# Patient Record
Sex: Female | Born: 1975
Health system: Southern US, Community
[De-identification: ages and names within clinical notes are randomized; demographics above are authoritative.]

## PROBLEM LIST (undated history)

## (undated) ENCOUNTER — Emergency Department (HOSPITAL_COMMUNITY): Admission: EM | Payer: Self-pay | Source: Home / Self Care

## (undated) DIAGNOSIS — Z8673 Personal history of transient ischemic attack (TIA), and cerebral infarction without residual deficits: Secondary | ICD-10-CM

## (undated) DIAGNOSIS — Z8679 Personal history of other diseases of the circulatory system: Secondary | ICD-10-CM

## (undated) DIAGNOSIS — E669 Obesity, unspecified: Secondary | ICD-10-CM

## (undated) DIAGNOSIS — D573 Sickle-cell trait: Secondary | ICD-10-CM

## (undated) DIAGNOSIS — I10 Essential (primary) hypertension: Secondary | ICD-10-CM

## (undated) DIAGNOSIS — E7800 Pure hypercholesterolemia, unspecified: Secondary | ICD-10-CM

## (undated) DIAGNOSIS — I639 Cerebral infarction, unspecified: Secondary | ICD-10-CM

## (undated) DIAGNOSIS — R32 Unspecified urinary incontinence: Secondary | ICD-10-CM

## (undated) HISTORY — DX: Personal history of transient ischemic attack (TIA), and cerebral infarction without residual deficits: Z86.73

## (undated) HISTORY — PX: TUBAL LIGATION: SHX77

## (undated) HISTORY — DX: Personal history of other diseases of the circulatory system: Z86.79

## (undated) HISTORY — DX: Unspecified urinary incontinence: R32

## (undated) HISTORY — PX: WISDOM TOOTH EXTRACTION: SHX21

## (undated) HISTORY — DX: Cerebral infarction, unspecified: I63.9

---

## 2000-11-08 ENCOUNTER — Emergency Department (HOSPITAL_COMMUNITY): Admission: EM | Admit: 2000-11-08 | Discharge: 2000-11-08 | Payer: Self-pay | Admitting: Emergency Medicine

## 2001-07-17 ENCOUNTER — Other Ambulatory Visit: Admission: RE | Admit: 2001-07-17 | Discharge: 2001-07-17 | Payer: Self-pay | Admitting: Gynecology

## 2005-07-01 ENCOUNTER — Emergency Department (HOSPITAL_COMMUNITY): Admission: EM | Admit: 2005-07-01 | Discharge: 2005-07-02 | Payer: Self-pay | Admitting: Emergency Medicine

## 2005-08-14 ENCOUNTER — Emergency Department (HOSPITAL_COMMUNITY): Admission: EM | Admit: 2005-08-14 | Discharge: 2005-08-14 | Payer: Self-pay | Admitting: Family Medicine

## 2006-02-12 ENCOUNTER — Emergency Department (HOSPITAL_COMMUNITY): Admission: EM | Admit: 2006-02-12 | Discharge: 2006-02-13 | Payer: Self-pay | Admitting: Emergency Medicine

## 2006-04-19 ENCOUNTER — Emergency Department (HOSPITAL_COMMUNITY): Admission: EM | Admit: 2006-04-19 | Discharge: 2006-04-19 | Payer: Self-pay | Admitting: Family Medicine

## 2006-05-12 ENCOUNTER — Emergency Department (HOSPITAL_COMMUNITY): Admission: EM | Admit: 2006-05-12 | Discharge: 2006-05-12 | Payer: Self-pay | Admitting: Emergency Medicine

## 2006-05-13 ENCOUNTER — Emergency Department (HOSPITAL_COMMUNITY): Admission: EM | Admit: 2006-05-13 | Discharge: 2006-05-13 | Payer: Self-pay | Admitting: Emergency Medicine

## 2006-12-01 ENCOUNTER — Other Ambulatory Visit: Admission: RE | Admit: 2006-12-01 | Discharge: 2006-12-01 | Payer: Self-pay | Admitting: Family Medicine

## 2007-07-29 ENCOUNTER — Emergency Department (HOSPITAL_COMMUNITY): Admission: EM | Admit: 2007-07-29 | Discharge: 2007-07-30 | Payer: Self-pay | Admitting: Emergency Medicine

## 2007-12-14 ENCOUNTER — Other Ambulatory Visit: Admission: RE | Admit: 2007-12-14 | Discharge: 2007-12-14 | Payer: Self-pay | Admitting: Family Medicine

## 2008-03-05 ENCOUNTER — Emergency Department (HOSPITAL_COMMUNITY): Admission: EM | Admit: 2008-03-05 | Discharge: 2008-03-05 | Payer: Self-pay | Admitting: Family Medicine

## 2008-10-06 ENCOUNTER — Emergency Department (HOSPITAL_COMMUNITY): Admission: EM | Admit: 2008-10-06 | Discharge: 2008-10-06 | Payer: Self-pay | Admitting: Family Medicine

## 2009-03-17 ENCOUNTER — Emergency Department (HOSPITAL_COMMUNITY): Admission: EM | Admit: 2009-03-17 | Discharge: 2009-03-17 | Payer: Self-pay | Admitting: Emergency Medicine

## 2010-03-19 ENCOUNTER — Emergency Department (HOSPITAL_COMMUNITY)
Admission: EM | Admit: 2010-03-19 | Discharge: 2010-03-19 | Disposition: A | Discharge status: Home / Self Care | Payer: Worker's Compensation | Attending: Emergency Medicine | Admitting: Emergency Medicine

## 2010-03-19 DIAGNOSIS — W261XXA Contact with sword or dagger, initial encounter: Secondary | ICD-10-CM | POA: Insufficient documentation

## 2010-03-19 DIAGNOSIS — S61209A Unspecified open wound of unspecified finger without damage to nail, initial encounter: Secondary | ICD-10-CM | POA: Insufficient documentation

## 2010-03-19 DIAGNOSIS — W260XXA Contact with knife, initial encounter: Secondary | ICD-10-CM | POA: Insufficient documentation

## 2010-03-19 DIAGNOSIS — D571 Sickle-cell disease without crisis: Secondary | ICD-10-CM | POA: Insufficient documentation

## 2010-03-19 DIAGNOSIS — Y9269 Other specified industrial and construction area as the place of occurrence of the external cause: Secondary | ICD-10-CM | POA: Insufficient documentation

## 2010-03-19 DIAGNOSIS — Y990 Civilian activity done for income or pay: Secondary | ICD-10-CM | POA: Insufficient documentation

## 2010-05-03 LAB — POCT I-STAT, CHEM 8
BUN: 7 mg/dL (ref 6–23)
Calcium, Ion: 1.15 mmol/L (ref 1.12–1.32)
Chloride: 106 mEq/L (ref 96–112)
Creatinine, Ser: 1 mg/dL (ref 0.4–1.2)
HCT: 40 % (ref 36.0–46.0)
Hemoglobin: 13.6 g/dL (ref 12.0–15.0)
Potassium: 3.5 mEq/L (ref 3.5–5.1)
Sodium: 139 mEq/L (ref 135–145)
TCO2: 26 mmol/L (ref 0–100)

## 2010-07-21 ENCOUNTER — Inpatient Hospital Stay (INDEPENDENT_AMBULATORY_CARE_PROVIDER_SITE_OTHER)
Admission: RE | Admit: 2010-07-21 | Discharge: 2010-07-21 | Disposition: A | Discharge status: Home / Self Care | Payer: Self-pay | Source: Ambulatory Visit | Attending: Family Medicine | Admitting: Family Medicine

## 2010-07-21 DIAGNOSIS — J069 Acute upper respiratory infection, unspecified: Secondary | ICD-10-CM

## 2010-07-21 DIAGNOSIS — J309 Allergic rhinitis, unspecified: Secondary | ICD-10-CM

## 2010-11-14 ENCOUNTER — Inpatient Hospital Stay (INDEPENDENT_AMBULATORY_CARE_PROVIDER_SITE_OTHER)
Admission: RE | Admit: 2010-11-14 | Discharge: 2010-11-14 | Disposition: A | Discharge status: Home / Self Care | Payer: Worker's Compensation | Source: Ambulatory Visit | Attending: Family Medicine | Admitting: Family Medicine

## 2010-11-14 ENCOUNTER — Ambulatory Visit (INDEPENDENT_AMBULATORY_CARE_PROVIDER_SITE_OTHER): Payer: Worker's Compensation

## 2010-11-14 DIAGNOSIS — M779 Enthesopathy, unspecified: Secondary | ICD-10-CM

## 2010-11-14 DIAGNOSIS — M79609 Pain in unspecified limb: Secondary | ICD-10-CM

## 2011-01-13 ENCOUNTER — Emergency Department (INDEPENDENT_AMBULATORY_CARE_PROVIDER_SITE_OTHER)
Admission: EM | Admit: 2011-01-13 | Discharge: 2011-01-13 | Disposition: A | Discharge status: Home / Self Care | Payer: BC Managed Care – PPO | Source: Home / Self Care | Attending: Family Medicine | Admitting: Family Medicine

## 2011-01-13 DIAGNOSIS — J069 Acute upper respiratory infection, unspecified: Secondary | ICD-10-CM

## 2011-01-13 MED ORDER — GUAIFENESIN-CODEINE 100-10 MG/5ML PO SYRP: 5.0000 mL | ORAL_SOLUTION | Freq: Four times a day (QID) | ORAL | Status: AC | PRN

## 2011-01-13 MED ORDER — AZITHROMYCIN 250 MG PO TABS: 250.0000 mg | ORAL_TABLET | Freq: Every day | ORAL | Status: AC

## 2011-01-13 NOTE — ED Notes (Signed)
C/o productive cough of yellow-green sputum, sore throat and runny nose for 2 weeks.  States she thinks she has had intermittent fever.

## 2011-01-13 NOTE — ED Provider Notes (Signed)
History     CSN: [id number] Arrival date & time: 01/13/2011  8:47 AM   First MD Initiated Contact with Patient 01/13/11 [phone]      Chief Complaint  Patient presents with  . Cough    (Consider location/radiation/quality/duration/timing/severity/associated sxs/prior treatment) HPI Comments: Belinda Ramirez presents for evaluation of persistent nasal congestion, rhinorrhea, and cough. She is unsure if she has had fever but does report feeling hot and then chills. She has tried Sudafed without relief.   Patient is a 35 y.o. female presenting with cough. The history is provided by the patient.  Cough This is a new problem. The current episode started more than 1 week ago. The problem occurs constantly. The problem has not changed since onset.The cough is productive of sputum. The maximum temperature recorded prior to her arrival was 100 to 100.9 F. The fever has been present for 5 days or more. Associated symptoms include chills, rhinorrhea, sore throat and myalgias. Pertinent negatives include no ear congestion, no ear pain and no eye redness. She has tried decongestants for the symptoms. The treatment provided no relief.    History reviewed. No pertinent past medical history.  Past Surgical History  Procedure Date  . Tubal ligation     No family history on file.  History  Substance Use Topics  . Smoking status: Never Smoker   . Smokeless tobacco: Not on file  . Alcohol Use: No    OB History    Grav Para Term Preterm Abortions TAB SAB Ect Mult Living                  Review of Systems  Constitutional: Positive for fever, chills and fatigue.  HENT: Positive for congestion, sore throat and rhinorrhea. Negative for ear pain and trouble swallowing.   Eyes: Negative.  Negative for redness.  Respiratory: Positive for cough.   Cardiovascular: Negative.   Gastrointestinal: Negative.   Genitourinary: Negative.   Musculoskeletal: Positive for myalgias.  Skin: Negative.      Allergies  Review of patient's allergies indicates no known allergies.  Home Medications   Current Outpatient Rx  Name Route Sig Dispense Refill  . TYLENOL PO Oral Take by mouth as needed.      . SUDAFED 12 HOUR PO Oral Take by mouth as needed.        BP 137/99  Pulse 77  Temp(Src) 98.9 F (37.2 C) (Oral)  Resp 18  SpO2 100%  LMP 12/30/2010  Physical Exam  Constitutional: She is oriented to person, place, and time. She appears well-developed and well-nourished.  HENT:  Head: Normocephalic and atraumatic.  Right Ear: Tympanic membrane and external ear normal.  Left Ear: Tympanic membrane and external ear normal.  Nose: Nose normal.  Mouth/Throat: Uvula is midline, oropharynx is clear and moist and mucous membranes are normal.  Eyes: EOM are normal. Pupils are equal, round, and reactive to light.  Neck: Normal range of motion.  Cardiovascular: Normal rate and regular rhythm.   Pulmonary/Chest: Effort normal and breath sounds normal.  Neurological: She is alert and oriented to person, place, and time.  Skin: Skin is warm and dry.    ED Course  Procedures (including critical care time)  Labs Reviewed - No data to display No results found.   No diagnosis found.    MDM          Marcell Anger, MD 01/13/11 [phone]

## 2011-05-23 ENCOUNTER — Emergency Department (INDEPENDENT_AMBULATORY_CARE_PROVIDER_SITE_OTHER)
Admission: EM | Admit: 2011-05-23 | Discharge: 2011-05-23 | Disposition: A | Discharge status: Home / Self Care | Payer: BC Managed Care – PPO | Source: Home / Self Care | Attending: Family Medicine | Admitting: Family Medicine

## 2011-05-23 ENCOUNTER — Encounter (HOSPITAL_COMMUNITY): Payer: Self-pay | Admitting: *Deleted

## 2011-05-23 DIAGNOSIS — R030 Elevated blood-pressure reading, without diagnosis of hypertension: Secondary | ICD-10-CM

## 2011-05-23 DIAGNOSIS — A0811 Acute gastroenteropathy due to Norwalk agent: Secondary | ICD-10-CM

## 2011-05-23 MED ORDER — ONDANSETRON 8 MG PO TBDP: 8.0000 mg | ORAL_TABLET | Freq: Three times a day (TID) | ORAL | Status: AC | PRN

## 2011-05-23 MED ORDER — BISMUTH SUBSALICYLATE 262 MG/15ML PO SUSP: 15.0000 mL | Freq: Four times a day (QID) | ORAL | Status: AC | PRN

## 2011-05-23 MED ORDER — ONDANSETRON 4 MG PO TBDP
ORAL_TABLET | ORAL | Status: AC
  Filled 2011-05-23: qty 2

## 2011-05-23 MED ORDER — RANITIDINE HCL 150 MG PO CAPS: 150.0000 mg | ORAL_CAPSULE | Freq: Every day | ORAL | Status: DC

## 2011-05-23 MED ORDER — DIPHENOXYLATE-ATROPINE 2.5-0.025 MG PO TABS: 1.0000 | ORAL_TABLET | Freq: Four times a day (QID) | ORAL | Status: AC | PRN

## 2011-05-23 MED ORDER — ONDANSETRON 4 MG PO TBDP
8.0000 mg | ORAL_TABLET | Freq: Once | ORAL | Status: AC
  Administered 2011-05-23: 8 mg via ORAL

## 2011-05-23 NOTE — ED Notes (Signed)
CO DIARRHEA, VOMITING LAST THIS AM AT 0800, DENIES FEVER , CO ABD CRAMPS

## 2011-05-23 NOTE — ED Provider Notes (Signed)
History     CSN: [id number]  Arrival date & time 05/23/11  1340   First MD Initiated Contact with Patient 05/23/11 1341      Chief Complaint  Patient presents with  . Diarrhea    (Consider location/radiation/quality/duration/timing/severity/associated sxs/prior treatment) Patient is a 36 y.o. female presenting with diarrhea. The history is provided by the patient.  Diarrhea The primary symptoms include fatigue, abdominal pain, nausea, vomiting, diarrhea, myalgias and arthralgias. Primary symptoms do not include fever, weight loss, melena, hematemesis, jaundice, hematochezia, dysuria or rash. The illness began 2 days ago. The problem has been gradually worsening.  The illness is also significant for chills and bloating. The illness does not include dysphagia, constipation, back pain or itching. Associated medical issues do not include inflammatory bowel disease, GERD, gallstones, liver disease, PUD, gastric bypass, bowel resection, irritable bowel syndrome, hemorrhoids or diverticulitis.    History reviewed. No pertinent past medical history.  Past Surgical History  Procedure Date  . Tubal ligation     History reviewed. No pertinent family history.  History  Substance Use Topics  . Smoking status: Never Smoker   . Smokeless tobacco: Not on file  . Alcohol Use: No    OB History    Grav Para Term Preterm Abortions TAB SAB Ect Mult Living                  Review of Systems  Constitutional: Positive for chills and fatigue. Negative for fever and weight loss.  Gastrointestinal: Positive for nausea, vomiting, abdominal pain, diarrhea and bloating. Negative for dysphagia, constipation, melena, hematochezia, hematemesis and jaundice.  Genitourinary: Negative for dysuria.  Musculoskeletal: Positive for myalgias and arthralgias. Negative for back pain.  Skin: Negative for itching and rash.    Allergies  Review of patient's allergies indicates no known allergies.  Home  Medications   Current Outpatient Rx  Name Route Sig Dispense Refill  . TYLENOL PO Oral Take by mouth as needed.      . SUDAFED 12 HOUR PO Oral Take by mouth as needed.        BP 174/94  Pulse 70  Temp(Src) 98.5 F (36.9 C) (Oral)  Resp 18  SpO2 98%  LMP 05/05/2011  Physical Exam  Constitutional: She is oriented to person, place, and time. She appears well-developed and well-nourished.       Obese BF  HENT:  Head: Normocephalic.  Cardiovascular: Normal rate and regular rhythm.   Pulmonary/Chest: Effort normal and breath sounds normal.  Abdominal: Soft. Bowel sounds are normal. She exhibits no distension and no mass. There is no tenderness. There is no rebound and no guarding.  Neurological: She is alert and oriented to person, place, and time. No cranial nerve deficit.  Skin: Skin is warm.  Psychiatric: She has a normal mood and affect.    ED Course  Procedures (including critical care time)  Gastroenteritis  noro virus zofran , lomotil, peptobismol w/zantac     MDM          Frederich Cha, MD 05/23/11 2122

## 2011-05-23 NOTE — Discharge Instructions (Signed)
Hypertension Information As your heart beats, it forces blood through your arteries. This force is your blood pressure. If the pressure is too high, it is called hypertension (HTN) or high blood pressure. HTN is dangerous because you may have it and not know it. High blood pressure may mean that your heart has to work harder to pump blood. Your arteries may be narrow or stiff. The extra work puts you at risk for heart disease, stroke, and other problems.  Blood pressure consists of two numbers, a higher number over a lower, 110/72, for example. It is stated as "110 over 72." The ideal is below 120 for the top number (systolic) and under 80 for the bottom (diastolic).  You should pay close attention to your blood pressure if you have certain conditions such as:  Heart failure.   Prior heart attack.   Diabetes   Chronic kidney disease.   Prior stroke.   Multiple risk factors for heart disease.  To see if you have HTN, your blood pressure should be measured while you are seated with your arm held at the level of the heart. It should be measured at least twice. A one-time elevated blood pressure reading (especially in the Emergency Department) does not mean that you need treatment. There may be conditions in which the blood pressure is different between your right and left arms. It is important to see your caregiver soon for a recheck. Most people have essential hypertension which means that there is not a specific cause. This type of high blood pressure may be lowered by changing lifestyle factors such as:  Stress.   Smoking.   Lack of exercise.   Excessive weight.   Drug/tobacco/alcohol use.   Eating less salt.  Most people do not have symptoms from high blood pressure until it has caused damage to the body. Effective treatment can often prevent, delay or reduce that damage. TREATMENT  Treatment for high blood pressure, when a cause has been identified, is directed at the cause. There  are a large number of medications to treat HTN. These fall into several categories, and your caregiver will help you select the medicines that are best for you. Medications may have side effects. You should review side effects with your caregiver. If your blood pressure stays high after you have made lifestyle changes or started on medicines,   Your medication(s) may need to be changed.   Other problems may need to be addressed.   Be certain you understand your prescriptions, and know how and when to take your medicine.   Be sure to follow up with your caregiver within the time frame advised (usually within two weeks) to have your blood pressure rechecked and to review your medications.   If you are taking more than one medicine to lower your blood pressure, make sure you know how and at what times they should be taken. Taking two medicines at the same time can result in blood pressure that is too low.  Document Released: 04/06/2005 Document Revised: 10/14/2010 Document Reviewed: 04/13/2007 [facility] Patient Information 2012 [place].Viral Gastroenteritis Viral gastroenteritis is also called stomach flu. This illness is caused by a certain type of germ (virus). It can cause sudden watery poop (diarrhea) and throwing up (vomiting). This can cause you to lose body fluids (dehydration). This illness usually lasts for 3 to 8 days. It usually goes away on its own. HOME CARE   Drink enough fluids to keep your pee (urine) clear or pale yellow.  Drink small amounts of fluids often.   Ask your doctor how to replace body fluid losses (rehydration).   Avoid:   Foods high in sugar.   Alcohol.   Bubbly (carbonated) drinks.   Tobacco.   Juice.   Caffeine drinks.   Very hot or cold fluids.   Fatty, greasy foods.   Eating too much at one time.   Dairy products until 24 to 48 hours after your watery poop stops.   You may eat foods with active cultures (probiotics). They can be found in  some yogurts and supplements.   Wash your hands well to avoid spreading the illness.   Only take medicines as told by your doctor. Do not give aspirin to children. Do not take medicines for watery poop (antidiarrheals).   Ask your doctor if you should keep taking your regular medicines.   Keep all doctor visits as told.  GET HELP RIGHT AWAY IF:   You cannot keep fluids down.   You do not pee at least once every 6 to 8 hours.   You are short of breath.   You see blood in your poop or throw up. This may look like coffee grounds.   You have belly (abdominal) pain that gets worse or is just in one small spot (localized).   You keep throwing up or having watery poop.   You have a fever.   The patient is a child younger than 3 months, and he or she has a fever.   The patient is a child older than 3 months, and he or she has a fever and problems that do not go away.   The patient is a child older than 3 months, and he or she has a fever and problems that suddenly get worse.   The patient is a baby, and he or she has no tears when crying.  MAKE SURE YOU:   Understand these instructions.   Will watch your condition.   Will get help right away if you are not doing well or get worse.  Document Released: 07/21/2007 Document Revised: 01/21/2011 Document Reviewed: 11/18/2010 [facility] Patient Information 2012 [place].Norovirus Infection Norovirus illness is caused by a viral infection. The term norovirus refers to a group of viruses. Any of those viruses can cause norovirus illness. This illness is often referred to by other names such as viral gastroenteritis, stomach flu, and food poisoning. Anyone can get a norovirus infection. People can have the illness multiple times during their lifetime. CAUSES  Norovirus is found in the stool or vomit of infected people. It is easily spread from person to person (contagious). People with norovirus are contagious from the moment they begin  feeling ill. They may remain contagious for as long as 3 days to 2 weeks after recovery. People can become infected with the virus in several ways. This includes:  Eating food or drinking liquids that are contaminated with norovirus.   Touching surfaces or objects contaminated with norovirus, and then placing your hand in your mouth.   Having direct contact with a person who is infected and shows symptoms. This may occur while caring for someone with illness or while sharing foods or eating utensils with someone who is ill.  SYMPTOMS  Symptoms usually begin 1 to 2 days after ingestion of the virus. Symptoms may include:  Nausea.   Vomiting.   Diarrhea.   Stomach cramps.   Low-grade fever.   Chills.   Headache.   Muscle aches.  Tiredness.  Most people with norovirus illness get better within 1 to 2 days. Some people become dehydrated because they cannot drink enough liquids to replace those lost from vomiting and diarrhea. This is especially true for young children, the elderly, and others who are unable to care for themselves. DIAGNOSIS  Diagnosis is based on your symptoms and exam. Currently, only state public health laboratories have the ability to test for norovirus in stool or vomit. TREATMENT  No specific treatment exists for norovirus infections. No vaccine is available to prevent infections. Norovirus illness is usually brief in healthy people. If you are ill with vomiting and diarrhea, you should drink enough water and fluids to keep your urine clear or pale yellow. Dehydration is the most serious health effect that can result from this infection. By drinking oral rehydration solution (ORS), people can reduce their chance of becoming dehydrated. There are many commercially available pre-made and powdered ORS designed to safely rehydrate people. These may be recommended by your caregiver. Replace any new fluid losses from diarrhea or vomiting with ORS as follows:  If your  child weighs 10 kg or less (22 lb or less), give 60 to 120 ml ( to  cup or 2 to 4 oz) of ORS for each diarrheal stool or vomiting episode.   If your child weighs more than 10 kg (more than 22 lb), give 120 to 240 ml ( to 1 cup or 4 to 8 oz) of ORS for each diarrheal stool or vomiting episode.  HOME CARE INSTRUCTIONS   Follow all your caregiver's instructions.   Avoid sugar-free and alcoholic drinks while ill.   Only take over-the-counter or prescription medicines for pain, vomiting, diarrhea, or fever as directed by your caregiver.  You can decrease your chances of coming in contact with norovirus or spreading it by following these steps:  Frequently wash your hands, especially after using the toilet, changing diapers, and before eating or preparing food.   Carefully wash fruits and vegetables. Cook shellfish before eating them.   Do not prepare food for others while you are infected and for at least 3 days after recovering from illness.   Thoroughly clean and disinfect contaminated surfaces immediately after an episode of illness using a bleach-based household cleaner.   Immediately remove and wash clothing or linens that may be contaminated with the virus.   Use the toilet to dispose of any vomit or stool. Make sure the surrounding area is kept clean.   Food that may have been contaminated by an ill person should be discarded.  SEEK IMMEDIATE MEDICAL CARE IF:   You develop symptoms of dehydration that do not improve with fluid replacement. This may include:   Excessive sleepiness.   Lack of tears.   Dry mouth.   Dizziness when standing.   Weak pulse.  Document Released: 04/24/2002 Document Revised: 01/21/2011 Document Reviewed: 05/26/2009 [facility] Patient Information 2012 [place].

## 2011-11-22 ENCOUNTER — Encounter (HOSPITAL_COMMUNITY): Payer: Self-pay | Admitting: Emergency Medicine

## 2011-11-22 ENCOUNTER — Emergency Department (HOSPITAL_COMMUNITY): Payer: BC Managed Care – PPO

## 2011-11-22 ENCOUNTER — Emergency Department (HOSPITAL_COMMUNITY)
Admission: EM | Admit: 2011-11-22 | Discharge: 2011-11-22 | Disposition: A | Discharge status: Home / Self Care | Payer: BC Managed Care – PPO | Attending: Emergency Medicine | Admitting: Emergency Medicine

## 2011-11-22 DIAGNOSIS — I10 Essential (primary) hypertension: Secondary | ICD-10-CM | POA: Insufficient documentation

## 2011-11-22 DIAGNOSIS — R42 Dizziness and giddiness: Secondary | ICD-10-CM | POA: Insufficient documentation

## 2011-11-22 DIAGNOSIS — I517 Cardiomegaly: Secondary | ICD-10-CM | POA: Insufficient documentation

## 2011-11-22 DIAGNOSIS — R0602 Shortness of breath: Secondary | ICD-10-CM | POA: Insufficient documentation

## 2011-11-22 DIAGNOSIS — Z79899 Other long term (current) drug therapy: Secondary | ICD-10-CM | POA: Insufficient documentation

## 2011-11-22 DIAGNOSIS — R079 Chest pain, unspecified: Secondary | ICD-10-CM | POA: Insufficient documentation

## 2011-11-22 HISTORY — DX: Essential (primary) hypertension: I10

## 2011-11-22 HISTORY — DX: Sickle-cell trait: D57.3

## 2011-11-22 HISTORY — DX: Pure hypercholesterolemia, unspecified: E78.00

## 2011-11-22 LAB — BASIC METABOLIC PANEL
CO2: 26 mEq/L (ref 19–32)
Creatinine, Ser: 0.88 mg/dL (ref 0.50–1.10)
Glucose, Bld: 93 mg/dL (ref 70–99)
Potassium: 3.9 mEq/L (ref 3.5–5.1)
Sodium: 138 mEq/L (ref 135–145)

## 2011-11-22 LAB — CBC
Hemoglobin: 12.5 g/dL (ref 12.0–15.0)
MCH: 26.3 pg (ref 26.0–34.0)

## 2011-11-22 LAB — POCT I-STAT TROPONIN I: Troponin i, poc: 0 ng/mL (ref 0.00–0.08)

## 2011-11-22 MED ORDER — ASPIRIN 81 MG PO CHEW
324.0000 mg | CHEWABLE_TABLET | Freq: Once | ORAL | Status: AC
  Administered 2011-11-22: 324 mg via ORAL
  Filled 2011-11-22: qty 4

## 2011-11-22 NOTE — ED Provider Notes (Signed)
History     CSN: [id number]  Arrival date & time 11/22/11  48   First MD Initiated Contact with Patient 11/22/11 1940      Chief Complaint  Patient presents with  . Chest Pain     Patient is a 36 y.o. female presenting with chest pain. The history is provided by the patient.  Chest Pain Episode onset: several weeks ago. Episode Length: around a minute. Chest pain occurs intermittently. Associated with: nothing. The quality of the pain is described as sharp ("like pins"). The pain does not radiate. Chest pain is worsened by certain positions. Primary symptoms include shortness of breath and dizziness. Pertinent negatives for primary symptoms include no fever, no syncope, no cough, no abdominal pain and no vomiting.  Dizziness does not occur with vomiting or diaphoresis.  Pertinent negatives for associated symptoms include no diaphoresis.   pt reports she has been having intermittent episodes of CP for past several weeks She reports they feel "like pins and sharp" They are around a minute in duration They are at times positional Not pleuritic No exertional pain is reported She does report some mild dizziness but no syncope No exertional weakness/SOB is reported  No h/o CAD No h/o DVT/PE No recent travel/surgery  Currently she is pain free    Past Medical History  Diagnosis Date  . Hypertension   . High cholesterol   . Sickle cell trait     Past Surgical History  Procedure Date  . Tubal ligation     Family history - CAD in family members  History  Substance Use Topics  . Smoking status: Never Smoker   . Smokeless tobacco: Not on file  . Alcohol Use: Yes     occaisonal    OB History    Grav Para Term Preterm Abortions TAB SAB Ect Mult Living                  Review of Systems  Constitutional: Negative for fever and diaphoresis.  Respiratory: Positive for shortness of breath. Negative for cough.   Cardiovascular: Positive for chest pain. Negative for  syncope.  Gastrointestinal: Negative for vomiting and abdominal pain.  Neurological: Positive for dizziness.  All other systems reviewed and are negative.    Allergies  Review of patient's allergies indicates no known allergies.  Home Medications   Current Outpatient Rx  Name Route Sig Dispense Refill  . HYDROCHLOROTHIAZIDE 25 MG PO TABS Oral Take 25 mg by mouth daily.      BP 179/92  Pulse 71  Temp 98.9 F (37.2 C) (Oral)  Resp 20  SpO2 100%  LMP 11/08/2011  Physical Exam CONSTITUTIONAL: Well developed/well nourished HEAD AND FACE: Normocephalic/atraumatic EYES: EOMI/PERRL ENMT: Mucous membranes moist NECK: supple no meningeal signs SPINE:entire spine nontender CV: S1/S2 noted, no murmurs/rubs/gallops noted Chest -nontender, no crepitance LUNGS: Lungs are clear to auscultation bilaterally, no apparent distress ABDOMEN: soft, nontender, no rebound or guarding GU:no cva tenderness NEURO: Pt is awake/alert, moves all extremitiesx4 EXTREMITIES: pulses normal, full ROM, no edema and no calf tenderness SKIN: warm, color normal PSYCH: no abnormalities of mood noted  ED Course  Procedures   Labs Reviewed  CBC - Abnormal; Notable for the following:    MCV 76.8 (*)     RDW 16.1 (*)     All other components within normal limits  BASIC METABOLIC PANEL - Abnormal; Notable for the following:    GFR calc non Af Amer 82 (*)  All other components within normal limits  POCT I-STAT TROPONIN I  POCT I-STAT TROPONIN I   Dg Chest 2 View  11/22/2011  *RADIOLOGY REPORT*  Clinical Data: Chest pain  CHEST - 2 VIEW  Comparison: None.  Findings: Moderate cardiomegaly.  Bibasilar subsegmental atelectasis.  Pulmonary vascularity within normal limits.  No pneumothorax or pleural effusion.  No sign of pulmonary edema.  IMPRESSION: Cardiomegaly without pulmonary edema.   Original Report Authenticated By: Jamas Lav, M.D.    Pt well appearing no distress. HEART Score less than 3 and  two negative troponins.  She had atypical CP story.  I ambulated patient and she had no recurrent chest pain/weakness I doubt ACS/PE/Dissection.  Cardiomegaly noted though unlikely acute, doubt pericarditis/myocarditis/effusion.  She is hypertensive and suspect that may be cause of her mild dizziness (but no weakness, no vertigo reported, no syncope) She needs better BP control and referred back to her PCP this week for close followup.   Discussed strict return precautions and she was agreeable.  This includes worsened/severe chest pain with shortness of breath over next 24-48 hours.    MDM  Nursing notes including past medical history and social history reviewed and considered in documentation xrays reviewed and considered Labs/vital reviewed and considered     Date: 11/22/2011 1536  Rate: 84  Rhythm: normal sinus rhythm  QRS Axis: normal  Intervals: normal  ST/T Wave abnormalities: nonspecific ST changes  Conduction Disutrbances:none       Date: 11/22/2011 2045  Rate: 69  Rhythm: normal sinus rhythm  QRS Axis: normal  Intervals: normal  ST/T Wave abnormalities: normal  Conduction Disutrbances:none     Sharyon Cable, MD 11/23/11 0002

## 2011-11-22 NOTE — ED Notes (Signed)
Clean catch urine sample collected.  No orders for urine at this time.

## 2011-11-22 NOTE — ED Notes (Signed)
Pt c/o mid chest pain st"s feels like a pin sticking her.  St's she had same pain 2 weeks ago and it got better but returned 2 days ago.  Pt st's pain increases if she yells.  Pt denies any nausea or vomiting.

## 2012-06-08 ENCOUNTER — Encounter (HOSPITAL_COMMUNITY): Payer: Self-pay | Admitting: *Deleted

## 2012-06-08 ENCOUNTER — Emergency Department (INDEPENDENT_AMBULATORY_CARE_PROVIDER_SITE_OTHER)
Admission: EM | Admit: 2012-06-08 | Discharge: 2012-06-08 | Disposition: A | Discharge status: Home / Self Care | Payer: BC Managed Care – PPO | Source: Home / Self Care | Attending: Emergency Medicine | Admitting: Emergency Medicine

## 2012-06-08 DIAGNOSIS — J019 Acute sinusitis, unspecified: Secondary | ICD-10-CM

## 2012-06-08 DIAGNOSIS — J209 Acute bronchitis, unspecified: Secondary | ICD-10-CM

## 2012-06-08 MED ORDER — AMOXICILLIN 500 MG PO CAPS: 1000.0000 mg | ORAL_CAPSULE | Freq: Three times a day (TID) | ORAL | Status: DC

## 2012-06-08 MED ORDER — ALBUTEROL SULFATE HFA 108 (90 BASE) MCG/ACT IN AERS: 1.0000 | INHALATION_SPRAY | Freq: Four times a day (QID) | RESPIRATORY_TRACT | Status: DC | PRN

## 2012-06-08 MED ORDER — FLUTICASONE PROPIONATE 50 MCG/ACT NA SUSP: 2.0000 | Freq: Every day | NASAL | Status: DC

## 2012-06-08 MED ORDER — BENZONATATE 200 MG PO CAPS: 200.0000 mg | ORAL_CAPSULE | Freq: Three times a day (TID) | ORAL | Status: DC | PRN

## 2012-06-08 NOTE — ED Provider Notes (Signed)
Chief Complaint:   Chief Complaint  Patient presents with  . Cough    History of Present Illness:   Belinda Ramirez is a 37 year old female who has had a two-week history of nasal congestion with yellow drainage, headache, sinus pressure, ringing in her ear is, cough productive yellow sputum, wheezing, chest pain, sweating, and sore throat. She denies fever, chills, or GI symptoms.  Review of Systems:  Other than noted above, the patient denies any of the following symptoms: Systemic:  No fevers, chills, sweats, weight loss or gain, fatigue, or tiredness. Eye:  No redness or discharge. ENT:  No ear pain, drainage, headache, nasal congestion, drainage, sinus pressure, difficulty swallowing, or sore throat. Neck:  No neck pain or swollen glands. Lungs:  No cough, sputum production, hemoptysis, wheezing, chest tightness, shortness of breath or chest pain. GI:  No abdominal pain, nausea, vomiting or diarrhea.  [place]:  Past medical history, family history, social history, meds, and allergies were reviewed. She has high blood pressure and takes medication for this.  Physical Exam:   Vital signs:  BP 180/98  Pulse 72  Temp(Src) 98.6 F (37 C) (Oral)  Resp 20  SpO2 100%  LMP 05/16/2012 General:  Alert and oriented.  In no distress.  Skin warm and dry. Eye:  No conjunctival injection or drainage. Lids were normal. ENT:  TMs and canals were normal, without erythema or inflammation.  Nasal mucosa was clear and uncongested, without drainage.  Mucous membranes were moist.  Pharynx was clear with no exudate or drainage.  There were no oral ulcerations or lesions. Neck:  Supple, no adenopathy, tenderness or mass. Lungs:  No respiratory distress.  Lungs were clear to auscultation, without wheezes, rales or rhonchi.  Breath sounds were clear and equal bilaterally.  Heart:  Regular rhythm, without gallops, murmers or rubs. Skin:  Clear, warm, and dry, without rash or lesions.   Assessment:  The  primary encounter diagnosis was Acute sinusitis. A diagnosis of Acute bronchitis was also pertinent to this visit.  Plan:   1.  The following meds were prescribed:   Discharge Medication List as of 06/08/2012  5:15 PM    START taking these medications   Details  albuterol (PROVENTIL HFA;VENTOLIN HFA) 108 (90 BASE) MCG/ACT inhaler Inhale 1-2 puffs into the lungs every 6 (six) hours as needed for wheezing., Starting 06/08/2012, Until Discontinued, Normal    amoxicillin (AMOXIL) 500 MG capsule Take 2 capsules (1,000 mg total) by mouth 3 (three) times daily., Starting 06/08/2012, Until Discontinued, Normal    benzonatate (TESSALON) 200 MG capsule Take 1 capsule (200 mg total) by mouth 3 (three) times daily as needed for cough., Starting 06/08/2012, Until Discontinued, Normal    fluticasone (FLONASE) 50 MCG/ACT nasal spray Place 2 sprays into the nose daily., Starting 06/08/2012, Until Discontinued, Normal       2.  The patient was instructed in symptomatic care and handouts were given. 3.  The patient was told to return if becoming worse in any way, if no better in 3 or 4 days, and given some red flag symptoms such as worsening pain, difficulty breathing, or high fever that would indicate earlier return.      Harden Mo, MD 06/08/12 2003

## 2012-06-08 NOTE — ED Notes (Signed)
Pt   Has  Symptoms    Of    Sinus  Congestion  With   Sinus  Drainage         Stuffy  Nose   X  2  Weeks              She  Reports  Symptoms  Not  releived  By otc  meds

## 2012-10-23 ENCOUNTER — Encounter (HOSPITAL_COMMUNITY): Payer: Self-pay | Admitting: Emergency Medicine

## 2012-10-23 ENCOUNTER — Emergency Department (HOSPITAL_COMMUNITY): Payer: BC Managed Care – PPO

## 2012-10-23 ENCOUNTER — Emergency Department (HOSPITAL_COMMUNITY)
Admission: EM | Admit: 2012-10-23 | Discharge: 2012-10-24 | Disposition: A | Discharge status: Home / Self Care | Payer: BC Managed Care – PPO | Attending: Emergency Medicine | Admitting: Emergency Medicine

## 2012-10-23 ENCOUNTER — Emergency Department (INDEPENDENT_AMBULATORY_CARE_PROVIDER_SITE_OTHER)
Admission: EM | Admit: 2012-10-23 | Discharge: 2012-10-23 | Disposition: A | Discharge status: Nursing Home (Includes ICF) | Payer: BC Managed Care – PPO | Source: Home / Self Care | Attending: Emergency Medicine | Admitting: Emergency Medicine

## 2012-10-23 ENCOUNTER — Emergency Department (INDEPENDENT_AMBULATORY_CARE_PROVIDER_SITE_OTHER): Payer: BC Managed Care – PPO

## 2012-10-23 ENCOUNTER — Encounter (HOSPITAL_COMMUNITY): Payer: Self-pay | Admitting: Nurse Practitioner

## 2012-10-23 DIAGNOSIS — Z3202 Encounter for pregnancy test, result negative: Secondary | ICD-10-CM | POA: Insufficient documentation

## 2012-10-23 DIAGNOSIS — R0789 Other chest pain: Secondary | ICD-10-CM

## 2012-10-23 DIAGNOSIS — R7989 Other specified abnormal findings of blood chemistry: Secondary | ICD-10-CM

## 2012-10-23 DIAGNOSIS — R799 Abnormal finding of blood chemistry, unspecified: Secondary | ICD-10-CM | POA: Insufficient documentation

## 2012-10-23 DIAGNOSIS — Z8639 Personal history of other endocrine, nutritional and metabolic disease: Secondary | ICD-10-CM | POA: Insufficient documentation

## 2012-10-23 DIAGNOSIS — Z79899 Other long term (current) drug therapy: Secondary | ICD-10-CM | POA: Insufficient documentation

## 2012-10-23 DIAGNOSIS — R079 Chest pain, unspecified: Secondary | ICD-10-CM

## 2012-10-23 DIAGNOSIS — Z862 Personal history of diseases of the blood and blood-forming organs and certain disorders involving the immune mechanism: Secondary | ICD-10-CM | POA: Insufficient documentation

## 2012-10-23 DIAGNOSIS — M7989 Other specified soft tissue disorders: Secondary | ICD-10-CM | POA: Insufficient documentation

## 2012-10-23 DIAGNOSIS — I10 Essential (primary) hypertension: Secondary | ICD-10-CM | POA: Insufficient documentation

## 2012-10-23 LAB — D-DIMER, QUANTITATIVE: D-Dimer, Quant: 0.54 ug/mL-FEU — ABNORMAL HIGH (ref 0.00–0.48)

## 2012-10-23 MED ORDER — IOHEXOL 350 MG/ML SOLN
100.0000 mL | Freq: Once | INTRAVENOUS | Status: AC | PRN
  Administered 2012-10-23: 100 mL via INTRAVENOUS

## 2012-10-23 NOTE — ED Provider Notes (Signed)
Chief Complaint:   Chief Complaint  Patient presents with  . Chest Pain    x2 days.     History of Present Illness:   Belinda Ramirez is a 37 year old female with high blood pressure who has experienced a two-month history of intermittent episodes of chest pain. The pains are substernal, feels like pins sticking in her chest and lasts from 15-20 minutes. They're brought on by heavy lifting and made better by massaging her chest. She denies any associated nausea, diaphoresis, or shortness of breath. She has felt a little bit of dizziness. She denies any syncope. She's had no fever, chills, URI symptoms, coughing, or wheezing. She denies any palpitations or syncope. She's had no abdominal pain. She denies any leg pain, swelling, or history of phlebitis or pulmonary embolism. She has high blood pressure which is poorly controlled on her current meds which just consisted of hydrochlorothiazide. She denies any history of cigarette smoking, hypercholesterolemia, diabetes, or family history of heart disease, blood clots, phlebitis, or pulmonary embolism. She's had no recent prolonged car or train trips or plane flights.  Review of Systems:  Other than noted above, the patient denies any of the following symptoms. Systemic:  No fever, chills, sweats, or fatigue. ENT:  No nasal congestion, rhinorrhea, or sore throat. Pulmonary:  No cough, wheezing, shortness of breath, sputum production, hemoptysis. Cardiac:  No palpitations, rapid heartbeat, dizziness, presyncope or syncope. GI:  No abdominal pain, heartburn, nausea, or vomiting. Ext:  No leg pain or swelling.  [place]:  Past medical history, family history, social history, meds, and allergies were reviewed and updated as needed. She takes hydrochlorothiazide for high blood pressure  Physical Exam:   Vital signs:  BP 170/95  Pulse 78  Temp(Src) 99.9 F (37.7 C) (Oral)  Resp 18  SpO2 99%  LMP 10/22/2012 Gen:  Alert, oriented, in no distress, skin  warm and dry. Eye:  PERRL, lids and conjunctivas normal.  Sclera non-icteric. ENT:  Mucous membranes moist, pharynx clear. Neck:  Supple, no adenopathy or tenderness.  No JVD. Lungs:  Clear to auscultation, no wheezes, rales or rhonchi.  No respiratory distress. Heart:  Regular rhythm.  No gallops, murmers, clicks or rubs. Chest:  There is moderate chest wall tenderness to palpation. Abdomen:  Soft, nontender, no organomegaly or mass.  Bowel sounds normal.  No pulsatile abdominal mass or bruit. Ext:  No edema.  No calf tenderness and Homann's sign negative.  Pulses full and equal. Skin:  Warm and dry.  No rash.  Labs:   Results for orders placed during the hospital encounter of 10/23/12  D-DIMER, QUANTITATIVE      Result Value Range   D-Dimer, Quant 0.54 (*) 0.00 - 0.48 ug/mL-FEU     Radiology:  Dg Chest 2 View  10/23/2012   *RADIOLOGY REPORT*  Clinical Data: Substernal chest pain  CHEST - 2 VIEW  Comparison: 11/22/2011  Findings: Right infrahilar band-like density compatible with atelectasis versus scarring.  Mild cardiomegaly without CHF or pneumonia.  No effusion or pneumothorax.  Trachea midline.  Overall stable exam.  IMPRESSION: Cardiomegaly without CHF or pneumonia.  Right midlung atelectasis versus scarring.   Original Report Authenticated By: Jerilynn Mages. Annamaria Boots, M.D.   I reviewed the images independently and personally and concur with the radiologist's findings.  EKG:   Date: 10/23/2012  Rate: 74  Rhythm: normal sinus rhythm  QRS Axis: normal  Intervals: normal  ST/T Wave abnormalities: normal  Conduction Disutrbances:none  Narrative Interpretation: Normal sinus rhythm, poor R  wave progression across precordial leads, but no QS complexes, ST segment elevation or depression or T-wave inversion suggestive of acute cardiac ischemia.  Old EKG Reviewed: none available  Assessment:  The encounter diagnosis was Chest pain.  Chest pain, atelectasis, and mildly elevated d-dimer are suggestive  of pulmonary embolism. Differential diagnosis includes musculoskeletal chest pain including costochondritis.  Plan:  The patient was transferred to the ED via shuttle in stable condition.  Medical Decision Making:  37 year old female with HT has a 2 month history of recurrent substernal chest pain, not pleuritic.  No SOB or hemoptysis.  EKG was normal except for poor R progression.  CXR shows cardiomegaily and atelectasis on right.  Her D-dimer was pos at 0.54.  She is being transferred because of suspicion of pulmonary embolism.      Harden Mo, MD 10/23/12 [id number]

## 2012-10-23 NOTE — ED Notes (Signed)
2247  Pt ambulatory to the restroom.  Feeling better than she did when she arrived.

## 2012-10-23 NOTE — ED Notes (Signed)
Pt sent from [facility] for r/o PE. She has been having cp x 2 months and her ddimer was elevated today
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
## 2012-10-23 NOTE — ED Notes (Signed)
C/o chest pain x 2 days. Pain more severe/burning sensation with bending over when trying to lift items. "feels like I have to burp but cant" Pt has tried gas meds with no relief in symptoms. Denies sob or any other symptoms.

## 2012-10-24 MED ORDER — HYDROCODONE-ACETAMINOPHEN 5-325 MG PO TABS: 1.0000 | ORAL_TABLET | ORAL | Status: DC | PRN

## 2012-10-24 MED ORDER — OMEPRAZOLE 20 MG PO CPDR: 20.0000 mg | DELAYED_RELEASE_CAPSULE | Freq: Every day | ORAL | Status: DC

## 2012-10-26 NOTE — ED Provider Notes (Signed)
Medical screening examination/treatment/procedure(s) were performed by non-physician practitioner and as supervising physician I was immediately available for consultation/collaboration.   Alfonzo Feller, DO 10/26/12 2245

## 2012-12-13 ENCOUNTER — Emergency Department (HOSPITAL_COMMUNITY)
Admission: EM | Admit: 2012-12-13 | Discharge: 2012-12-13 | Disposition: A | Discharge status: Home / Self Care | Payer: BC Managed Care – PPO | Attending: Emergency Medicine | Admitting: Emergency Medicine

## 2012-12-13 ENCOUNTER — Encounter (HOSPITAL_COMMUNITY): Payer: Self-pay | Admitting: Emergency Medicine

## 2012-12-13 DIAGNOSIS — Z7982 Long term (current) use of aspirin: Secondary | ICD-10-CM | POA: Insufficient documentation

## 2012-12-13 DIAGNOSIS — Z79899 Other long term (current) drug therapy: Secondary | ICD-10-CM | POA: Insufficient documentation

## 2012-12-13 DIAGNOSIS — E669 Obesity, unspecified: Secondary | ICD-10-CM | POA: Insufficient documentation

## 2012-12-13 DIAGNOSIS — I10 Essential (primary) hypertension: Secondary | ICD-10-CM | POA: Insufficient documentation

## 2012-12-13 DIAGNOSIS — Z791 Long term (current) use of non-steroidal anti-inflammatories (NSAID): Secondary | ICD-10-CM | POA: Insufficient documentation

## 2012-12-13 DIAGNOSIS — I8390 Asymptomatic varicose veins of unspecified lower extremity: Secondary | ICD-10-CM

## 2012-12-13 DIAGNOSIS — D573 Sickle-cell trait: Secondary | ICD-10-CM | POA: Insufficient documentation

## 2012-12-13 DIAGNOSIS — E7800 Pure hypercholesterolemia, unspecified: Secondary | ICD-10-CM | POA: Insufficient documentation
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
## 2012-12-13 NOTE — Progress Notes (Signed)
VASCULAR LAB PRELIMINARY  PRELIMINARY  PRELIMINARY  PRELIMINARY  Carotid duplex completed.    Preliminary report:  Left:  No evidence of DVT, superficial thrombosis, or Baker's cyst.  Laurelyn Terrero, RVS 12/13/2012, 12:51 PM

## 2012-12-13 NOTE — ED Provider Notes (Signed)
CSN: [id number]     Arrival date & time 12/13/12  1004 History   First MD Initiated Contact with Patient 12/13/12 1016     Chief Complaint  Patient presents with  . Leg Pain    HPI Pt has had swelling off and on in her left leg for 3 weeks.  She would notice more swelling when standing a lot.  Pt works for [employer] and she also works at night at [employer].  Last night it started hurting more and she noticed some swelling.  She can also see a swollen blood vessel on the surface.  She went to an urgent care who sent her here. She denies any shortness of breath. She is not having any chest pain. She denies any fevers or rashes. Past Medical History  Diagnosis Date  . Hypertension   . High cholesterol   . Sickle cell trait    Past Surgical History  Procedure Laterality Date  . Tubal ligation     History reviewed. No pertinent family history. except history of blood clots in the extended family History  Substance Use Topics  . Smoking status: Never Smoker   . Smokeless tobacco: Not on file  . Alcohol Use: No     Comment: occaisonal   OB History   Grav Para Term Preterm Abortions TAB SAB Ect Mult Living                 Review of Systems  All other systems reviewed and are negative.    Allergies  Review of patient's allergies indicates no known allergies.  Home Medications   Current Outpatient Rx  Name  Route  Sig  Dispense  Refill  . aspirin 325 MG tablet   Oral   Take 325 mg by mouth daily.         Marland Kitchen esomeprazole (NEXIUM) 20 MG capsule   Oral   Take 20 mg by mouth daily before breakfast.         . hydrochlorothiazide (HYDRODIURIL) 25 MG tablet   Oral   Take 25 mg by mouth daily.         . naproxen sodium (ANAPROX) 220 MG tablet   Oral   Take 220 mg by mouth 2 (two) times daily with a meal.          BP 195/96  Pulse 70  Temp(Src) 98.8 F (37.1 C) (Oral)  Resp 18  Ht 5\' 5"  (1.651 m)  Wt 295 lb (133.811 kg)  BMI 49.09 kg/m2  SpO2  100%  LMP 12/11/2012 Physical Exam  Nursing note and vitals reviewed. Constitutional: She appears well-developed and well-nourished. No distress.  Obese  HENT:  Head: Normocephalic and atraumatic.  Right Ear: External ear normal.  Left Ear: External ear normal.  Eyes: Conjunctivae are normal. Right eye exhibits no discharge. Left eye exhibits no discharge. No scleral icterus.  Neck: Neck supple. No tracheal deviation present.  Cardiovascular: Normal rate, regular rhythm and intact distal pulses.   Pulmonary/Chest: Effort normal and breath sounds normal. No stridor. No respiratory distress. She has no wheezes. She has no rales.  Abdominal: Soft. Bowel sounds are normal. She exhibits no distension. There is no tenderness. There is no rebound and no guarding.  Musculoskeletal: She exhibits no edema and no tenderness.  No appreciable edema, soft and palpable varicose veins in the surface of her left lower extremity above and below the knee, no areas of erythema, mild tenderness when palpated that correlates with  the patient's areas of discomfort  Neurological: She is alert. She has normal strength. No sensory deficit. Cranial nerve deficit:  no gross defecits noted. She exhibits normal muscle tone. She displays no seizure activity. Coordination normal.  Skin: Skin is warm and dry. No rash noted.  Psychiatric: She has a normal mood and affect.    ED Course  Procedures (including critical care time) Labs Review Labs Reviewed - No data to display Imaging Review No results found.  EKG Interpretation   None       MDM   1. Varicose veins     Symptoms are most likely related to her varicose veins. While here in the emergency department, I will get a vascular ultrasound to rule out deep venous thrombosis  The Doppler study was negative for DVT. I recommended the patient followup with her primary doctor and consider seeing a vascular surgeon regarding possible surgical treatment for her  symptomatic varicose veins    Kathalene Frames, MD 12/13/12 1323

## 2013-05-01 ENCOUNTER — Emergency Department (HOSPITAL_COMMUNITY)
Admission: EM | Admit: 2013-05-01 | Discharge: 2013-05-01 | Disposition: A | Discharge status: Home / Self Care | Payer: BC Managed Care – PPO | Source: Home / Self Care

## 2013-05-01 ENCOUNTER — Encounter (HOSPITAL_COMMUNITY): Payer: Self-pay | Admitting: Emergency Medicine

## 2013-05-01 DIAGNOSIS — A084 Viral intestinal infection, unspecified: Secondary | ICD-10-CM

## 2013-05-01 DIAGNOSIS — I10 Essential (primary) hypertension: Secondary | ICD-10-CM

## 2013-05-01 DIAGNOSIS — A088 Other specified intestinal infections: Secondary | ICD-10-CM

## 2013-05-01 LAB — POCT URINALYSIS DIP (DEVICE)
Bilirubin Urine: NEGATIVE
GLUCOSE, UA: NEGATIVE mg/dL
Ketones, ur: NEGATIVE mg/dL
NITRITE: NEGATIVE
PROTEIN: 100 mg/dL — AB
Specific Gravity, Urine: 1.015 (ref 1.005–1.030)
UROBILINOGEN UA: 0.2 mg/dL (ref 0.0–1.0)
pH: 7.5 (ref 5.0–8.0)

## 2013-05-01 LAB — POCT PREGNANCY, URINE: PREG TEST UR: NEGATIVE

## 2013-05-01 MED ORDER — ONDANSETRON HCL 4 MG PO TABS: 4.0000 mg | ORAL_TABLET | Freq: Four times a day (QID) | ORAL | Status: DC

## 2013-05-01 NOTE — ED Notes (Signed)
C/o nausea and diarrhea for two days now Denies any vomiting  OTC antidiarrhea and pepto mix Tubes are tied

## 2013-05-01 NOTE — ED Provider Notes (Signed)
Medical screening examination/treatment/procedure(s) were performed by resident physician or non-physician practitioner and as supervising physician I was immediately available for consultation/collaboration.   Pauline Good MD.   Billy Fischer, MD 05/01/13 [phone]

## 2013-05-01 NOTE — ED Provider Notes (Signed)
CSN: [id number]     Arrival date & time 05/01/13  [id number] History   None    Chief Complaint  Patient presents with  . Nausea  . Diarrhea   (Consider location/radiation/quality/duration/timing/severity/associated sxs/prior Treatment) HPI Comments: C/O of nausea x 2 d, no vomiting and several small watery stools/d x 2 d. Minor abdominal discomfort, no pain. No fever, chills, chest pain, headache, fever.    Past Medical History  Diagnosis Date  . Hypertension   . High cholesterol   . Sickle cell trait    Past Surgical History  Procedure Laterality Date  . Tubal ligation     History reviewed. No pertinent family history. History  Substance Use Topics  . Smoking status: Never Smoker   . Smokeless tobacco: Not on file  . Alcohol Use: No     Comment: occaisonal   OB History   Grav Para Term Preterm Abortions TAB SAB Ect Mult Living                 Review of Systems  Constitutional: Positive for activity change and appetite change. Negative for fever.  HENT: Negative.   Respiratory: Negative.   Cardiovascular: Negative.   Gastrointestinal: Positive for nausea and diarrhea. Negative for vomiting, constipation and abdominal distention.  Genitourinary: Negative.   Musculoskeletal: Negative.   Skin: Negative.   Neurological: Negative.     Allergies  Oxycodone  Home Medications   Current Outpatient Rx  Name  Route  Sig  Dispense  Refill  . aspirin 325 MG tablet   Oral   Take 325 mg by mouth daily.         Marland Kitchen esomeprazole (NEXIUM) 20 MG capsule   Oral   Take 20 mg by mouth daily before breakfast.         . hydrochlorothiazide (HYDRODIURIL) 25 MG tablet   Oral   Take 25 mg by mouth daily.         . naproxen sodium (ANAPROX) 220 MG tablet   Oral   Take 220 mg by mouth 2 (two) times daily with a meal.         . ondansetron (ZOFRAN) 4 MG tablet   Oral   Take 1 tablet (4 mg total) by mouth every 6 (six) hours.   12 tablet   0    BP 206/120  Pulse 86   Temp(Src) 98.2 F (36.8 C) (Oral)  Resp 20  SpO2 95%  LMP 03/21/2013 Physical Exam  Nursing note and vitals reviewed. Constitutional: She is oriented to person, place, and time. She appears well-developed and well-nourished. No distress.  HENT:  Head: Normocephalic.  Mouth/Throat: Oropharynx is clear and moist. No oropharyngeal exudate.  Eyes: Conjunctivae and EOM are normal.  Neck: Normal range of motion. Neck supple.  Cardiovascular: Normal rate, normal heart sounds and intact distal pulses.   Pulmonary/Chest: Effort normal. No respiratory distress.  Abdominal: Soft. Bowel sounds are normal. She exhibits no distension and no mass. There is no rebound and no guarding.  Minor tenderness left lateral abdomen.  Musculoskeletal: She exhibits no edema.  Lymphadenopathy:    She has no cervical adenopathy.  Neurological: She is alert and oriented to person, place, and time.  Skin: Skin is warm and dry.  Psychiatric: She has a normal mood and affect.    ED Course  Procedures (including critical care time) Labs Review Labs Reviewed  POCT URINALYSIS DIP (DEVICE) - Abnormal; Notable for the following:    Hgb urine dipstick TRACE (*)  Protein, ur 100 (*)    Leukocytes, UA TRACE (*)    All other components within normal limits  POCT PREGNANCY, URINE   Imaging Review No results found.   MDM   1. Viral gastroenteritis   2. HTN (hypertension)   3. Morbid obesity    Zofran, diet instructions. Pedialyte. See your PCP tomorrow re BP.     Janne Napoleon, NP 05/01/13 [phone]

## 2013-05-01 NOTE — Discharge Instructions (Signed)
Diet for Diarrhea, Adult Frequent, runny stools (diarrhea) may be caused or worsened by food or drink. Diarrhea may be relieved by changing your diet. Since diarrhea can last up to 7 days, it is easy for you to lose too much fluid from the body and become dehydrated. Fluids that are lost need to be replaced. Along with a modified diet, make sure you drink enough fluids to keep your urine clear or pale yellow. DIET INSTRUCTIONS  Ensure adequate fluid intake (hydration): have 1 cup (8 oz) of fluid for each diarrhea episode. Avoid fluids that contain simple sugars or sports drinks, fruit juices, whole milk products, and sodas. Your urine should be clear or pale yellow if you are drinking enough fluids. Hydrate with an oral rehydration solution that you can purchase at pharmacies, retail stores, and online. You can prepare an oral rehydration solution at home by mixing the following ingredients together:    tsp table salt.   tsp baking soda.   tsp salt substitute containing potassium chloride.  1  tablespoons sugar.  1 L (34 oz) of water.  Certain foods and beverages may increase the speed at which food moves through the gastrointestinal (GI) tract. These foods and beverages should be avoided and include:  Caffeinated and alcoholic beverages.  High-fiber foods, such as raw fruits and vegetables, nuts, seeds, and whole grain breads and cereals.  Foods and beverages sweetened with sugar alcohols, such as xylitol, sorbitol, and mannitol.  Some foods may be well tolerated and may help thicken stool including:  Starchy foods, such as rice, toast, pasta, low-sugar cereal, oatmeal, grits, baked potatoes, crackers, and bagels.   Bananas.   Applesauce.  Add probiotic-rich foods to help increase healthy bacteria in the GI tract, such as yogurt and fermented milk products. RECOMMENDED FOODS AND BEVERAGES Starches Choose foods with less than 2 g of fiber per serving.  Recommended:  White,  [place], and pita breads, plain rolls, buns, bagels. Plain muffins, matzo. Soda, saltine, or graham crackers. Pretzels, melba toast, zwieback. Cooked cereals made with water: cornmeal, farina, cream cereals. Dry cereals: refined corn, wheat, rice. Potatoes prepared any way without skins, refined macaroni, spaghetti, noodles, refined rice.  Avoid:  Bread, rolls, or crackers made with whole wheat, multi-grains, rye, bran seeds, nuts, or coconut. Corn tortillas or taco shells. Cereals containing whole grains, multi-grains, bran, coconut, nuts, raisins. Cooked or dry oatmeal. Coarse wheat cereals, granola. Cereals advertised as "high-fiber." Potato skins. Whole grain pasta, wild or brown rice. Popcorn. Sweet potatoes, yams. Sweet rolls, doughnuts, waffles, pancakes, sweet breads. Vegetables  Recommended: Strained tomato and vegetable juices. Most well-cooked and canned vegetables without seeds. Fresh: Tender lettuce, cucumber without the skin, cabbage, spinach, bean sprouts.  Avoid: Fresh, cooked, or canned: Artichokes, baked beans, beet greens, broccoli, Brussels sprouts, corn, kale, legumes, peas, sweet potatoes. Cooked: Green or red cabbage, spinach. Avoid large servings of any vegetables because vegetables shrink when cooked, and they contain more fiber per serving than fresh vegetables. Fruit  Recommended: Cooked or canned: Apricots, applesauce, cantaloupe, cherries, fruit cocktail, grapefruit, grapes, kiwi, mandarin oranges, peaches, pears, plums, watermelon. Fresh: Apples without skin, ripe banana, grapes, cantaloupe, cherries, grapefruit, peaches, oranges, plums. Keep servings limited to  cup or 1 piece.  Avoid: Fresh: Apples with skin, apricots, mangoes, pears, raspberries, strawberries. Prune juice, stewed or dried prunes. Dried fruits, raisins, dates. Large servings of all fresh fruits. Protein  Recommended: Ground or well-cooked tender beef, ham, veal, lamb, pork, or poultry. Eggs. Fish,  oysters, shrimp,  lobster, other seafoods. Liver, organ meats.  Avoid: Tough, fibrous meats with gristle. Peanut butter, smooth or chunky. Cheese, nuts, seeds, legumes, dried peas, beans, lentils. Dairy  Recommended: Yogurt, lactose-free milk, kefir, drinkable yogurt, buttermilk, soy milk, or plain hard cheese.  Avoid: Milk, chocolate milk, beverages made with milk, such as milkshakes. Soups  Recommended: Bouillon, broth, or soups made from allowed foods. Any strained soup.  Avoid: Soups made from vegetables that are not allowed, cream or milk-based soups. Desserts and Sweets  Recommended: Sugar-free gelatin, sugar-free frozen ice pops made without sugar alcohol.  Avoid: Plain cakes and cookies, pie made with fruit, pudding, custard, cream pie. Gelatin, fruit, ice, sherbet, frozen ice pops. Ice cream, ice milk without nuts. Plain hard candy, honey, jelly, molasses, syrup, sugar, chocolate syrup, gumdrops, marshmallows. Fats and Oils  Recommended: Limit fats to less than 8 tsp per day.  Avoid: Seeds, nuts, olives, avocados. Margarine, butter, cream, mayonnaise, salad oils, plain salad dressings. Plain gravy, crisp bacon without rind. Beverages  Recommended: Water, decaffeinated teas, oral rehydration solutions, sugar-free beverages not sweetened with sugar alcohols.  Avoid: Fruit juices, caffeinated beverages (coffee, tea, soda), alcohol, sports drinks, or lemon-lime soda. Condiments  Recommended: Ketchup, mustard, horseradish, vinegar, cocoa powder. Spices in moderation: allspice, basil, bay leaves, celery powder or leaves, cinnamon, cumin powder, curry powder, ginger, mace, marjoram, onion or garlic powder, oregano, paprika, parsley flakes, ground pepper, rosemary, sage, savory, tarragon, thyme, turmeric.  Avoid: Coconut, honey. Document Released: 04/24/2003 Document Revised: 10/27/2011 Document Reviewed: 06/18/2011 [facility] Patient Information 2014 [place].  Viral  Gastroenteritis Viral gastroenteritis is also known as stomach flu. This condition affects the stomach and intestinal tract. It can cause sudden diarrhea and vomiting. The illness typically lasts 3 to 8 days. Most people develop an immune response that eventually gets rid of the virus. While this natural response develops, the virus can make you quite ill. CAUSES  Many different viruses can cause gastroenteritis, such as rotavirus or noroviruses. You can catch one of these viruses by consuming contaminated food or water. You may also catch a virus by sharing utensils or other personal items with an infected person or by touching a contaminated surface. SYMPTOMS  The most common symptoms are diarrhea and vomiting. These problems can cause a severe loss of body fluids (dehydration) and a body salt (electrolyte) imbalance. Other symptoms may include:  Fever.  Headache.  Fatigue.  Abdominal pain. DIAGNOSIS  Your caregiver can usually diagnose viral gastroenteritis based on your symptoms and a physical exam. A stool sample may also be taken to test for the presence of viruses or other infections. TREATMENT  This illness typically goes away on its own. Treatments are aimed at rehydration. The most serious cases of viral gastroenteritis involve vomiting so severely that you are not able to keep fluids down. In these cases, fluids must be given through an intravenous line (IV). HOME CARE INSTRUCTIONS   Drink enough fluids to keep your urine clear or pale yellow. Drink small amounts of fluids frequently and increase the amounts as tolerated.  Ask your caregiver for specific rehydration instructions.  Avoid:  Foods high in sugar.  Alcohol.  Carbonated drinks.  Tobacco.  Juice.  Caffeine drinks.  Extremely hot or cold fluids.  Fatty, greasy foods.  Too much intake of anything at one time.  Dairy products until 24 to 48 hours after diarrhea stops.  You may consume probiotics.  Probiotics are active cultures of beneficial bacteria. They may lessen the amount and number  of diarrheal stools in adults. Probiotics can be found in yogurt with active cultures and in supplements.  Wash your hands well to avoid spreading the virus.  Only take over-the-counter or prescription medicines for pain, discomfort, or fever as directed by your caregiver. Do not give aspirin to children. Antidiarrheal medicines are not recommended.  Ask your caregiver if you should continue to take your regular prescribed and over-the-counter medicines.  Keep all follow-up appointments as directed by your caregiver. SEEK IMMEDIATE MEDICAL CARE IF:   You are unable to keep fluids down.  You do not urinate at least once every 6 to 8 hours.  You develop shortness of breath.  You notice blood in your stool or vomit. This may look like coffee grounds.  You have abdominal pain that increases or is concentrated in one small area (localized).  You have persistent vomiting or diarrhea.  You have a fever.  The patient is a child younger than 3 months, and he or she has a fever.  The patient is a child older than 3 months, and he or she has a fever and persistent symptoms.  The patient is a child older than 3 months, and he or she has a fever and symptoms suddenly get worse.  The patient is a baby, and he or she has no tears when crying. MAKE SURE YOU:   Understand these instructions.  Will watch your condition.  Will get help right away if you are not doing well or get worse. Document Released: 02/01/2005 Document Revised: 04/26/2011 Document Reviewed: 11/18/2010 [facility] Patient Information 2014 [place].  Managing Your High Blood Pressure Blood pressure is a measurement of how forceful your blood is pressing against the walls of the arteries. Arteries are muscular tubes within the circulatory system. Blood pressure does not stay the same. Blood pressure rises when you are active,  excited, or nervous; and it lowers during sleep and relaxation. If the numbers measuring your blood pressure stay above normal most of the time, you are at risk for health problems. High blood pressure (hypertension) is a long-term (chronic) condition in which blood pressure is elevated. A blood pressure reading is recorded as two numbers, such as 120 over 80 (or 120/80). The first, higher number is called the systolic pressure. It is a measure of the pressure in your arteries as the heart beats. The second, lower number is called the diastolic pressure. It is a measure of the pressure in your arteries as the heart relaxes between beats.  Keeping your blood pressure in a normal range is important to your overall health and prevention of health problems, such as heart disease and stroke. When your blood pressure is uncontrolled, your heart has to work harder than normal. High blood pressure is a very common condition in adults because blood pressure tends to rise with age. Men and women are equally likely to have hypertension but at different times in life. Before age 53, men are more likely to have hypertension. After 40 years of age, women are more likely to have it. Hypertension is especially common in African Americans. This condition often has no signs or symptoms. The cause of the condition is usually not known. Your caregiver can help you come up with a plan to keep your blood pressure in a normal, healthy range. BLOOD PRESSURE STAGES Blood pressure is classified into four stages: normal, prehypertension, stage 1, and stage 2. Your blood pressure reading will be used to determine what type of treatment, if  any, is necessary. Appropriate treatment options are tied to these four stages:  Normal  Systolic pressure (mm Hg): below 120.  Diastolic pressure (mm Hg): below 80. Prehypertension  Systolic pressure (mm Hg): 120 to 139.  Diastolic pressure (mm Hg): 80 to 89. Stage1  Systolic pressure (mm  Hg): 140 to 159.  Diastolic pressure (mm Hg): 90 to 99. Stage2  Systolic pressure (mm Hg): 160 or above.  Diastolic pressure (mm Hg): 100 or above. RISKS RELATED TO HIGH BLOOD PRESSURE Managing your blood pressure is an important responsibility. Uncontrolled high blood pressure can lead to:  A heart attack.  A stroke.  A weakened blood vessel (aneurysm).  Heart failure.  Kidney damage.  Eye damage.  Metabolic syndrome.  Memory and concentration problems. HOW TO MANAGE YOUR BLOOD PRESSURE Blood pressure can be managed effectively with lifestyle changes and medicines (if needed). Your caregiver will help you come up with a plan to bring your blood pressure within a normal range. Your plan should include the following: Education  Read all information provided by your caregivers about how to control blood pressure.  Educate yourself on the latest guidelines and treatment recommendations. New research is always being done to further define the risks and treatments for high blood pressure. Lifestylechanges  Control your weight.  Avoid smoking.  Stay physically active.  Reduce the amount of salt in your diet.  Reduce stress.  Control any chronic conditions, such as high cholesterol or diabetes.  Reduce your alcohol intake. Medicines  Several medicines (antihypertensive medicines) are available, if needed, to bring blood pressure within a normal range. Communication  Review all the medicines you take with your caregiver because there may be side effects or interactions.  Talk with your caregiver about your diet, exercise habits, and other lifestyle factors that may be contributing to high blood pressure.  See your caregiver regularly. Your caregiver can help you create and adjust your plan for managing high blood pressure. RECOMMENDATIONS FOR TREATMENT AND FOLLOW-UP  The following recommendations are based on current guidelines for managing high blood pressure in  nonpregnant adults. Use these recommendations to identify the proper follow-up period or treatment option based on your blood pressure reading. You can discuss these options with your caregiver.  Systolic pressure of 123456 to [id number] or diastolic pressure of 80 to 89: Follow up with your caregiver as directed.  Systolic pressure of [id number] to 0000000 or diastolic pressure of 90 to 100: Follow up with your caregiver within 2 months.  Systolic pressure above 0000000 or diastolic pressure above 123XX123: Follow up with your caregiver within 1 month.  Systolic pressure above [id number] or diastolic pressure above A999333: Consider antihypertensive therapy; follow up with your caregiver within 1 week.  Systolic pressure above A999333 or diastolic pressure above 123456: Begin antihypertensive therapy; follow up with your caregiver within 1 week. Document Released: 10/27/2011 Document Reviewed: 10/27/2011 [facility] Patient Information 2014 [place], [place].

## 2013-07-16 ENCOUNTER — Emergency Department (HOSPITAL_COMMUNITY): Payer: BC Managed Care – PPO

## 2013-07-16 ENCOUNTER — Emergency Department (HOSPITAL_COMMUNITY)
Admission: EM | Admit: 2013-07-16 | Discharge: 2013-07-16 | Disposition: A | Discharge status: Home / Self Care | Payer: BC Managed Care – PPO | Attending: Emergency Medicine | Admitting: Emergency Medicine

## 2013-07-16 ENCOUNTER — Encounter (HOSPITAL_COMMUNITY): Payer: Self-pay | Admitting: Emergency Medicine

## 2013-07-16 DIAGNOSIS — D573 Sickle-cell trait: Secondary | ICD-10-CM | POA: Insufficient documentation

## 2013-07-16 DIAGNOSIS — S59901A Unspecified injury of right elbow, initial encounter: Secondary | ICD-10-CM

## 2013-07-16 DIAGNOSIS — S6990XA Unspecified injury of unspecified wrist, hand and finger(s), initial encounter: Principal | ICD-10-CM

## 2013-07-16 DIAGNOSIS — S59919A Unspecified injury of unspecified forearm, initial encounter: Principal | ICD-10-CM

## 2013-07-16 DIAGNOSIS — Y9389 Activity, other specified: Secondary | ICD-10-CM | POA: Insufficient documentation

## 2013-07-16 DIAGNOSIS — IMO0002 Reserved for concepts with insufficient information to code with codable children: Secondary | ICD-10-CM | POA: Insufficient documentation

## 2013-07-16 DIAGNOSIS — Z79899 Other long term (current) drug therapy: Secondary | ICD-10-CM | POA: Insufficient documentation

## 2013-07-16 DIAGNOSIS — Y92009 Unspecified place in unspecified non-institutional (private) residence as the place of occurrence of the external cause: Secondary | ICD-10-CM | POA: Insufficient documentation

## 2013-07-16 DIAGNOSIS — Z791 Long term (current) use of non-steroidal anti-inflammatories (NSAID): Secondary | ICD-10-CM | POA: Insufficient documentation

## 2013-07-16 DIAGNOSIS — I10 Essential (primary) hypertension: Secondary | ICD-10-CM | POA: Insufficient documentation

## 2013-07-16 DIAGNOSIS — S59909A Unspecified injury of unspecified elbow, initial encounter: Secondary | ICD-10-CM | POA: Insufficient documentation

## 2013-07-16 DIAGNOSIS — Z7982 Long term (current) use of aspirin: Secondary | ICD-10-CM | POA: Insufficient documentation

## 2013-07-16 MED ORDER — IBUPROFEN 800 MG PO TABS
800.0000 mg | ORAL_TABLET | Freq: Once | ORAL | Status: AC
  Administered 2013-07-16: 800 mg via ORAL
  Filled 2013-07-16: qty 1

## 2013-07-16 MED ORDER — IBUPROFEN 800 MG PO TABS: 800.0000 mg | ORAL_TABLET | Freq: Three times a day (TID) | ORAL | Status: DC

## 2013-07-16 NOTE — ED Notes (Signed)
Per pt sts she was working in the yard about 3 days ago and hit right arm on a poll. sts swollen and painful.

## 2013-07-16 NOTE — ED Provider Notes (Signed)
CSN: [id number]     Arrival date & time 07/16/13  [id number] History   First MD Initiated Contact with Patient 07/16/13 0740     Chief Complaint  Patient presents with  . Arm Pain     (Consider location/radiation/quality/duration/timing/severity/associated sxs/prior Treatment) HPI  38 year old female with history of sickle cell trait, hypertension presents for evaluations of right arm injury. Patient reports 2 days ago she was working the yard, accidentally swung her arm and hitting her right elbow against a pole. She developed acute onset of pain to elbow in which she described as "pin sensation" which occasionally radiates up to her right shoulder. Pain is intermittent worsened with palpation. She felt that her elbow is swollen and painful. No report of fever or rash. No numbness. Denies any active shoulder or wrist pain. She is right-handed. She takes ibuprofen on occasional for pain which provide some relief. No other treatment tried.  Past Medical History  Diagnosis Date  . Hypertension   . High cholesterol   . Sickle cell trait    Past Surgical History  Procedure Laterality Date  . Tubal ligation     History reviewed. No pertinent family history. History  Substance Use Topics  . Smoking status: Never Smoker   . Smokeless tobacco: Not on file  . Alcohol Use: No     Comment: occaisonal   OB History   Grav Para Term Preterm Abortions TAB SAB Ect Mult Living                 Review of Systems  Constitutional: Negative for fever.  Musculoskeletal: Positive for arthralgias.  Skin: Negative for rash and wound.  Neurological: Negative for numbness.      Allergies  Oxycodone  Home Medications   Prior to Admission medications   Medication Sig Start Date End Date Taking? Authorizing Provider  aspirin 325 MG tablet Take 325 mg by mouth daily.    Historical Provider, MD  esomeprazole (NEXIUM) 20 MG capsule Take 20 mg by mouth daily before breakfast.    Historical Provider, MD   hydrochlorothiazide (HYDRODIURIL) 25 MG tablet Take 25 mg by mouth daily.    Historical Provider, MD  naproxen sodium (ANAPROX) 220 MG tablet Take 220 mg by mouth 2 (two) times daily with a meal.    Historical Provider, MD  ondansetron (ZOFRAN) 4 MG tablet Take 1 tablet (4 mg total) by mouth every 6 (six) hours. 05/01/13   Janne Napoleon, NP   BP 191/108  Pulse 87  Temp(Src) 98.2 F (36.8 C)  Resp 18  SpO2 96%  LMP 07/02/2013 Physical Exam  Nursing note and vitals reviewed. Constitutional: She appears well-developed and well-nourished. No distress.  HENT:  Head: Atraumatic.  Eyes: Conjunctivae are normal.  Neck: Neck supple.  Cardiovascular: Intact distal pulses.   Musculoskeletal: She exhibits tenderness (R elbow: point tenderness to lateral epicondyl to palpation. normal flexion/extension/pronation/supination.  no swelling, redness, bruise. no fb.  R shoulder and R wrist with FROM, nontender.  ). She exhibits no edema.  Neurological: She is alert.  Skin: No rash noted.  Psychiatric: She has a normal mood and affect.    ED Course  Procedures (including critical care time)  7:57 AM Pt with low impact R elbow injury, likely a small contusion.  Has FROM, no deformity.  She is NVI.  Pt however request xray to r/o acute fx, xray order.  Doubt infection, doubt fb.    9:09 AM Xray without acute fx/dislocation. Reassurance given.  RICE  therapy discussed.  Pt stable for discharge with NSAIDs. Pt made aware her BP is high today and will to have it recheck by her PCP.  Labs Review Labs Reviewed - No data to display  Imaging Review Dg Elbow Complete Right  07/16/2013   CLINICAL DATA:  Pain post trauma  EXAM: RIGHT ELBOW - COMPLETE 3+ VIEW  COMPARISON:  May 12, 2006  FINDINGS: Frontal, lateral, and bilateral oblique views were obtained. There is evidence suggesting old trauma in the region of the coronoid process of the proximal ulna. There is no acute fracture or dislocation. No joint  effusion. There is no appreciable joint space narrowing or erosive change.  IMPRESSION: Evidence of old trauma in the region of the coronoid process of the proximal ulna with remodeling. No acute fracture or dislocation. No joint effusion demonstrable.   Electronically Signed   By: Lowella Grip M.D.   On: 07/16/2013 08:20     EKG Interpretation None      MDM   Final diagnoses:  Injury of right elbow    BP 191/108  Pulse 87  Temp(Src) 98.2 F (36.8 C)  Resp 18  SpO2 96%  LMP 07/02/2013  I have reviewed nursing notes and vital signs. I personally reviewed the imaging tests through PACS system  I reviewed available ER/hospitalization records thought the EMR     Domenic Moras, [place] 07/16/13 [id number]

## 2013-07-16 NOTE — Discharge Instructions (Signed)

## 2013-07-16 NOTE — ED Provider Notes (Signed)
Medical screening examination/treatment/procedure(s) were performed by non-physician practitioner and as supervising physician I was immediately available for consultation/collaboration.   EKG Interpretation None        Merryl Hacker, MD 07/16/13 [phone]

## 2013-12-24 ENCOUNTER — Encounter (HOSPITAL_COMMUNITY): Payer: Self-pay

## 2013-12-24 ENCOUNTER — Emergency Department (INDEPENDENT_AMBULATORY_CARE_PROVIDER_SITE_OTHER)
Admission: EM | Admit: 2013-12-24 | Discharge: 2013-12-24 | Disposition: A | Discharge status: Home / Self Care | Payer: Self-pay | Source: Home / Self Care | Attending: Family Medicine | Admitting: Family Medicine

## 2013-12-24 DIAGNOSIS — R05 Cough: Secondary | ICD-10-CM

## 2013-12-24 DIAGNOSIS — R059 Cough, unspecified: Secondary | ICD-10-CM

## 2013-12-24 MED ORDER — PREDNISONE 10 MG PO TABS: ORAL_TABLET | ORAL | Status: DC

## 2013-12-24 MED ORDER — ALBUTEROL SULFATE HFA 108 (90 BASE) MCG/ACT IN AERS: 1.0000 | INHALATION_SPRAY | Freq: Four times a day (QID) | RESPIRATORY_TRACT | Status: DC | PRN

## 2013-12-24 MED ORDER — BENZONATATE 100 MG PO CAPS: 100.0000 mg | ORAL_CAPSULE | Freq: Three times a day (TID) | ORAL | Status: DC | PRN

## 2013-12-24 NOTE — ED Notes (Signed)
C/o 2 week duration of non-productive cough. Minimal relief w mucinex. Works in Ambulance person w [place]

## 2013-12-24 NOTE — Discharge Instructions (Signed)
Cough, Adult  A cough is a reflex that helps clear your throat and airways. It can help heal the body or may be a reaction to an irritated airway. A cough may only last 2 or 3 weeks (acute) or may last more than 8 weeks (chronic).  CAUSES Acute cough:  Viral or bacterial infections. Chronic cough:  Infections.  Allergies.  Asthma.  Post-nasal drip.  Smoking.  Heartburn or acid reflux.  Some medicines.  Chronic lung problems (COPD).  Cancer. SYMPTOMS   Cough.  Fever.  Chest pain.  Increased breathing rate.  High-pitched whistling sound when breathing (wheezing).  Colored mucus that you cough up (sputum). TREATMENT   A bacterial cough may be treated with antibiotic medicine.  A viral cough must run its course and will not respond to antibiotics.  Your caregiver may recommend other treatments if you have a chronic cough. HOME CARE INSTRUCTIONS   Only take over-the-counter or prescription medicines for pain, discomfort, or fever as directed by your caregiver. Use cough suppressants only as directed by your caregiver.  Use a cold steam vaporizer or humidifier in your bedroom or home to help loosen secretions.  Sleep in a semi-upright position if your cough is worse at night.  Rest as needed.  Stop smoking if you smoke. SEEK IMMEDIATE MEDICAL CARE IF:   You have pus in your sputum.  Your cough starts to worsen.  You cannot control your cough with suppressants and are losing sleep.  You begin coughing up blood.  You have difficulty breathing.  You develop pain which is getting worse or is uncontrolled with medicine.  You have a fever. MAKE SURE YOU:   Understand these instructions.  Will watch your condition.  Will get help right away if you are not doing well or get worse. Document Released: 07/31/2010 Document Revised: 04/26/2011 Document Reviewed: 07/31/2010 Exit[employer] Patient Information 2015 [employer]. This information is not intended  to replace advice given to you by your health care provider. Make sure you discuss any questions you have with your health care provider.  

## 2013-12-24 NOTE — ED Provider Notes (Signed)
CSN: [id number]     Arrival date & time 12/24/13  [id number] History   First MD Initiated Contact with Patient 12/24/13 [id number]     Chief Complaint  Patient presents with  . Cough   (Consider location/radiation/quality/duration/timing/severity/associated sxs/prior Treatment) HPI Comments: States symptoms began as common cold and reports that now she just has dry, non-productive, persistent cough No fever, CP, dyspnea, hemoptysis, weight loss or night sweats.  Non-smoker  Patient is a 38 y.o. female presenting with URI. The history is provided by the patient.  URI Presenting symptoms: cough   Presenting symptoms: no congestion, no ear pain, no facial pain, no fatigue, no fever, no rhinorrhea and no sore throat   Severity:  Moderate Onset quality:  Gradual Duration:  2 weeks Chronicity:  New   Past Medical History  Diagnosis Date  . Hypertension   . High cholesterol   . Sickle cell trait    Past Surgical History  Procedure Laterality Date  . Tubal ligation     History reviewed. No pertinent family history. History  Substance Use Topics  . Smoking status: Never Smoker   . Smokeless tobacco: Not on file  . Alcohol Use: No     Comment: occaisonal   OB History    No data available     Review of Systems  Constitutional: Negative for fever and fatigue.  HENT: Negative for congestion, ear pain, rhinorrhea and sore throat.   Respiratory: Positive for cough.   All other systems reviewed and are negative.   Allergies  Latex; Oxycodone; and Percocet  Home Medications   Prior to Admission medications   Medication Sig Start Date End Date Taking? Authorizing Provider  hydrochlorothiazide (HYDRODIURIL) 25 MG tablet Take 25 mg by mouth daily.   Yes Historical Provider, MD  albuterol (PROVENTIL HFA;VENTOLIN HFA) 108 (90 BASE) MCG/ACT inhaler Inhale 1-2 puffs into the lungs every 6 (six) hours as needed for wheezing or shortness of breath (or persistent coughing). 12/24/13   Audelia Hives Darrol Brandenburg, PA  aspirin 325 MG tablet Take 325 mg by mouth daily.    Historical Provider, MD  benzonatate (TESSALON) 100 MG capsule Take 1 capsule (100 mg total) by mouth 3 (three) times daily as needed for cough. 12/24/13   Audelia Hives Basma Buchner, PA  esomeprazole (NEXIUM) 20 MG capsule Take 20 mg by mouth daily before breakfast.    Historical Provider, MD  ibuprofen (ADVIL,MOTRIN) 800 MG tablet Take 1 tablet (800 mg total) by mouth 3 (three) times daily. 07/16/13   Domenic Moras, PA-C  Multiple Vitamin (MULTIVITAMIN WITH MINERALS) TABS tablet Take 1 tablet by mouth daily.    Historical Provider, MD  predniSONE (DELTASONE) 10 MG tablet Take 5 tabs po QD day 1, 4 tabs po QD day 2, 3 tabs po QD day 3, 2 tabs po QD day 4, 1 tab po QD day 5 then stop 12/24/13   Annett Gula H Jameson Morrow, PA   BP 159/91 mmHg  Pulse 91  Temp(Src) 98.8 F (37.1 C) (Oral)  Resp 16  SpO2 97% Physical Exam  Constitutional: She is oriented to person, place, and time. She appears well-developed and well-nourished. No distress.  HENT:  Head: Normocephalic and atraumatic.  Right Ear: Hearing, tympanic membrane, external ear and ear canal normal.  Left Ear: Hearing, tympanic membrane, external ear and ear canal normal.  Nose: Nose normal.  Mouth/Throat: Uvula is midline, oropharynx is clear and moist and mucous membranes are normal.  Eyes: Conjunctivae are normal. No  scleral icterus.  Neck: Normal range of motion. Neck supple.  Cardiovascular: Normal rate, regular rhythm and normal heart sounds.   Pulmonary/Chest: Effort normal and breath sounds normal. No respiratory distress. She has no wheezes.  Musculoskeletal: Normal range of motion.  Lymphadenopathy:    She has no cervical adenopathy.  Neurological: She is alert and oriented to person, place, and time.  Skin: Skin is warm and dry. No rash noted.  Psychiatric: She has a normal mood and affect. Her behavior is normal.  Nursing note and vitals reviewed.   ED Course   Procedures (including critical care time) Labs Review Labs Reviewed - No data to display  Imaging Review No results found.   MDM   1. Cough   Albuterol MDI Tessalon Prednisone taper PCP follow up if no resolution    Lutricia Feil, PA 12/24/13 1117

## 2014-09-11 ENCOUNTER — Emergency Department (INDEPENDENT_AMBULATORY_CARE_PROVIDER_SITE_OTHER): Payer: Self-pay

## 2014-09-11 ENCOUNTER — Encounter (HOSPITAL_COMMUNITY): Payer: Self-pay | Admitting: Emergency Medicine

## 2014-09-11 ENCOUNTER — Emergency Department (INDEPENDENT_AMBULATORY_CARE_PROVIDER_SITE_OTHER)
Admission: EM | Admit: 2014-09-11 | Discharge: 2014-09-11 | Disposition: A | Discharge status: Home / Self Care | Payer: Self-pay | Source: Home / Self Care | Attending: Emergency Medicine | Admitting: Emergency Medicine

## 2014-09-11 DIAGNOSIS — S63602A Unspecified sprain of left thumb, initial encounter: Secondary | ICD-10-CM

## 2014-09-11 MED ORDER — IBUPROFEN 600 MG PO TABS: 600.0000 mg | ORAL_TABLET | Freq: Four times a day (QID) | ORAL | Status: DC | PRN

## 2014-09-11 NOTE — Discharge Instructions (Signed)
You have sprained your thumb. Wear the brace for the next 3 days. Wear it at work for the next week. Remove the brace several times a day to ice the thumb and do gentle range of motion. Take ibuprofen every 6-8 hours for the next 3 days, then as needed. You should see improvement in the next 3 days, but this will take a week or 2 to fully heal. Follow-up as needed.

## 2014-09-11 NOTE — ED Provider Notes (Signed)
CSN: [id number]     Arrival date & time 09/11/14  1654 History   First MD Initiated Contact with Patient 09/11/14 1700     Chief Complaint  Patient presents with  . Finger Injury   (Consider location/radiation/quality/duration/timing/severity/associated sxs/prior Treatment) HPI  She is a 39 year old woman here for evaluation of left thumb injury. She states she was around 6 AM. She was holding a heavy pot as spaghetti sauce when it slipped out of her hand. She states the handle caught her thumb and hyperextended it. She had pain and swelling in the thumb. She put some ice on it and laid back down. When it was still hurting this afternoon, she decided to come in and be seen.  The pain is worse with extension and flexion.  She states she has not taken her blood pressure medicine yet today.  Past Medical History  Diagnosis Date  . Hypertension   . High cholesterol   . Sickle cell trait    Past Surgical History  Procedure Laterality Date  . Tubal ligation     History reviewed. No pertinent family history. History  Substance Use Topics  . Smoking status: Never Smoker   . Smokeless tobacco: Not on file  . Alcohol Use: No     Comment: occaisonal   OB History    No data available     Review of Systems As in history of present illness Allergies  Latex; Oxycodone; and Percocet  Home Medications   Prior to Admission medications   Medication Sig Start Date End Date Taking? Authorizing Provider  albuterol (PROVENTIL HFA;VENTOLIN HFA) 108 (90 BASE) MCG/ACT inhaler Inhale 1-2 puffs into the lungs every 6 (six) hours as needed for wheezing or shortness of breath (or persistent coughing). 12/24/13   Audelia Hives Presson, PA  aspirin 325 MG tablet Take 325 mg by mouth daily.    Historical Provider, MD  benzonatate (TESSALON) 100 MG capsule Take 1 capsule (100 mg total) by mouth 3 (three) times daily as needed for cough. 12/24/13   Audelia Hives Presson, PA  esomeprazole (NEXIUM) 20 MG  capsule Take 20 mg by mouth daily before breakfast.    Historical Provider, MD  hydrochlorothiazide (HYDRODIURIL) 25 MG tablet Take 25 mg by mouth daily.    Historical Provider, MD  ibuprofen (ADVIL,MOTRIN) 600 MG tablet Take 1 tablet (600 mg total) by mouth every 6 (six) hours as needed for moderate pain. 09/11/14   Melony Overly, MD  Multiple Vitamin (MULTIVITAMIN WITH MINERALS) TABS tablet Take 1 tablet by mouth daily.    Historical Provider, MD  predniSONE (DELTASONE) 10 MG tablet Take 5 tabs po QD day 1, 4 tabs po QD day 2, 3 tabs po QD day 3, 2 tabs po QD day 4, 1 tab po QD day 5 then stop 12/24/13   Annett Gula H Presson, PA   BP 190/103 mmHg  Pulse 82  Temp(Src) 98.6 F (37 C) (Oral)  Resp 20  SpO2 97%  LMP 09/11/2014 Physical Exam  Constitutional: She is oriented to person, place, and time. She appears well-developed and well-nourished. No distress.  Cardiovascular: Normal rate.   Pulmonary/Chest: Effort normal.  Musculoskeletal:  Left thumb: No erythema or bruising. She does have some swelling along the volar aspect of the thumb. She is most tender at the thenar eminence and volar MCP area. She has pain with passive extension of the thumb. No pain with abduction or abduction. No tenderness over the collateral ligaments. She can  fully flex and extend both the IP and MCP joints. Brisk cap refill.  Neurological: She is alert and oriented to person, place, and time.    ED Course  Procedures (including critical care time) Labs Review Labs Reviewed - No data to display  Imaging Review Dg Finger Thumb Left  09/11/2014   CLINICAL DATA:  Patient dropped pot causing stress type injury to thumb  EXAM: LEFT THUMB 2+V  COMPARISON:  None.  FINDINGS: Frontal, oblique, lateral views obtained. No fracture or dislocation apparent. Joint spaces appear intact. No erosive change.  IMPRESSION: No abnormality noted radiographically.   Electronically Signed   By: Lowella Grip III M.D.   On:  09/11/2014 17:28     MDM   1. Left thumb sprain, initial encounter    Thumb spica brace given. Recommended bracing, ice, ibuprofen. Okay to return to work without restriction.    Melony Overly, MD 09/11/14 [phone]

## 2014-09-11 NOTE — ED Notes (Signed)
C/o left thumb pain this morning  States she was holding a pot when she lost grip and pot fall States thumb is swollen and painful

## 2015-04-16 DIAGNOSIS — Z8679 Personal history of other diseases of the circulatory system: Secondary | ICD-10-CM | POA: Insufficient documentation

## 2015-04-16 HISTORY — DX: Personal history of other diseases of the circulatory system: Z86.79

## 2015-04-30 ENCOUNTER — Inpatient Hospital Stay (HOSPITAL_COMMUNITY)
Admission: EM | Admit: 2015-04-30 | Discharge: 2015-05-06 | DRG: 064 | Disposition: A | Discharge status: Home / Self Care | Payer: No Typology Code available for payment source | Attending: Neurology | Admitting: Neurology

## 2015-04-30 ENCOUNTER — Emergency Department (HOSPITAL_COMMUNITY): Payer: No Typology Code available for payment source

## 2015-04-30 ENCOUNTER — Encounter (HOSPITAL_COMMUNITY): Payer: Self-pay | Admitting: *Deleted

## 2015-04-30 DIAGNOSIS — E7800 Pure hypercholesterolemia, unspecified: Secondary | ICD-10-CM | POA: Diagnosis present

## 2015-04-30 DIAGNOSIS — E876 Hypokalemia: Secondary | ICD-10-CM | POA: Diagnosis present

## 2015-04-30 DIAGNOSIS — G919 Hydrocephalus, unspecified: Secondary | ICD-10-CM | POA: Diagnosis present

## 2015-04-30 DIAGNOSIS — Z91040 Latex allergy status: Secondary | ICD-10-CM | POA: Diagnosis not present

## 2015-04-30 DIAGNOSIS — I10 Essential (primary) hypertension: Secondary | ICD-10-CM | POA: Diagnosis present

## 2015-04-30 DIAGNOSIS — Z7982 Long term (current) use of aspirin: Secondary | ICD-10-CM | POA: Diagnosis not present

## 2015-04-30 DIAGNOSIS — E785 Hyperlipidemia, unspecified: Secondary | ICD-10-CM | POA: Diagnosis present

## 2015-04-30 DIAGNOSIS — I610 Nontraumatic intracerebral hemorrhage in hemisphere, subcortical: Secondary | ICD-10-CM

## 2015-04-30 DIAGNOSIS — E669 Obesity, unspecified: Secondary | ICD-10-CM

## 2015-04-30 DIAGNOSIS — G936 Cerebral edema: Secondary | ICD-10-CM | POA: Diagnosis present

## 2015-04-30 DIAGNOSIS — Z6841 Body Mass Index (BMI) 40.0 and over, adult: Secondary | ICD-10-CM | POA: Diagnosis not present

## 2015-04-30 DIAGNOSIS — S06360A Traumatic hemorrhage of cerebrum, unspecified, without loss of consciousness, initial encounter: Secondary | ICD-10-CM | POA: Diagnosis present

## 2015-04-30 DIAGNOSIS — I161 Hypertensive emergency: Secondary | ICD-10-CM

## 2015-04-30 DIAGNOSIS — S0633AA Contusion and laceration of cerebrum, unspecified, with loss of consciousness status unknown, initial encounter: Secondary | ICD-10-CM

## 2015-04-30 DIAGNOSIS — D573 Sickle-cell trait: Secondary | ICD-10-CM | POA: Diagnosis present

## 2015-04-30 DIAGNOSIS — I615 Nontraumatic intracerebral hemorrhage, intraventricular: Secondary | ICD-10-CM | POA: Diagnosis present

## 2015-04-30 DIAGNOSIS — D509 Iron deficiency anemia, unspecified: Secondary | ICD-10-CM | POA: Diagnosis present

## 2015-04-30 DIAGNOSIS — I160 Hypertensive urgency: Secondary | ICD-10-CM | POA: Insufficient documentation

## 2015-04-30 DIAGNOSIS — Z885 Allergy status to narcotic agent status: Secondary | ICD-10-CM

## 2015-04-30 DIAGNOSIS — N179 Acute kidney failure, unspecified: Secondary | ICD-10-CM | POA: Diagnosis present

## 2015-04-30 DIAGNOSIS — K219 Gastro-esophageal reflux disease without esophagitis: Secondary | ICD-10-CM | POA: Diagnosis present

## 2015-04-30 DIAGNOSIS — I619 Nontraumatic intracerebral hemorrhage, unspecified: Secondary | ICD-10-CM | POA: Diagnosis present

## 2015-04-30 DIAGNOSIS — Z79899 Other long term (current) drug therapy: Secondary | ICD-10-CM

## 2015-04-30 HISTORY — DX: Obesity, unspecified: E66.9

## 2015-04-30 LAB — CBG MONITORING, ED: GLUCOSE-CAPILLARY: 124 mg/dL — AB (ref 65–99)

## 2015-04-30 LAB — CBC
HCT: 37.1 % (ref 36.0–46.0)
Hemoglobin: 11.8 g/dL — ABNORMAL LOW (ref 12.0–15.0)
MCH: 22.5 pg — ABNORMAL LOW (ref 26.0–34.0)
MCHC: 31.8 g/dL (ref 30.0–36.0)
MCV: 70.7 fL — ABNORMAL LOW (ref 78.0–100.0)
Platelets: 325 10*3/uL (ref 150–400)
RBC: 5.25 MIL/uL — ABNORMAL HIGH (ref 3.87–5.11)
RDW: 18.1 % — AB (ref 11.5–15.5)
WBC: 10.8 10*3/uL — ABNORMAL HIGH (ref 4.0–10.5)

## 2015-04-30 LAB — COMPREHENSIVE METABOLIC PANEL
ALBUMIN: 3.5 g/dL (ref 3.5–5.0)
ALT: 19 U/L (ref 14–54)
AST: 22 U/L (ref 15–41)
Alkaline Phosphatase: 91 U/L (ref 38–126)
Anion gap: 12 (ref 5–15)
BUN: 11 mg/dL (ref 6–20)
CALCIUM: 9.2 mg/dL (ref 8.9–10.3)
CHLORIDE: 104 mmol/L (ref 101–111)
CO2: 27 mmol/L (ref 22–32)
Creatinine, Ser: 1.19 mg/dL — ABNORMAL HIGH (ref 0.44–1.00)
GFR calc Af Amer: >60 mL/min (ref >60)
GFR calc non Af Amer: 56 mL/min — ABNORMAL LOW (ref >60)
GLUCOSE: 159 mg/dL — AB (ref 65–99)
POTASSIUM: 3.2 mmol/L — AB (ref 3.5–5.1)
SODIUM: 143 mmol/L (ref 135–145)
TOTAL PROTEIN: 8.1 g/dL (ref 6.5–8.1)
Total Bilirubin: 0.6 mg/dL (ref 0.3–1.2)

## 2015-04-30 LAB — PROTIME-INR
INR: 1.11 (ref 0.00–1.49)
PROTHROMBIN TIME: 14.5 s (ref 11.6–15.2)

## 2015-04-30 LAB — MRSA PCR SCREENING: MRSA by PCR: NEGATIVE

## 2015-04-30 MED ORDER — NICARDIPINE HCL IN NACL 20-0.86 MG/200ML-% IV SOLN
3.0000 mg/h | INTRAVENOUS | Status: DC
  Administered 2015-04-30: 5 mg/h via INTRAVENOUS
  Administered 2015-04-30 (×2): 15 mg/h via INTRAVENOUS
  Administered 2015-04-30: 12.5 mg/h via INTRAVENOUS
  Filled 2015-04-30 (×2): qty 200

## 2015-04-30 MED ORDER — LABETALOL HCL 5 MG/ML IV SOLN
5.0000 mg | INTRAVENOUS | Status: DC | PRN
  Administered 2015-04-30: 5 mg via INTRAVENOUS
  Filled 2015-04-30: qty 4

## 2015-04-30 MED ORDER — FENTANYL CITRATE (PF) 100 MCG/2ML IJ SOLN
50.0000 ug | Freq: Once | INTRAMUSCULAR | Status: AC
  Administered 2015-04-30: 50 ug via INTRAVENOUS
  Filled 2015-04-30: qty 2

## 2015-04-30 MED ORDER — CLEVIDIPINE BUTYRATE 0.5 MG/ML IV EMUL
0.0000 mg/h | INTRAVENOUS | Status: DC
  Administered 2015-04-30: 2 mg/h via INTRAVENOUS
  Administered 2015-04-30: 18 mg/h via INTRAVENOUS
  Administered 2015-05-01 (×4): 21 mg/h via INTRAVENOUS
  Administered 2015-05-01: 18 mg/h via INTRAVENOUS
  Administered 2015-05-01 (×5): 21 mg/h via INTRAVENOUS
  Administered 2015-05-01: 20 mg/h via INTRAVENOUS
  Administered 2015-05-01: 21 mg/h via INTRAVENOUS
  Administered 2015-05-02: 14 mg/h via INTRAVENOUS
  Filled 2015-04-30 (×17): qty 50

## 2015-04-30 MED ORDER — HYDRALAZINE HCL 20 MG/ML IJ SOLN: 10.0000 mg | INTRAMUSCULAR | Status: DC | PRN

## 2015-04-30 MED ORDER — POTASSIUM CHLORIDE 10 MEQ/100ML IV SOLN
10.0000 meq | INTRAVENOUS | Status: AC
  Administered 2015-04-30 (×2): 10 meq via INTRAVENOUS
  Filled 2015-04-30 (×2): qty 100

## 2015-04-30 MED ORDER — PANTOPRAZOLE SODIUM 40 MG IV SOLR
40.0000 mg | Freq: Every day | INTRAVENOUS | Status: DC
  Administered 2015-04-30 – 2015-05-01 (×2): 40 mg via INTRAVENOUS
  Filled 2015-04-30 (×2): qty 40

## 2015-04-30 MED ORDER — LABETALOL HCL 5 MG/ML IV SOLN
10.0000 mg | INTRAVENOUS | Status: DC | PRN
  Administered 2015-04-30: 10 mg via INTRAVENOUS

## 2015-04-30 MED ORDER — STROKE: EARLY STAGES OF RECOVERY BOOK
Freq: Once | Status: DC
  Filled 2015-04-30 (×2): qty 1

## 2015-04-30 MED ORDER — SENNOSIDES-DOCUSATE SODIUM 8.6-50 MG PO TABS
1.0000 | ORAL_TABLET | Freq: Two times a day (BID) | ORAL | Status: DC
  Administered 2015-05-01 – 2015-05-06 (×10): 1 via ORAL
  Filled 2015-04-30 (×12): qty 1

## 2015-04-30 MED ORDER — LABETALOL HCL 5 MG/ML IV SOLN
20.0000 mg | INTRAVENOUS | Status: DC | PRN
  Administered 2015-04-30 (×4): 20 mg via INTRAVENOUS
  Filled 2015-04-30 (×5): qty 4

## 2015-04-30 MED ORDER — METOCLOPRAMIDE HCL 5 MG/ML IJ SOLN: 10.0000 mg | Freq: Once | INTRAMUSCULAR | Status: DC

## 2015-04-30 MED ORDER — CETYLPYRIDINIUM CHLORIDE 0.05 % MT LIQD
7.0000 mL | Freq: Two times a day (BID) | OROMUCOSAL | Status: DC
  Administered 2015-04-30 – 2015-05-06 (×11): 7 mL via OROMUCOSAL

## 2015-04-30 MED ORDER — DIPHENHYDRAMINE HCL 50 MG/ML IJ SOLN: 12.5000 mg | Freq: Once | INTRAMUSCULAR | Status: DC

## 2015-04-30 NOTE — H&P (Addendum)
Requesting Physician: Dr. Adela Glimpse, ER       Reason for consultation: Intracerebral hemorrhage  HPI:                                                                                                                                         Belinda Ramirez is an 40 y.o. female patient who presented to the emergency room, with severe headache, onset was around noon when she is at work. 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 She apparently lifted some heavy boxes around that time following which she noticed severe onset of headache which gradually worsened. She presented to the ER along with a coworker. She also reported having some left upper and lower extremity mild weakness starting around this time. CT of the head done in the ER showed right basal ganglia intracerebral hemorrhage with intraventricular extension of the hemorrhage into the lateral third and fourth ventricles. Patient was given fentanyl to help with the headache which appears to be easing her headache. She is slightly sedated with fentanyl during my evaluation. She denies any vision or speech problems no symptoms involving the right upper and lower extremity.  She has a known hypertension history and is on hydrochlorothiazide 25 mg daily at home, reported good medication compliance in general except that she forgot to take her medication this morning. She is also on aspirin 325 mg daily and denies any anticoagulant use. While she is in the ER, her blood pressure was severely elevated initially up to systolic 123456. She is maxed out on nicardipine drip at 15 mg and her blood pressure still continue to be in 180s when I initially saw her.  Date last known well:  04/30/2015 Time last known well:  1200 tPA Given: No: ICH  Stroke Risk Factors - hypertension  Past Medical History: Past Medical History  Diagnosis Date  . Hypertension   . High cholesterol   . Sickle cell trait ([place])   . Obesity     Past Surgical History  Procedure  Laterality Date  . Tubal ligation      Family History: History reviewed. No pertinent family history.  Social History:   reports that she has never smoked. She does not have any smokeless tobacco history on file. She reports that she does not drink alcohol or use illicit drugs.  Allergies:  Allergies  Allergen Reactions  . Latex Itching  . Oxycodone Hives  . Percocet [Oxycodone-Acetaminophen] Hives     Medications:  Current facility-administered medications:  .   stroke: mapping our early stages of recovery book, , Does not apply, Once, Emerald Shor Fuller Mandril, MD .  labetalol (NORMODYNE,TRANDATE) injection 10 mg, 10 mg, Intravenous, Q10 min PRN, [name], MD .  nicardipine (CARDENE) 20mg  in 0.86% saline 2105ml IV infusion (0.1 mg/ml), 3-15 mg/hr, Intravenous, Continuous, Belinda Algernon Huxley, MD, Last Rate: 150 mL/hr at 04/30/15 1703, 15 mg/hr at 04/30/15 1703 .  pantoprazole (PROTONIX) injection 40 mg, 40 mg, Intravenous, QHS, Belinda Ramirez Fuller Mandril, MD .  senna-docusate (Senokot-S) tablet 1 tablet, 1 tablet, Oral, BID, Belinda Ramirez Fuller Mandril, MD  Current outpatient prescriptions:  .  albuterol (PROVENTIL HFA;VENTOLIN HFA) 108 (90 BASE) MCG/ACT inhaler, Inhale 1-2 puffs into the lungs every 6 (six) hours as needed for wheezing or shortness of breath (or persistent coughing)., Disp: 1 Inhaler, Rfl: 0 .  aspirin 325 MG tablet, Take 325 mg by mouth daily., Disp: , Rfl:  .  esomeprazole (NEXIUM) 20 MG capsule, Take 20 mg by mouth daily before breakfast., Disp: , Rfl:  .  hydrochlorothiazide (HYDRODIURIL) 25 MG tablet, Take 25 mg by mouth daily., Disp: , Rfl:  .  ibuprofen (ADVIL,MOTRIN) 600 MG tablet, Take 1 tablet (600 mg total) by mouth every 6 (six) hours as needed for moderate pain., Disp: 30 tablet, Rfl: 0 .  Multiple Vitamin  (MULTIVITAMIN WITH MINERALS) TABS tablet, Take 1 tablet by mouth daily., Disp: , Rfl:    ROS:                                                                                                                                       History obtained from the patient  General ROS: negative for - chills, fatigue, fever, night sweats, weight gain or weight loss Psychological ROS: negative for - behavioral disorder, hallucinations, memory difficulties, mood swings or suicidal ideation Ophthalmic ROS: negative for - blurry vision, double vision, eye pain or loss of vision ENT ROS: negative for - epistaxis, nasal discharge, oral lesions, sore throat, tinnitus or vertigo Allergy and Immunology ROS: negative for - hives or itchy/watery eyes Hematological and Lymphatic ROS: negative for - bleeding problems, bruising or swollen lymph nodes Endocrine ROS: negative for - galactorrhea, hair pattern changes, polydipsia/polyuria or temperature intolerance Respiratory ROS: negative for - cough, hemoptysis, shortness of breath or wheezing Cardiovascular ROS: negative for - chest pain, dyspnea on exertion, edema or irregular heartbeat Gastrointestinal ROS: negative for - abdominal pain, diarrhea, hematemesis, nausea/vomiting or stool incontinence Genito-Urinary ROS: negative for - dysuria, hematuria, incontinence or urinary frequency/urgency Musculoskeletal ROS: negative for - joint swelling or muscular weakness Neurological ROS: as noted in HPI Dermatological ROS: negative for rash and skin lesion changes  Neurologic Examination:  Today's Vitals   04/30/15 1655 04/30/15 1700 04/30/15 1723 04/30/15 1737  BP: 195/102 201/84  168/70  Pulse: 91 95    Temp:      TempSrc:      Resp: 31 27    SpO2: 96% 95%    PainSc:   8      Evaluation of higher integrative functions including: Level of alertness: Slightly drowsy  from the fentanyl given a few minutes prior   Oriented to time, place and person Speech: fluent, no evidence of dysarthria or aphasia noted.  Test the following cranial nerves: 2-12 grossly intact Motor examination:  mild 4+/5 left upper and lower extremity weakness, full 5/5 motor strength in  right upper and lower extremities Examination of sensation : Normal and symmetric sensation to pinprick in all 4 extremities and on face Examination of deep tendon reflexes: 2+, normal and symmetric in all extremities, normal plantars bilaterally Test coordination: Normal finger nose testing, with no evidence of limb appendicular ataxia or abnormal involuntary movements or tremors noted.  Gait: Deferred     Lab Results: Basic Metabolic Panel:  Recent Labs Lab 04/30/15 1325  NA 143  K 3.2*  CL 104  CO2 27  GLUCOSE 159*  BUN 11  CREATININE 1.19*  CALCIUM 9.2    Liver Function Tests:  Recent Labs Lab 04/30/15 1325  AST 22  ALT 19  ALKPHOS 91  BILITOT 0.6  PROT 8.1  ALBUMIN 3.5   No results for input(s): LIPASE, AMYLASE in the last 168 hours. No results for input(s): AMMONIA in the last 168 hours.  CBC:  Recent Labs Lab 04/30/15 1325  WBC 10.8*  HGB 11.8*  HCT 37.1  MCV 70.7*  PLT 325    Cardiac Enzymes: No results for input(s): CKTOTAL, CKMB, CKMBINDEX, TROPONINI in the last 168 hours.  Lipid Panel: No results for input(s): CHOL, TRIG, HDL, CHOLHDL, VLDL, LDLCALC in the last 168 hours.  CBG:  Recent Labs Lab 04/30/15 1325  GLUCAP 124*    Microbiology: No results found for this or any previous visit.   Imaging: Ct Head Wo Contrast  04/30/2015  CLINICAL DATA:  40 year old female. Sudden onset of headache. Nausea and vomiting. EXAM: CT HEAD WITHOUT CONTRAST TECHNIQUE: Contiguous axial images were obtained from the base of the skull through the vertex without intravenous contrast. COMPARISON:  07/01/2005 FINDINGS: Unremarkable appearance of the calvarium  without acute fracture or aggressive lesion. Unremarkable appearance of the scalp soft tissues. Unremarkable appearance of the bilateral orbits. Mastoid air cells are clear. No significant paranasal sinus disease Acute intracranial hemorrhage, with focus of hemorrhage centered in the right anterior basal ganglia measuring 14 mm x 22 mm. There is intraventricular extension posterior to the caudate, with casting of the right lateral ventricle. Hemorrhage extends from the third ventricle to the anterior horn of the left lateral ventricle as well as through the aqua duct to involve the fourth ventricle. There is early hydrocephalus with expansion of the left posterior horn, and left anterior horn compared to the prior CT. 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 Evidence of early hydrocephalus. 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 These results were called by telephone at the time of interpretation on 04/30/2015 at 4:22 pm to Dr. Darlyne Russian , who verbally acknowledged these results. Signed, Dulcy Fanny. Earleen Newport, DO [facility] Radiology Electronically Signed   By: Corrie Mckusick D.O.   On: 04/30/2015 16:25      Assessment and plan:   Valicia Prinzo is an 40 y.o. female patient who presented with severe headache and mild left-sided weakness, onset around 12 PM. Initial CT of the head in the ER showed right caudate hemorrhage with intraventricular extension into the right lateral ventricle, third and fourth ventricles as well. Neurosurgery has been consulted by the ER physician, consult is pending at the time of my evaluation.  She'll be admitted to the ICU for close neurological monitoring and blood pressure management. Her blood  pressure continues to be elevated with systolics in [id number] with max dose of nicardipine drip at 15 mg. recommend switching the nicardipine to clevidipine infusion and titrate to a goal blood pressure of systolics between [id number]. May use the PRN labetalol. Couple of doses of labetalol 10 mg were given during my evaluation which improved the blood pressure.  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  Physical therapy, occupational therapy and swallow evaluation. SCDs for DVT prophylaxis. Stop aspirin. Frequent neuro checks.  Critical care team was consulted for close blood pressure monitoring.  Will follow-up neurosurgical consultation. Defer the decision to place EVD to neurosurgery.   Stroke team will continue to follow starting tomorrow morning .   ICH score : 1

## 2015-04-30 NOTE — Consult Note (Signed)
PULMONARY / CRITICAL CARE MEDICINE   Name: Belinda Ramirez MRN: [id number] DOB: December 11, 1975    ADMISSION DATE:  04/30/2015 CONSULTATION DATE:  04/30/15  REFERRING MD:  Silverio Decamp  CHIEF COMPLAINT:  Headache  HISTORY OF PRESENT ILLNESS:  Pt is encephelopathic; therefore, this HPI is obtained from chart review. Belinda Ramirez is a 40 y.o. female with PMH as outlined below including poorly controlled HTN (although is only on HCTZ).  She was at her job on 03/15 when she bent up to pick up a heavy box around 11:30AM and suddenly developed severe onset of frontal headache.  Headache was extremely painful when it first began and was associated with several episodes of vomiting. She had no photophobia, dizziness, numbness, tingling.  She had missed her dose of HCTZ earlier that morning, otherwise had been compliant with it daily.  Denies any similar symptoms in the past.  In ED, she had CT of the head which revealed acute intraparenchymal hemorrhage with intraventricular extension; likely hypertensive etiology.  Initial BP in ED was 211/98.  Neurology and neurosurgery were both consulted and per neuro recs, BP goal was [id number] systolic.  She was started on nicardipine gtt and despite maximal dosing, SBP remained around 150.  She was subsequently switched to clevidipine and PCCM was consulted for assistance in BP control.  PAST MEDICAL HISTORY :  She  has a past medical history of Hypertension; High cholesterol; Sickle cell trait ([place]); and Obesity.  PAST SURGICAL HISTORY: She  has past surgical history that includes Tubal ligation.  Allergies  Allergen Reactions  . Latex Itching  . Oxycodone Hives  . Percocet [Oxycodone-Acetaminophen] Hives    No current facility-administered medications on file prior to encounter.   Current Outpatient Prescriptions on File Prior to Encounter  Medication Sig  . albuterol (PROVENTIL HFA;VENTOLIN HFA) 108 (90 BASE) MCG/ACT inhaler Inhale 1-2 puffs into the  lungs every 6 (six) hours as needed for wheezing or shortness of breath (or persistent coughing).  Marland Kitchen aspirin 325 MG tablet Take 325 mg by mouth daily.  Marland Kitchen esomeprazole (NEXIUM) 20 MG capsule Take 20 mg by mouth daily before breakfast.  . hydrochlorothiazide (HYDRODIURIL) 25 MG tablet Take 25 mg by mouth daily.  Marland Kitchen ibuprofen (ADVIL,MOTRIN) 600 MG tablet Take 1 tablet (600 mg total) by mouth every 6 (six) hours as needed for moderate pain.  . Multiple Vitamin (MULTIVITAMIN WITH MINERALS) TABS tablet Take 1 tablet by mouth daily.    FAMILY HISTORY:  Her has no family status information on file.   SOCIAL HISTORY: She  reports that she has never smoked. She does not have any smokeless tobacco history on file. She reports that she does not drink alcohol or use illicit drugs.  REVIEW OF SYSTEMS:   All negative; except for those that are bolded, which indicate positives.  Constitutional: weight loss, weight gain, night sweats, fevers, chills, fatigue, weakness.  HEENT: headache, sore throat, sneezing, nasal congestion, post nasal drip, difficulty swallowing, tooth/dental problems, visual complaints, visual changes, ear aches. Neuro: difficulty with speech, weakness, numbness, ataxia. CV:  chest pain, orthopnea, PND, swelling in lower extremities, dizziness, palpitations, syncope.  Resp: cough, hemoptysis, dyspnea, wheezing. GI  heartburn, indigestion, abdominal pain, nausea, vomiting, diarrhea, constipation, change in bowel habits, loss of appetite, hematemesis, melena, hematochezia.  GU: dysuria, change in color of urine, urgency or frequency, flank pain, hematuria. MSK: joint pain or swelling, decreased range of motion. Psych: change in mood or affect, depression, anxiety, suicidal ideations, homicidal ideations. Skin: rash,  itching, bruising.   SUBJECTIVE:  Nausea and vomiting resolved.  Headache much improved for now.    VITAL SIGNS: BP 161/78 mmHg  Pulse 84  Temp(Src) 97.8 F (36.6 C)  (Oral)  Resp 22  SpO2 93%  HEMODYNAMICS:    VENTILATOR SETTINGS:    INTAKE / OUTPUT:     PHYSICAL EXAMINATION: General: Young adult AA female, in NAD. Neuro: A&O x 3, non-focal. HEENT: [place]/AT. PERRL, sclerae anicteric. Cardiovascular: RRR, no M/R/G.  Lungs: Respirations even and unlabored.  CTA bilaterally, No W/R/R. Abdomen: Obese, BS x 4, soft, NT/ND.  Musculoskeletal: No gross deformities, no edema.  Skin: Intact, warm, no rashes.  LABS:  BMET  Recent Labs Lab 04/30/15 1325  NA 143  K 3.2*  CL 104  CO2 27  BUN 11  CREATININE 1.19*  GLUCOSE 159*    Electrolytes  Recent Labs Lab 04/30/15 1325  CALCIUM 9.2    CBC  Recent Labs Lab 04/30/15 1325  WBC 10.8*  HGB 11.8*  HCT 37.1  PLT 325    Coag's No results for input(s): APTT, INR in the last 168 hours.  Sepsis Markers No results for input(s): LATICACIDVEN, PROCALCITON, O2SATVEN in the last 168 hours.  ABG No results for input(s): PHART, PCO2ART, PO2ART in the last 168 hours.  Liver Enzymes  Recent Labs Lab 04/30/15 1325  AST 22  ALT 19  ALKPHOS 91  BILITOT 0.6  ALBUMIN 3.5    Cardiac Enzymes No results for input(s): TROPONINI, PROBNP in the last 168 hours.  Glucose  Recent Labs Lab 04/30/15 1325  GLUCAP 124*    Imaging Ct Head Wo Contrast  04/30/2015  CLINICAL DATA:  40 year old female. Sudden onset of headache. Nausea and vomiting. EXAM: CT HEAD WITHOUT CONTRAST TECHNIQUE: Contiguous axial images were obtained from the base of the skull through the vertex without intravenous contrast. COMPARISON:  07/01/2005 FINDINGS: Unremarkable appearance of the calvarium without acute fracture or aggressive lesion. Unremarkable appearance of the scalp soft tissues. Unremarkable appearance of the bilateral orbits. Mastoid air cells are clear. No significant paranasal sinus disease Acute intracranial hemorrhage, with focus of hemorrhage centered in the right anterior basal ganglia measuring  14 mm x 22 mm. There is intraventricular extension posterior to the caudate, with casting of the right lateral ventricle. Hemorrhage extends from the third ventricle to the anterior horn of the left lateral ventricle as well as through the aqua duct to involve the fourth ventricle. There is early hydrocephalus with expansion of the left posterior horn, and left anterior horn compared to the prior CT. 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 Evidence of early hydrocephalus. 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 These results were called by telephone at the time of interpretation on 04/30/2015 at 4:22 pm to Dr. Darlyne Russian , who verbally acknowledged these results. Signed, Dulcy Fanny. Earleen Newport, DO [facility] Radiology Electronically Signed   By: Corrie Mckusick D.O.   On: 04/30/2015 16:25     STUDIES:  CT head 03/15 > acute right basal ganglia IPH with IV extension, involving right lateral ventricle, 3rd ventricle, frontal horne of the left lateral ventricle, and aqua duct of the 4th ventricle.  Evidence of early hydrocephalus.  CULTURES: None.  ANTIBIOTICS: None.  SIGNIFICANT EVENTS: 03/15 > admitted with acute IPH due to uncontrolled hypertension.  LINES/TUBES: None.  DISCUSSION: 40 y.o. F with poorly controlled HTN (on single agent therapy with HCTZ), admitted 3/15 with severe headache due to spontaneous IPH.  PCCM called due to difficulty to control BP despite max dose nicardipine.  ASSESSMENT / PLAN:  NEUROLOGIC A:   Acute right basal ganglia IPH with IV extension, involving right lateral ventricle, 3rd ventricle, frontal horn of the left lateral ventricle, and aqua duct of the 4th ventricle.  Also with evidence  of early hydrocephalus.  This is most likely due to uncontrolled hypertension. P:   Tight BP control per cardiovascular section. Neurology following. Neurosurgery consulted by neuro. Stroke workup / management per neuro.  CARDIOVASCULAR A:  Hypertensive emergency - with subsequent spontaneous intraparenchymal hemorrhage 03/15. Hx poorly controlled HTN, HLD. P:  Continue clevidipine gtt - goal SBP 110 - 140 per neuro recs. Labetalol PRN. Hold outpatient HCTZ, ASA.  PULMONARY A: At risk intubation if intraparenchymal hemorrhage worsens / extends. P:   Monitor closely. Pulmonary hygiene.  RENAL A:   Hypokalemia. AKI. P:   Potassium x 2 runs. BMP in AM.  GASTROINTESTINAL A:   Obesity. GERD. Nutrition. P:   Pantoprazole. NPO.  HEMATOLOGIC A:   Mild microcytic anemia. VTE Prophylaxis. Sickle cell trait. P:  Transfuse for Hgb < 7. SCD's only. CBC in AM.  INFECTIOUS A:   No indication of infection. P:   Monitor clinically.  ENDOCRINE A:   No acute issues.   P:   Monitor glucose on BMP.   Family updated: None.  Interdisciplinary Family Meeting v Palliative Care Meeting:  Due by: 03/21.  CC time: 30 minutes.   Montey Hora, [facility] Pager: [phone]  or [phone] 04/30/2015, 6:50 PM

## 2015-04-30 NOTE — ED Provider Notes (Signed)
CSN: [id number]     Arrival date & time 04/30/15  15 History   First MD Initiated Contact with Patient 04/30/15 1548     Chief Complaint  Patient presents with  . Headache      HPI  40 y.o. female with history of poorly controlled hypertension on hydrochlorothiazide, hyperlipidemia, and obesity, who presents with severe sudden onset frontal headache. Patient reports that she bent over to pick up a heavy box off the ground at 11:30 AM,when she developed severe onset headache. Headache was of maximum intensity when it first began. Denies vision changes or neurologic deficits. Patient reports severe photophobia as well as several EPISODES of nonbloody nonbilious vomiting since. She has not taken her blood pressure medication today but does report compliance with it. Denies fevers, neck pain, chest pain, shortness of breath, cough, abdominal pain, dysuria. No history of migraine headaches.   Past Medical History  Diagnosis Date  . Hypertension   . High cholesterol   . Sickle cell trait ([place])   . Obesity    Past Surgical History  Procedure Laterality Date  . Tubal ligation     History reviewed. No pertinent family history. Social History  Substance Use Topics  . Smoking status: Never Smoker   . Smokeless tobacco: None  . Alcohol Use: No     Comment: occaisonal   OB History    No data available     Review of Systems  Constitutional: Negative for fever, chills, activity change and appetite change.  HENT: Negative for congestion, rhinorrhea and sore throat.   Eyes: Positive for photophobia. Negative for visual disturbance.  Respiratory: Negative for cough, shortness of breath and wheezing.   Cardiovascular: Negative for chest pain and palpitations.  Gastrointestinal: Positive for nausea and vomiting. Negative for abdominal pain, diarrhea, constipation, blood in stool and abdominal distention.  Genitourinary: Negative for dysuria, frequency and flank pain.  Musculoskeletal:  Negative for myalgias, back pain, joint swelling, arthralgias, gait problem, neck pain and neck stiffness.  Skin: Negative for rash.  Neurological: Positive for headaches. Negative for dizziness, tremors, seizures, syncope, facial asymmetry, speech difficulty, weakness, light-headedness and numbness.  Psychiatric/Behavioral: Negative for behavioral problems, confusion and agitation.      Allergies  Latex; Oxycodone; and Percocet  Home Medications   Prior to Admission medications   Medication Sig Start Date End Date Taking? Authorizing Provider  albuterol (PROVENTIL HFA;VENTOLIN HFA) 108 (90 BASE) MCG/ACT inhaler Inhale 1-2 puffs into the lungs every 6 (six) hours as needed for wheezing or shortness of breath (or persistent coughing). 12/24/13  Yes Audelia Hives Presson, PA  aspirin 325 MG tablet Take 325 mg by mouth daily.   Yes Historical Provider, MD  esomeprazole (NEXIUM) 20 MG capsule Take 20 mg by mouth daily before breakfast.   Yes Historical Provider, MD  hydrochlorothiazide (HYDRODIURIL) 25 MG tablet Take 25 mg by mouth daily.   Yes Historical Provider, MD  ibuprofen (ADVIL,MOTRIN) 600 MG tablet Take 1 tablet (600 mg total) by mouth every 6 (six) hours as needed for moderate pain. 09/11/14  Yes Melony Overly, MD  Multiple Vitamin (MULTIVITAMIN WITH MINERALS) TABS tablet Take 1 tablet by mouth daily.   Yes Historical Provider, MD   BP 201/84 mmHg  Pulse 95  Temp(Src) 97.8 F (36.6 C) (Oral)  Resp 27  SpO2 95% Physical Exam  Constitutional: She is oriented to person, place, and time. She appears well-developed and well-nourished. No distress.  HENT:  Head: Normocephalic and atraumatic.  Right  Ear: External ear normal.  Left Ear: External ear normal.  Nose: Nose normal.  Mouth/Throat: Oropharynx is clear and moist. No oropharyngeal exudate.  Eyes: Conjunctivae and EOM are normal. Pupils are equal, round, and reactive to light. Right eye exhibits no discharge. Left eye exhibits  no discharge.  Neck: Normal range of motion. Neck supple.  Cardiovascular: Normal rate, regular rhythm and normal heart sounds.  Exam reveals no gallop and no friction rub.   No murmur heard. Pulmonary/Chest: Breath sounds normal. No respiratory distress. She has no wheezes. She has no rales.  Abdominal: Soft. Bowel sounds are normal. She exhibits no distension and no mass. There is no tenderness. There is no rebound and no guarding.  Musculoskeletal: Normal range of motion. She exhibits no edema or tenderness.  Neurological: She is alert and oriented to person, place, and time. She exhibits normal muscle tone.  Face symmetric, tongue midline. 5/5 strength in the proximal and distal upper and lower extremities bilaterally, with intact sensation to light touch. Normal finger to nose, heel to shin, and rapid alternating movements. Normal speech.    Skin: Skin is warm and dry. No rash noted. She is not diaphoretic.  Psychiatric: She has a normal mood and affect. Her behavior is normal. Judgment and thought content normal.    ED Course  Procedures (including critical care time) Labs Review Labs Reviewed  COMPREHENSIVE METABOLIC PANEL - Abnormal; Notable for the following:    Potassium 3.2 (*)    Glucose, Bld 159 (*)    Creatinine, Ser 1.19 (*)    GFR calc non Af Amer 56 (*)    All other components within normal limits  CBC - Abnormal; Notable for the following:    WBC 10.8 (*)    RBC 5.25 (*)    Hemoglobin 11.8 (*)    MCV 70.7 (*)    MCH 22.5 (*)    RDW 18.1 (*)    All other components within normal limits  CBG MONITORING, ED - Abnormal; Notable for the following:    Glucose-Capillary 124 (*)    All other components within normal limits    Imaging Review Ct Head Wo Contrast  04/30/2015  CLINICAL DATA:  40 year old female. Sudden onset of headache. Nausea and vomiting. EXAM: CT HEAD WITHOUT CONTRAST TECHNIQUE: Contiguous axial images were obtained from the base of the skull through  the vertex without intravenous contrast. COMPARISON:  07/01/2005 FINDINGS: Unremarkable appearance of the calvarium without acute fracture or aggressive lesion. Unremarkable appearance of the scalp soft tissues. Unremarkable appearance of the bilateral orbits. Mastoid air cells are clear. No significant paranasal sinus disease Acute intracranial hemorrhage, with focus of hemorrhage centered in the right anterior basal ganglia measuring 14 mm x 22 mm. There is intraventricular extension posterior to the caudate, with casting of the right lateral ventricle. Hemorrhage extends from the third ventricle to the anterior horn of the left lateral ventricle as well as through the aqua duct to involve the fourth ventricle. There is early hydrocephalus with expansion of the left posterior horn, and left anterior horn compared to the prior CT. 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 Evidence of early hydrocephalus. 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 These results were called by telephone at the time of interpretation on 04/30/2015 at 4:22 pm to Dr. Darlyne Russian , who verbally acknowledged  these results. Signed, Dulcy Fanny. Earleen Newport, DO [facility] Radiology Electronically Signed   By: Corrie Mckusick D.O.   On: 04/30/2015 16:25   I have personally reviewed and evaluated these images and lab results as part of my medical decision-making.   EKG Interpretation None      MDM   Final diagnoses:  Intraparenchymal hematoma of brain, unspecified laterality, without loss of consciousness, initial encounter [facility])    On arrival the patient is hypertensive to 211/98. Reports severe frontal headache. Neurologically intact as above. CT head ordered  immediately upon patient arrival that reveals an acute right basal ganglia intraparenchymal hemorrhage with intraventricular extension as well as evidence of early hydrocephalus. Patient continues to [facility] normally. She started on a Cardene drip with goal SBP less than 140. Neurosurgery consulted and will evaluate the patient in the emergency department. They're recommending admission to the neurology service for management. Patient additionally is noted to have an AKI with creatinine of 1.19, likely in the setting of severe hypertension. She denies chest pain or vision changes. EKG with no ischemic changes. Neurology consulted and evaluated the patient in the emergency department. Recommend tight control of her systolic blood pressure between 110 and 140 and admission to the neuro ICU for management.    Zipporah Plants, MD 04/30/15 [place], MD 05/01/15 1312

## 2015-04-30 NOTE — Procedures (Signed)
Arterial Catheter Insertion Procedure Note Shaunika Richberg [id number] 1975/11/18  Procedure: Insertion of Arterial Catheter  Indications: Blood pressure monitoring  Procedure Details Consent: Unable to obtain consent because of altered level of consciousness. Time Out: Verified patient identification, verified procedure, site/side was marked, verified correct patient position, special equipment/implants available, medications/allergies/relevent history reviewed, required imaging and test results available.  Performed  Maximum sterile technique was used including antiseptics, cap, gloves, gown, hand hygiene, mask and sheet. Skin prep: Chlorhexidine; local anesthetic administered 20 gauge catheter was inserted into right radial artery using the Seldinger technique.  Evaluation Blood flow good; BP tracing good. Complications: No apparent complications.   Marlowe Aschoff 04/30/2015

## 2015-04-30 NOTE — ED Notes (Signed)
Pt reports being at work, bent over to do something and then onset of severe headache and n/v. No neuro deficits noted at triage. Pt appears very lethargic but is able to answer all questions appropriately.

## 2015-04-30 NOTE — ED Notes (Signed)
Checked patient blood sugar it was 124 notified RN Koula of blood sugar

## 2015-04-30 NOTE — Progress Notes (Signed)
[facility] Progress Note Patient Name: Rebeka Levon DOB: [birth year] MRN: [id number]   Date of Service  04/30/2015  HPI/Events of Note  O2 sat = 90% to 92%. RR = 32-39.  eICU Interventions  Will order O2 by [place]. Keep sat > 92%.      Intervention Category Major Interventions: Hypoxemia - evaluation and management  Sommer,Steven Eugene 04/30/2015, 10:22 PM

## 2015-04-30 NOTE — Consult Note (Signed)
I was called by team to please re-call Neurosurgery as patient apparetly has not been seen yet by their team.  I spoke with neurosurgery and they are aware of pateint and ready to place drain if her exam changes.  Please feel free to reach oput to me with any further questions

## 2015-05-01 ENCOUNTER — Ambulatory Visit (HOSPITAL_COMMUNITY): Payer: No Typology Code available for payment source

## 2015-05-01 ENCOUNTER — Inpatient Hospital Stay (HOSPITAL_COMMUNITY): Payer: No Typology Code available for payment source

## 2015-05-01 DIAGNOSIS — I615 Nontraumatic intracerebral hemorrhage, intraventricular: Principal | ICD-10-CM

## 2015-05-01 DIAGNOSIS — I6789 Other cerebrovascular disease: Secondary | ICD-10-CM

## 2015-05-01 DIAGNOSIS — I10 Essential (primary) hypertension: Secondary | ICD-10-CM

## 2015-05-01 LAB — CBC
HCT: 35.1 % — ABNORMAL LOW (ref 36.0–46.0)
Hemoglobin: 11 g/dL — ABNORMAL LOW (ref 12.0–15.0)
MCH: 22 pg — AB (ref 26.0–34.0)
MCHC: 31.3 g/dL (ref 30.0–36.0)
MCV: 70.1 fL — AB (ref 78.0–100.0)
PLATELETS: 346 10*3/uL (ref 150–400)
RBC: 5.01 MIL/uL (ref 3.87–5.11)
RDW: 18.4 % — AB (ref 11.5–15.5)
WBC: 11 10*3/uL — AB (ref 4.0–10.5)

## 2015-05-01 LAB — BASIC METABOLIC PANEL
ANION GAP: 12 (ref 5–15)
Anion gap: 10 (ref 5–15)
BUN: 14 mg/dL (ref 6–20)
BUN: 16 mg/dL (ref 6–20)
CHLORIDE: 105 mmol/L (ref 101–111)
CHLORIDE: 103 mmol/L (ref 101–111)
CO2: 26 mmol/L (ref 22–32)
CO2: 23 mmol/L (ref 22–32)
CREATININE: 1.85 mg/dL — AB (ref 0.44–1.00)
Calcium: 8.6 mg/dL — ABNORMAL LOW (ref 8.9–10.3)
Calcium: 8.6 mg/dL — ABNORMAL LOW (ref 8.9–10.3)
Creatinine, Ser: 1.91 mg/dL — ABNORMAL HIGH (ref 0.44–1.00)
GFR calc Af Amer: 38 mL/min — ABNORMAL LOW (ref >60)
GFR calc Af Amer: 37 mL/min — ABNORMAL LOW (ref >60)
GFR calc non Af Amer: 33 mL/min — ABNORMAL LOW (ref >60)
GFR calc non Af Amer: 32 mL/min — ABNORMAL LOW (ref >60)
GLUCOSE: 121 mg/dL — AB (ref 65–99)
GLUCOSE: 140 mg/dL — AB (ref 65–99)
POTASSIUM: 3.4 mmol/L — AB (ref 3.5–5.1)
Potassium: 3.2 mmol/L — ABNORMAL LOW (ref 3.5–5.1)
SODIUM: 141 mmol/L (ref 135–145)
Sodium: 138 mmol/L (ref 135–145)

## 2015-05-01 LAB — RAPID URINE DRUG SCREEN, HOSP PERFORMED
AMPHETAMINES: NOT DETECTED
BENZODIAZEPINES: NOT DETECTED
Barbiturates: NOT DETECTED
COCAINE: NOT DETECTED
Opiates: NOT DETECTED
Tetrahydrocannabinol: NOT DETECTED

## 2015-05-01 LAB — GLUCOSE, CAPILLARY
Glucose-Capillary: 104 mg/dL — ABNORMAL HIGH (ref 65–99)
Glucose-Capillary: 115 mg/dL — ABNORMAL HIGH (ref 65–99)
Glucose-Capillary: 117 mg/dL — ABNORMAL HIGH (ref 65–99)
Glucose-Capillary: 118 mg/dL — ABNORMAL HIGH (ref 65–99)
Glucose-Capillary: 132 mg/dL — ABNORMAL HIGH (ref 65–99)

## 2015-05-01 LAB — LIPID PANEL
CHOL/HDL RATIO: 3 ratio
Cholesterol: 187 mg/dL (ref 0–200)
HDL: 63 mg/dL (ref >40)
LDL Cholesterol: 108 mg/dL — ABNORMAL HIGH (ref 0–99)
TRIGLYCERIDES: 80 mg/dL (ref <150)
VLDL: 16 mg/dL (ref 0–40)

## 2015-05-01 LAB — ECHOCARDIOGRAM COMPLETE
HEIGHTINCHES: 64 in
Weight: 4261.05 oz

## 2015-05-01 LAB — TSH: TSH: 1.671 u[IU]/mL (ref 0.350–4.500)

## 2015-05-01 LAB — HIV ANTIBODY (ROUTINE TESTING W REFLEX): HIV Screen 4th Generation wRfx: NONREACTIVE

## 2015-05-01 LAB — RPR: RPR Ser Ql: NONREACTIVE

## 2015-05-01 LAB — FOLATE: FOLATE: 19.3 ng/mL (ref >5.9)

## 2015-05-01 LAB — VITAMIN B12: Vitamin B-12: 296 pg/mL (ref 180–914)

## 2015-05-01 MED ORDER — POTASSIUM CHLORIDE 10 MEQ/100ML IV SOLN
10.0000 meq | INTRAVENOUS | Status: AC
  Administered 2015-05-01 (×4): 10 meq via INTRAVENOUS
  Filled 2015-05-01 (×4): qty 100

## 2015-05-01 MED ORDER — LISINOPRIL 20 MG PO TABS
20.0000 mg | ORAL_TABLET | Freq: Two times a day (BID) | ORAL | Status: DC
  Administered 2015-05-01 – 2015-05-06 (×11): 20 mg via ORAL
  Filled 2015-05-01 (×11): qty 1

## 2015-05-01 MED ORDER — ONDANSETRON HCL 4 MG/2ML IJ SOLN
4.0000 mg | Freq: Three times a day (TID) | INTRAMUSCULAR | Status: AC | PRN
  Administered 2015-05-01: 4 mg via INTRAVENOUS

## 2015-05-01 MED ORDER — IOHEXOL 350 MG/ML SOLN
50.0000 mL | Freq: Once | INTRAVENOUS | Status: AC | PRN
  Administered 2015-05-01: 50 mL via INTRAVENOUS

## 2015-05-01 MED ORDER — AMLODIPINE BESYLATE 10 MG PO TABS
10.0000 mg | ORAL_TABLET | Freq: Every day | ORAL | Status: DC
  Administered 2015-05-01 – 2015-05-06 (×6): 10 mg via ORAL
  Filled 2015-05-01 (×6): qty 1

## 2015-05-01 MED ORDER — SODIUM CHLORIDE 0.9 % IV SOLN
INTRAVENOUS | Status: DC
  Administered 2015-05-01: 15:00:00 via INTRAVENOUS
  Administered 2015-05-02: 50 mL/h via INTRAVENOUS

## 2015-05-01 NOTE — Evaluation (Signed)
Clinical/Bedside Swallow Evaluation Patient Details  Name: Cecy Gridley MRN: [id number] Date of Birth: 12-05-1975  Today's Date: 05/01/2015 Time: SLP Start Time (ACUTE ONLY): 1100 SLP Stop Time (ACUTE ONLY): 1114 SLP Time Calculation (min) (ACUTE ONLY): 14 min  Past Medical History:  Past Medical History  Diagnosis Date  . Hypertension   . High cholesterol   . Sickle cell trait ([place])   . Obesity    Past Surgical History:  Past Surgical History  Procedure Laterality Date  . Tubal ligation     HPI:  40 year old female admitted with ICH (CT head 03/15 > acute right basal ganglia IPH with IV extension, involving right lateral ventricle, 3rd ventricle, frontal horne of the left lateral ventricle, and aqua duct of the 4th ventricle) with intraventricular extension and concern for hydrocephalus.    Assessment / Plan / Recommendation Clinical Impression  Swallow evaluation complete. Patient presents with a normal oropharyngeal swallow with good airway protection. May initiate a regular diet, thin liquids with general safe swallowing precautions. No SLP f/u indicated for swallow. Will f/u for cognitive-linguistic evaluation.     Aspiration Risk  No limitations    Diet Recommendation Regular;Thin liquid   Liquid Administration via: Cup;Straw Medication Administration: Whole meds with liquid Supervision: Patient able to self feed Compensations: Small sips/bites Postural Changes: Seated upright at 90 degrees    Other  Recommendations Oral Care Recommendations: Oral care BID   Follow up Recommendations  None               Swallow Study   General HPI: 40 year old female admitted with ICH (CT head 03/15 > acute right basal ganglia IPH with IV extension, involving right lateral ventricle, 3rd ventricle, frontal horne of the left lateral ventricle, and aqua duct of the 4th ventricle) with intraventricular extension and concern for hydrocephalus.  Type of Study: Bedside Swallow  Evaluation Previous Swallow Assessment: none Diet Prior to this Study: NPO Temperature Spikes Noted: No Respiratory Status: Room air History of Recent Intubation: No Behavior/Cognition: Lethargic/Drowsy;Cooperative;Pleasant mood Oral Cavity Assessment: Within Functional Limits Oral Care Completed by SLP: No Oral Cavity - Dentition: Adequate natural dentition Vision: Functional for self-feeding Self-Feeding Abilities: Able to feed self Patient Positioning: Upright in bed Baseline Vocal Quality: Normal Volitional Cough: Strong Volitional Swallow: Able to elicit    Oral/Motor/Sensory Function Overall Oral Motor/Sensory Function: Within functional limits   Ice Chips Ice chips: Not tested   Thin Liquid Thin Liquid: Within functional limits Presentation: Cup;Self Fed;Straw    Nectar Thick Nectar Thick Liquid: Not tested   Honey Thick Honey Thick Liquid: Not tested   Puree Puree: Within functional limits Presentation: Spoon   Solid   GO  Miki Blank MA, CCC-SLP [phone]  Solid: Within functional limits Presentation: Self Fed        Curry Dulski Meryl 05/01/2015,11:18 AM

## 2015-05-01 NOTE — Progress Notes (Signed)
OT Cancellation Note  Patient Details Name: Belinda Ramirez MRN: [id number] DOB: 11-Jul-1975   Cancelled Treatment:    Reason Eval/Treat Not Completed: Patient not medically ready. Will reattempt.   Darlina Rumpf [place], OTR/L [id number]  05/01/2015, 2:40 PM

## 2015-05-01 NOTE — Progress Notes (Addendum)
*  PRELIMINARY RESULTS* Vascular Ultrasound Renal Artery Duplex has been completed.  Preliminary findings: Technically difficult study due to body habitus and poor patient postition. No obvious renal artery stenosis identified bilaterally.   Landry Mellow, RDMS, RVT  05/01/2015, 10:58 AM

## 2015-05-01 NOTE — Care Management Note (Signed)
Case Management Note  Patient Details  Name: Areonna Bran MRN: [id number] Date of Birth: 1975/04/29  Subjective/Objective:     Pt admitted on 04/30/15 with large basal ganglia hemorrhage.  PTA, pt independent, lives alone.                 Action/Plan: Met with pt to discuss dc plans.  Pt states she has no family in the area; mother lives in [place], and can come to [place] to assist her if needed.  PT/OT evals pending.  Will follow progress.    Expected Discharge Date:                  Expected Discharge Plan:   Home with [facility] services  In-House Referral:     Discharge planning Services   CM Referral  Post Acute Care Choice:    Choice offered to:     DME Arranged:    DME Agency:     HH Arranged:    HH Agency:     Status of Service:   In process, will continue to follow  Medicare Important Message Given:    Date Medicare IM Given:    Medicare IM give by:    Date Additional Medicare IM Given:    Additional Medicare Important Message give by:     If discussed at [place] of Stay Meetings, dates discussed:    Additional Comments:  Reinaldo Raddle, RN, BSN  Trauma/Neuro ICU Case Manager [phone]

## 2015-05-01 NOTE — Progress Notes (Signed)
PULMONARY / CRITICAL CARE MEDICINE   Name: Belinda Ramirez MRN: [id number] DOB: March 11, 1975    ADMISSION DATE:  04/30/2015 CONSULTATION DATE:  04/30/15  REFERRING MD:  Silverio Decamp  CHIEF COMPLAINT:  Headache  HISTORY OF PRESENT ILLNESS:  Pt is encephelopathic; therefore, this HPI is obtained from chart review. Belinda Ramirez is a 40 y.o. female with PMH as outlined below including poorly controlled HTN (although is only on HCTZ).  She was at her job on 03/15 when she bent up to pick up a heavy box around 11:30AM and suddenly developed severe onset of frontal headache.  Headache was extremely painful when it first began and was associated with several episodes of vomiting. She had no photophobia, dizziness, numbness, tingling.  She had missed her dose of HCTZ earlier that morning, otherwise had been compliant with it daily.  Denies any similar symptoms in the past.  In ED, she had CT of the head which revealed acute intraparenchymal hemorrhage with intraventricular extension; likely hypertensive etiology.  Initial BP in ED was 211/98.  Neurology and neurosurgery were both consulted and per neuro recs, BP goal was [id number] systolic.  She was started on nicardipine gtt and despite maximal dosing, SBP remained around 150.  She was subsequently switched to clevidipine and PCCM was consulted for assistance in BP control.  SUBJECTIVE:   Bit sleepy but wakes and interacts. She passed her swallowing evaluation per RN  VITAL SIGNS: BP 138/62 mmHg  Pulse 85  Temp(Src) 98.6 F (37 C) (Oral)  Resp 26  Ht 5\' 4"  (1.626 m)  Wt 120.8 kg (266 lb 5.1 oz)  BMI 45.69 kg/m2  SpO2 99%  HEMODYNAMICS:    VENTILATOR SETTINGS:    INTAKE / OUTPUT: I/O last 3 completed shifts: In: 729.5 [I.V.:429.5; IV Piggyback:300] Out: 19 [Urine:1100]   PHYSICAL EXAMINATION: General: Young adult AA female, in NAD. Neuro: sleepy but wakes, O x 3, non-focal. HEENT: [place]/AT. PERRL, sclerae  anicteric. Cardiovascular: RRR, no M/R/G.  Lungs: Respirations even and unlabored.  CTA bilaterally, No W/R/R. Abdomen: Obese, BS x 4, soft, NT/ND.  Musculoskeletal: No gross deformities, no edema.  Skin: Intact, warm, no rashes.  LABS:  BMET  Recent Labs Lab 04/30/15 1325 05/01/15 0500  NA 143 141  K 3.2* 3.2*  CL 104 105  CO2 27 26  BUN 11 14  CREATININE 1.19* 1.85*  GLUCOSE 159* 121*    Electrolytes  Recent Labs Lab 04/30/15 1325 05/01/15 0500  CALCIUM 9.2 8.6*    CBC  Recent Labs Lab 04/30/15 1325 05/01/15 0500  WBC 10.8* 11.0*  HGB 11.8* 11.0*  HCT 37.1 35.1*  PLT 325 346    Coag's  Recent Labs Lab 04/30/15 2040  INR 1.11    Sepsis Markers No results for input(s): LATICACIDVEN, PROCALCITON, O2SATVEN in the last 168 hours.  ABG No results for input(s): PHART, PCO2ART, PO2ART in the last 168 hours.  Liver Enzymes  Recent Labs Lab 04/30/15 1325  AST 22  ALT 19  ALKPHOS 91  BILITOT 0.6  ALBUMIN 3.5    Cardiac Enzymes No results for input(s): TROPONINI, PROBNP in the last 168 hours.  Glucose  Recent Labs Lab 04/30/15 1325 05/01/15 0012 05/01/15 0650  GLUCAP 124* 104* 117*    Imaging Ct Angio Head W/cm &/or Wo Cm  05/01/2015  CLINICAL DATA:  40 year old hypertensive obese female with sickle cell trait presenting with intracranial hemorrhage. Subsequent encounter. EXAM: CT ANGIOGRAPHY HEAD TECHNIQUE: Multidetector CT imaging of the head was performed using  the standard protocol during bolus administration of intravenous contrast. Multiplanar CT image reconstructions and MIPs were obtained to evaluate the vascular anatomy. CONTRAST:  38mL OMNIPAQUE IOHEXOL 350 MG/ML SOLN COMPARISON:  04/30/2015 head CT.  05/27/2011 head CT. FINDINGS: CT HEAD Brain: Minimal change of hematoma centered in the right caudate region with maximal transverse dimension 2.6 versus 2.2 cm versus prior maximal dimension of 2.6 x 2 cm. Mild surrounding vasogenic  edema. Mass effect upon the right lateral ventricle. Breakthrough of hemorrhage into the lateral ventricles with intraventricular hemorrhage greater involving the right lateral ventricle. Increase in amount of dependent hemorrhage within the left lateral ventricle. Hemorrhage within third and fourth ventricle similar to prior exam. Mild midline shift to the left by 6.1 mm versus prior 4 mm. Prominence of the temporal horns similar to yesterday's exam although different than 05/27/2011 consistent with mild hydrocephalus. Slightly low lying cerebellar tonsils. Although cerebral tonsils were slightly low lying 05/27/2011, patient may not be safe for lumbar puncture. Small vessel disease changes suspected without CT evidence of large acute thrombotic infarct. Calvarium and skull base: Mixed density appearance of the clivus stable since 2013. 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 Paranasal sinuses: Clear. Orbits: Exophthalmos. Prominence of the extra-ocular muscles. Question thyroid ophthalmopathy? CTA HEAD Anterior circulation: No medium or large size vessel significant stenosis. No aneurysm or vascular malformation. Posterior circulation: No medium or large size vessel significant stenosis. No aneurysm or vascular malformation. Venous sinuses: Exam not optimized to evaluate venous structures. Anatomic variants: Negative. Delayed phase:As above. IMPRESSION: No medium or large size intracranial arterial vessel significant stenosis or occlusion. No aneurysm or vascular malformation. Exam not optimized to evaluate venous structures. Minimal change in size of hematoma centered in the right caudate with breakthrough of hemorrhage into the lateral ventricles once again noted. When compared to prior examination, slight increase in degree of midline shift to the left and increase amount of dependent hemorrhage within the left lateral ventricle as detailed above. It is possible hemorrhage is related to  hypertensive hemorrhage. Recommend evaluating this region on follow-up as hemorrhage clears to exclude underlying lesion. Degree of hydrocephalus similar to yesterday's exam. Exophthalmos. Prominence of the extra-ocular muscles. Question thyroid ophthalmopathy? 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 Electronically Signed   By: Genia Del M.D.   On: 05/01/2015 07:18   Ct Head Wo Contrast  04/30/2015  CLINICAL DATA:  40 year old female. Sudden onset of headache. Nausea and vomiting. EXAM: CT HEAD WITHOUT CONTRAST TECHNIQUE: Contiguous axial images were obtained from the base of the skull through the vertex without intravenous contrast. COMPARISON:  07/01/2005 FINDINGS: Unremarkable appearance of the calvarium without acute fracture or aggressive lesion. Unremarkable appearance of the scalp soft tissues. Unremarkable appearance of the bilateral orbits. Mastoid air cells are clear. No significant paranasal sinus disease Acute intracranial hemorrhage, with focus of hemorrhage centered in the right anterior basal ganglia measuring 14 mm x 22 mm. There is intraventricular extension posterior to the caudate, with casting of the right lateral ventricle. Hemorrhage extends from the third ventricle to the anterior horn of the left lateral ventricle as well as through the aqua duct to involve the fourth ventricle. There is early hydrocephalus with expansion of the left posterior horn, and left anterior horn compared to the prior CT. IMPRESSION: Acute right basal gangliar intraparenchymal hemorrhage with intraventricular extension, involving right lateral ventricle, third ventricle, frontal horn of the left lateral ventricle,  as well as the aqua duct and fourth ventricle. Evidence of early hydrocephalus. 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 These results were called by telephone at the time of interpretation on  04/30/2015 at 4:22 pm to Dr. Darlyne Russian , who verbally acknowledged these results. Signed, Dulcy Fanny. Earleen Newport, DO [facility] Electronically Signed   By: Corrie Mckusick D.O.   On: 04/30/2015 16:25     STUDIES:  CT head 03/15 > acute right basal ganglia IPH with IV extension, involving right lateral ventricle, 3rd ventricle, frontal horne of the left lateral ventricle, and aqua duct of the 4th ventricle.  Evidence of early hydrocephalus.  CULTURES: None.  ANTIBIOTICS: None.  SIGNIFICANT EVENTS: 03/15 > admitted with acute IPH due to uncontrolled hypertension.  LINES/TUBES: None.  DISCUSSION: 40 y.o. F with poorly controlled HTN (on single agent therapy with HCTZ), admitted 3/15 with severe headache due to spontaneous IPH in setting HTN  ASSESSMENT / PLAN:  NEUROLOGIC A:   Acute right basal ganglia IPH with ventricular extension, involving right lateral ventricle, 3rd ventricle, frontal horn of the left lateral ventricle, and aquaduct of the 4th ventricle.  Also with evidence of early hydrocephalus.  This is most likely due to uncontrolled hypertension. P:   Tight BP control per cardiovascular section. Neurology following. Neurosurgery evaluating, no plans for ventric drain at this time.   CARDIOVASCULAR A:  Hypertensive emergency - with subsequent spontaneous intraparenchymal hemorrhage 03/15. Hx poorly controlled HTN, HLD. P:  Continue clevidipine gtt - goal SBP 110 - 140 per neuro recs. Add amlodipine 3/16 Labetalol PRN. Hold outpatient HCTZ given renal failure Hold ASA in setting bleed  PULMONARY A: At risk intubation if intraparenchymal hemorrhage worsens / extends. P:   Monitor closely. Pulmonary hygiene.  RENAL A:   Hypokalemia. Acute renal failure, likely HTN injury P:   Replace electrolytes as indicated Follow BMP  GASTROINTESTINAL A:   Obesity. GERD. Nutrition. P:   Pantoprazole. Start  diet 3/16  HEMATOLOGIC A:   Mild microcytic anemia. VTE Prophylaxis. Sickle cell trait. P:  Transfuse for Hgb < 7. SCD's only. CBC in AM.  INFECTIOUS A:   No indication of infection. P:   Monitor clinically.  ENDOCRINE A:   No acute issues.   P:   Monitor glucose on BMP.   Family updated: None.  Interdisciplinary Family Meeting v Palliative Care Meeting:  Due by: 03/21.   Independent CC time 33 minutes   Baltazar Apo, MD, PhD 05/01/2015, 12:09 PM [place] [facility] [phone] or if no answer [phone]

## 2015-05-01 NOTE — Progress Notes (Signed)
PT Cancellation Note  Patient Details Name: Belinda Ramirez MRN: [id number] DOB: 1976/01/31   Cancelled Treatment:    Reason Eval/Treat Not Completed: Patient not medically ready.  Pt currently on bedrest and spoke with RN who indicates pt may be more appropriate tomorrow.  Will f/u tomorrow.     [name], Thornton Papas 05/01/2015, 9:44 AM

## 2015-05-01 NOTE — Progress Notes (Signed)
STROKE TEAM PROGRESS NOTE   HISTORY OF PRESENT ILLNESS Belinda Ramirez is an 40 y.o. female patient who presented to the emergency room, with severe headache, onset was around noon when she is at work. 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 She apparently lifted some heavy boxes around that time following which she noticed severe onset of headache which gradually worsened. She presented to the ER along with a coworker. She also reported having some left upper and lower extremity mild weakness starting around this time. CT of the head done in the ER showed right basal ganglia intracerebral hemorrhage with intraventricular extension of the hemorrhage into the lateral third and fourth ventricles. Patient was given fentanyl to help with the headache which appears to be easing her headache. She is slightly sedated with fentanyl during my evaluation. She denies any vision or speech problems no symptoms involving the right upper and lower extremity.  She has a known hypertension history and is on hydrochlorothiazide 25 mg daily at home, reported good medication compliance in general except that she forgot to take her medication this morning. She is also on aspirin 325 mg daily and denies any anticoagulant use. While she is in the ER, her blood pressure was severely elevated initially up to systolic 123456. She is maxed out on nicardipine drip at 15 mg and her blood pressure still continue to be in 180s when I initially saw her.  Date last known well: 04/30/2015 Time last known well: 1200 tPA Given: No: ICH   SUBJECTIVE (INTERVAL HISTORY) Her RN is at the bedside.  Overall she feels her condition is stable. Repeat CT head at 12h showed stable hematoma and IVH. No hydrocephalus. Passed swallow and will put on po BP meds and wean off cleviprex as able. HTN work up pending.   OBJECTIVE Temp:  [98.3 F (36.8 C)-99 F (37.2 C)] 98.6 F (37 C) (03/16 1200) Pulse Rate:  [69-95] 80 (03/16  1400) Cardiac Rhythm:  [-] Normal sinus rhythm (03/16 0800) Resp:  [16-38] 35 (03/16 1400) BP: (107-226)/(49-104) 138/60 mmHg (03/16 1400) SpO2:  [90 %-100 %] 98 % (03/16 1400) Arterial Line BP: (139-190)/(61-73) 163/70 mmHg (03/16 1400) Weight:  [266 lb 5.1 oz (120.8 kg)] 266 lb 5.1 oz (120.8 kg) (03/15 2330)  CBC:   Recent Labs Lab 04/30/15 1325 05/01/15 0500  WBC 10.8* 11.0*  HGB 11.8* 11.0*  HCT 37.1 35.1*  MCV 70.7* 70.1*  PLT 325 123456    Basic Metabolic Panel:   Recent Labs Lab 04/30/15 1325 05/01/15 0500  NA 143 141  K 3.2* 3.2*  CL 104 105  CO2 27 26  GLUCOSE 159* 121*  BUN 11 14  CREATININE 1.19* 1.85*  CALCIUM 9.2 8.6*    Lipid Panel:     Component Value Date/Time   CHOL 187 05/01/2015 1131   TRIG 80 05/01/2015 1131   HDL 63 05/01/2015 1131   CHOLHDL 3.0 05/01/2015 1131   VLDL 16 05/01/2015 1131   LDLCALC 108* 05/01/2015 1131   HgbA1c: No results found for: HGBA1C Urine Drug Screen:     Component Value Date/Time   LABOPIA NONE DETECTED 05/01/2015 0700   COCAINSCRNUR NONE DETECTED 05/01/2015 0700   LABBENZ NONE DETECTED 05/01/2015 0700   AMPHETMU NONE DETECTED 05/01/2015 0700   THCU NONE DETECTED 05/01/2015 0700   LABBARB NONE DETECTED 05/01/2015 0700      IMAGING I have personally reviewed the radiological images below and agree with the radiology interpretations.  Ct Angio Head  W/cm &/or Wo Cm 05/01/2015   No medium or large size intracranial arterial vessel significant stenosis or occlusion. No aneurysm or vascular malformation. Exam not optimized to evaluate venous structures. Minimal change in size of hematoma centered in the right caudate with breakthrough of hemorrhage into the lateral ventricles once again noted. When compared to prior examination, slight increase in degree of midline shift to the left and increase amount of dependent hemorrhage within the left lateral ventricle as detailed above. It is possible hemorrhage is related to  hypertensive hemorrhage. Recommend evaluating this region on follow-up as hemorrhage clears to exclude underlying lesion. Degree of hydrocephalus similar to yesterday's exam. Exophthalmos. Prominence of the extra-ocular muscles. Question thyroid ophthalmopathy? 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   Ct Head Wo Contrast 04/30/2015    Acute right basal gangliar intraparenchymal hemorrhage with intraventricular extension, involving right lateral ventricle, third ventricle, frontal horn of the left lateral ventricle, as well as the aqua duct and fourth ventricle. Evidence of early hydrocephalus. 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   TTE - - LVEF 60-65%, severely increased LV wall thickness (>1.6 cm),  normal wall motion, diastolic dysfunction with indeterminate LV  filling pressure, mildly dilated RA, normal IVC.  Renal artery duplex - Preliminary findings: Technically difficult study due to body habitus and poor patient postition. No obvious renal artery stenosis identified bilaterally.   PHYSICAL EXAM  Temp:  [98.3 F (36.8 C)-99 F (37.2 C)] 98.6 F (37 C) (03/16 1200) Pulse Rate:  [69-95] 80 (03/16 1400) Resp:  [16-38] 35 (03/16 1400) BP: (107-226)/(49-104) 138/60 mmHg (03/16 1400) SpO2:  [90 %-100 %] 98 % (03/16 1400) Arterial Line BP: (139-190)/(61-73) 163/70 mmHg (03/16 1400) Weight:  [266 lb 5.1 oz (120.8 kg)] 266 lb 5.1 oz (120.8 kg) (03/15 2330)  General - Well nourished, well developed, mildly lethargic and sleepy.  Ophthalmologic - Fundi not visualized due to noncooperation.  Cardiovascular - Regular rate and rhythm with no murmur.  Mental Status -  Level of arousal and orientation to year, age place, and person were intact, but not to month Language including expression, naming, repetition, comprehension was assessed and found intact. Fund of Knowledge was  assessed and was intact.  Cranial Nerves II - XII - II - Visual field intact OU. III, IV, VI - Extraocular movements intact. V - Facial sensation intact bilaterally. VII - Facial movement intact bilaterally. VIII - Hearing & vestibular intact bilaterally. X - Palate elevates symmetrically. XI - Chin turning & shoulder shrug intact bilaterally. XII - Tongue protrusion intact.  Motor Strength - The patient's strength was LUE and LLE 5-/5 and RUE and RLE 5/5, pronator drift was absent.  Bulk was normal and fasciculations were absent.   Motor Tone - Muscle tone was assessed at the neck and appendages and was normal.  Reflexes - The patient's reflexes were 1+ in all extremities and she had no pathological reflexes.  Sensory - Light touch, temperature/pinprick were assessed and were symmetrical.    Coordination - The patient had normal movements in the hands and feet with no ataxia or dysmetria, but slow in action.  Tremor was absent.  Gait and Station - not tested due to HTN needing IV drip.   ASSESSMENT/PLAN Ms. Belinda Ramirez is a 40 y.o. female with history of HTN, HLD, sickle cell, obesity presenting with HA and left UE and LE mild weakness. She did not receive IV t-PA  due to [place].   Right BG ICH with IVH - likely hypertensive bleed  Resultant  Lethargic, mild LUE and LLE weakness  CT head stable hematoma and no hydrocephalus  CTA head - no AVM or aneurysm seen  Carotid Doppler  Not ordered  2D Echo  EF 60-65%  LDL 108  HgbA1c pending  SCDs for VTE prophylaxis Diet regular Room service appropriate?: Yes; Fluid consistency:: Thin  aspirin 325 mg daily prior to admission, now on No antithrombotic due to [place]  Ongoing aggressive stroke risk factor management  Therapy recommendations:  pending  Disposition:  Pending  Hypertension  Unstable  On cleviprex drip now - maxed   Put on amlodipine and lisinopril  BP goal < 160  Secondary HTN work up - renal A. Duplex  negative so far, pending catecholamine, aldosterone, renin and metanephrine  Hyperlipidemia  Home meds: no statin at home  LDL 108, goal < 70  Hold off statin due to acute ICH, will initiate on discharge  Continue statin at discharge  ? AKI on CKD  Cre 1.19->1.80  On IVF  Encourage po input  Check BMP this pm  Other Stroke Risk Factors  Obesity, Body mass index is 45.69 kg/(m^2).   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  Hospital day # 1  This patient is critically ill due to [place] and IVH, malignant HTN, obesity and at significant risk of neurological worsening, death form recurrent ICH, obstructive hydrocephalus, seizure, and cerebral edema. This patient's care requires constant monitoring of vital signs, hemodynamics, respiratory and cardiac monitoring, review of multiple databases, neurological assessment, discussion with family, other specialists and medical decision making of high complexity. I spent 40 minutes of neurocritical care time in the care of this patient.  Rosalin Hawking, MD PhD Stroke Neurology 05/01/2015 2:58 PM     To contact Stroke Continuity provider, please refer to [email or web address] After hours, contact General Neurology

## 2015-05-01 NOTE — Progress Notes (Signed)
*  PRELIMINARY RESULTS* Echocardiogram 2D Echocardiogram has been performed.  Leavy Cella 05/01/2015, 10:52 AM

## 2015-05-02 DIAGNOSIS — I10 Essential (primary) hypertension: Secondary | ICD-10-CM | POA: Insufficient documentation

## 2015-05-02 DIAGNOSIS — E785 Hyperlipidemia, unspecified: Secondary | ICD-10-CM | POA: Insufficient documentation

## 2015-05-02 LAB — GLUCOSE, CAPILLARY: Glucose-Capillary: 92 mg/dL (ref 65–99)

## 2015-05-02 LAB — CBC
HEMATOCRIT: 33.3 % — AB (ref 36.0–46.0)
HEMOGLOBIN: 10.6 g/dL — AB (ref 12.0–15.0)
MCH: 22.1 pg — ABNORMAL LOW (ref 26.0–34.0)
MCHC: 31.8 g/dL (ref 30.0–36.0)
MCV: 69.5 fL — ABNORMAL LOW (ref 78.0–100.0)
Platelets: 351 10*3/uL (ref 150–400)
RBC: 4.79 MIL/uL (ref 3.87–5.11)
RDW: 18.5 % — ABNORMAL HIGH (ref 11.5–15.5)
WBC: 9.6 10*3/uL (ref 4.0–10.5)

## 2015-05-02 LAB — BASIC METABOLIC PANEL
ANION GAP: 6 (ref 5–15)
BUN: 14 mg/dL (ref 6–20)
CO2: 27 mmol/L (ref 22–32)
Calcium: 8.5 mg/dL — ABNORMAL LOW (ref 8.9–10.3)
Chloride: 108 mmol/L (ref 101–111)
Creatinine, Ser: 1.75 mg/dL — ABNORMAL HIGH (ref 0.44–1.00)
GFR calc Af Amer: 41 mL/min — ABNORMAL LOW (ref >60)
GFR, EST NON AFRICAN AMERICAN: 35 mL/min — AB (ref >60)
GLUCOSE: 113 mg/dL — AB (ref 65–99)
POTASSIUM: 3.8 mmol/L (ref 3.5–5.1)
Sodium: 141 mmol/L (ref 135–145)

## 2015-05-02 LAB — HEMOGLOBIN A1C
Hgb A1c MFr Bld: 6.2 % — ABNORMAL HIGH (ref 4.8–5.6)
MEAN PLASMA GLUCOSE: 131 mg/dL

## 2015-05-02 MED ORDER — TRAMADOL HCL 50 MG PO TABS
50.0000 mg | ORAL_TABLET | Freq: Four times a day (QID) | ORAL | Status: DC | PRN
  Administered 2015-05-02 – 2015-05-06 (×6): 50 mg via ORAL
  Filled 2015-05-02 (×6): qty 1

## 2015-05-02 MED ORDER — ACETAMINOPHEN 325 MG PO TABS
650.0000 mg | ORAL_TABLET | ORAL | Status: DC | PRN
  Administered 2015-05-04 – 2015-05-06 (×3): 650 mg via ORAL
  Filled 2015-05-02 (×3): qty 2

## 2015-05-02 MED ORDER — HEPARIN SODIUM (PORCINE) 5000 UNIT/ML IJ SOLN
5000.0000 [IU] | Freq: Three times a day (TID) | INTRAMUSCULAR | Status: DC
  Administered 2015-05-02 – 2015-05-06 (×10): 5000 [IU] via SUBCUTANEOUS
  Filled 2015-05-02 (×11): qty 1

## 2015-05-02 MED ORDER — LABETALOL HCL 5 MG/ML IV SOLN
20.0000 mg | INTRAVENOUS | Status: DC | PRN
  Administered 2015-05-04 (×2): 20 mg via INTRAVENOUS
  Filled 2015-05-02 (×2): qty 4

## 2015-05-02 MED ORDER — PANTOPRAZOLE SODIUM 40 MG PO TBEC
40.0000 mg | DELAYED_RELEASE_TABLET | Freq: Every day | ORAL | Status: DC
  Administered 2015-05-02 – 2015-05-06 (×5): 40 mg via ORAL
  Filled 2015-05-02 (×5): qty 1

## 2015-05-02 NOTE — Progress Notes (Signed)
PULMONARY / CRITICAL CARE MEDICINE   Name: Belinda Ramirez MRN: [id number] DOB: August 16, 1975    ADMISSION DATE:  04/30/2015 CONSULTATION DATE:  04/30/15  REFERRING MD:  Silverio Decamp  CHIEF COMPLAINT:  Headache  HISTORY OF PRESENT ILLNESS:  Pt is encephelopathic; therefore, this HPI is obtained from chart review. Belinda Ramirez is a 40 y.o. female with PMH as outlined below including poorly controlled HTN (although is only on HCTZ).  She was at her job on 03/15 when she bent up to pick up a heavy box around 11:30AM and suddenly developed severe onset of frontal headache.  Headache was extremely painful when it first began and was associated with several episodes of vomiting. She had no photophobia, dizziness, numbness, tingling.  She had missed her dose of HCTZ earlier that morning, otherwise had been compliant with it daily.  Denies any similar symptoms in the past.  In ED, she had CT of the head which revealed acute intraparenchymal hemorrhage with intraventricular extension; likely hypertensive etiology.  Initial BP in ED was 211/98.  Neurology and neurosurgery were both consulted and per neuro recs, BP goal was [id number] systolic.  She was started on nicardipine gtt and despite maximal dosing, SBP remained around 150.  She was subsequently switched to clevidipine and PCCM was consulted for assistance in BP control.  SUBJECTIVE:     VITAL SIGNS: BP 148/89 mmHg  Pulse 64  Temp(Src) 98.5 F (36.9 C) (Oral)  Resp 18  Ht 5\' 4"  (1.626 m)  Wt 120.8 kg (266 lb 5.1 oz)  BMI 45.69 kg/m2  SpO2 94%  HEMODYNAMICS:    VENTILATOR SETTINGS:    INTAKE / OUTPUT: I/O last 3 completed shifts: In: 2648.5 [I.V.:2048.5; IV Piggyback:600] Out: 1100 [Urine:1100]   PHYSICAL EXAMINATION: General: Young adult AA female, in NAD. Neuro: sleepy but wakes, O x 3, non-focal. HEENT: [place]/AT. PERRL, sclerae anicteric. Cardiovascular: RRR, no M/R/G.  Lungs: Respirations even and unlabored.  CTA  bilaterally, No W/R/R. Abdomen: Obese, BS x 4, soft, NT/ND.  Musculoskeletal: No gross deformities, no edema.  Skin: Intact, warm, no rashes.  LABS:  BMET  Recent Labs Lab 05/01/15 0500 05/01/15 1827 05/02/15 0550  NA 141 138 141  K 3.2* 3.4* 3.8  CL 105 103 108  CO2 26 23 27   BUN 14 16 14   CREATININE 1.85* 1.91* 1.75*  GLUCOSE 121* 140* 113*    Electrolytes  Recent Labs Lab 05/01/15 0500 05/01/15 1827 05/02/15 0550  CALCIUM 8.6* 8.6* 8.5*    CBC  Recent Labs Lab 04/30/15 1325 05/01/15 0500 05/02/15 0550  WBC 10.8* 11.0* 9.6  HGB 11.8* 11.0* 10.6*  HCT 37.1 35.1* 33.3*  PLT 325 346 351    Coag's  Recent Labs Lab 04/30/15 2040  INR 1.11    Sepsis Markers No results for input(s): LATICACIDVEN, PROCALCITON, O2SATVEN in the last 168 hours.  ABG No results for input(s): PHART, PCO2ART, PO2ART in the last 168 hours.  Liver Enzymes  Recent Labs Lab 04/30/15 1325  AST 22  ALT 19  ALKPHOS 91  BILITOT 0.6  ALBUMIN 3.5    Cardiac Enzymes No results for input(s): TROPONINI, PROBNP in the last 168 hours.  Glucose  Recent Labs Lab 05/01/15 0012 05/01/15 0650 05/01/15 1206 05/01/15 1758 05/01/15 2321 05/02/15 0632  GLUCAP 104* 117* 115* 132* 118* 92    Imaging No results found.   STUDIES:  CT head 03/15 > acute right basal ganglia IPH with IV extension, involving right lateral ventricle, 3rd ventricle, frontal  horne of the left lateral ventricle, and aqua duct of the 4th ventricle.  Evidence of early hydrocephalus.  CULTURES: None.  ANTIBIOTICS: None.  SIGNIFICANT EVENTS: 03/15 > admitted with acute IPH due to uncontrolled hypertension.  LINES/TUBES: None.  DISCUSSION: 40 y.o. F with poorly controlled HTN (on single agent therapy with HCTZ), admitted 3/15 with severe headache due to spontaneous IPH in setting HTN  ASSESSMENT / PLAN:  NEUROLOGIC A:   Acute right basal ganglia IPH with ventricular extension, involving  right lateral ventricle, 3rd ventricle, frontal horn of the left lateral ventricle, and aquaduct of the 4th ventricle.  Also with evidence of early hydrocephalus.  This is most likely due to uncontrolled hypertension. P:   Tight BP control per cardiovascular section. Neurology following. Neurosurgery evaluating, no plans for ventric drain at this time.   CARDIOVASCULAR A:  Hypertensive emergency - with subsequent spontaneous intraparenchymal hemorrhage 03/15. Hx poorly controlled HTN, HLD. P:  clevidipine gtt is off  Added amlodipine 3/16 Labetalol PRN. Hold outpatient HCTZ given renal failure, probably restart 3/18 Hold ASA in setting bleed  PULMONARY A: At some risk intubation if intraparenchymal hemorrhage worsens / extends. P:   Monitor closely. Pulmonary hygiene.  RENAL A:   Hypokalemia. Acute renal failure, likely HTN injury, improving P:   Replace electrolytes as indicated Follow BMP  GASTROINTESTINAL A:   Obesity. GERD. Nutrition. P:   Pantoprazole. Started diet 3/16  HEMATOLOGIC A:   Mild microcytic anemia. VTE Prophylaxis. Sickle cell trait. P:  Transfuse for Hgb < 7. SCD's only. Follow CBC  INFECTIOUS A:   No indication of infection. P:   Monitor clinically.  ENDOCRINE A:   No acute issues.   P:   Monitor glucose on BMP.   Family updated: None.  Interdisciplinary Family Meeting v Palliative Care Meeting:  Due by: 03/21.   PCCM will sign off, please call if we can help   Baltazar Apo, MD, PhD 05/02/2015, 9:01 AM [facility] [phone] or if no answer [phone]

## 2015-05-02 NOTE — Progress Notes (Signed)
Dr. Erlinda Hong at bedside. Orders to  attempt to wean cleviprex gtt and SBP goal now <160

## 2015-05-02 NOTE — Progress Notes (Signed)
[facility] Progress Note Patient Name: Belinda Ramirez DOB: 31-Aug-1975 MRN: [id number]   Date of Service  05/02/2015  HPI/Events of Note  headache  eICU Interventions  Ultram, tylenol prn     Intervention Category Intermediate Interventions: Pain - evaluation and management  Simonne Maffucci 05/02/2015, 6:37 PM

## 2015-05-02 NOTE — Clinical Documentation Improvement (Signed)
Neuro Surgery  Abnormal diagnostic findings (MRI scans, CT scans, tec.) are not coded and reported unless the physician indicates their clinical significance.  Possible Clinical Conditions:  Cerebral Edema, Cytoxic Edema, Vasogenic Edema, including suspected or known cause and associated condition(s).  Herniation, including type - uncal, transtentorial, or other type of herniation, including suspected or known cause and associated condition(s).  Other  Clinically Undetermined  Document any associated diagnoses/conditions.  Supporting Information: 05/01/15 CT imaging of the head : Brain: Minimal change of hematoma centered in the right caudate region with maximal transverse dimension 2.6 versus 2.2 cm versus prior maximal dimension of 2.6 x 2 cm. Mild surrounding vasogenic edema. Mass effect upon the right lateral ventricle. Mild midline shift to the left by 6.1 mm versus prior 4 mm. Prominence of the temporal horns similar to yesterday's exam although different than 05/27/2011 consistent with mild Hydrocephalus.  H&P: severe onset of headache, left upper and lower extremity mild weakness, Nausea and vomiting, elevated BP of 210/98-map of 137 Treatment:clevidipine infusion and titrate,  PRN labetalol, Frequent neuro checks,  Please exercise your independent, professional judgment when responding. A specific answer is not anticipated or expected. Please update your documentation within the medical record to reflect your response to this query. Thank you  Thank You,  Belinda Ramirez [phone]

## 2015-05-02 NOTE — Progress Notes (Signed)
Notified CCM Md about pt's pain score. Pt is resting comfortably and able to fall asleep and stay at sleep easily. No new orders given. Will continue to assess pt for changes in pain and the need for pain meds per CCM Md.

## 2015-05-02 NOTE — Progress Notes (Signed)
Patient arrived to floor from 55M ICU. Patient alert and oriented. Patient given room information and call bell system placed at bedside. Bed alarm on. Will continue to monitor.

## 2015-05-02 NOTE — Progress Notes (Signed)
STROKE TEAM PROGRESS NOTE   SUBJECTIVE (INTERVAL HISTORY) Her RN is at the bedside.  Overall she feels her condition is stable. Off cleviprex now and on po BP meds. Stable, will repeat head CT to rule out hydrocephalus.   OBJECTIVE Temp:  [98.1 F (36.7 C)-98.8 F (37.1 C)] 98.5 F (36.9 C) (03/17 1600) Pulse Rate:  [54-145] 66 (03/17 1900) Cardiac Rhythm:  [-] Normal sinus rhythm (03/17 1200) Resp:  [12-40] 20 (03/17 1900) BP: (102-163)/(48-99) 142/80 mmHg (03/17 1900) SpO2:  [90 %-98 %] 95 % (03/17 1900) Arterial Line BP: (162-177)/(77-90) 177/82 mmHg (03/16 2145)  CBC:   Recent Labs Lab 05/01/15 0500 05/02/15 0550  WBC 11.0* 9.6  HGB 11.0* 10.6*  HCT 35.1* 33.3*  MCV 70.1* 69.5*  PLT 346 [id number]    Basic Metabolic Panel:   Recent Labs Lab 05/01/15 1827 05/02/15 0550  NA 138 141  K 3.4* 3.8  CL 103 108  CO2 23 27  GLUCOSE 140* 113*  BUN 16 14  CREATININE 1.91* 1.75*  CALCIUM 8.6* 8.5*    Lipid Panel:     Component Value Date/Time   CHOL 187 05/01/2015 1131   TRIG 80 05/01/2015 1131   HDL 63 05/01/2015 1131   CHOLHDL 3.0 05/01/2015 1131   VLDL 16 05/01/2015 1131   LDLCALC 108* 05/01/2015 1131   HgbA1c:  Lab Results  Component Value Date   HGBA1C 6.2* 05/01/2015   Urine Drug Screen:     Component Value Date/Time   LABOPIA NONE DETECTED 05/01/2015 0700   COCAINSCRNUR NONE DETECTED 05/01/2015 0700   LABBENZ NONE DETECTED 05/01/2015 0700   AMPHETMU NONE DETECTED 05/01/2015 0700   THCU NONE DETECTED 05/01/2015 0700   LABBARB NONE DETECTED 05/01/2015 0700      IMAGING I have personally reviewed the radiological images below and agree with the radiology interpretations.  Ct Angio Head W/cm &/or Wo Cm 05/01/2015   No medium or large size intracranial arterial vessel significant stenosis or occlusion. No aneurysm or vascular malformation. Exam not optimized to evaluate venous structures. Minimal change in size of hematoma centered in the right caudate  with breakthrough of hemorrhage into the lateral ventricles once again noted. When compared to prior examination, slight increase in degree of midline shift to the left and increase amount of dependent hemorrhage within the left lateral ventricle as detailed above. It is possible hemorrhage is related to hypertensive hemorrhage. Recommend evaluating this region on follow-up as hemorrhage clears to exclude underlying lesion. Degree of hydrocephalus similar to yesterday's exam. Exophthalmos. Prominence of the extra-ocular muscles. Question thyroid ophthalmopathy? 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   Ct Head Wo Contrast 04/30/2015    Acute right basal gangliar intraparenchymal hemorrhage with intraventricular extension, involving right lateral ventricle, third ventricle, frontal horn of the left lateral ventricle, as well as the aqua duct and fourth ventricle. Evidence of early hydrocephalus. 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   TTE - - LVEF 60-65%, severely increased LV wall thickness (>1.6 cm),  normal wall motion, diastolic dysfunction with indeterminate LV  filling pressure, mildly dilated RA, normal IVC.  Renal artery duplex - Preliminary findings: Technically difficult study due to body habitus and poor patient postition. No obvious renal artery stenosis identified bilaterally.  Repeat CT head - pending   PHYSICAL EXAM  Temp:  [98.1 F (36.7 C)-98.8 F (37.1 C)] 98.5 F (36.9 C) (03/17 1600) Pulse Rate:  [54-145]  66 (03/17 1900) Resp:  [12-40] 20 (03/17 1900) BP: (102-163)/(48-99) 142/80 mmHg (03/17 1900) SpO2:  [90 %-98 %] 95 % (03/17 1900) Arterial Line BP: (162-177)/(77-90) 177/82 mmHg (03/16 2145)  General - Well nourished, well developed, not in acute distress.  Ophthalmologic - Fundi not visualized due to noncooperation.  Cardiovascular - Regular rate and rhythm  with no murmur.  Mental Status -  Level of arousal and orientation to time, age, place, and person were intact. Language including expression, naming, repetition, comprehension was assessed and found intact. Fund of Knowledge was assessed and was intact.  Cranial Nerves II - XII - II - Visual field intact OU. III, IV, VI - Extraocular movements intact. V - Facial sensation intact bilaterally. VII - Facial movement intact bilaterally. VIII - Hearing & vestibular intact bilaterally. X - Palate elevates symmetrically. XI - Chin turning & shoulder shrug intact bilaterally. XII - Tongue protrusion intact.  Motor Strength - The patient's strength was LUE and LLE 5-/5 and RUE and RLE 5/5, pronator drift was absent.  Bulk was normal and fasciculations were absent.   Motor Tone - Muscle tone was assessed at the neck and appendages and was normal.  Reflexes - The patient's reflexes were 1+ in all extremities and she had no pathological reflexes.  Sensory - Light touch, temperature/pinprick were assessed and were symmetrical.    Coordination - The patient had normal movements in the hands and feet with no ataxia or dysmetria, but slow in action.  Tremor was absent.  Gait and Station - not tested due to HTN needing IV drip.   ASSESSMENT/PLAN Ms. Belinda Ramirez is a 40 y.o. female with history of HTN, HLD, sickle cell, obesity presenting with HA and left UE and LE mild weakness. She did not receive IV t-PA due to [place].   Right BG ICH with IVH - likely hypertensive bleed  Resultant  mild LUE and LLE weakness, improving  CT head stable hematoma and no hydrocephalus  CTA head - no AVM or aneurysm seen  Repeat CT head in am to evaluate hydrocephalus  Carotid Doppler  Not ordered  2D Echo  EF 60-65%  LDL 108  HgbA1c 6.2  SCDs and subq heparin for VTE prophylaxis Diet regular Room service appropriate?: Yes; Fluid consistency:: Thin  aspirin 325 mg daily prior to admission, now on  No antithrombotic due to [place].  Ongoing aggressive stroke risk factor management  Therapy recommendations:  pending  Disposition:  Pending  Hypertension  Uncontrolled at home  Off cleviprex drip now  On amlodipine and lisinopril  BP goal < 160  Secondary HTN work up - renal A. Duplex negative so far, pending catecholamine, aldosterone, renin and metanephrine  Hyperlipidemia  Home meds: no statin at home  LDL 108, goal < 70  Hold off statin due to acute ICH, will initiate on discharge  Continue statin at discharge  ? AKI on CKD  Cre 1.19->1.80->1.70  Encourage po input  Check BMP this pm  Other Stroke Risk Factors  Obesity, Body mass index is 45.69 kg/(m^2).   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  Hospital day # 2  This patient is critically ill due to [place] and IVH, malignant HTN, obesity and at significant risk of neurological worsening, death form recurrent ICH, obstructive hydrocephalus, seizure, and cerebral edema. This patient's care requires constant monitoring of vital signs, hemodynamics, respiratory and cardiac monitoring, review of multiple databases, neurological assessment, discussion with family, other specialists and medical  decision making of high complexity. I spent 35 minutes of neurocritical care time in the care of this patient.  Rosalin Hawking, MD PhD Stroke Neurology 05/02/2015 7:39 PM     To contact Stroke Continuity provider, please refer to [email or web address] After hours, contact General Neurology

## 2015-05-02 NOTE — Progress Notes (Signed)
PT recommending SNF at dc based on evaluation earlier today.  CSW consulted to facilitate possible SNF when medically stable for dc.  Will follow progress.    Reinaldo Raddle, RN, BSN  Trauma/Neuro ICU Case Manager [phone]

## 2015-05-02 NOTE — Evaluation (Signed)
Physical Therapy Evaluation Patient Details Name: Belinda Ramirez MRN: [id number] DOB: [birth year] Today's Date: 05/02/2015   History of Present Illness  pt presents with HTN, Basal Ganglia ICH with Intraventricular Extension.  pt with hx of HTN and Sickle Cell.  Clinical Impression  Pt with very poor awareness of deficits and safety.  Pt is ambulatory, but demonstrates balance deficits when presented with cognitive challenge.  Attempted pathfinding back to her room and was told her room number x2.  Pt ambulated past her room and then began to read the next 2 consecutive room numbers and was unable to problem solve where room number 1 should be in relation to rooms 2 and 3.  Pt continued to read room numbers out loud and at room 5 stopped and said "wait a minute".  Cues needed to direct pt back to her room.  At this time feel pt is not safe for return to home, unless she can have 24hr care.  Feel pt is most appropriate for SNF level of care for continued therapies for safety and continued nursing A, as pt will not be able to manage her own meds or home management tasks.      Follow Up Recommendations SNF    Equipment Recommendations  None recommended by PT    Recommendations for Other Services       Precautions / Restrictions Precautions Precautions: Fall Restrictions Weight Bearing Restrictions: No      Mobility  Bed Mobility Overal bed mobility: Needs Assistance Bed Mobility: Rolling;Sidelying to Sit Rolling: Min assist Sidelying to sit: Min assist       General bed mobility comments: A with bringing trunk up to sitting.    Transfers Overall transfer level: Needs assistance Equipment used: None Transfers: Sit to/from [facility] Sit to Stand: Min guard Stand pivot transfers: Min guard       General transfer comment: Pt moves very slowly and guarded   Ambulation/Gait Ambulation/Gait assistance: Min guard Ambulation Distance (Feet): 200  Feet Assistive device: None Gait Pattern/deviations: Step-through pattern;Decreased stride length     General Gait Details: pt moves very slowly, and when asked if this was normal for her she states "No".  When asked to show PT her normal gait, pt makes no changes in her ambulation and says she feels "about normal".  pt has difficulty maintaining her attention on task and has to stop when asked to read signage in hallway.  Attempted pathfinding and pt unable to find her room even when given her room number x2.  pt would read room numbers out loud, but was unable to problem solve where room 1 would be in relation to rooms 2 and 3, and walked into the wall on R side when she started to walk again after reading room numbers.    Stairs Stairs: Yes Stairs assistance: Min assist Stair Management: One rail Right;Step to pattern;Forwards Number of Stairs: 3 General stair comments: pt needs consistent MinA and close guarding for safety on stairs.  pt needs frequent cueing for stair technique as she indicates her L LE is weaker and was unable to follow the direction to step down with her L leg first.    Wheelchair Mobility    Modified Rankin (Stroke Patients Only) Modified Rankin (Stroke Patients Only) Pre-Morbid Rankin Score: No symptoms Modified Rankin: Moderately severe disability     Balance Overall balance assessment: Needs assistance Sitting-balance support: Feet supported Sitting balance-Leahy Scale: Good     Standing balance support: During functional activity  Standing balance-Leahy Scale: Fair                               Pertinent Vitals/Pain Pain Assessment: No/denies pain    Home Living Family/patient expects to be discharged to:: Private residence Living Arrangements: Alone Available Help at Discharge:  (None) Type of Home: House Home Access: Stairs to enter Entrance Stairs-Rails: Right Entrance Stairs-Number of Steps: "Several" Home Layout: One  level Home Equipment: None Additional Comments: Pt states that someone can stay with her at discharge, but is unable to state who that would be.  She reports not family in the area.  her brother is here from [place], [place].  He works full time.     Prior Function Level of Independence: Independent         Comments: Pt states she works 3 jobs.  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[facility], but is unable to tell therapist what her other jobs are, nor how many hours/week she works      Journalist, newspaper   Dominant Hand: Right    Extremity/Trunk Assessment   Upper Extremity Assessment: Generalized weakness           Lower Extremity Assessment: Defer to PT evaluation      Cervical / Trunk Assessment: Normal  Communication   Communication: Other (comment) (low volume )  Cognition Arousal/Alertness: Awake/alert Behavior During Therapy: Flat affect Overall Cognitive Status: Impaired/Different from baseline Area of Impairment: Orientation;Following commands;Safety/judgement;Awareness;Problem solving;Attention   Current Attention Level: Sustained (with cues ) Memory: Decreased short-term memory Following Commands: Follows one step commands consistently;Follows one step commands with increased time Safety/Judgement: Decreased awareness of safety;Decreased awareness of deficits Awareness: Intellectual Problem Solving: Slow processing;Decreased initiation;Difficulty sequencing;Requires verbal cues;Requires tactile cues General Comments: Pt is very slow to initiate activity or movement.  She is unable to accurately provide info re: PLOF.  Requires mod verbal cues for problem solving with simple ADLs     General Comments General comments (skin integrity, edema, etc.): Pt's brother present during end of eval.  Instructed he and pt re: need for 24 hour assist at discharge    Exercises        Assessment/Plan    PT Assessment Patient needs continued PT services  PT Diagnosis  Difficulty walking;Generalized weakness   PT Problem List Decreased strength;Decreased activity tolerance;Decreased balance;Decreased mobility;Decreased coordination;Decreased cognition;Decreased knowledge of use of DME;Decreased safety awareness  PT Treatment Interventions DME instruction;Gait training;Stair training;Functional mobility training;Therapeutic activities;Therapeutic exercise;Balance training;Neuromuscular re-education;Cognitive remediation;Patient/family education   PT Goals (Current goals can be found in the Care Plan section) Acute Rehab PT Goals Patient Stated Goal: Pt did not state  PT Goal Formulation: With patient Time For Goal Achievement: 05/16/15 Potential to Achieve Goals: Good    Frequency Min 4X/week   Barriers to discharge Decreased caregiver support      Co-evaluation               End of Session Equipment Utilized During Treatment: Gait belt Activity Tolerance: Patient tolerated treatment well Patient left:  (up with OT) Nurse Communication: Mobility status         Time: [id number] PT Time Calculation (min) (ACUTE ONLY): 24 min   Charges:   PT Evaluation $PT Eval Moderate Complexity: 1 Procedure PT Treatments $Gait Training: 8-22 mins   PT G CodesCatarina Hartshorn, [place] 05/02/2015, 1:55 PM

## 2015-05-02 NOTE — Evaluation (Signed)
Speech Language Pathology Evaluation Patient Details Name: Belinda Ramirez MRN: [id number] DOB: [birth year] Today's Date: 05/02/2015 Time: 1440-1500 SLP Time Calculation (min) (ACUTE ONLY): 20 min  Problem List:  Patient Active Problem List   Diagnosis Date Noted  . Intraparenchymal hematoma of brain ([place]) 04/30/2015  . ICH (intracerebral hemorrhage) ([place]) 04/30/2015  . Hypertensive emergency   . Hyperlipidemia   . Obesity   . AKI (acute kidney injury) ([place])   . Hypokalemia    Past Medical History:  Past Medical History  Diagnosis Date  . Hypertension   . High cholesterol   . Sickle cell trait ([place])   . Obesity    Past Surgical History:  Past Surgical History  Procedure Laterality Date  . Tubal ligation     HPI:  40 year old female admitted with ICH (CT head 03/15 > acute right basal ganglia IPH with IV extension, involving right lateral ventricle, 3rd ventricle, frontal horne of the left lateral ventricle, and aqua duct of the 4th ventricle) with intraventricular extension and concern for hydrocephalus.    Assessment / Plan / Recommendation Clinical Impression  Patient presents with a moderate cognitive-linguistic impairment, however she was very lethargic and difficult to maintain adequate arousal during this evaluation. SLP reviewed PT evaluation which noted that patient had decreased safety awareness and demonstrated impaired functional problem solving and reasoning when ambulating earlier today. Patient stated that she was tired from having had physical therapy session. Patient exhibited contradictory statements, first stating that she had not had any therapy, but later stating that she had been walking with therapy. More comprehensive cognitive-linguistic evaluation during further treatment sessions will help to determine specific deficits, as patient was not able to complete a more in depth assesssment today.    SLP Assessment  Patient needs continued Speech Lanaguage  Pathology Services    Follow Up Recommendations  Other (comment) (pending progress)    Frequency and Duration min 2x/week  2 weeks      SLP Evaluation Prior Functioning  Type of Home: House  Lives With: Other (Comment) (patient stated that she lives alone)   Cognition  Overall Cognitive Status: Impaired/Different from baseline Orientation Level: Oriented to place;Oriented to person;Oriented to time;Disoriented to situation Attention: Sustained Sustained Attention: Impaired Sustained Attention Impairment: Verbal basic Memory: Impaired Memory Impairment: Storage deficit;Decreased recall of new information Awareness: Impaired Awareness Impairment: Intellectual impairment Problem Solving: Impaired Problem Solving Impairment: Verbal basic Executive Function: Initiating Initiating: Impaired Initiating Impairment: Verbal basic Safety/Judgment: Impaired    Comprehension  Auditory Comprehension Overall Auditory Comprehension: Impaired Interfering Components: Attention;Processing speed    Expression Expression Primary Mode of Expression: Verbal Verbal Expression Overall Verbal Expression: Appears within functional limits for tasks assessed   Oral / Motor  Oral Motor/Sensory Function Overall Oral Motor/Sensory Function: Within functional limits   GO                    Dannial Monarch 05/02/2015, 4:53 PM    Sonia Baller, MA, CCC-SLP 05/02/2015 4:53 PM

## 2015-05-03 ENCOUNTER — Inpatient Hospital Stay (HOSPITAL_COMMUNITY): Payer: No Typology Code available for payment source

## 2015-05-03 LAB — BASIC METABOLIC PANEL
ANION GAP: 13 (ref 5–15)
BUN: 10 mg/dL (ref 6–20)
CHLORIDE: 107 mmol/L (ref 101–111)
CO2: 23 mmol/L (ref 22–32)
Calcium: 9.1 mg/dL (ref 8.9–10.3)
Creatinine, Ser: 1.18 mg/dL — ABNORMAL HIGH (ref 0.44–1.00)
GFR calc Af Amer: >60 mL/min (ref >60)
GFR calc non Af Amer: 56 mL/min — ABNORMAL LOW (ref >60)
GLUCOSE: 91 mg/dL (ref 65–99)
POTASSIUM: 3.6 mmol/L (ref 3.5–5.1)
Sodium: 143 mmol/L (ref 135–145)

## 2015-05-03 LAB — CBC
HEMATOCRIT: 36.6 % (ref 36.0–46.0)
HEMOGLOBIN: 11.6 g/dL — AB (ref 12.0–15.0)
MCH: 22.4 pg — ABNORMAL LOW (ref 26.0–34.0)
MCHC: 31.7 g/dL (ref 30.0–36.0)
MCV: 70.7 fL — AB (ref 78.0–100.0)
Platelets: 345 10*3/uL (ref 150–400)
RBC: 5.18 MIL/uL — ABNORMAL HIGH (ref 3.87–5.11)
RDW: 18.5 % — AB (ref 11.5–15.5)
WBC: 8.2 10*3/uL (ref 4.0–10.5)

## 2015-05-03 NOTE — Progress Notes (Signed)
STROKE TEAM PROGRESS NOTE  History At Time of Admission Belinda Ramirez is an 40 y.o. female patient who presented to the emergency room, with severe headache, onset was around noon when she is at work. 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 She apparently lifted some heavy boxes around that time following which she noticed severe onset of headache which gradually worsened. She presented to the ER along with a coworker. She also reported having some left upper and lower extremity mild weakness starting around this time. CT of the head done in the ER showed right basal ganglia intracerebral hemorrhage with intraventricular extension of the hemorrhage into the lateral third and fourth ventricles. Patient was given fentanyl to help with the headache which appears to be easing her headache. She is slightly sedated with fentanyl during my evaluation. She denies any vision or speech problems no symptoms involving the right upper and lower extremity.  She has a known hypertension history and is on hydrochlorothiazide 25 mg daily at home, reported good medication compliance in general except that she forgot to take her medication this morning. She is also on aspirin 325 mg daily and denies any anticoagulant use. While she is in the ER, her blood pressure was severely elevated initially up to systolic [id number]. She is maxed out on nicardipine drip at 15 mg and her blood pressure still continue to be in 180s when I initially saw her.  Date last known well: 04/30/2015 Time last known well: 1200 tPA Given: No: ICH     SUBJECTIVE (INTERVAL HISTORY) Overall she feels her condition is stable. Off cleviprex now and on po BP meds. Stable head CT today. Pending disposition. Friend at bedside.   OBJECTIVE Temp:  [97.5 F (36.4 C)-99.5 F (37.5 C)] 98.6 F (37 C) (03/18 0636) Pulse Rate:  [58-145] 61 (03/18 0636) Cardiac Rhythm:  [-] Normal sinus rhythm (03/17 2042) Resp:  [18-32] 24 (03/18 0636) BP:  (119-195)/(56-85) 195/85 mmHg (03/18 0636) SpO2:  [92 %-98 %] 98 % (03/18 0636) Weight:  [125.147 kg (275 lb 14.4 oz)] 125.147 kg (275 lb 14.4 oz) (03/17 2023)  CBC:   Recent Labs Lab 05/02/15 0550 05/03/15 0614  WBC 9.6 8.2  HGB 10.6* 11.6*  HCT 33.3* 36.6  MCV 69.5* 70.7*  PLT 351 [id number]    Basic Metabolic Panel:   Recent Labs Lab 05/02/15 0550 05/03/15 0614  NA 141 143  K 3.8 3.6  CL 108 107  CO2 27 23  GLUCOSE 113* 91  BUN 14 10  CREATININE 1.75* 1.18*  CALCIUM 8.5* 9.1    Lipid Panel:     Component Value Date/Time   CHOL 187 05/01/2015 1131   TRIG 80 05/01/2015 1131   HDL 63 05/01/2015 1131   CHOLHDL 3.0 05/01/2015 1131   VLDL 16 05/01/2015 1131   LDLCALC 108* 05/01/2015 1131   HgbA1c:  Lab Results  Component Value Date   HGBA1C 6.2* 05/01/2015   Urine Drug Screen:     Component Value Date/Time   LABOPIA NONE DETECTED 05/01/2015 0700   COCAINSCRNUR NONE DETECTED 05/01/2015 0700   LABBENZ NONE DETECTED 05/01/2015 0700   AMPHETMU NONE DETECTED 05/01/2015 0700   THCU NONE DETECTED 05/01/2015 0700   LABBARB NONE DETECTED 05/01/2015 0700      IMAGING I have personally reviewed the radiological images below and agree with the radiology interpretations.  Ct Angio Head W/cm &/or Wo Cm 05/01/2015   No medium or large size intracranial arterial vessel significant stenosis or  occlusion. No aneurysm or vascular malformation. Exam not optimized to evaluate venous structures. Minimal change in size of hematoma centered in the right caudate with breakthrough of hemorrhage into the lateral ventricles once again noted. When compared to prior examination, slight increase in degree of midline shift to the left and increase amount of dependent hemorrhage within the left lateral ventricle as detailed above. It is possible hemorrhage is related to hypertensive hemorrhage. Recommend evaluating this region on follow-up as hemorrhage clears to exclude underlying lesion.  Degree of hydrocephalus similar to yesterday's exam. Exophthalmos. Prominence of the extra-ocular muscles. Question thyroid ophthalmopathy? 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    Ct Head Wo Contrast 04/30/2015    Acute right basal gangliar intraparenchymal hemorrhage with intraventricular extension, involving right lateral ventricle, third ventricle, frontal horn of the left lateral ventricle, as well as the aqua duct and fourth ventricle. Evidence of early hydrocephalus. 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    TTE - - LVEF 60-65%, severely increased LV wall thickness (>1.6 cm),  normal wall motion, diastolic dysfunction with indeterminate LV  filling pressure, mildly dilated RA, normal IVC.   Renal artery duplex - Preliminary findings: Technically difficult study due to body habitus and poor patient postition. No obvious renal artery stenosis identified bilaterally.  Repeat CT head 05/03/2015  Right anterior basal ganglia with hemorrhage is stable. Intraventricular hemorrhage slightly improved.  Mild hydrocephalus and midline shift stable from the prior study. No new hemorrhage since yesterday.     PHYSICAL EXAM  Temp:  [97.5 F (36.4 C)-99.5 F (37.5 C)] 98.6 F (37 C) (03/18 0636) Pulse Rate:  [58-145] 61 (03/18 0636) Resp:  [18-32] 24 (03/18 0636) BP: (119-195)/(56-85) 195/85 mmHg (03/18 0636) SpO2:  [92 %-98 %] 98 % (03/18 0636) Weight:  [125.147 kg (275 lb 14.4 oz)] 125.147 kg (275 lb 14.4 oz) (03/17 2023)  General - Well nourished, well developed, not in acute distress.  Ophthalmologic - Fundi not visualized due to noncooperation.  Cardiovascular - Regular rate and rhythm with no murmur.  Mental Status -  Level of arousal and orientation to time, age, place, and person were intact. Language including expression, naming, repetition, comprehension was  assessed and found intact. Fund of Knowledge was assessed and was intact.  Cranial Nerves II - XII - II - Visual field intact OU. III, IV, VI - Extraocular movements intact. V - Facial sensation intact bilaterally. VII - Facial movement intact bilaterally. VIII - Hearing & vestibular intact bilaterally. X - Palate elevates symmetrically. XI - Chin turning & shoulder shrug intact bilaterally. XII - Tongue protrusion intact.  Motor Strength - The patient's strength was LUE and LLE 5-/5 and RUE and RLE 5/5, pronator drift was absent.  Bulk was normal and fasciculations were absent.   Motor Tone - Muscle tone was assessed at the neck and appendages and was normal.  Reflexes - The patient's reflexes were 1+ in all extremities and she had no pathological reflexes.  Sensory - Light touch, temperature/pinprick were assessed and were symmetrical.    Coordination - The patient had normal movements in the hands and feet with no ataxia or dysmetria, but slow in action.  Tremor was absent.  Gait and Station - not tested due to HTN needing IV drip.   ASSESSMENT/PLAN Belinda Ramirez is a 40 y.o. female with history of HTN, HLD, sickle cell, obesity presenting with HA and left UE and  LE mild weakness. She did not receive IV t-PA due to [place].   Right BG ICH with IVH - likely hypertensive bleed  Resultant  mild LUE and LLE weakness, improving  CT head stable hematoma and no hydrocephalus  CTA head - no AVM or aneurysm seen  Head CT repeat - 05/03/2015 - stable (see above)  Carotid Doppler  Not ordered  2D Echo  EF 60-65%  LDL 108  HgbA1c 6.2  SCDs and subq heparin for VTE prophylaxis Diet regular Room service appropriate?: Yes; Fluid consistency:: Thin  aspirin 325 mg daily prior to admission, now on No antithrombotic due to [place].  Ongoing aggressive stroke risk factor management  Therapy recommendations:  SNF recommended  Disposition:  Pending  Hypertension  Uncontrolled  at home  Off cleviprex drip now  On amlodipine and lisinopril  BP goal < 160  Secondary HTN work up - renal A. Duplex negative so far, pending catecholamine, aldosterone, renin and metanephrine  Hyperlipidemia  Home meds: no statin at home  LDL 108, goal < 70  Hold off statin due to acute ICH, will initiate on discharge  Continue statin at discharge  ? AKI on CKD  Cre 1.91 -> 1.80 -> 1.70  -> 1.75  -> 1.18 improving  Encourage po input  Check BMP this pm  Other Stroke Risk Factors  Obesity, Body mass index is 47.33 kg/(m^2).   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  Headache due to BG bleed with vasogenic edema  Hospital day # 3   Personally examined patient and images, and have participated in and made any corrections needed to history, physical, neuro exam,assessment and plan as stated above.  I have personally obtained the history, evaluated lab date, reviewed imaging studies and agree with radiology interpretations.    Sarina Ill, MD Stroke Neurology [phone] [facility]        To contact Stroke Continuity provider, please refer to [email or web address] After hours, contact General Neurology

## 2015-05-03 NOTE — Progress Notes (Signed)
Occupational Therapy Evaluation Patient Details Name: Belinda Ramirez MRN: [id number] DOB: 10-22-1975 Today's Date: 05/03/2015    History of Present Illness pt presents with HTN, Basal Ganglia ICH with Intraventricular Extension.  pt with hx of HTN and Sickle Cell.   Clinical Impression   PTA, pt was independent with ADLs and mobility. Pt currently presents with generalized weakness, balance deficits, and cognitive impairments including decreased attention, short-term memory, and problem solving skills. Pt required min guard for LB ADLs and mobility. Recommend SNF for post-acute rehab stay to maximize independence and safety due to pt's lack of support and current deficits. Will continue to follow acutely.    Follow Up Recommendations  SNF;Supervision/Assistance - 24 hour    Equipment Recommendations  None recommended by OT    Recommendations for Other Services       Precautions / Restrictions Precautions Precautions: Fall Restrictions Weight Bearing Restrictions: No      Mobility Bed Mobility Overal bed mobility: Modified Independent             General bed mobility comments: HOB elevated, use of bedrails  Transfers Overall transfer level: Needs assistance Equipment used: None Transfers: Sit to/from Stand Sit to Stand: Min guard         General transfer comment: Min guard for safety. No physical assist required. No LOB observed    Balance Overall balance assessment: Needs assistance Sitting-balance support: No upper extremity supported;Feet supported Sitting balance-Leahy Scale: Good     Standing balance support: No upper extremity supported;During functional activity Standing balance-Leahy Scale: Good Standing balance comment: Able to ambulate without UE support                            ADL Overall ADL's : Needs assistance/impaired Eating/Feeding: Modified independent;Sitting       Upper Body Bathing: Set up;Sitting   Lower Body  Bathing: Min guard;Sit to/from stand   Upper Body Dressing : Min guard;Sitting   Lower Body Dressing: Min guard;Sit to/from stand   Toilet Transfer: Min guard;Ambulation;Regular Museum/gallery exhibitions officer and Hygiene: Min guard;Sit to/from stand       Functional mobility during ADLs: Min guard General ADL Comments: Pt moves very, very slowly.  Is very slow to initiate activity.  Difficulty problem soliving through solutions when she makes an error or experiences difficulty      Vision Vision Assessment?: Yes Eye Alignment: Within Functional Limits Ocular Range of Motion: Within Functional Limits Alignment/Gaze Preference: Within Defined Limits Tracking/Visual Pursuits: Able to track stimulus in all quads without difficulty Saccades: Within functional limits Convergence: Within functional limits Visual Fields: No apparent deficits   Agricultural engineer Tested?: Yes Perception Deficits: Inattention/neglect Inattention/Neglect: Appears intact   Praxis Praxis Praxis tested?: Deficits Deficits: Initiation    Pertinent Vitals/Pain Pain Assessment: 0-10 Pain Score: 6  Pain Location: headache Pain Descriptors / Indicators: Headache Pain Intervention(s): Limited activity within patient's tolerance;Monitored during session;Repositioned;Premedicated before session     Hand Dominance Right   Extremity/Trunk Assessment Upper Extremity Assessment Upper Extremity Assessment: Generalized weakness   Lower Extremity Assessment Lower Extremity Assessment: Generalized weakness   Cervical / Trunk Assessment Cervical / Trunk Assessment: Normal   Communication Communication Communication: No difficulties (Limited verbalizations and quiet voice)   Cognition Arousal/Alertness: Awake/alert Behavior During Therapy: Flat affect Overall Cognitive Status: Impaired/Different from baseline Area of Impairment: Orientation;Memory;Following  commands;Safety/judgement;Awareness;Problem solving Orientation Level: Disoriented to;Situation Current Attention Level: Sustained (with cues - easily distracted )  Memory: Decreased short-term memory Following Commands: Follows one step commands consistently;Follows one step commands with increased time Safety/Judgement: Decreased awareness of safety;Decreased awareness of deficits Awareness: Emergent Problem Solving: Slow processing;Decreased initiation;Difficulty sequencing;Requires verbal cues General Comments: Pt continues to be slow to initiate activity and requires mod verbal cues to maintain attention to task at hand.   General Comments       Exercises       Shoulder Instructions      Home Living Family/patient expects to be discharged to:: Private residence Living Arrangements: Alone Available Help at Discharge: Family;Available PRN/intermittently Type of Home: House Home Access: Stairs to enter [employer] of Steps: "Several" Entrance Stairs-Rails: Right Home Layout: One level     Bathroom Shower/Tub: Tub/shower unit;Curtain Shower/tub characteristics: Architectural technologist: Handicapped height Bathroom Accessibility: Yes   Home Equipment: Shower seat;Grab bars - toilet;Grab bars - tub/shower;Hand held shower head   Additional Comments: Moved into a house where an older couple lived - so equipment is from them  Lives With: Alone    Prior Functioning/Environment Level of Independence: Independent        Comments: 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[employer], a Copywriter, advertising    OT Diagnosis: Generalized weakness;Acute pain   OT Problem List: Decreased strength;Decreased activity tolerance;Impaired balance (sitting and/or standing);Decreased range of motion;Decreased coordination;Decreased safety awareness;Decreased knowledge of use of DME or AE;Decreased knowledge of precautions;Pain   OT Treatment/Interventions: Self-care/ADL training;Therapeutic  exercise;Energy conservation;DME and/or AE instruction;Therapeutic activities;Balance training;Patient/family education    OT Goals(Current goals can be found in the care plan section) Acute Rehab OT Goals Patient Stated Goal: Pt did not state  OT Goal Formulation: With patient Time For Goal Achievement: 05/16/15 Potential to Achieve Goals: Good ADL Goals Pt Will Perform Grooming: with modified independence;standing Pt Will Perform Upper Body Dressing: with modified independence;sitting Pt Will Perform Lower Body Dressing: with modified independence;sitting/lateral leans;sit to/from stand Pt Will Transfer to Toilet: with modified independence;ambulating;regular height toilet Pt Will Perform Toileting - Clothing Manipulation and hygiene: with supervision;sitting/lateral leans;sit to/from stand Pt Will Perform Tub/Shower Transfer: Tub transfer;ambulating;shower seat  OT Frequency: Min 2X/week   Barriers to D/C: Decreased caregiver support  lives alone with minimal support locally       Co-evaluation              End of Session Equipment Utilized During Treatment: Gait belt Nurse Communication: Mobility status  Activity Tolerance: Patient tolerated treatment well Patient left: in bed;with call bell/phone within reach   Time: 1659-1718 OT Time Calculation (min): 19 min Charges:  OT General Charges $OT Visit: 1 Procedure OT Evaluation $OT Eval Moderate Complexity: 1 Procedure G-Codes:    Redmond Baseman, OTR/L PagerUD:[phone] 05/03/2015, 5:44 PM

## 2015-05-04 LAB — CBC
HCT: 37.8 % (ref 36.0–46.0)
Hemoglobin: 12.1 g/dL (ref 12.0–15.0)
MCH: 22.3 pg — AB (ref 26.0–34.0)
MCHC: 32 g/dL (ref 30.0–36.0)
MCV: 69.6 fL — ABNORMAL LOW (ref 78.0–100.0)
PLATELETS: 355 10*3/uL (ref 150–400)
RBC: 5.43 MIL/uL — AB (ref 3.87–5.11)
RDW: 18.3 % — ABNORMAL HIGH (ref 11.5–15.5)
WBC: 10.2 10*3/uL (ref 4.0–10.5)

## 2015-05-04 LAB — BASIC METABOLIC PANEL
Anion gap: 9 (ref 5–15)
BUN: 8 mg/dL (ref 6–20)
CALCIUM: 9.3 mg/dL (ref 8.9–10.3)
CO2: 24 mmol/L (ref 22–32)
CREATININE: 1.11 mg/dL — AB (ref 0.44–1.00)
Chloride: 106 mmol/L (ref 101–111)
GFR calc non Af Amer: >60 mL/min (ref >60)
Glucose, Bld: 105 mg/dL — ABNORMAL HIGH (ref 65–99)
Potassium: 3.4 mmol/L — ABNORMAL LOW (ref 3.5–5.1)
SODIUM: 139 mmol/L (ref 135–145)

## 2015-05-04 LAB — METANEPHRINES, PLASMA
Metanephrine, Free: 47 pg/mL (ref 0–62)
Normetanephrine, Free: 116 pg/mL (ref 0–145)

## 2015-05-04 MED ORDER — SODIUM CHLORIDE 0.9 % IV SOLN: INTRAVENOUS | Status: AC

## 2015-05-04 MED ORDER — HYDRALAZINE HCL 10 MG PO TABS
10.0000 mg | ORAL_TABLET | Freq: Four times a day (QID) | ORAL | Status: DC
  Administered 2015-05-04 – 2015-05-06 (×8): 10 mg via ORAL
  Filled 2015-05-04 (×8): qty 1

## 2015-05-04 MED ORDER — ADULT MULTIVITAMIN W/MINERALS CH
1.0000 | ORAL_TABLET | Freq: Every day | ORAL | Status: DC
  Administered 2015-05-04 – 2015-05-06 (×3): 1 via ORAL
  Filled 2015-05-04 (×3): qty 1

## 2015-05-04 MED ORDER — HYDRALAZINE HCL 20 MG/ML IJ SOLN
10.0000 mg | INTRAMUSCULAR | Status: DC | PRN
  Administered 2015-05-04 – 2015-05-06 (×3): 10 mg via INTRAVENOUS
  Filled 2015-05-04 (×3): qty 1

## 2015-05-04 MED ORDER — CLONIDINE HCL 0.1 MG PO TABS
0.2000 mg | ORAL_TABLET | Freq: Three times a day (TID) | ORAL | Status: DC
  Administered 2015-05-04 – 2015-05-06 (×6): 0.2 mg via ORAL
  Filled 2015-05-04 (×6): qty 2

## 2015-05-04 MED ORDER — POTASSIUM CHLORIDE CRYS ER 20 MEQ PO TBCR
20.0000 meq | EXTENDED_RELEASE_TABLET | Freq: Two times a day (BID) | ORAL | Status: AC
  Administered 2015-05-04 – 2015-05-06 (×4): 20 meq via ORAL
  Filled 2015-05-04 (×4): qty 1

## 2015-05-04 MED ORDER — ALBUTEROL SULFATE (2.5 MG/3ML) 0.083% IN NEBU: 3.0000 mL | INHALATION_SOLUTION | Freq: Four times a day (QID) | RESPIRATORY_TRACT | Status: DC | PRN

## 2015-05-04 MED ORDER — CLONIDINE HCL 0.1 MG PO TABS
0.1000 mg | ORAL_TABLET | Freq: Three times a day (TID) | ORAL | Status: DC
  Administered 2015-05-04 (×2): 0.1 mg via ORAL
  Filled 2015-05-04 (×2): qty 1

## 2015-05-04 NOTE — Clinical Social Work Note (Signed)
Clinical Social Work Assessment  Patient Details  Name: Belinda Ramirez MRN: [id number] Date of Birth: 01-03-1976  Date of referral:  05/04/15               Reason for consult:  Facility Placement                Permission sought to share information with:  Facility Art therapist granted to share information::  Yes, Verbal Permission Granted  Name::        Agency::     Relationship::     Contact Information:     Housing/Transportation Living arrangements for the past 2 months:  Single Family Home Source of Information:  Patient Patient Interpreter Needed:  None Criminal Activity/Legal Involvement Pertinent to Current Situation/Hospitalization:  No - Comment as needed Significant Relationships:    Lives with:  Friends Do you feel safe going back to the place where you live?  No Need for family participation in patient care:  No (Coment)  Care giving concerns:  Pt lives alone and and has no one who can provide 24 hour care to her at d/c.  She agrees to SNF placement if that is what is being recommended for her at d/c.  Social Worker assessment / plan: CSW met with pt to discuss role of CSW/discharge planning.  Pt was lethargic and unable to hold her eyes open during interview, but she did provide permission for a bed search.  Pt prefers SNF in [place] but we will need to look elsewhere as pt will need LOG bed.  CSW will begin bed search process and facilitate d/c plan, as appropriate.  Employment status:  Systems developer information:  Self Pay (Medicaid Pending) PT Recommendations:  [place] / Referral to community resources:     Patient/Family's Response to care:  Agreeable to SNF placement at d/c to increase her independence and prepare her for returning home alone. Patient/Family's Understanding of and Emotional Response to Diagnosis, Current Treatment, and Prognosis:  Not assessed at this time d/t pt's lethargy. Emotional  Assessment Appearance:  Appears stated age Attitude/Demeanor/Rapport:  Lethargic Affect (typically observed):  Flat Orientation:  Oriented to Self, Oriented to Place, Oriented to Situation, Oriented to  Time Alcohol / Substance use:  Not Applicable Psych involvement (Current and /or in the community):     Discharge Needs  Concerns to be addressed:  Discharge Planning Concerns Readmission within the last 30 days:  No Current discharge risk:  Lives alone, Physical Impairment, Lack of support system Barriers to Discharge:  Inadequate or no insurance   Caldonia Leap M, LCSW 05/04/2015, 3:10 PM

## 2015-05-04 NOTE — NC FL2 (Signed)
[place] LEVEL OF CARE SCREENING TOOL     IDENTIFICATION  Patient Name: Belinda Ramirez Birthdate: 1975-02-19 Sex: female Admission Date (Current Location): 04/30/2015  [facility] and [place] Number:  Herbalist and Address:  [facility]. [facility], [place] [address] [place], [place] [address]      Provider Number: [id number]  Attending Physician Name and Address:  Rosalin Hawking, MD  Relative Name and Phone Number:       Current Level of Care: Hospital Recommended Level of Care: [place] Prior Approval Number:    Date Approved/Denied:   PASRR Number:    Discharge Plan: SNF    Current Diagnoses: Patient Active Problem List   Diagnosis Date Noted  . HTN (hypertension), malignant   . HLD (hyperlipidemia)   . Morbid obesity due to excess calories ([place])   . Intraparenchymal hematoma of brain ([place]) 04/30/2015  . ICH (intracerebral hemorrhage) ([place]) 04/30/2015  . Hypertensive emergency   . Hyperlipidemia   . Obesity   . AKI (acute kidney injury) ([place])   . Hypokalemia     Orientation RESPIRATION BLADDER Height & Weight     Self, Time, Situation, Place  Normal Continent Weight: 275 lb 14.4 oz (125.147 kg) Height:  5\' 4"  (162.6 cm)  BEHAVIORAL SYMPTOMS/MOOD NEUROLOGICAL BOWEL NUTRITION STATUS      Continent Diet (NPO for now)  AMBULATORY STATUS COMMUNICATION OF NEEDS Skin   Limited Assist Verbally Normal                       Personal Care Assistance Level of Assistance  Bathing, Dressing Bathing Assistance: Limited assistance   Dressing Assistance: Limited assistance     Functional Limitations Info  Sight, Hearing, Speech Sight Info: Adequate Hearing Info: Adequate Speech Info: Adequate    SPECIAL CARE FACTORS FREQUENCY  PT (By licensed PT), OT (By licensed OT)     PT Frequency:  (5x/week) OT Frequency: 5x/week            Contractures Contractures Info: Not present    Additional Factors Info   Allergies   Allergies Info:  (latex percocent oxycodone)           Current Medications (05/04/2015):  This is the current hospital active medication list Current Facility-Administered Medications  Medication Dose Route Frequency Provider Last Rate Last Dose  .  stroke: mapping our early stages of recovery book   Does not apply Once Ram Fuller Mandril, MD      . 0.9 %  sodium chloride infusion   Intravenous STAT Jenifer Algernon Huxley, MD      . acetaminophen (TYLENOL) tablet 650 mg  650 mg Oral Q4H PRN Juanito Doom, MD      . albuterol (PROVENTIL) (2.5 MG/3ML) 0.083% nebulizer solution 3 mL  3 mL Inhalation Q6H PRN Rosalin Hawking, MD      . amLODipine (NORVASC) tablet 10 mg  10 mg Oral Daily Collene Gobble, MD   10 mg at 05/04/15 1041  . antiseptic oral rinse (CPC / CETYLPYRIDINIUM CHLORIDE 0.05%) solution 7 mL  7 mL Mouth Rinse BID Rosalin Hawking, MD   7 mL at 05/04/15 1126  . cloNIDine (CATAPRES) tablet 0.1 mg  0.1 mg Oral TID Shanon Brow L Rinehuls, PA-C   0.1 mg at 05/04/15 1521  . heparin injection 5,000 Units  5,000 Units Subcutaneous 3 times per day Rosalin Hawking, MD   5,000 Units at 05/04/15 1316  .  hydrALAZINE (APRESOLINE) injection 10 mg  10 mg Intravenous Q4H PRN David L Rinehuls, PA-C   10 mg at 05/04/15 1126  . labetalol (NORMODYNE,TRANDATE) injection 20 mg  20 mg Intravenous Q2H PRN Rosalin Hawking, MD   20 mg at 05/04/15 1316  . lisinopril (PRINIVIL,ZESTRIL) tablet 20 mg  20 mg Oral BID Rosalin Hawking, MD   20 mg at 05/04/15 1041  . multivitamin with minerals tablet 1 tablet  1 tablet Oral Daily Rosalin Hawking, MD   1 tablet at 05/04/15 1041  . pantoprazole (PROTONIX) EC tablet 40 mg  40 mg Oral Daily Rosalin Hawking, MD   40 mg at 05/04/15 1041  . senna-docusate (Senokot-S) tablet 1 tablet  1 tablet Oral BID Ram Fuller Mandril, MD   1 tablet at 05/04/15 1041  . traMADol (ULTRAM) tablet 50 mg  50 mg Oral Q6H PRN Juanito Doom, MD   50 mg at 05/04/15 1047     Discharge  Medications: Please see discharge summary for a list of discharge medications.  Relevant Imaging Results:  Relevant Lab Results:   Additional Information    Meagan Ancona M, LCSW

## 2015-05-04 NOTE — Progress Notes (Signed)
MD made aware of BP. See new orders

## 2015-05-04 NOTE — Progress Notes (Signed)
STROKE TEAM PROGRESS NOTE  History At Time of Admission Belinda Ramirez is an 40 y.o. female patient who presented to the emergency room, with severe headache, onset was around noon when she is at work. 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 She apparently lifted some heavy boxes around that time following which she noticed severe onset of headache which gradually worsened. She presented to the ER along with a coworker. She also reported having some left upper and lower extremity mild weakness starting around this time. CT of the head done in the ER showed right basal ganglia intracerebral hemorrhage with intraventricular extension of the hemorrhage into the lateral third and fourth ventricles. Patient was given fentanyl to help with the headache which appears to be easing her headache. She is slightly sedated with fentanyl during my evaluation. She denies any vision or speech problems no symptoms involving the right upper and lower extremity.  She has a known hypertension history and is on hydrochlorothiazide 25 mg daily at home, reported good medication compliance in general except that she forgot to take her medication this morning. She is also on aspirin 325 mg daily and denies any anticoagulant use. While she is in the ER, her blood pressure was severely elevated initially up to systolic [id number]. She is maxed out on nicardipine drip at 15 mg and her blood pressure still continue to be in 180s when I initially saw her.  Date last known well: 04/30/2015 Time last known well: 1200 tPA Given: No: ICH     SUBJECTIVE (INTERVAL HISTORY) Overall she feels her condition is stable. Blood pressure still elevated despite being on amlodipine and lisinopril. Will increase lisinopril and add a b-blocker. Still complaining of headache. PT recommended SNF on 3/17, awaiting repeat PT evaluation today. She is ambulating well. Friend is on the phone, no one at bedside. Per nurse was given labetalol prn for  BP. Will adjust PO meds   OBJECTIVE Temp:  [97.3 F (36.3 C)-99.7 F (37.6 C)] 99.7 F (37.6 C) (03/19 1655) Pulse Rate:  [51-72] 71 (03/19 1655) Cardiac Rhythm:  [-] Normal sinus rhythm;Heart block (03/19 0700) Resp:  [18-20] 18 (03/19 1655) BP: (162-214)/(67-111) 189/94 mmHg (03/19 1655) SpO2:  [94 %-100 %] 94 % (03/19 1655)  CBC:   Recent Labs Lab 05/03/15 0614 05/04/15 0503  WBC 8.2 10.2  HGB 11.6* 12.1  HCT 36.6 37.8  MCV 70.7* 69.6*  PLT 345 [id number]    Basic Metabolic Panel:   Recent Labs Lab 05/03/15 0614 05/04/15 0503  NA 143 139  K 3.6 3.4*  CL 107 106  CO2 23 24  GLUCOSE 91 105*  BUN 10 8  CREATININE 1.18* 1.11*  CALCIUM 9.1 9.3    Lipid Panel:     Component Value Date/Time   CHOL 187 05/01/2015 1131   TRIG 80 05/01/2015 1131   HDL 63 05/01/2015 1131   CHOLHDL 3.0 05/01/2015 1131   VLDL 16 05/01/2015 1131   LDLCALC 108* 05/01/2015 1131   HgbA1c:  Lab Results  Component Value Date   HGBA1C 6.2* 05/01/2015   Urine Drug Screen:     Component Value Date/Time   LABOPIA NONE DETECTED 05/01/2015 0700   COCAINSCRNUR NONE DETECTED 05/01/2015 0700   LABBENZ NONE DETECTED 05/01/2015 0700   AMPHETMU NONE DETECTED 05/01/2015 0700   THCU NONE DETECTED 05/01/2015 0700   LABBARB NONE DETECTED 05/01/2015 0700      IMAGING I have personally reviewed the radiological images below and agree with the  radiology interpretations.  Ct Angio Head W/cm &/or Wo Cm 05/01/2015   No medium or large size intracranial arterial vessel significant stenosis or occlusion. No aneurysm or vascular malformation. Exam not optimized to evaluate venous structures. Minimal change in size of hematoma centered in the right caudate with breakthrough of hemorrhage into the lateral ventricles once again noted. When compared to prior examination, slight increase in degree of midline shift to the left and increase amount of dependent hemorrhage within the left lateral ventricle as  detailed above. It is possible hemorrhage is related to hypertensive hemorrhage. Recommend evaluating this region on follow-up as hemorrhage clears to exclude underlying lesion. Degree of hydrocephalus similar to yesterday's exam. Exophthalmos. Prominence of the extra-ocular muscles. Question thyroid ophthalmopathy? 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    Ct Head Wo Contrast 04/30/2015    Acute right basal gangliar intraparenchymal hemorrhage with intraventricular extension, involving right lateral ventricle, third ventricle, frontal horn of the left lateral ventricle, as well as the aqua duct and fourth ventricle. Evidence of early hydrocephalus. 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    TTE - - LVEF 60-65%, severely increased LV wall thickness (>1.6 cm),  normal wall motion, diastolic dysfunction with indeterminate LV  filling pressure, mildly dilated RA, normal IVC.   Renal artery duplex - Preliminary findings: Technically difficult study due to body habitus and poor patient postition. No obvious renal artery stenosis identified bilaterally.  Repeat CT head 05/03/2015  Right anterior basal ganglia with hemorrhage is stable. Intraventricular hemorrhage slightly improved.  Mild hydrocephalus and midline shift stable from the prior study. No new hemorrhage since yesterday.     PHYSICAL EXAM  Temp:  [97.3 F (36.3 C)-99.7 F (37.6 C)] 99.7 F (37.6 C) (03/19 1655) Pulse Rate:  [51-72] 71 (03/19 1655) Resp:  [18-20] 18 (03/19 1655) BP: (162-214)/(67-111) 189/94 mmHg (03/19 1655) SpO2:  [94 %-100 %] 94 % (03/19 1655)  General - Well nourished, well developed, not in acute distress.  Ophthalmologic - Fundi not visualized due to noncooperation.  Cardiovascular - Regular rate and rhythm with no murmur.  Mental Status -  Level of arousal and orientation to time, age, place,  and person were intact. Language including expression, naming, repetition, comprehension was assessed and found intact. Fund of Knowledge was assessed and was intact.  Cranial Nerves II - XII - II - Visual field intact OU. III, IV, VI - Extraocular movements intact. V - Facial sensation intact bilaterally. VII - Facial movement intact bilaterally. VIII - Hearing & vestibular intact bilaterally. X - Palate elevates symmetrically. XI - Chin turning & shoulder shrug intact bilaterally. XII - Tongue protrusion intact.  Motor Strength - The patient's strength was LUE and LLE 5-/5 and RUE and RLE 5/5, pronator drift was absent.  Bulk was normal and fasciculations were absent.   Motor Tone - Muscle tone was assessed at the neck and appendages and was normal.  Reflexes - The patient's reflexes were 1+ in all extremities and she had no pathological reflexes.  Sensory - Light touch, temperature/pinprick were assessed and were symmetrical.    Coordination - The patient had normal movements in the hands and feet with no ataxia or dysmetria, but slow in action.  Tremor was absent.  Gait and [place] out of bed unassisted, wide based, steady.   ASSESSMENT/PLAN Ms. Belinda Ramirez is a 40 y.o. female with history of HTN, HLD, sickle cell,  obesity presenting with HA and left UE and LE mild weakness. She did not receive IV t-PA due to [place].   Right BG ICH with IVH - likely hypertensive bleed  Resultant  mild LUE and LLE weakness, improving  CT head stable hematoma and no hydrocephalus  CTA head - no AVM or aneurysm seen  Head CT repeat - 05/03/2015 - stable (see above)  Carotid Doppler  Not ordered  2D Echo  EF 60-65%  LDL 108  HgbA1c 6.2  SCDs and subq heparin for VTE prophylaxis Diet NPO time specified  aspirin 325 mg daily prior to admission, now on No antithrombotic due to [place].  Ongoing aggressive stroke risk factor management  Therapy recommendations:  SNF  recommended  Disposition:  Pending  Hypertension  Uncontrolled at home  Off cleviprex drip now  On amlodipine, lisinopril, hydralazine, and Catapres ( PRN labetalol and hydralazine ) (May need to add diuretic)  BP - extremely elevated today - medications adjusted   Secondary HTN work up - renal A. Duplex negative so far, pending catecholamine, aldosterone, and renin.  Metanephrine - within normal range   Hyperlipidemia  Home meds: no statin at home  LDL 108, goal < 70  Hold off statin due to acute ICH, will initiate on discharge  Continue statin at discharge  ? AKI on CKD  Cre 1.91 -> 1.80 -> 1.70  -> 1.75  -> 1.18 improving  Encourage po input  Check BMP this pm  Other Stroke Risk Factors  Obesity, Body mass index is 47.33 kg/(m^2).   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  Headache due to BG bleed with vasogenic edema  Anemia  Mild hypokalemia - supplement  Hospital day # 27  40 year old female with acute right basal gangliar intraparenchymal hemorrhage with intraventricular extension, involving right lateral ventricle, third ventricle, frontal horn of the left lateral ventricle, as well as the aqua duct and fourth ventricle. Hypertensive bleed. Neurologically patient is stable but her blood pressure still continues to be elevated. Titrating BP medications.  Personally examined patient and images, and have participated in and made any corrections needed to history, physical, neuro exam,assessment and plan as stated above.  I have personally obtained the history, evaluated lab date, reviewed imaging studies and agree with radiology interpretations.    Sarina Ill, MD Stroke Neurology [phone] [facility]        To contact Stroke Continuity provider, please refer to [email or web address] After hours, contact General Neurology

## 2015-05-05 DIAGNOSIS — G936 Cerebral edema: Secondary | ICD-10-CM

## 2015-05-05 LAB — CATECHOLAMINES, FRACTIONATED, PLASMA
Dopamine: <30 pg/mL (ref 0–48)
EPINEPHRINE: 169 pg/mL — AB (ref 0–62)
NOREPINEPHRINE: 781 pg/mL (ref 0–874)

## 2015-05-05 NOTE — Progress Notes (Signed)
Physical Therapy Treatment Patient Details Name: Belinda Ramirez MRN: [id number] DOB: 1975-11-09 Today's Date: 05/05/2015    History of Present Illness pt presents with HTN, Basal Ganglia ICH with Intraventricular Extension.  pt with hx of HTN and Sickle Cell.    PT Comments    Pt reports she will have 24 hour supervision at home as mother is coming to stay with her at d/c. If this is accurate, feel pt will be safe enough to return home. If pt will not have 24 hour assist then continue to feel she will require SNF. Pt was able to complete basic mobility at a supervision level during session however required hands-on guarding for higher level balance activity. Will continue to follow and progress as able per POC.   Follow Up Recommendations  Home health PT;Supervision/Assistance - 24 hour     Equipment Recommendations  None recommended by PT    Recommendations for Other Services       Precautions / Restrictions Precautions Precautions: Fall Restrictions Weight Bearing Restrictions: No    Mobility  Bed Mobility Overal bed mobility: Modified Independent Bed Mobility: Rolling;Sidelying to Sit           General bed mobility comments: HOB slightly elevated but no use of rails required.   Transfers Overall transfer level: Needs assistance Equipment used: None Transfers: Sit to/from Stand Sit to Stand: Min guard;Supervision         General transfer comment: Close guard initially due to pt just waking up and being a little unsteady however progressed to a supervision level by end of session.   Ambulation/Gait Ambulation/Gait assistance: Min guard;Min assist Ambulation Distance (Feet): 400 Feet Assistive device: None Gait Pattern/deviations: Step-through pattern;Decreased stride length;Wide base of support Gait velocity: Decreased Gait velocity interpretation: Below normal speed for age/gender General Gait Details: Pt moving very slowly and guarded. Cued pt to improve  gait speed and/or step/stride length however she was unable to make corrective changes. With higher level balance activity required hands on guarding and min assist.    Stairs            Wheelchair Mobility    Modified Rankin (Stroke Patients Only) Modified Rankin (Stroke Patients Only) Pre-Morbid Rankin Score: No symptoms Modified Rankin: Moderately severe disability     Balance Overall balance assessment: Needs assistance Sitting-balance support: Feet supported;No upper extremity supported Sitting balance-Leahy Scale: Good     Standing balance support: No upper extremity supported;During functional activity Standing balance-Leahy Scale: Fair               High level balance activites: Side stepping;Braiding;Backward walking;Turns High Level Balance Comments: Min assist for attempts at braiding. Pt unable to complete full step over (R over L). Increased balance loss with back peddling.     Cognition Arousal/Alertness: Awake/alert Behavior During Therapy: Flat affect Overall Cognitive Status: Impaired/Different from baseline Area of Impairment: Orientation;Memory;Following commands;Safety/judgement;Awareness;Problem solving Orientation Level: Disoriented to;Situation Current Attention Level: Sustained (Easily distracted) Memory: Decreased short-term memory Following Commands: Follows one step commands consistently;Follows one step commands with increased time Safety/Judgement: Decreased awareness of safety;Decreased awareness of deficits Awareness: Emergent Problem Solving: Slow processing;Decreased initiation;Difficulty sequencing;Requires verbal cues General Comments: Pt continues to be slow to initiate activity and requires mod verbal cues to maintain attention to task at hand.    Exercises      General Comments        Pertinent Vitals/Pain Pain Assessment: Faces Faces Pain Scale: Hurts even more Pain Location: Headache Pain Descriptors / Indicators:  Headache;Operative site guarding Pain Intervention(s): Limited activity within patient's tolerance;Monitored during session;Repositioned    Home Living                      Prior Function            PT Goals (current goals can now be found in the care plan section) Acute Rehab PT Goals Patient Stated Goal: Pt did not state  PT Goal Formulation: With patient Time For Goal Achievement: 05/16/15 Potential to Achieve Goals: Good Progress towards PT goals: Progressing toward goals    Frequency  Min 4X/week    PT Plan Current plan remains appropriate    Co-evaluation             End of Session Equipment Utilized During Treatment: Gait belt Activity Tolerance: Patient tolerated treatment well Patient left: in chair;with call bell/phone within reach     Time: 0756-0825 PT Time Calculation (min) (ACUTE ONLY): 29 min  Charges:  $Gait Training: 23-37 mins                    G Codes:      Rolinda Roan 05/31/15, 8:41 AM   Rolinda Roan, PT, DPT Acute Rehabilitation Services Pager: [phone]

## 2015-05-05 NOTE — Progress Notes (Signed)
STROKE TEAM PROGRESS NOTE  History At Time of Admission Belinda Ramirez is an 40 y.o. female patient who presented to the emergency room, with severe headache, onset was around noon when she is at work. 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 She apparently lifted some heavy boxes around that time following which she noticed severe onset of headache which gradually worsened. She presented to the ER along with a coworker. She also reported having some left upper and lower extremity mild weakness starting around this time. CT of the head done in the ER showed right basal ganglia intracerebral hemorrhage with intraventricular extension of the hemorrhage into the lateral third and fourth ventricles. Patient was given fentanyl to help with the headache which appears to be easing her headache. She is slightly sedated with fentanyl during my evaluation. She denies any vision or speech problems no symptoms involving the right upper and lower extremity.  She has a known hypertension history and is on hydrochlorothiazide 25 mg daily at home, reported good medication compliance in general except that she forgot to take her medication this morning. She is also on aspirin 325 mg daily and denies any anticoagulant use. While she is in the ER, her blood pressure was severely elevated initially up to systolic [id number]. She is maxed out on nicardipine drip at 15 mg and her blood pressure still continue to be in 180s when I initially saw her.  Date last known well: 04/30/2015 Time last known well: 1200 tPA Given: No: ICH     SUBJECTIVE (INTERVAL HISTORY) Overall she feels her condition is stable. Blood pressure better controlled on amlodipine and lisinopril.   She is ambulating well. duaghter is on the phone, sister  at bedside.  I had a long discussion with the patient and family about permissive hypertension and explained that there is no need to achieve normotension prior to discharge OBJECTIVE Temp:  [98 F  (36.7 C)-99.8 F (37.7 C)] 98.3 F (36.8 C) (03/20 1852) Pulse Rate:  [60-73] 62 (03/20 1852) Cardiac Rhythm:  [-] Normal sinus rhythm (03/20 1900) Resp:  [18] 18 (03/20 1852) BP: (127-179)/(58-90) 142/67 mmHg (03/20 1852) SpO2:  [95 %-99 %] 97 % (03/20 1852)  CBC:   Recent Labs Lab 05/03/15 0614 05/04/15 0503  WBC 8.2 10.2  HGB 11.6* 12.1  HCT 36.6 37.8  MCV 70.7* 69.6*  PLT 345 [id number]    Basic Metabolic Panel:   Recent Labs Lab 05/03/15 0614 05/04/15 0503  NA 143 139  K 3.6 3.4*  CL 107 106  CO2 23 24  GLUCOSE 91 105*  BUN 10 8  CREATININE 1.18* 1.11*  CALCIUM 9.1 9.3    Lipid Panel:     Component Value Date/Time   CHOL 187 05/01/2015 1131   TRIG 80 05/01/2015 1131   HDL 63 05/01/2015 1131   CHOLHDL 3.0 05/01/2015 1131   VLDL 16 05/01/2015 1131   LDLCALC 108* 05/01/2015 1131   HgbA1c:  Lab Results  Component Value Date   HGBA1C 6.2* 05/01/2015   Urine Drug Screen:     Component Value Date/Time   LABOPIA NONE DETECTED 05/01/2015 0700   COCAINSCRNUR NONE DETECTED 05/01/2015 0700   LABBENZ NONE DETECTED 05/01/2015 0700   AMPHETMU NONE DETECTED 05/01/2015 0700   THCU NONE DETECTED 05/01/2015 0700   LABBARB NONE DETECTED 05/01/2015 0700      IMAGING I have personally reviewed the radiological images below and agree with the radiology interpretations.  Ct Angio Head W/cm &/or Wo  Cm 05/01/2015   No medium or large size intracranial arterial vessel significant stenosis or occlusion. No aneurysm or vascular malformation. Exam not optimized to evaluate venous structures. Minimal change in size of hematoma centered in the right caudate with breakthrough of hemorrhage into the lateral ventricles once again noted. When compared to prior examination, slight increase in degree of midline shift to the left and increase amount of dependent hemorrhage within the left lateral ventricle as detailed above. It is possible hemorrhage is related to hypertensive  hemorrhage. Recommend evaluating this region on follow-up as hemorrhage clears to exclude underlying lesion. Degree of hydrocephalus similar to yesterday's exam. Exophthalmos. Prominence of the extra-ocular muscles. Question thyroid ophthalmopathy? 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    Ct Head Wo Contrast 04/30/2015    Acute right basal gangliar intraparenchymal hemorrhage with intraventricular extension, involving right lateral ventricle, third ventricle, frontal horn of the left lateral ventricle, as well as the aqua duct and fourth ventricle. Evidence of early hydrocephalus. 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    TTE - - LVEF 60-65%, severely increased LV wall thickness (>1.6 cm),  normal wall motion, diastolic dysfunction with indeterminate LV  filling pressure, mildly dilated RA, normal IVC.   Renal artery duplex - Preliminary findings: Technically difficult study due to body habitus and poor patient postition. No obvious renal artery stenosis identified bilaterally.  Repeat CT head 05/03/2015  Right anterior basal ganglia with hemorrhage is stable. Intraventricular hemorrhage slightly improved.  Mild hydrocephalus and midline shift stable from the prior study. No new hemorrhage since yesterday.     PHYSICAL EXAM  Temp:  [98 F (36.7 C)-99.8 F (37.7 C)] 98.3 F (36.8 C) (03/20 1852) Pulse Rate:  [60-73] 62 (03/20 1852) Resp:  [18] 18 (03/20 1852) BP: (127-179)/(58-90) 142/67 mmHg (03/20 1852) SpO2:  [95 %-99 %] 97 % (03/20 1852)  General - Well nourished, well developed, not in acute distress.  Ophthalmologic - Fundi not visualized due to noncooperation.  Cardiovascular - Regular rate and rhythm with no murmur.  Mental Status -  Level of arousal and orientation to time, age, place, and person were intact. Language including expression, naming, repetition,  comprehension was assessed and found intact. Fund of Knowledge was assessed and was intact.  Cranial Nerves II - XII - II - Visual field intact OU. III, IV, VI - Extraocular movements intact. V - Facial sensation intact bilaterally. VII - Facial movement intact bilaterally. VIII - Hearing & vestibular intact bilaterally. X - Palate elevates symmetrically. XI - Chin turning & shoulder shrug intact bilaterally. XII - Tongue protrusion intact.  Motor Strength - The patient's strength was LUE and LLE 5-/5 and RUE and RLE 5/5, pronator drift was absent.  Bulk was normal and fasciculations were absent.   Motor Tone - Muscle tone was assessed at the neck and appendages and was normal.  Reflexes - The patient's reflexes were 1+ in all extremities and she had no pathological reflexes.  Sensory - Light touch, temperature/pinprick were assessed and were symmetrical.    Coordination - The patient had normal movements in the hands and feet with no ataxia or dysmetria, but slow in action.  Tremor was absent.  Gait and [place] out of bed unassisted, wide based, steady.   ASSESSMENT/PLAN Ms. Belinda Ramirez is a 40 y.o. female with history of HTN, HLD, sickle cell, obesity presenting with HA and left UE and LE  mild weakness. She did not receive IV t-PA due to [place].   Right BG ICH with IVH - likely hypertensive bleed  Resultant  mild LUE and LLE weakness, improving  CT head stable hematoma and no hydrocephalus  CTA head - no AVM or aneurysm seen  Head CT repeat - 05/03/2015 - stable (see above)  Carotid Doppler  Not ordered  2D Echo  EF 60-65%  LDL 108  HgbA1c 6.2  SCDs and subq heparin for VTE prophylaxis Diet NPO time specified  aspirin 325 mg daily prior to admission, now on No antithrombotic due to [place].  Ongoing aggressive stroke risk factor management  Therapy recommendations:  SNF recommended  Disposition:  Pending  Hypertension  Uncontrolled at home  Off  cleviprex drip now  On amlodipine, lisinopril, hydralazine, and Catapres ( PRN labetalol and hydralazine ) (May need to add diuretic)  BP - extremely elevated today - medications adjusted   Secondary HTN work up - renal A. Duplex negative so far, pending catecholamine, aldosterone, and renin.  Metanephrine - within normal range   Hyperlipidemia  Home meds: no statin at home  LDL 108, goal < 70  Hold off statin due to acute ICH, will initiate on discharge  Continue statin at discharge  ? AKI on CKD  Cre 1.91 -> 1.80 -> 1.70  -> 1.75  -> 1.18 improving  Encourage po input  Check BMP this pm  Other Stroke Risk Factors  Obesity, Body mass index is 47.33 kg/(m^2).   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  Headache due to BG bleed with vasogenic edema  Anemia  Mild hypokalemia - supplement  Hospital day # 42  40 year old female with acute right basal gangliar intraparenchymal hemorrhage with intraventricular extension, involving right lateral ventricle, third ventricle, frontal horn of the left lateral ventricle, as well as the aquaeduct and fourth ventricle. Hypertensive bleed. Neurologically patient is stable but her blood pressure still continues to be elevated. Titrating BP medications. Possible discharge tomorrow  Personally examined patient and images, and have participated in and made any corrections needed to history, physical, neuro exam,assessment and plan as stated above.  I have personally obtained the history, evaluated lab date, reviewed imaging studies and agree with radiology interpretations.    Antony Contras, MD Stroke Neurology [phone] [facility]        To contact Stroke Continuity provider, please refer to [email or web address] After hours, contact General Neurology

## 2015-05-05 NOTE — Clinical Social Work Note (Signed)
PT recommendation has changed to [facility] due to physical improvements. Patient ambulating 41ft with minimal assistance.  Patient does not require SNF placement.   Clinical Social Worker will sign off for now as social work intervention is no longer needed. Please consult [name] again if new need arises.  Glendon Axe, MSW, LCSWA [phone] 05/05/2015 6:22 PM

## 2015-05-06 DIAGNOSIS — G936 Cerebral edema: Secondary | ICD-10-CM

## 2015-05-06 MED ORDER — CLONIDINE HCL 0.2 MG PO TABS: 0.2000 mg | ORAL_TABLET | Freq: Three times a day (TID) | ORAL | Status: DC

## 2015-05-06 MED ORDER — ATORVASTATIN CALCIUM 40 MG PO TABS
40.0000 mg | ORAL_TABLET | Freq: Every day | ORAL | Status: DC
  Administered 2015-05-06: 40 mg via ORAL
  Filled 2015-05-06: qty 1

## 2015-05-06 MED ORDER — ATORVASTATIN CALCIUM 40 MG PO TABS: 40.0000 mg | ORAL_TABLET | Freq: Every day | ORAL | Status: DC

## 2015-05-06 MED ORDER — HYDRALAZINE HCL 10 MG PO TABS: 10.0000 mg | ORAL_TABLET | Freq: Four times a day (QID) | ORAL | Status: DC

## 2015-05-06 MED ORDER — POTASSIUM CHLORIDE CRYS ER 10 MEQ PO TBCR
30.0000 meq | EXTENDED_RELEASE_TABLET | Freq: Once | ORAL | Status: AC
  Administered 2015-05-06: 30 meq via ORAL
  Filled 2015-05-06: qty 1

## 2015-05-06 MED ORDER — LISINOPRIL 20 MG PO TABS: 20.0000 mg | ORAL_TABLET | Freq: Two times a day (BID) | ORAL | Status: DC

## 2015-05-06 MED ORDER — AMLODIPINE BESYLATE 10 MG PO TABS: 10.0000 mg | ORAL_TABLET | Freq: Every day | ORAL | Status: DC

## 2015-05-06 NOTE — Discharge Instructions (Signed)
1. Take blood pressure medications as instructed.  2. Gradually increase activity as tolerated. 3. Do not return to work until cleared by your physician. 4. Home physical therapy and nurse to check blood pressure. 5. See your primary care physician within one week for blood pressure check and blood work.

## 2015-05-06 NOTE — Progress Notes (Signed)
Physical Therapy Treatment Patient Details Name: Belinda Ramirez MRN: [id number] DOB: 06/18/1975 Today's Date: 05/06/2015    History of Present Illness pt presents with HTN, Basal Ganglia ICH with Intraventricular Extension.  pt with hx of HTN and Sickle Cell.    PT Comments    Patient's balance and ability to follow directions/perform cognitive tasks while walking is improving, however remains impaired. Continued slow processing and difficulty completing 2 step commands (functionally). Patient reports her brother is coming to stay with her (will be here tonight) and her mother will come on Friday.    Follow Up Recommendations  Home health PT;Supervision/Assistance - 24 hour (due to cognition)     Equipment Recommendations  None recommended by PT    Recommendations for Other Services       Precautions / Restrictions Precautions Precautions: Fall Restrictions Weight Bearing Restrictions: No    Mobility  Bed Mobility Overal bed mobility: Needs Assistance Bed Mobility: Rolling;Sidelying to Sit;Sit to Supine Rolling: Supervision Sidelying to sit: Supervision   Sit to supine: Supervision   General bed mobility comments: HoB flat, no rail; incr time and repeated cues  Transfers Overall transfer level: Needs assistance Equipment used: None Transfers: Sit to/from Stand Sit to Stand: Modified independent (Device/Increase time)         General transfer comment: x 4 reps with no unsteadiness noted  Ambulation/Gait Ambulation/Gait assistance: Supervision Ambulation Distance (Feet): 400 Feet Assistive device: None Gait Pattern/deviations: Step-through pattern;Wide base of support Gait velocity: Decreased, but able to vary speed (with incr processing time) Gait velocity interpretation: Below normal speed for age/gender General Gait Details: Slows down when listening and trying to follow instructions; no imbalance noted with head turns, pivot turns, scanning and trying to  find signs   Stairs Stairs: Yes Stairs assistance: Modified independent (Device/Increase time) Stair Management: One rail Left;Step to pattern;Forwards Number of Stairs: 5 General stair comments: reports she does step-to pattern due to previous LLE injury  Wheelchair Mobility    Modified Rankin (Stroke Patients Only) Modified Rankin (Stroke Patients Only) Pre-Morbid Rankin Score: No symptoms Modified Rankin: Moderately severe disability (supervision due to cognition)     Balance     Sitting balance-Leahy Scale: Good     Standing balance support: No upper extremity supported Standing balance-Leahy Scale: Good           Rhomberg - Eyes Opened: 30 (no imbalance) Rhomberg - Eyes Closed: 10 (normal sway and balance reactions)   High Level Balance Comments: alternating step-taps at step; stepup forwards, step down backwards x 5 (alternating legs and pattern);    Cognition Arousal/Alertness: Awake/alert Behavior During Therapy: Flat affect Overall Cognitive Status: Impaired/Different from baseline Area of Impairment: Memory;Following commands;Problem solving   Current Attention Level: Sustained (Difficulty attending related to slow processing) Memory: Decreased short-term memory Following Commands: Follows one step commands consistently;Follows one step commands with increased time     Problem Solving: Slow processing;Decreased initiation;Difficulty sequencing;Requires verbal cues General Comments: Pt continues to be slow to initiate activity and requires mod verbal cues to maintain attention to task at hand. She states she understands the words that are said, however it takes time for her to process and then complete task    Exercises      General Comments        Pertinent Vitals/Pain Pain Assessment: 0-10 Pain Score: 7  Pain Location: headache Pain Descriptors / Indicators: Aching;Pressure Pain Intervention(s): Limited activity within patient's  tolerance;Monitored during session;Patient requesting pain meds-RN notified;Other (comment) (monitored BP)  Home Living                      Prior Function            PT Goals (current goals can now be found in the care plan section) Acute Rehab PT Goals Patient Stated Goal: Pt did not state  Time For Goal Achievement: 05/16/15 Progress towards PT goals: Progressing toward goals    Frequency  Min 4X/week    PT Plan Current plan remains appropriate    Co-evaluation             End of Session Equipment Utilized During Treatment: Gait belt Activity Tolerance: Patient tolerated treatment well Patient left: with call bell/phone within reach;in bed;with nursing/sitter in room;with family/visitor present     Time: [id number] PT Time Calculation (min) (ACUTE ONLY): 48 min  Charges:  $Gait Training: 38-52 mins                    G Codes:      Belinda Ramirez 2015/05/10, 5:15 PM Pager [phone]

## 2015-05-06 NOTE — Progress Notes (Signed)
Patient taken down in a wheelchair. IV removed and D/C paperwork signed. No issues or concerns. Paxson Harrower, Rande Brunt, RN

## 2015-05-06 NOTE — Progress Notes (Signed)
Speech Language Pathology Treatment: Cognitive-Linguistic  Patient Details Name: Belinda Ramirez MRN: [id number] DOB: [birth year] Today's Date: 05/06/2015 Time: 1510-1540 SLP Time Calculation (min) (ACUTE ONLY): 30 min  Assessment / Plan / Recommendation Clinical Impression  The Montreal Cognitive Assessment (MoCA) was administered. Pt scored 19/30, indicating mild cognitive impairment. Pt has a high school education, and reports being employed and fully independent, living alone prior to admit. Difficulty noted on multiple subtests of the MoCA, including executive function, thought organization/verbal fluency, abstract reasoning, delayed recall, visuoperception and selective attention. Plan is for DC soon. Recommend consideration of home health or outpatient ST, given level of independence prior to admit. RN notified.   HPI HPI: 39 year old female admitted with ICH (CT head 03/15 > acute right basal ganglia IPH with IV extension, involving right lateral ventricle, 3rd ventricle, frontal horne of the left lateral ventricle, and aqua duct of the 4th ventricle) with intraventricular extension and concern for hydrocephalus.       SLP Plan  Continue with current plan of care     Recommendations   home health or outpatient ST             Follow up Recommendations: Outpatient SLP;Home health SLP Plan: Continue with current plan of care     Shonna Chock 05/06/2015, 3:46 PM  [name], [place] [phone]

## 2015-05-06 NOTE — Progress Notes (Signed)
OT Cancellation Note  Patient Details Name: Keishawn Gulden MRN: [id number] DOB: May 14, 1975   Cancelled Treatment:    Reason Eval/Treat Not Completed: Fatigue/lethargy limiting ability to participate;Pain limiting ability to participate.  Pt just finished with PT and reports significant headache - PT notifying RN   Darlina Rumpf [place], OTR/L [id number]  05/06/2015, 5:21 PM

## 2015-05-06 NOTE — Care Management Note (Signed)
Case Management Note  Patient Details  Name: Belinda Ramirez MRN: [id number] Date of Birth: 08/30/1975  Subjective/Objective:                    Action/Plan: CM met with the patient and she states she has support from her mom and she would like to go home at d/c. CM continuing to follow for d/c needs.   Expected Discharge Date:                  Expected Discharge Plan:     In-House Referral:     Discharge planning Services     Post Acute Care Choice:    Choice offered to:     DME Arranged:    DME Agency:     HH Arranged:    [place] Agency:     Status of Service:     Medicare Important Message Given:    Date Medicare IM Given:    Medicare IM give by:    Date Additional Medicare IM Given:    Additional Medicare Important Message give by:     If discussed at [place] of Stay Meetings, dates discussed:    Additional Comments:  Pollie Friar, RN 05/06/2015, 11:53 AM

## 2015-05-06 NOTE — Discharge Summary (Signed)
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 Active Problems:   Intraparenchymal hematoma of brain (HCC)   ICH (intracerebral hemorrhage) (HCC)   HTN (hypertension), malignant   HLD (hyperlipidemia)   Morbid obesity due to excess calories (HCC)   Cytotoxic brain edema (HCC)  BMI: Body mass index is 47.33 kg/(m^2).  Past Medical History  Diagnosis Date  . Hypertension   . High cholesterol   . Sickle cell trait ([place])   . Obesity    Past Surgical History  Procedure Laterality Date  . Tubal ligation        Medication List    STOP taking these medications        aspirin 325 MG tablet     hydrochlorothiazide 25 MG tablet  Commonly known as:  HYDRODIURIL     ibuprofen 600 MG tablet  Commonly known as:  ADVIL,MOTRIN      TAKE these medications        albuterol 108 (90 Base) MCG/ACT inhaler  Commonly known as:  PROVENTIL HFA;VENTOLIN HFA  Inhale 1-2 puffs into the lungs every 6 (six) hours as needed for wheezing or shortness of breath (or persistent coughing).     amLODipine 10 MG tablet  Commonly known as:  NORVASC  Take 1 tablet (10 mg total) by mouth daily.     atorvastatin 40 MG tablet  Commonly known as:  LIPITOR  Take 1 tablet (40 mg total) by mouth daily at 6 PM.     cloNIDine 0.2 MG tablet  Commonly known as:  CATAPRES  Take 1 tablet (0.2 mg total) by mouth 3 (three) times daily.     esomeprazole 20 MG capsule  Commonly known as:  NEXIUM  Take 20 mg by mouth daily before breakfast.     hydrALAZINE 10 MG tablet  Commonly known as:  APRESOLINE  Take 1 tablet (10 mg total)  by mouth every 6 (six) hours.     lisinopril 20 MG tablet  Commonly known as:  PRINIVIL,ZESTRIL  Take 1 tablet (20 mg total) by mouth 2 (two) times daily.     multivitamin with minerals Tabs tablet  Take 1 tablet by mouth daily.        LABORATORY STUDIES CBC    Component Value Date/Time   WBC 10.2 05/04/2015 0503   RBC 5.43* 05/04/2015 0503   HGB 12.1 05/04/2015 0503   HCT 37.8 05/04/2015 0503   PLT 355 05/04/2015 0503   MCV 69.6* 05/04/2015 0503   MCH 22.3* 05/04/2015 0503   MCHC 32.0 05/04/2015 0503   RDW 18.3* 05/04/2015 0503   CMP    Component Value Date/Time   NA 139 05/04/2015 0503   K 3.4* 05/04/2015 0503   CL 106 05/04/2015 0503   CO2 24 05/04/2015 0503   GLUCOSE 105* 05/04/2015 0503   BUN 8 05/04/2015 0503   CREATININE 1.11* 05/04/2015 0503   CALCIUM 9.3 05/04/2015 0503   PROT 8.1 04/30/2015 1325   ALBUMIN 3.5 04/30/2015 1325   AST 22 04/30/2015 1325   ALT 19 04/30/2015 1325   ALKPHOS 91 04/30/2015 1325   BILITOT 0.6 04/30/2015 1325  GFRNONAA >60 05/04/2015 0503   GFRAA >60 05/04/2015 0503   COAGS Lab Results  Component Value Date   INR 1.11 04/30/2015   Lipid Panel    Component Value Date/Time   CHOL 187 05/01/2015 1131   TRIG 80 05/01/2015 1131   HDL 63 05/01/2015 1131   CHOLHDL 3.0 05/01/2015 1131   VLDL 16 05/01/2015 1131   LDLCALC 108* 05/01/2015 1131   HgbA1C  Lab Results  Component Value Date   HGBA1C 6.2* 05/01/2015   Cardiac Panel (last 3 results) No results for input(s): CKTOTAL, CKMB, TROPONINI, RELINDX in the last 72 hours. Urinalysis    Component Value Date/Time   LABSPEC 1.015 05/01/2013 0834   PHURINE 7.5 05/01/2013 0834   GLUCOSEU NEGATIVE 05/01/2013 0834   HGBUR TRACE* 05/01/2013 0834   BILIRUBINUR NEGATIVE 05/01/2013 0834   KETONESUR NEGATIVE 05/01/2013 0834   PROTEINUR 100* 05/01/2013 0834   UROBILINOGEN 0.2 05/01/2013 0834   NITRITE NEGATIVE 05/01/2013 0834   LEUKOCYTESUR TRACE* 05/01/2013 0834   Urine Drug  Screen     Component Value Date/Time   LABOPIA NONE DETECTED 05/01/2015 0700   COCAINSCRNUR NONE DETECTED 05/01/2015 0700   LABBENZ NONE DETECTED 05/01/2015 0700   AMPHETMU NONE DETECTED 05/01/2015 0700   THCU NONE DETECTED 05/01/2015 0700   LABBARB NONE DETECTED 05/01/2015 0700    Alcohol Level No results found for: ETH   SIGNIFICANT DIAGNOSTIC STUDIES  Ct Angio Head W/cm &/or Wo Cm 05/01/2015  No medium or large size intracranial arterial vessel significant stenosis or occlusion. No aneurysm or vascular malformation. Exam not optimized to evaluate venous structures. Minimal change in size of hematoma centered in the right caudate with breakthrough of hemorrhage into the lateral ventricles once again noted. When compared to prior examination, slight increase in degree of midline shift to the left and increase amount of dependent hemorrhage within the left lateral ventricle as detailed above. It is possible hemorrhage is related to hypertensive hemorrhage. Recommend evaluating this region on follow-up as hemorrhage clears to exclude underlying lesion. Degree of hydrocephalus similar to yesterday's exam. Exophthalmos. Prominence of the extra-ocular muscles. Question thyroid ophthalmopathy? 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    Ct Head Wo Contrast 04/30/2015  Acute right basal gangliar intraparenchymal hemorrhage with intraventricular extension, involving right lateral ventricle, third ventricle, frontal horn of the left lateral ventricle, as well as the aqua duct and fourth ventricle. Evidence of early hydrocephalus. 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    TTE - - LVEF 60-65%, severely increased LV wall thickness (>1.6 cm),  normal wall motion, diastolic dysfunction with indeterminate LV  filling pressure, mildly dilated RA, normal IVC.   Renal artery duplex - Preliminary  findings: Technically difficult study due to body habitus and poor patient postition. No obvious renal artery stenosis identified bilaterally.  Repeat CT head 05/03/2015  Right anterior basal ganglia with hemorrhage is stable. Intraventricular hemorrhage slightly improved.  Mild hydrocephalus and midline shift stable from the prior study. No new hemorrhage since yesterday.    HISTORY OF PRESENT ILLNESS  Belinda Ramirez is an 40 y.o. female patient who presented to the emergency room, with severe headache, onset was around noon on the day of admission when she was at work. 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 She apparently lifted some heavy boxes around that time following which she noticed severe onset of headache which gradually worsened. She presented to  the ER along with a coworker. She also reported having some left upper and lower extremity mild weakness starting around this time. CT of the head done in the ER showed right basal ganglia intracerebral hemorrhage with intraventricular extension of the hemorrhage into the lateral third and fourth ventricles. Patient was given fentanyl to help with the headache which appeared to ease her headache. She was slightly sedated with fentanyl during her evaluation. She denied any vision or speech problems and no symptoms involving the right upper and lower extremity.  She has a known hypertension history and is on hydrochlorothiazide 25 mg daily at home, reported good medication compliance in general except that she forgot to take her medication that morning. She was also on aspirin 325 mg daily and denied any anticoagulant use. While she was in the ER, her blood pressure was severely elevated initially up to systolic [id number]. She is maxed out on nicardipine drip at 15 mg and her blood pressure still continue to be in the 180s when initially seen by neurology.  Date last known well: 04/30/2015 Time last known well: 1200 tPA Given: No:  [place] [facility] Ms. Belinda Ramirez is a 40 y.o. female with history of HTN, HLD, sickle cell, and obesity presenting with HA and left UE and LE mild weakness. She did not receive IV t-PA due to [place].   Right BG ICH with IVH - likely hypertensive bleed  Resultant mild LUE and LLE weakness, improving  CT head stable hematoma and no hydrocephalus  CTA head - no AVM or aneurysm seen  Head CT repeat - 05/03/2015 - stable (see above)  Carotid Doppler Not ordered  2D Echo EF 60-65%  LDL 108  HgbA1c 6.2  SCDs and subq heparin for VTE prophylaxis  Diet NPO time specified  aspirin 325 mg daily prior to admission, now on No antithrombotic due to [place].  Ongoing aggressive stroke risk factor management  Therapy recommendations: Home health physical therapy. Home nurse to monitor blood pressure.  Disposition: Discharged to home  Hypertension  Uncontrolled at home  Off cleviprex drip now  On amlodipine, lisinopril, hydralazine, and Catapres ( PRN labetalol and hydralazine ) (May need to add diuretic)  BP - extremely elevated today - medications adjusted  Secondary HTN work up - renal A. Duplex negative so far, pending catecholamine, aldosterone, and renin.  Metanephrine - within normal range   Hyperlipidemia  Home meds: no statin at home  LDL 108, goal < 70  Hold off statin due to acute ICH, will initiate on discharge - Lipitor 40 mg daily  Continue statin at discharge  ? AKI on CKD  Cre 1.91 -> 1.80 -> 1.70 -> 1.75 -> 1.18 improving  Encourage po input  Check BMP this pm  Other Stroke Risk Factors  Obesity, Body mass index is 47.33 kg/(m^2).  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  Headache due to BG bleed with vasogenic edema  Anemia  Mild hypokalemia - supplement  DISCHARGE EXAM Blood pressure 159/91, pulse 69, temperature 99.4 F (37.4 C), temperature source Oral, resp. rate 18,  height 5\' 4"  (1.626 m), weight 125.147 kg (275 lb 14.4 oz), SpO2 97 %.  General - Well nourished, well developed, not in acute distress.  Ophthalmologic - Fundi not visualized due to noncooperation.  Cardiovascular - Regular rate and rhythm with no murmur.  Mental Status -  Level of arousal and orientation to time, age, place, and person were intact. Language including  expression, naming, repetition, comprehension was assessed and found intact. Fund of Knowledge was assessed and was intact.  Cranial Nerves II - XII - II - Visual field intact OU. III, IV, VI - Extraocular movements intact. V - Facial sensation intact bilaterally. VII - Facial movement intact bilaterally. VIII - Hearing & vestibular intact bilaterally. X - Palate elevates symmetrically. XI - Chin turning & shoulder shrug intact bilaterally. XII - Tongue protrusion intact.  Motor Strength - The patient's strength was LUE and LLE 5-/5 and RUE and RLE 5/5, pronator drift was absent. Bulk was normal and fasciculations were absent.  Motor Tone - Muscle tone was assessed at the neck and appendages and was normal.  Reflexes - The patient's reflexes were 1+ in all extremities and she had no pathological reflexes.  Sensory - Light touch, temperature/pinprick were assessed and were symmetrical.   Coordination - The patient had normal movements in the hands and feet with no ataxia or dysmetria, but slow in action. Tremor was absent.  Gait and [place] out of bed unassisted, wide based, steady.  Discharge Diet   Diet regular Room service appropriate?: Yes; Fluid consistency:: Thin liquids  DISCHARGE PLAN  Disposition:  Discharged to home  No antithrombotic for secondary stroke prevention secondary to hemorrhage  Follow-up FULBRIGHT, VIRGINIA E, PA-C in 2 weeks.  Follow-up with Dr. [facility] in 2 months.  Discharge instructions to the patient 1. Take blood pressure medications as instructed.   2. Gradually increase activity as tolerated. 3. Do not return to work until cleared by your physician. 4. Home physical therapy and nurse to check blood pressure. 5. See your primary care physician within one week for blood pressure check and blood work (Bmet)   31 minutes were spent preparing discharge.  Belinda Bussing PA-C [facility] Pager [phone] 05/06/2015, 4:08 PM I have personally examined this patient, reviewed notes, independently viewed imaging studies, participated in medical decision making and plan of care. I have made any additions or clarifications directly to the above note. Agree with note above.   Antony Contras, MD Medical Director [facility] Pager: [phone] 05/06/2015 6:51 PM

## 2015-05-07 NOTE — Care Management Note (Signed)
Case Management Note  Patient Details  Name: Ceriyah Nearing MRN: [id number] Date of Birth: 1975/03/21  Subjective/Objective:                    Action/Plan: 05/07/2015 at 50: Patient left late yesterday with order for HHPT/RN. Patient without insurance. CM called [facility] this am and they are going to see if the patient qualifies for charity for [facility] services. CM will follow and inform the patient is she is able to have [facility] services.   Expected Discharge Date:                  Expected Discharge Plan:     In-House Referral:     Discharge planning Services     Post Acute Care Choice:    Choice offered to:     DME Arranged:    DME Agency:     HH Arranged:    [place] Agency:     Status of Service:     Medicare Important Message Given:    Date Medicare IM Given:    Medicare IM give by:    Date Additional Medicare IM Given:    Additional Medicare Important Message give by:     If discussed at [place] of Stay Meetings, dates discussed:    Additional Comments:  Pollie Friar, RN 05/07/2015, 9:15 AM

## 2015-05-08 LAB — ALDOSTERONE + RENIN ACTIVITY W/ RATIO
ALDO / PRA Ratio: 2.1 (ref 0.0–30.0)
ALDOSTERONE: 4.2 ng/dL (ref 0.0–30.0)
PRA LC/MS/MS: 2.011 ng/mL/hr (ref 0.167–5.380)

## 2015-05-08 NOTE — Progress Notes (Signed)
05/08/2015 @ 1425: CM received a call from Butch Penny with [facility] that patient does have insurance and was able to provide them with the name and policy number. CM called and left a message with Quita Skye in financial counseling with the patients insurance carrier [facility]) and her home number to contact the patient.

## 2015-06-19 ENCOUNTER — Encounter: Payer: Self-pay | Admitting: Neurology

## 2015-06-19 ENCOUNTER — Ambulatory Visit (INDEPENDENT_AMBULATORY_CARE_PROVIDER_SITE_OTHER): Payer: PRIVATE HEALTH INSURANCE | Admitting: Neurology

## 2015-06-19 VITALS — BP 170/107 | HR 80 | Ht 64.0 in | Wt 269.2 lb

## 2015-06-19 DIAGNOSIS — I161 Hypertensive emergency: Secondary | ICD-10-CM | POA: Diagnosis not present

## 2015-06-19 DIAGNOSIS — E785 Hyperlipidemia, unspecified: Secondary | ICD-10-CM

## 2015-06-19 DIAGNOSIS — G4733 Obstructive sleep apnea (adult) (pediatric): Secondary | ICD-10-CM

## 2015-06-19 DIAGNOSIS — I610 Nontraumatic intracerebral hemorrhage in hemisphere, subcortical: Secondary | ICD-10-CM | POA: Diagnosis not present

## 2015-06-19 MED ORDER — HYDRALAZINE HCL 50 MG PO TABS: 50.0000 mg | ORAL_TABLET | Freq: Three times a day (TID) | ORAL | Status: DC

## 2015-06-19 NOTE — Patient Instructions (Addendum)
-   increase hydralazine to 50mg  three times a day - continue clonidine 0.2mg  three times a day and lisinopril 20mg  twice a day. - check BP at home and record and bring over to Dr. 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 - continue lipitor for stroke prevention - will do carotid doppler to rule out carotid stenosis. - will do sleep study to evaluate sleep apnea - Follow up with your primary care physician for stroke risk factor modification. Recommend maintain blood pressure goal around 130/80, diabetes with hemoglobin A1c goal below 6.5% and lipids with LDL cholesterol goal below 70 mg/dL.  - healthy diet and regular exercise - follow up in 3 months

## 2015-06-20 ENCOUNTER — Telehealth: Payer: Self-pay | Admitting: Neurology

## 2015-06-20 NOTE — Telephone Encounter (Signed)
Patient is calling. She saw Dr. Erlinda Hong in the office yesterday and forgot to get a note for work. The note needs to say that the patient is able to lift up to 20 lbs only. Please call patient when ready to pick up at [phone] and she  would like to pick it up today.

## 2015-06-20 NOTE — Telephone Encounter (Signed)
Patient came to office to get letter for work. Pt can return to work full time, and lift no more than 20lbs. Pt will be evaluated by Dr. Erlinda Hong in August 2017. Dr. Erlinda Hong sign the letter.

## 2015-06-21 NOTE — Progress Notes (Signed)
STROKE NEUROLOGY FOLLOW UP NOTE  NAME: Belinda Ramirez DOB: 07/01/1975  REASON FOR VISIT: stroke follow up HISTORY FROM: pt and chart  Today we had the pleasure of seeing Belinda Ramirez in follow-up at our Neurology Clinic. Pt was accompanied by no one.   History Summary Belinda Ramirez is a 40 y.o. female with history of HTN, HLD, sickle cell, and obesity admitted on 04/30/15 for HA and left UE and LE mild weakness. CT head showed right BG hemorrhage. CTA head showed no AVM or aneurysm. Repeat head CT stable hematoma. TTE EF 60-65%, LDL 108 and A1C 6.2. Initially BP high, put on cleviprex, later transitioned to po BP meds. Renal artery ultrasound negative, malignant HTN labs also unremarkable. Pt was discharged in good condition to home with home PT/OT after stabilization.   Interval History During the interval time, the patient has been doing well. Symptoms resolved. Complains of easy fatigue. 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 BP still high today in clinic, 170/101. She stated similar BP at home. Currently on clonidine 0.2mg  tid, hydralazine 10mg  qid, and lisinopril 20mg  bid.   REVIEW OF SYSTEMS: Full 14 system review of systems performed and notable only for those listed below and in HPI above, all others are negative:  Constitutional:   Cardiovascular:  Ear/Nose/Throat:   Skin:  Eyes:   Respiratory:   Gastroitestinal:   Genitourinary:  Hematology/Lymphatic:   Endocrine:  Musculoskeletal:   Allergy/Immunology:   Neurological:   Psychiatric:  Sleep:   The following represents the patient's updated allergies and side effects list: Allergies  Allergen Reactions  . Latex Itching  . Oxycodone Hives  . Percocet [Oxycodone-Acetaminophen] Hives    The neurologically relevant items on the patient's problem list were reviewed on today's visit.  Neurologic Examination  A problem focused neurological exam (12 or more points of the single system neurologic examination,  vital signs counts as 1 point, cranial nerves count for 8 points) was performed.  Blood pressure 170/107, pulse 80, height 5\' 4"  (1.626 m), weight 269 lb 3.2 oz (122.108 kg).  General - morbid, obesity, well developed, in no apparent distress.  Ophthalmologic - Fundi not visualized due to small pupils.  Cardiovascular - Regular rate and rhythm.  Mental Status -  Level of arousal and orientation to time, place, and person were intact. Language including expression, naming, repetition, comprehension was assessed and found intact. Attention span and concentration were normal. Recent and remote memory were intact. Fund of Knowledge was assessed and was intact.  Cranial Nerves II - XII - II - Visual field intact OU III, IV, VI - Extraocular movements intact. V - Facial sensation intact bilaterally. VII - Facial movement intact bilaterally. VIII - Hearing & vestibular intact bilaterally. X - Palate elevates symmetrically. XI - Chin turning & shoulder shrug intact bilaterally. XII - Tongue protrusion intact  Motor Strength - The patient's strength was normal in all extremities and pronator drift was absent.  Bulk was normal and fasciculations were absent.   Motor Tone - Muscle tone was assessed at the neck and appendages and was normal.  Reflexes - The patient's reflexes were 1+ in all extremities and she had no pathological reflexes.  Sensory - Light touch, temperature/pinprick were assessed and were normal.    Coordination - The patient had normal movements in the hands and feet with no ataxia or dysmetria.  Tremor was absent.  Gait and Station - The patient's transfers, posture, gait, station, and turns were observed as  normal.   Functional score  mRS = 1   0 - No symptoms.   1 - No significant disability. Able to carry out all usual activities, despite some symptoms.   2 - Slight disability. Able to look after own affairs without assistance, but unable to carry out all  previous activities.   3 - Moderate disability. Requires some help, but able to walk unassisted.   4 - Moderately severe disability. Unable to attend to own bodily needs without assistance, and unable to walk unassisted.   5 - Severe disability. Requires constant nursing care and attention, bedridden, incontinent.   6 - Dead.   NIH Stroke Scale = 0  Data reviewed: I personally reviewed the images and agree with the radiology interpretations.  Ct Angio Head W/cm &/or Wo Cm 05/01/2015  No medium or large size intracranial arterial vessel significant stenosis or occlusion. No aneurysm or vascular malformation. Exam not optimized to evaluate venous structures. Minimal change in size of hematoma centered in the right caudate with breakthrough of hemorrhage into the lateral ventricles once again noted. When compared to prior examination, slight increase in degree of midline shift to the left and increase amount of dependent hemorrhage within the left lateral ventricle as detailed above. It is possible hemorrhage is related to hypertensive hemorrhage. Recommend evaluating this region on follow-up as hemorrhage clears to exclude underlying lesion. Degree of hydrocephalus similar to yesterday's exam. Exophthalmos. Prominence of the extra-ocular muscles. Question thyroid ophthalmopathy? 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   Ct Head Wo Contrast 04/30/2015  Acute right basal gangliar intraparenchymal hemorrhage with intraventricular extension, involving right lateral ventricle, third ventricle, frontal horn of the left lateral ventricle, as well as the aqua duct and fourth ventricle. Evidence of early hydrocephalus. 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   TTE - - LVEF 60-65%, severely increased LV wall thickness (>1.6 cm),  normal wall motion, diastolic dysfunction with indeterminate LV   filling pressure, mildly dilated RA, normal IVC.  Renal artery duplex - Preliminary findings: Technically difficult study due to body habitus and poor patient postition. No obvious renal artery stenosis identified bilaterally.  Repeat CT head 05/03/2015  Right anterior basal ganglia with hemorrhage is stable. Intraventricular hemorrhage slightly improved.  Mild hydrocephalus and midline shift stable from the prior study. No new hemorrhage since yesterday.  Component     Latest Ref Rng 05/01/2015  Cholesterol     0 - 200 mg/dL 187  Triglycerides     <150 mg/dL 80  HDL Cholesterol     >40 mg/dL 63  Total CHOL/HDL Ratio      3.0  VLDL     0 - 40 mg/dL 16  LDL (calc)     0 - 99 mg/dL 108 (H)  PRA LC/MS/MS     0.167 - 5.380 ng/mL/hr 2.011  ALDO / PRA Ratio     0.0 - 30.0 2.1  ALDOSTERONE     0.0 - 30.0 ng/dL 4.2  Norepinephrine     0 - 874 pg/mL 781  Epinephrine     0 - 62 pg/mL 169 (H)  Dopamine     0 - 48 pg/mL <30  Hemoglobin A1C     4.8 - 5.6 % 6.2 (H)  Mean Plasma Glucose      131  Normetanephrine, Pl     0 - 145 pg/mL 116  Metanephrine, Pl     0 -  62 pg/mL 47  TSH     0.350 - 4.500 uIU/mL 1.671  Vitamin B12     180 - 914 pg/mL 296  RPR     Non Reactive Non Reactive  HIV     Non Reactive Non Reactive    Assessment: As you may recall, she is a 40 y.o. African American female with PMH of HTN, HLD, sickle cell, and obesity admitted on 04/30/15 for moderate right BG hemorrhage. CTA head showed no AVM or aneurysm. Repeat head CT stable hematoma. TTE EF 60-65%, LDL 108 and A1C 6.2. Initially BP high, put on cleviprex, later transitioned to po BP meds. Renal artery ultrasound negative, malignant HTN labs also unremarkable. During the interval time, the patient back to work. BP still not in good control, currently on clonidine 0.2mg  tid, hydralazine 10mg  qid, and lisinopril 20mg  bid. Will increase hydralazine to 50mg  tid. S/s of OSA, will do sleepy study. Check CUS to  finish stroke work up.  Plan:  - increase hydralazine to 50mg  three times a day - continue clonidine 0.2mg  three times a day and lisinopril 20mg  twice a day. - check BP at home and record and bring over to Dr. Belva Bertin for medication adjustment if needed - BP goal 120-140 - continue lipitor for stroke prevention - will do carotid doppler to rule out carotid stenosis. - will do sleep study to evaluate sleep apnea - Follow up with your primary care physician for stroke risk factor modification. Recommend maintain blood pressure goal around 130/80, diabetes with hemoglobin A1c goal below 6.5% and lipids with LDL cholesterol goal below 70 mg/dL.  - healthy diet and regular exercise - follow up in 3 months  I spent more than 25 minutes of face to face time with the patient. Greater than 50% of time was spent in counseling and coordination of care. We discussed BP goal, BP management and finish other stroke work up.    Orders Placed This Encounter  Procedures  . US Carotid Bilateral    Standing Status: Future     Number of Occurrences:      Standing Expiration Date: 08/20/2016    Order Specific Question:  Reason for Exam (SYMPTOM  OR DIAGNOSIS REQUIRED)    Answer:  stroke    Order Specific Question:  Preferred imaging location?    Answer:  Internal  . Ambulatory referral to Sleep Studies    Referral Priority:  Routine    Referral Type:  Consultation    Referral Reason:  Specialty Services Required    Number of Visits Requested:  1    Meds ordered this encounter  Medications  . hydrALAZINE (APRESOLINE) 50 MG tablet    Sig: Take 1 tablet (50 mg total) by mouth 3 (three) times daily.    Dispense:  90 tablet    Refill:  5    Patient Instructions  - increase hydralazine to 50mg  three times a day - continue clonidine 0.2mg  three times a day and lisinopril 20mg  twice a day. - check BP at home and record and bring over to Dr. Belva Bertin for medication adjustment if needed - BP goal  120-140 - depends on BP control, will decide your back to work before school year. - continue lipitor for stroke prevention - will do carotid doppler to rule out carotid stenosis. - will do sleep study to evaluate sleep apnea - Follow up with your primary care physician for stroke risk factor modification. Recommend maintain blood pressure goal around 130/80, diabetes with  hemoglobin A1c goal below 6.5% and lipids with LDL cholesterol goal below 70 mg/dL.  - healthy diet and regular exercise - follow up in 3 months   Rosalin Hawking, MD PhD [facility] [address], [place], [place] [address] [phone]

## 2015-06-24 ENCOUNTER — Telehealth: Payer: Self-pay | Admitting: Neurology

## 2015-06-24 NOTE — Telephone Encounter (Signed)
If patient calls back, Dr. Erlinda Hong did not order the allbutterol and nexium. He only prescribe a BP medication in her last office visit. Pt needs to call PCP.  LFt vm that Dr. Erlinda Hong never prescribed the two meds. Pt needs to contact PCP for refills.

## 2015-06-24 NOTE — Telephone Encounter (Signed)
Patient requesting refill of albuterol (PROVENTIL HFA;VENTOLIN HFA) 108 (90 BASE) MCG/ACT inhaler and esometrozole. Pt sts Dr Erlinda Hong ordered these for her.  Pharmacy: [facility]

## 2015-06-25 ENCOUNTER — Ambulatory Visit (INDEPENDENT_AMBULATORY_CARE_PROVIDER_SITE_OTHER): Payer: PRIVATE HEALTH INSURANCE

## 2015-06-25 DIAGNOSIS — I610 Nontraumatic intracerebral hemorrhage in hemisphere, subcortical: Secondary | ICD-10-CM | POA: Diagnosis not present

## 2015-07-02 ENCOUNTER — Telehealth: Payer: Self-pay

## 2015-07-02 ENCOUNTER — Institutional Professional Consult (permissible substitution): Payer: PRIVATE HEALTH INSURANCE | Admitting: Neurology

## 2015-07-02 NOTE — Telephone Encounter (Signed)
Pt did not show for their appt with Dr. Dohmeier today.  

## 2015-07-03 ENCOUNTER — Encounter: Payer: Self-pay | Admitting: Neurology

## 2015-07-04 ENCOUNTER — Telehealth: Payer: Self-pay | Admitting: *Deleted

## 2015-07-04 NOTE — Telephone Encounter (Signed)
LMVM for pt to return call for test results.  °

## 2015-07-04 NOTE — Telephone Encounter (Signed)
-----   Message from Rosalin Hawking, MD sent at 07/03/2015 10:33 PM EDT ----- Could you please let the patient know that the carotid ultrasound test done recently in our office was a normal study. Please continue current treatment. Thanks.  Rosalin Hawking, MD PhD Stroke Neurology 07/03/2015 10:33 PM

## 2015-07-08 ENCOUNTER — Encounter: Payer: Self-pay | Admitting: *Deleted

## 2015-07-08 NOTE — Telephone Encounter (Signed)
I called pt, but did not LM.  Work Oncologist.  Called mother who is on DPR (no answer).  Sent result note to pts home address.

## 2015-07-31 ENCOUNTER — Encounter: Payer: Self-pay | Admitting: Neurology

## 2015-07-31 ENCOUNTER — Ambulatory Visit (INDEPENDENT_AMBULATORY_CARE_PROVIDER_SITE_OTHER): Payer: PRIVATE HEALTH INSURANCE | Admitting: Neurology

## 2015-07-31 VITALS — BP 220/122 | HR 88 | Resp 18 | Ht 64.0 in | Wt 270.0 lb

## 2015-07-31 DIAGNOSIS — R030 Elevated blood-pressure reading, without diagnosis of hypertension: Secondary | ICD-10-CM | POA: Diagnosis not present

## 2015-07-31 DIAGNOSIS — I610 Nontraumatic intracerebral hemorrhage in hemisphere, subcortical: Secondary | ICD-10-CM | POA: Diagnosis not present

## 2015-07-31 DIAGNOSIS — R351 Nocturia: Secondary | ICD-10-CM | POA: Diagnosis not present

## 2015-07-31 DIAGNOSIS — G4733 Obstructive sleep apnea (adult) (pediatric): Secondary | ICD-10-CM | POA: Diagnosis not present

## 2015-07-31 DIAGNOSIS — I619 Nontraumatic intracerebral hemorrhage, unspecified: Secondary | ICD-10-CM | POA: Diagnosis not present

## 2015-07-31 NOTE — Progress Notes (Signed)
Subjective:    Patient ID: Belinda Ramirez is a 40 y.o. female.  HPI     Interim history:   Dear Belinda Ramirez,  I saw your patient, Belinda Ramirez, upon your kind request in my clinic today for initial consultation of her sleep disorder, in particular concern for underlying obstructive sleep apnea. As you know, [name] is a 39 year old right-handed woman with an underlying medical history of sickle cell, obesity, hypertension, hyperlipidemia, recent admission to the hospital on 04/30/2015 secondary to hypertensive emergency and right basal ganglia hemorrhage, who reports snoring and excessive daytime somnolence. I reviewed your office note from 06/19/2015. I also reviewed the hospital records from when she was admitted on 04/30/2015. She was discharged on 05/06/2015. CT and she will head with contrast on 05/01/2015 showed: No medium or large size intracranial arterial vessel significant stenosis or occlusion. No aneurysm or vascular malformation. Exam not optimized to evaluate venous structures. Minimal change in size of hematoma centered in the right caudate with breakthrough of hemorrhage into the lateral ventricles once again noted. When compared to prior examination, slight increase in degree of midline shift to the left and increase amount of dependent hemorrhage within the left lateral ventricle as detailed above. It is possible hemorrhage is related to hypertensive hemorrhage. Recommend evaluating this region on follow-up as hemorrhage clears to exclude underlying lesion.   Degree of hydrocephalus similar to yesterday's exam.   Exophthalmos. Prominence of the extra-ocular muscles. Question thyroid ophthalmopathy?   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 Head CT without contrast on 05/03/2015 showed: IMPRESSION: Right anterior basal ganglia with hemorrhage is stable. Intraventricular hemorrhage slightly  improved. Mild hydrocephalus and midline shift stable from the prior study. No new hemorrhage since yesterday. She had a carotid ultrasound on 06/25/2015 which showed no significant carotid artery stenosis bilaterally.  She takes a nap during the day. She worked in [employer]. She is a never smoker. She does not drink alcohol. She drinks cola, about 1 liter per day, regular, and sweet tea from [employer]. She has joined a gym. She admits that she did not take any of her blood pressure medication this morning as she was in a hurry and she had also not eaten any breakfast. Her blood pressure is high and upon repeat testing it was high again. She denies any headache, blurry vision, slurring of speech, chest pain, or shortness of breath. Her mother and her brother have obstructive sleep apnea and uses CPAP machines. Mother and brother have sarcoidosis. She reports that she was a snorer since childhood and has had witnessed apneas. She denies morning headaches but has nocturia, at least 3 times per night on average. She does not wake up rested. Her Epworth sleepiness score is 2 out of 24 today, she does admit to napping at least once per day currently as she is not working. She denies restless leg symptoms or leg twitching in her sleep.   Her Past Medical History Is Significant For: Past Medical History  Diagnosis Date  . Hypertension   . High cholesterol   . Sickle cell trait ([place])   . Obesity     Her Past Surgical History Is Significant For: Past Surgical History  Procedure Laterality Date  . Tubal ligation      Her Family History Is Significant For: Family History  Problem Relation Age of Onset  . Heart attack Maternal Grandfather   . Diabetes Mother   . Hypertension  Mother   . Cancer Father   . Heart disease Maternal Grandmother     Her Social History Is Significant For: Social History   Social History  . Marital Status: Single    Spouse Name: N/A  . Number of Children: 0  .  Years of Education: 48   Social History Main Topics  . Smoking status: Never Smoker   . Smokeless tobacco: None  . Alcohol Use: 0.0 oz/week    0 Standard drinks or equivalent per week     Comment: occaisonal  . Drug Use: No  . Sexual Activity: Yes    Birth Control/ Protection: Other-see comments     Comment: BTL   Other Topics Concern  . None   Social History Narrative   Drinks soda and tea daily     Her Allergies Are:  Allergies  Allergen Reactions  . Latex Itching  . Oxycodone Hives  . Percocet [Oxycodone-Acetaminophen] Hives  :   Her Current Medications Are:  Outpatient Encounter Prescriptions as of 07/31/2015  Medication Sig  . albuterol (PROVENTIL HFA;VENTOLIN HFA) 108 (90 BASE) MCG/ACT inhaler Inhale 1-2 puffs into the lungs every 6 (six) hours as needed for wheezing or shortness of breath (or persistent coughing).  Marland Kitchen atorvastatin (LIPITOR) 40 MG tablet Take 1 tablet (40 mg total) by mouth daily at 6 PM.  . cloNIDine (CATAPRES) 0.2 MG tablet Take 1 tablet (0.2 mg total) by mouth 3 (three) times daily.  Marland Kitchen esomeprazole (NEXIUM) 20 MG capsule Take 20 mg by mouth daily before breakfast.  . hydrALAZINE (APRESOLINE) 50 MG tablet Take 1 tablet (50 mg total) by mouth 3 (three) times daily.  Marland Kitchen lisinopril (PRINIVIL,ZESTRIL) 20 MG tablet Take 1 tablet (20 mg total) by mouth 2 (two) times daily.  . Multiple Vitamin (MULTIVITAMIN WITH MINERALS) TABS tablet Take 1 tablet by mouth daily.   No facility-administered encounter medications on file as of 07/31/2015.  :  Review of Systems:  Out of a complete 14 point review of systems, all are reviewed and negative with the exception of these symptoms as listed below:   Review of Systems  Neurological:       Snoring, witnessed apnea possibly since childhood, takes naps during the day   Epworth Sleepiness Scale 0= would never doze 1= slight chance of dozing 2= moderate chance of dozing 3= high chance of dozing  Sitting and  reading:0 Watching TV:1 Sitting inactive in a public place (ex. Theater or meeting):0 As a passenger in a car for an hour without a break:0 Lying down to rest in the afternoon:1 Sitting and talking to someone:0 Sitting quietly after lunch (no alcohol):0 In a car, while stopped in traffic:0 Total: 2  Objective:  Neurologic Exam  Physical Exam Physical Examination:   Filed Vitals:   07/31/15 1340  BP: 220/122  Pulse: 88  Resp: 18   General Examination: The patient is a very pleasant 40 y.o. female in no acute distress. She appears well-developed and well-nourished and well groomed. She denies CP/SOB/blurry vision. She has not taken any of her BP medication.   HEENT: Normocephalic, atraumatic, pupils are equal, round and reactive to light and accommodation. Funduscopic exam is normal with sharp disc margins noted. Extraocular tracking is good without limitation to gaze excursion or nystagmus noted. Normal smooth pursuit is noted. Hearing is grossly intact. Face is symmetric with normal facial animation and normal facial sensation. Speech is clear with no dysarthria noted. There is no hypophonia. There is no lip, neck/head,  jaw or voice tremor. Neck is supple with full range of passive and active motion. There are no carotid bruits on auscultation. Oropharynx exam reveals: mild mouth dryness, adequate dental hygiene and marked airway crowding, due to Larger tonsils, wider uvula, larger tongue. Uvula tip was not fully visualized and tonsils were not fully visualized. Mallampati is class III. Tongue protrudes centrally and palate elevates symmetrically. Neck size is 18.75 inches. She has a Mild overbite.   Chest: Clear to auscultation without wheezing, rhonchi or crackles noted.  Heart: S1+S2+0, regular and normal without murmurs, rubs or gallops noted.   Abdomen: Soft, non-tender and non-distended with normal bowel sounds appreciated on auscultation.  Extremities: There is no pitting edema  in the distal lower extremities bilaterally. Pedal pulses are intact.  Skin: Warm and dry without trophic changes noted. There are no varicose veins.  Musculoskeletal: exam reveals no obvious joint deformities, tenderness or joint swelling or erythema.   Neurologically:  Mental status: The patient is awake, alert and oriented in all 4 spheres. Her immediate and remote memory, attention, language skills and fund of knowledge are appropriate. There is no evidence of aphasia, agnosia, apraxia or anomia. Speech is clear with normal prosody and enunciation. Thought process is linear. Mood is normal and affect is normal.  Cranial nerves II - XII are as described above under HEENT exam. In addition: shoulder shrug is normal with equal shoulder height noted. Motor exam: Normal bulk, strength and tone is noted, with the exception of mild hip flexor weakness noted in the left lower extremity. There is no drift, tremor or rebound. Romberg is negative. Reflexes are 2+ throughout. Babinski: Toes are flexor bilaterally. Fine motor skills and coordination: intact with normal finger taps, normal hand movements, normal rapid alternating patting, normal foot taps and normal foot agility.  Cerebellar testing: No dysmetria or intention tremor on finger to nose testing. Heel to shin is slightly difficult with the left leg.  Sensory exam: intact to light touch in the upper and lower extremities.  Gait, station and balance: She stands with difficulty. No veering to one side is noted. No leaning to one side is noted. Posture is age-appropriate and stance is narrow based. Gait shows somewhat cautious gait, she walks slightly wide-based. Tandem walk difficult for her.                Assessment and Plan:  In summary, Paulena Kiang is a very pleasant 40 y.o.-year old female  with an underlying medical history of sickle cell, obesity, hypertension, hyperlipidemia, recent admission to the hospital on 04/30/2015 secondary to  hypertensive emergency and right basal ganglia hemorrhage, whose history and physical exam are in keeping with obstructive sleep apnea (OSA). of note, her blood pressure is elevated today, she denies any symptoms such as chest pain, shortness of breath, headache or blurry vision, she admits that she did not take any of her blood pressure medications this morning. She is strongly advised to go home and take her medications. Her boyfriend will be driving. She is strongly counseled regarding weight management and calorie intake today. She is advised to reduce her sugary drinks intake such as cola and sweet tea. Excess caffeine will right upper blood pressure and can damage the kidney function. In addition, the sugar content is very counterproductive for weight loss. I talked her about the risk of intracranial hemorrhage in the context of high blood pressure values, she seems to understand that it is very important that her blood pressure get under  control. I had a long chat with the patient about my findings and the diagnosis of OSA, its prognosis and treatment options. We talked about medical treatments, surgical interventions and non-pharmacological approaches. I explained in particular the risks and ramifications of untreated moderate to severe OSA, especially with respect to developing cardiovascular disease down the Road, including congestive heart failure, difficult to treat hypertension, cardiac arrhythmias, or stroke. Even type 2 diabetes has, in part, been linked to untreated OSA. Symptoms of untreated OSA include daytime sleepiness, memory problems, mood irritability and mood disorder such as depression and anxiety, lack of energy, as well as recurrent headaches, especially morning headaches. We talked about trying to maintain a healthy lifestyle in general, as well as the importance of weight control. I encouraged the patient to eat healthy, exercise daily and keep well hydrated, to keep a scheduled bedtime  and wake time routine, to not skip any meals and eat healthy snacks in between meals. I advised the patient not to drive when feeling sleepy. I recommended the following at this time: sleep study with potential positive airway pressure titration. (We will score hypopneas at 3% and split the sleep study into diagnostic and treatment portion, if the estimated. 2 hour AHI is >15/h).   I explained the sleep test procedure to the patient and also outlined possible surgical and non-surgical treatment options of OSA, including the use of a custom-made dental device (which would require a referral to a specialist dentist or oral surgeon), upper airway surgical options, such as pillar implants, radiofrequency surgery, tongue base surgery, and UPPP (which would involve a referral to an ENT surgeon). Rarely, jaw surgery such as mandibular advancement may be considered.  I also explained the CPAP treatment option to the patient, who indicated that she would be willing to try CPAP if the need arises. I explained the importance of being compliant with PAP treatment, not only for insurance purposes but primarily to improve Her symptoms, and for the patient's long term health benefit, including to reduce Her cardiovascular risks. I answered all her questions today and the patient was in agreement. I would like to see her back after the sleep study is completed and encouraged her to call with any interim questions, concerns, problems or updates.   Thank you very much for allowing me to participate in the care of this nice patient. If I can be of any further assistance to you please do not hesitate to talk to me.   Sincerely,   Star Age, MD, PhD

## 2015-07-31 NOTE — Patient Instructions (Addendum)
Based on your symptoms and your exam I believe you are at risk for obstructive sleep apnea or OSA, and I think we should proceed with a sleep study to determine whether you do or do not have OSA and how severe it is. If you have more than mild OSA, I want you to consider treatment with CPAP. Please remember, the risks and ramifications of moderate to severe obstructive sleep apnea or OSA are: Cardiovascular disease, including congestive heart failure, stroke, difficult to control hypertension, arrhythmias, and even type 2 diabetes has been linked to untreated OSA. Sleep apnea causes disruption of sleep and sleep deprivation in most cases, which, in turn, can cause recurrent headaches, problems with memory, mood, concentration, focus, and vigilance. Most people with untreated sleep apnea report excessive daytime sleepiness, which can affect their ability to drive. Please do not drive if you feel sleepy.   I will likely see you back after your sleep study to go over the test results and where to go from there. We will call you after your sleep study to advise about the results (most likely, you will hear from Beverlee Nims, my nurse) and to set up an appointment at the time, as necessary.    Our sleep lab administrative assistant, Arrie Aran will meet with you or call you to schedule your sleep study. If you don't hear back from her by next week please feel free to call her at [phone]. This is her direct line and please leave a message with your phone number to call back if you get the voicemail box. She will call back as soon as possible.   Please reduce your sugary drinks and reduce your caffeine intake, work on weight loss. Do not skip your BP medication!

## 2015-09-23 ENCOUNTER — Ambulatory Visit (INDEPENDENT_AMBULATORY_CARE_PROVIDER_SITE_OTHER): Payer: PRIVATE HEALTH INSURANCE | Admitting: Neurology

## 2015-09-23 ENCOUNTER — Encounter: Payer: Self-pay | Admitting: Neurology

## 2015-09-23 VITALS — Ht 64.0 in | Wt 277.8 lb

## 2015-09-23 DIAGNOSIS — E669 Obesity, unspecified: Secondary | ICD-10-CM

## 2015-09-23 DIAGNOSIS — G4733 Obstructive sleep apnea (adult) (pediatric): Secondary | ICD-10-CM | POA: Diagnosis not present

## 2015-09-23 DIAGNOSIS — I10 Essential (primary) hypertension: Secondary | ICD-10-CM | POA: Diagnosis not present

## 2015-09-23 DIAGNOSIS — I610 Nontraumatic intracerebral hemorrhage in hemisphere, subcortical: Secondary | ICD-10-CM

## 2015-09-23 DIAGNOSIS — E785 Hyperlipidemia, unspecified: Secondary | ICD-10-CM

## 2015-09-23 MED ORDER — CLONIDINE HCL 0.3 MG PO TABS: 0.3000 mg | ORAL_TABLET | Freq: Three times a day (TID) | ORAL | 5 refills | Status: DC

## 2015-09-23 NOTE — Patient Instructions (Signed)
-   increase clonidine to 0.3mg  three times a day - continue hydralazine three times a day and lisinopril 20mg  twice a day. - check BP at home and record and bring over to Dr. Belva Bertin for medication adjustment if needed - BP goal 120-140 - continue lipitor for stroke prevention - follow up with sleep study schedule - Follow up with your primary care physician for stroke risk factor modification. Recommend maintain blood pressure goal around 130/80, diabetes with hemoglobin A1c goal below 6.5% and lipids with LDL cholesterol goal below 70 mg/dL.  - healthy diet and regular exercise - follow up in 6 months

## 2015-09-23 NOTE — Progress Notes (Signed)
STROKE NEUROLOGY FOLLOW UP NOTE  NAME: Francella Crehan DOB: Sep 18, 1975  REASON FOR VISIT: stroke follow up HISTORY FROM: pt and chart  Today we had the pleasure of seeing Kianna Levay in follow-up at our Neurology Clinic. Pt was accompanied by no one.   History Summary Ms. Mckenze Nissley is a 40 y.o. female with history of HTN, HLD, sickle cell, and obesity admitted on 04/30/15 for HA and left UE and LE mild weakness. CT head showed right BG hemorrhage. CTA head showed no AVM or aneurysm. Repeat head CT stable hematoma. TTE EF 60-65%, LDL 108 and A1C 6.2. Initially BP high, put on cleviprex, later transitioned to po BP meds. Renal artery ultrasound negative, malignant HTN labs also unremarkable. Pt was discharged in good condition to home with home PT/OT after stabilization.   06/19/15 follow up - the patient has been doing well. Symptoms resolved. Complains of easy fatigue. 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 BP still high today in clinic, 170/101. She stated similar BP at home. Currently on clonidine 0.2mg  tid, hydralazine 10mg  qid, and lisinopril 20mg  bid.   Interval History During the interval time, pt has been doing well. No complains. 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 Her BP at home 160/90 but today in clinic still high. She stated compliance with medication. She is going to gym for weight loss and exercise. Has seen by Dr. Rexene Alberts for OSA and pending sleep study.  REVIEW OF SYSTEMS: Full 14 system review of systems performed and notable only for those listed below and in HPI above, all others are negative:  Constitutional:   Cardiovascular:  Ear/Nose/Throat:   Skin:  Eyes:   Respiratory:   Gastroitestinal:   Genitourinary:  Hematology/Lymphatic:   Endocrine:  Musculoskeletal:   Allergy/Immunology:   Neurological:   Psychiatric:  Sleep:   The following represents the patient's updated allergies and side  effects list: Allergies  Allergen Reactions  . Latex Itching  . Oxycodone Hives  . Percocet [Oxycodone-Acetaminophen] Hives    The neurologically relevant items on the patient's problem list were reviewed on today's visit.  Neurologic Examination  A problem focused neurological exam (12 or more points of the single system neurologic examination, vital signs counts as 1 point, cranial nerves count for 8 points) was performed.  Height 5\' 4"  (1.626 m), weight 277 lb 12.8 oz (126 kg), last menstrual period 09/06/2015.  General - morbid, obesity, well developed, in no apparent distress.  Ophthalmologic - Fundi not visualized due to small pupils.  Cardiovascular - Regular rate and rhythm.  Mental Status -  Level of arousal and orientation to time, place, and person were intact. Language including expression, naming, repetition, comprehension was assessed and found intact. Attention span and concentration were normal. Recent and remote memory were intact. Fund of Knowledge was assessed and was intact.  Cranial Nerves II - XII - II - Visual field intact OU III, IV, VI - Extraocular movements intact. V - Facial sensation intact bilaterally. VII - Facial movement intact bilaterally. VIII - Hearing & vestibular intact bilaterally. X - Palate elevates symmetrically. XI - Chin turning & shoulder shrug intact bilaterally. XII - Tongue protrusion intact  Motor Strength - The patient's strength was normal in all extremities and pronator drift was absent.  Bulk was normal and fasciculations were absent.   Motor Tone - Muscle tone was assessed at the neck and appendages and was normal.  Reflexes - The patient's reflexes were 1+ in all extremities and she had no pathological reflexes.  Sensory - Light touch, temperature/pinprick were assessed and were normal.    Coordination - The patient had normal movements in the hands and feet with no ataxia or dysmetria.  Tremor was absent.  Gait  and Station - The patient's transfers, posture, gait, station, and turns were observed as normal.   Data reviewed: I personally reviewed the images and agree with the radiology interpretations.  Ct Angio Head W/cm &/or Wo Cm 05/01/2015  No medium or large size intracranial arterial vessel significant stenosis or occlusion. No aneurysm or vascular malformation. Exam not optimized to evaluate venous structures. Minimal change in size of hematoma centered in the right caudate with breakthrough of hemorrhage into the lateral ventricles once again noted. When compared to prior examination, slight increase in degree of midline shift to the left and increase amount of dependent hemorrhage within the left lateral ventricle as detailed above. It is possible hemorrhage is related to hypertensive hemorrhage. Recommend evaluating this region on follow-up as hemorrhage clears to exclude underlying lesion. Degree of hydrocephalus similar to yesterday's exam. Exophthalmos. Prominence of the extra-ocular muscles. Question thyroid ophthalmopathy? 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   Ct Head Wo Contrast 04/30/2015  Acute right basal gangliar intraparenchymal hemorrhage with intraventricular extension, involving right lateral ventricle, third ventricle, frontal horn of the left lateral ventricle, as well as the aqua duct and fourth ventricle. Evidence of early hydrocephalus. 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   TTE - - LVEF 60-65%, severely increased LV wall thickness (>1.6 cm),  normal wall motion, diastolic dysfunction with indeterminate LV  filling pressure, mildly dilated RA, normal IVC.  Renal artery duplex - Preliminary findings: Technically difficult study due to body habitus and poor patient postition. No obvious renal artery stenosis identified bilaterally.  Repeat CT head 05/03/2015   Right anterior basal ganglia with hemorrhage is stable. Intraventricular hemorrhage slightly improved.  Mild hydrocephalus and midline shift stable from the prior study. No new hemorrhage since yesterday.  CUS - no significant stenosis of ICAs. Antegrade VAs.  Component     Latest Ref Rng 05/01/2015  Cholesterol     0 - 200 mg/dL 187  Triglycerides     <150 mg/dL 80  HDL Cholesterol     >40 mg/dL 63  Total CHOL/HDL Ratio      3.0  VLDL     0 - 40 mg/dL 16  LDL (calc)     0 - 99 mg/dL 108 (H)  PRA LC/MS/MS     0.167 - 5.380 ng/mL/hr 2.011  ALDO / PRA Ratio     0.0 - 30.0 2.1  ALDOSTERONE     0.0 - 30.0 ng/dL 4.2  Norepinephrine     0 - 874 pg/mL 781  Epinephrine     0 - 62 pg/mL 169 (H)  Dopamine     0 - 48 pg/mL <30  Hemoglobin A1C     4.8 - 5.6 % 6.2 (H)  Mean Plasma Glucose      131  Normetanephrine, Pl     0 - 145 pg/mL 116  Metanephrine, Pl     0 - 62 pg/mL 47  TSH     0.350 - 4.500 uIU/mL 1.671  Vitamin B12     180 - 914 pg/mL 296  RPR     Non Reactive Non  Reactive  HIV     Non Reactive Non Reactive    Assessment: As you may recall, she is a 40 y.o. African American female with PMH of HTN, HLD, sickle cell, and obesity admitted on 04/30/15 for moderate right BG hemorrhage. CTA head showed no AVM or aneurysm. Repeat head CT stable hematoma. TTE EF 60-65%, LDL 108 and A1C 6.2. Initially BP high, put on cleviprex, later transitioned to po BP meds. Renal artery ultrasound negative, malignant HTN labs also unremarkable. During the interval time, the patient back to work. BP still not in good control, on clonidine 0.2mg  tid, hydralazine 10mg  qid, and lisinopril 20mg  bid. Has increased hydralazine to 50mg  tid. Will increase clonidine to 0.3mg  tid this time. S/s of OSA, referred to Dr. Rexene Alberts and penidng sleepy study. CUS negative.   Plan:  - increase clonidine to 0.3mg  three times a day - continue hydralazine three times a day and lisinopril 20mg  twice a day. -  check BP at home and record and bring over to Dr. Belva Bertin for medication adjustment if needed - BP goal 120-140 - continue lipitor for stroke prevention - follow up with sleep study schedule - Follow up with your primary care physician for stroke risk factor modification. Recommend maintain blood pressure goal around 130/80, diabetes with hemoglobin A1c goal below 6.5% and lipids with LDL cholesterol goal below 70 mg/dL.  - healthy diet and regular exercise - follow up in 6 months  I spent more than 25 minutes of face to face time with the patient. Greater than 50% of time was spent in counseling and coordination of care. We discussed BP goal, BP management and exercise for weight loss and back to work letter.    No orders of the defined types were placed in this encounter.   Meds ordered this encounter  Medications  . cloNIDine (CATAPRES) 0.3 MG tablet    Sig: Take 1 tablet (0.3 mg total) by mouth 3 (three) times daily.    Dispense:  90 tablet    Refill:  5    Patient Instructions  - increase clonidine to 0.3mg  three times a day - continue hydralazine three times a day and lisinopril 20mg  twice a day. - check BP at home and record and bring over to Dr. Belva Bertin for medication adjustment if needed - BP goal 120-140 - continue lipitor for stroke prevention - follow up with sleep study schedule - Follow up with your primary care physician for stroke risk factor modification. Recommend maintain blood pressure goal around 130/80, diabetes with hemoglobin A1c goal below 6.5% and lipids with LDL cholesterol goal below 70 mg/dL.  - healthy diet and regular exercise - follow up in 6 months   Rosalin Hawking, MD PhD [facility] [address], [place], [place] [address] [phone]

## 2015-10-30 ENCOUNTER — Ambulatory Visit (HOSPITAL_COMMUNITY)
Admission: EM | Admit: 2015-10-30 | Discharge: 2015-10-30 | Disposition: A | Discharge status: Home / Self Care | Payer: PRIVATE HEALTH INSURANCE | Attending: Internal Medicine | Admitting: Internal Medicine

## 2015-10-30 ENCOUNTER — Encounter (HOSPITAL_COMMUNITY): Payer: Self-pay | Admitting: Emergency Medicine

## 2015-10-30 DIAGNOSIS — J189 Pneumonia, unspecified organism: Secondary | ICD-10-CM

## 2015-10-30 MED ORDER — AZITHROMYCIN 250 MG PO TABS: 250.0000 mg | ORAL_TABLET | Freq: Every day | ORAL | 0 refills | Status: DC

## 2015-10-30 MED ORDER — PREDNISONE 10 MG (21) PO TBPK: 10.0000 mg | ORAL_TABLET | Freq: Every day | ORAL | 0 refills | Status: DC

## 2015-10-30 MED ORDER — ALBUTEROL SULFATE HFA 108 (90 BASE) MCG/ACT IN AERS
1.0000 | INHALATION_SPRAY | Freq: Four times a day (QID) | RESPIRATORY_TRACT | 1 refills | Status: DC | PRN
Start: 2015-10-30 — End: 2016-04-12

## 2015-10-30 MED ORDER — AMOXICILLIN 875 MG PO TABS: 875.0000 mg | ORAL_TABLET | Freq: Two times a day (BID) | ORAL | 0 refills | Status: AC

## 2015-10-30 NOTE — ED Triage Notes (Signed)
Cough is particularly bad at night.  Patient reports non-productive cough.  Patient has drainage in throat, sore throat.  No ear pain.

## 2015-10-30 NOTE — ED Provider Notes (Signed)
[facility]    CSN: [id number] Arrival date & time: 10/30/15  1429  First Provider Contact:  None       History   Chief Complaint Chief Complaint  Patient presents with  . URI    HPI Belinda Ramirez is a 40 y.o. female.   Productive cough x1 week.  SOB with exertion.  Cough worse at night.  PMH significant for chronic bronchitis/asthma.  No longer has inhaler.       Past Medical History:  Diagnosis Date  . High cholesterol   . Hypertension   . Obesity   . Sickle cell trait ([place])   . Stroke [facility])     Patient Active Problem List   Diagnosis Date Noted  . OSA (obstructive sleep apnea) 06/19/2015  . 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 HTN (hypertension), malignant   . HLD (hyperlipidemia)   . Morbid obesity due to excess calories ([place])   . Intraparenchymal hematoma of brain ([place]) 04/30/2015  . ICH (intracerebral hemorrhage) ([place]) 04/30/2015  . Hypertensive emergency   . Hyperlipidemia   . Obesity   . AKI (acute kidney injury) ([place])   . Hypokalemia     Past Surgical History:  Procedure Laterality Date  . TUBAL LIGATION      OB History    No data available       Home Medications    Prior to Admission medications   Medication Sig Start Date End Date Taking? Authorizing Provider  albuterol (PROVENTIL HFA;VENTOLIN HFA) 108 (90 Base) MCG/ACT inhaler Inhale 1-2 puffs into the lungs every 6 (six) hours as needed for wheezing or shortness of breath (or persistent coughing). 10/30/15   Harrie Foreman, MD  amoxicillin (AMOXIL) 875 MG tablet Take 1 tablet (875 mg total) by mouth 2 (two) times daily. 10/30/15 11/09/15  Harrie Foreman, MD  atorvastatin (LIPITOR) 40 MG tablet Take 1 tablet (40 mg total) by mouth daily at 6 PM. 05/06/15   David L Rinehuls, PA-C  azithromycin (ZITHROMAX) 250 MG tablet Take 1 tablet (250 mg total) by mouth daily. Take first 2 tablets together, then 1 every day until finished. 10/30/15   Harrie Foreman, MD    cloNIDine (CATAPRES) 0.3 MG tablet Take 1 tablet (0.3 mg total) by mouth 3 (three) times daily. 09/23/15   Rosalin Hawking, MD  esomeprazole (NEXIUM) 20 MG capsule Take 20 mg by mouth daily before breakfast.    Historical Provider, MD  hydrALAZINE (APRESOLINE) 50 MG tablet Take 1 tablet (50 mg total) by mouth 3 (three) times daily. 06/19/15   Rosalin Hawking, MD  lisinopril (PRINIVIL,ZESTRIL) 20 MG tablet Take 1 tablet (20 mg total) by mouth 2 (two) times daily. 05/06/15   David L Rinehuls, PA-C  Multiple Vitamin (MULTIVITAMIN WITH MINERALS) TABS tablet Take 1 tablet by mouth daily.    Historical Provider, MD  predniSONE (STERAPRED UNI-PAK 21 TAB) 10 MG (21) TBPK tablet Take 1 tablet (10 mg total) by mouth daily. Take 6 tabs by mouth daily  for 2 days, then 5 tabs for 2 days, then 4 tabs for 2 days, then 3 tabs for 2 days, 2 tabs for 2 days, then 1 tab by mouth daily for 2 days 10/30/15   Harrie Foreman, MD    Family History Family History  Problem Relation Age of Onset  . Heart attack Maternal Grandfather   . Diabetes Mother   . Hypertension Mother   . Cancer Father   . Heart disease Maternal  Grandmother     Social History Social History  Substance Use Topics  . Smoking status: Never Smoker  . Smokeless tobacco: Never Used  . Alcohol use 0.0 oz/week     Comment: occaisonal     Allergies   Latex; Oxycodone; and Percocet [oxycodone-acetaminophen]   Review of Systems Review of Systems  Constitutional: Negative for chills and fever.  HENT: Negative for sore throat and tinnitus.   Eyes: Negative for redness.  Respiratory: Positive for cough. Negative for shortness of breath.   Cardiovascular: Negative for chest pain and palpitations.  Gastrointestinal: Negative for abdominal pain, diarrhea, nausea and vomiting.  Genitourinary: Negative for dysuria, frequency and urgency.  Musculoskeletal: Negative for myalgias.  Skin: Negative for rash.       No lesions  Neurological: Negative for  weakness.  Hematological: Does not bruise/bleed easily.  Psychiatric/Behavioral: Negative for suicidal ideas.     Physical Exam Triage Vital Signs ED Triage Vitals  Enc Vitals Group     BP 10/30/15 1535 (!) 187/112     Pulse Rate 10/30/15 1535 86     Resp 10/30/15 1535 21     Temp 10/30/15 1535 99.1 F (37.3 C)     Temp Source 10/30/15 1535 Oral     SpO2 10/30/15 1535 98 %     Weight --      Height --      Head Circumference --      Peak Flow --      Pain Score 10/30/15 1540 7     Pain Loc --      Pain Edu? --      Excl. in [place]? --    No data found.   Updated Vital Signs BP (!) 187/112 (BP Location: Left Arm)   Pulse 86   Temp 99.1 F (37.3 C) (Oral)   Resp 21   LMP 10/30/2015   SpO2 98%   Visual Acuity Right Eye Distance:   Left Eye Distance:   Bilateral Distance:    Right Eye Near:   Left Eye Near:    Bilateral Near:     Physical Exam  Constitutional: She is oriented to person, place, and time. She appears well-developed and well-nourished. No distress.  HENT:  Head: Normocephalic and atraumatic.  Mouth/Throat: Oropharynx is clear and moist.  Eyes: Conjunctivae and EOM are normal. Pupils are equal, round, and reactive to light. No scleral icterus.  Neck: Normal range of motion. Neck supple. No JVD present. No tracheal deviation present. No thyromegaly present.  Cardiovascular: Normal rate, regular rhythm and normal heart sounds.  Exam reveals no gallop and no friction rub.   No murmur heard. Pulmonary/Chest: Effort normal. She has rhonchi in the left lower field.  Abdominal: Soft. Bowel sounds are normal. She exhibits no distension. There is no tenderness.  Musculoskeletal: Normal range of motion. She exhibits no edema.  Lymphadenopathy:    She has no cervical adenopathy.  Neurological: She is alert and oriented to person, place, and time. No cranial nerve deficit.  Skin: Skin is warm and dry.  Psychiatric: She has a normal mood and affect. Her behavior  is normal. Judgment and thought content normal.  Nursing note and vitals reviewed.    UC Treatments / Results  Labs (all labs ordered are listed, but only abnormal results are displayed) Labs Reviewed - No data to display  EKG  EKG Interpretation None       Radiology No results found.  Procedures Procedures (including critical care time)  Medications Ordered in UC Medications - No data to display   Initial Impression / Assessment and Plan / UC Course  I have reviewed the triage vital signs and the nursing notes.  Pertinent labs & imaging results that were available during my care of the patient were reviewed by me and considered in my medical decision making (see chart for details).  Clinical Course    CAP w/ underlying asthma. Abx + steroid taper.  Final Clinical Impressions(s) / UC Diagnoses   Final diagnoses:  Community acquired pneumonia    New Prescriptions New Prescriptions   AMOXICILLIN (AMOXIL) 875 MG TABLET    Take 1 tablet (875 mg total) by mouth 2 (two) times daily.   AZITHROMYCIN (ZITHROMAX) 250 MG TABLET    Take 1 tablet (250 mg total) by mouth daily. Take first 2 tablets together, then 1 every day until finished.   PREDNISONE (STERAPRED UNI-PAK 21 TAB) 10 MG (21) TBPK TABLET    Take 1 tablet (10 mg total) by mouth daily. Take 6 tabs by mouth daily  for 2 days, then 5 tabs for 2 days, then 4 tabs for 2 days, then 3 tabs for 2 days, 2 tabs for 2 days, then 1 tab by mouth daily for 2 days     Harrie Foreman, MD 10/30/15 1620

## 2015-11-18 ENCOUNTER — Emergency Department (HOSPITAL_COMMUNITY)
Admission: EM | Admit: 2015-11-18 | Discharge: 2015-11-18 | Discharge status: Against Medical Advice | Payer: PRIVATE HEALTH INSURANCE

## 2015-11-18 ENCOUNTER — Ambulatory Visit (HOSPITAL_COMMUNITY)
Admission: EM | Admit: 2015-11-18 | Discharge: 2015-11-18 | Disposition: A | Discharge status: Home / Self Care | Payer: No Typology Code available for payment source | Attending: Family Medicine | Admitting: Family Medicine

## 2015-11-18 ENCOUNTER — Encounter (HOSPITAL_COMMUNITY): Payer: Self-pay | Admitting: Family Medicine

## 2015-11-18 DIAGNOSIS — M7989 Other specified soft tissue disorders: Secondary | ICD-10-CM | POA: Diagnosis not present

## 2015-11-18 DIAGNOSIS — M79662 Pain in left lower leg: Secondary | ICD-10-CM

## 2015-11-18 DIAGNOSIS — I10 Essential (primary) hypertension: Secondary | ICD-10-CM

## 2015-11-18 NOTE — ED Triage Notes (Signed)
Pt here for left knee pain, swelling and LLE pain and swelling. sts x 2 weeks. sts that she is on her feet a lot at work. Pt leg swollen and warm to touch.

## 2015-11-18 NOTE — ED Notes (Signed)
Called Pt. No response.

## 2015-11-18 NOTE — Discharge Instructions (Signed)
Please go to the ER now for further evaluation of left lower leg pain and swelling. We are concerned about a possible blood clot. You will need to go to the ER for studies to determine if you have a blood clot.

## 2015-11-18 NOTE — ED Triage Notes (Signed)
Pt called multiple times to triage without response

## 2015-11-18 NOTE — ED Provider Notes (Signed)
CSN: [id number]     Arrival date & time 11/18/15  1216 History   First MD Initiated Contact with Patient 11/18/15 1354     Chief Complaint  Patient presents with  . Knee Pain  . Leg Pain   (Consider location/radiation/quality/duration/timing/severity/associated sxs/prior Treatment) 40 year old female presents with left lower leg pain and swelling as well as left knee pain. Started about 2 weeks ago and the pain has become more severe in the past 2 days. Left leg distinctly more swollen that right leg. She is on her feet for work and uncertain if swelling coming from standing a lot at work. Also blood pressure very elevated today- did not take her blood pressure medication. Has history of stroke in March 2016.    The history is provided by the patient.  Leg Pain    Past Medical History:  Diagnosis Date  . High cholesterol   . Hypertension   . Obesity   . Sickle cell trait ([place])   . Stroke [facility])    Past Surgical History:  Procedure Laterality Date  . TUBAL LIGATION     Family History  Problem Relation Age of Onset  . Heart attack Maternal Grandfather   . Diabetes Mother   . Hypertension Mother   . Cancer Father   . Heart disease Maternal Grandmother    Social History  Substance Use Topics  . Smoking status: Never Smoker  . Smokeless tobacco: Never Used  . Alcohol use 0.0 oz/week     Comment: occaisonal   OB History    No data available     Review of Systems  Constitutional: Negative for chills.  Respiratory: Negative for chest tightness and shortness of breath.   Cardiovascular: Negative for chest pain.  Musculoskeletal: Positive for joint swelling and myalgias.  Skin: Negative for rash.  Neurological: Positive for weakness and headaches. Negative for numbness.    Allergies  Latex; Oxycodone; and Percocet [oxycodone-acetaminophen]  Home Medications   Prior to Admission medications   Medication Sig Start Date End Date Taking? Authorizing Provider  albuterol  (PROVENTIL HFA;VENTOLIN HFA) 108 (90 Base) MCG/ACT inhaler Inhale 1-2 puffs into the lungs every 6 (six) hours as needed for wheezing or shortness of breath (or persistent coughing). 10/30/15   Harrie Foreman, MD  atorvastatin (LIPITOR) 40 MG tablet Take 1 tablet (40 mg total) by mouth daily at 6 PM. 05/06/15   Early Chars Rinehuls, PA-C  cloNIDine (CATAPRES) 0.3 MG tablet Take 1 tablet (0.3 mg total) by mouth 3 (three) times daily. 09/23/15   Rosalin Hawking, MD  esomeprazole (NEXIUM) 20 MG capsule Take 20 mg by mouth daily before breakfast.    Historical Provider, MD  hydrALAZINE (APRESOLINE) 50 MG tablet Take 1 tablet (50 mg total) by mouth 3 (three) times daily. 06/19/15   Rosalin Hawking, MD  lisinopril (PRINIVIL,ZESTRIL) 20 MG tablet Take 1 tablet (20 mg total) by mouth 2 (two) times daily. 05/06/15   David L Rinehuls, PA-C  Multiple Vitamin (MULTIVITAMIN WITH MINERALS) TABS tablet Take 1 tablet by mouth daily.    Historical Provider, MD   Meds Ordered and Administered this Visit  Medications - No data to display  BP (!) 227/137 (BP Location: Right Wrist)   Pulse 77   Temp 98.5 F (36.9 C) (Oral)   Resp 15   LMP 10/30/2015   SpO2 100%  No data found.   Physical Exam  Constitutional: She is oriented to person, place, and time. She appears well-developed and  well-nourished. No distress.  Cardiovascular: Normal rate and regular rhythm.   Pulmonary/Chest: Effort normal.  Musculoskeletal: She exhibits edema and tenderness.       Left knee: She exhibits decreased range of motion and swelling. She exhibits no ecchymosis and no erythema. Tenderness found.       Legs: Left lower leg swelling from popliteal fossa to just above ankle. Warm to touch and very tender- especially mid calf. Left knee joint also tender along lateral meniscus with slight swelling. Distal pulses are normal and equal bilaterally. No neuro deficit noted.   Neurological: She is alert and oriented to person, place, and time. She has  normal strength. No sensory deficit.  Skin: Skin is warm.  Psychiatric: She has a normal mood and affect. Her behavior is normal. Judgment and thought content normal.    Urgent Care Course   Clinical Course    Procedures (including critical care time)  Labs Review Labs Reviewed - No data to display  Imaging Review No results found.   Visual Acuity Review  Right Eye Distance:   Left Eye Distance:   Bilateral Distance:    Right Eye Near:   Left Eye Near:    Bilateral Near:         MDM   1. Pain and swelling of left lower leg   2. Elevated systolic blood pressure reading with diagnosis of hypertension    Due to unilateral left lower leg swelling and pain and very elevated blood pressure, recommend patient go to ER ASAP for further evaluation to rule out possible DVT. Patient understands and plans to go to ER now for evaluation.     Katy Apo, NP 11/18/15 1430

## 2015-11-19 ENCOUNTER — Emergency Department (HOSPITAL_BASED_OUTPATIENT_CLINIC_OR_DEPARTMENT_OTHER)
Admit: 2015-11-19 | Discharge: 2015-11-19 | Disposition: A | Discharge status: Home / Self Care | Payer: PRIVATE HEALTH INSURANCE

## 2015-11-19 ENCOUNTER — Emergency Department (HOSPITAL_COMMUNITY)
Admission: EM | Admit: 2015-11-19 | Discharge: 2015-11-19 | Disposition: A | Discharge status: Home / Self Care | Payer: PRIVATE HEALTH INSURANCE | Attending: Emergency Medicine | Admitting: Emergency Medicine

## 2015-11-19 ENCOUNTER — Encounter (HOSPITAL_COMMUNITY): Payer: Self-pay | Admitting: *Deleted

## 2015-11-19 DIAGNOSIS — N289 Disorder of kidney and ureter, unspecified: Secondary | ICD-10-CM | POA: Insufficient documentation

## 2015-11-19 DIAGNOSIS — M7989 Other specified soft tissue disorders: Secondary | ICD-10-CM | POA: Diagnosis not present

## 2015-11-19 DIAGNOSIS — M79605 Pain in left leg: Secondary | ICD-10-CM

## 2015-11-19 DIAGNOSIS — M79662 Pain in left lower leg: Secondary | ICD-10-CM | POA: Diagnosis present

## 2015-11-19 DIAGNOSIS — Z91040 Latex allergy status: Secondary | ICD-10-CM | POA: Diagnosis not present

## 2015-11-19 DIAGNOSIS — Z79899 Other long term (current) drug therapy: Secondary | ICD-10-CM | POA: Insufficient documentation

## 2015-11-19 DIAGNOSIS — M79609 Pain in unspecified limb: Secondary | ICD-10-CM | POA: Diagnosis not present

## 2015-11-19 DIAGNOSIS — Z8673 Personal history of transient ischemic attack (TIA), and cerebral infarction without residual deficits: Secondary | ICD-10-CM | POA: Diagnosis not present

## 2015-11-19 DIAGNOSIS — I10 Essential (primary) hypertension: Secondary | ICD-10-CM

## 2015-11-19 LAB — I-STAT CHEM 8, ED
BUN: 19 mg/dL (ref 6–20)
CALCIUM ION: 1.14 mmol/L — AB (ref 1.15–1.40)
Chloride: 100 mmol/L — ABNORMAL LOW (ref 101–111)
Creatinine, Ser: 1.4 mg/dL — ABNORMAL HIGH (ref 0.44–1.00)
Glucose, Bld: 92 mg/dL (ref 65–99)
HCT: 38 % (ref 36.0–46.0)
HEMOGLOBIN: 12.9 g/dL (ref 12.0–15.0)
Potassium: 3.4 mmol/L — ABNORMAL LOW (ref 3.5–5.1)
SODIUM: 140 mmol/L (ref 135–145)
TCO2: 28 mmol/L (ref 0–100)

## 2015-11-19 MED ORDER — MELOXICAM 7.5 MG PO TABS: 7.5000 mg | ORAL_TABLET | Freq: Every day | ORAL | 0 refills | Status: DC | PRN

## 2015-11-19 NOTE — Discharge Instructions (Signed)
Read the information below.  Use the prescribed medication as directed.  Please discuss all new medications with your pharmacist.  You may return to the Emergency Department at any time for worsening condition or any new symptoms that concern you.     If you develop uncontrolled pain, weakness or numbness of the extremity, severe discoloration of the skin, or you are unable to wal , return to the ER for a recheck.

## 2015-11-19 NOTE — ED Notes (Signed)
Called pt for blood draw,  No answer.

## 2015-11-19 NOTE — ED Notes (Signed)
Unable to locate pt.  Attempted to call pt's number and it goes straight to voicemail.

## 2015-11-19 NOTE — ED Notes (Signed)
The pt is here for lt leg pain and swelling for a long time.  She had a stroke in march  She was seen at [facility] yesterday   Alert oriented skin warm and d ry no distress

## 2015-11-19 NOTE — Progress Notes (Signed)
*  PRELIMINARY RESULTS* Vascular Ultrasound Left lower extremity venous duplex has been completed.  Preliminary findings: No evidence of DVT or baker's cyst. Multiple non-thrombosed varicose veins noted in the left calf and thigh.   Landry Mellow, RDMS, RVT  11/19/2015, 5:53 PM

## 2015-11-19 NOTE — ED Triage Notes (Signed)
Pt reports having left leg pain and swelling, pain to posterior leg. Went to [facility] yesterday and sent here for vascular study but pt was unable to stay. No acute distress noted at triage.

## 2015-11-19 NOTE — ED Notes (Signed)
No answer in waiting area.

## 2015-11-19 NOTE — ED Provider Notes (Signed)
[place] DEPT Provider Note   CSN: [id number] Arrival date & time: 11/19/15  1651     History   Chief Complaint Chief Complaint  Patient presents with  . Leg Pain    HPI Belinda Ramirez is a 40 y.o. female.  HPI   Pt with hx HTN, HLD, stroke with residual left sided weakness presents with several months of left lower leg swelling and pain.  The pain is located in the proximal calf and superior to the knee anteriorly.  The pain is intermittent, crampy pain, often worse at night or in certain positions.  Denies fevers, recent falls or injury, back pain, weakness or numbness of the leg.  Was sent by UC for lower extremity venous duplex US, which is negative.  Pt reports she did take her blood pressure medications today, she has not been checking her pressures at home recently.    Past Medical History:  Diagnosis Date  . High cholesterol   . Hypertension   . Obesity   . Sickle cell trait ([place])   . Stroke [facility]     Patient Active Problem List   Diagnosis Date Noted  . OSA (obstructive sleep apnea) 06/19/2015  . Cytotoxic brain edema ([place]) 05/05/2015  . HTN (hypertension), malignant   . HLD (hyperlipidemia)   . Morbid obesity due to excess calories ([place])   . Intraparenchymal hematoma of brain ([place]) 04/30/2015  . ICH (intracerebral hemorrhage) ([place]) 04/30/2015  . Hypertensive emergency   . Hyperlipidemia   . Obesity   . AKI (acute kidney injury) ([place])   . Hypokalemia     Past Surgical History:  Procedure Laterality Date  . TUBAL LIGATION      OB History    No data available       Home Medications    Prior to Admission medications   Medication Sig Start Date End Date Taking? Authorizing Provider  albuterol (PROVENTIL HFA;VENTOLIN HFA) 108 (90 Base) MCG/ACT inhaler Inhale 1-2 puffs into the lungs every 6 (six) hours as needed for wheezing or shortness of breath (or persistent coughing). 10/30/15   Harrie Foreman, MD  atorvastatin (LIPITOR) 40 MG tablet  Take 1 tablet (40 mg total) by mouth daily at 6 PM. 05/06/15   Early Chars Rinehuls, PA-C  cloNIDine (CATAPRES) 0.3 MG tablet Take 1 tablet (0.3 mg total) by mouth 3 (three) times daily. 09/23/15   Rosalin Hawking, MD  esomeprazole (NEXIUM) 20 MG capsule Take 20 mg by mouth daily before breakfast.    Historical Provider, MD  hydrALAZINE (APRESOLINE) 50 MG tablet Take 1 tablet (50 mg total) by mouth 3 (three) times daily. 06/19/15   Rosalin Hawking, MD  lisinopril (PRINIVIL,ZESTRIL) 20 MG tablet Take 1 tablet (20 mg total) by mouth 2 (two) times daily. 05/06/15   David L Rinehuls, PA-C  meloxicam (MOBIC) 7.5 MG tablet Take 1 tablet (7.5 mg total) by mouth daily as needed for pain. 11/19/15   [name], PA-C  Multiple Vitamin (MULTIVITAMIN WITH MINERALS) TABS tablet Take 1 tablet by mouth daily.    Historical Provider, MD    Family History Family History  Problem Relation Age of Onset  . Heart attack Maternal Grandfather   . Diabetes Mother   . Hypertension Mother   . Cancer Father   . Heart disease Maternal Grandmother     Social History Social History  Substance Use Topics  . Smoking status: Never Smoker  . Smokeless tobacco: Never Used  . Alcohol use 0.0 oz/week  Comment: occaisonal     Allergies   Latex; Oxycodone; and Percocet [oxycodone-acetaminophen]   Review of Systems Review of Systems  Constitutional: Negative for chills and fever.  Cardiovascular: Positive for leg swelling.  Musculoskeletal: Negative for gait problem.  Skin: Negative for color change.  Neurological: Negative for weakness and numbness.  Hematological: Does not bruise/bleed easily.  Psychiatric/Behavioral: Negative for self-injury.     Physical Exam Updated Vital Signs BP (!) 172/111 (BP Location: Right Arm)   Pulse 85   Temp 98.1 F (36.7 C) (Oral)   Resp 18   LMP 10/30/2015   SpO2 95%   Physical Exam  Constitutional: She appears well-developed and well-nourished. No distress.  HENT:  Head:  Normocephalic and atraumatic.  Neck: Neck supple.  Pulmonary/Chest: Effort normal.  Musculoskeletal:       Legs: Spine nontender, no crepitus, or stepoffs.   Left leg without erythema, warmth.  Tenderness over proximal calf and superior to knee anteriorly.   Neurological: She is alert.  Skin: She is not diaphoretic.  Nursing note and vitals reviewed.    ED Treatments / Results  Labs (all labs ordered are listed, but only abnormal results are displayed) Labs Reviewed  I-STAT CHEM 8, ED - Abnormal; Notable for the following:       Result Value   Potassium 3.4 (*)    Chloride 100 (*)    Creatinine, Ser 1.40 (*)    Calcium, Ion 1.14 (*)    All other components within normal limits    EKG  EKG Interpretation None       Radiology No results found.  Procedures Procedures (including critical care time)  Medications Ordered in ED Medications - No data to display   Initial Impression / Assessment and Plan / ED Course  I have reviewed the triage vital signs and the nursing notes.  Pertinent labs & imaging results that were available during my care of the patient were reviewed by me and considered in my medical decision making (see chart for details).  Clinical Course  Comment By Time  Blood pressure rechecked by tech - 185/90s [name], PA-C 10/04 1916    Afebrile, nontoxic patient with several months of left leg pain.  Possibly due to chronic knee pain.  DVT ruled out with vascular US.  Pt also with known hypertension, pt does not check pressure at home.  Chem 8 added to check renal function, slightly worse than last test in 04/2015.  Pt advised close follow up with PCP, pt will call tomorrow.  Discussed the dangers of chronically elevated hypertension.   D/C home with compression stockings, mobic, close PCP follow up.   Discussed result, findings, treatment, and follow up  with patient.  Pt given return precautions.  Pt verbalizes understanding and agrees with plan.         Final Clinical Impressions(s) / ED Diagnoses   Final diagnoses:  Left leg pain  Essential hypertension  Renal insufficiency    New Prescriptions Discharge Medication List as of 11/19/2015  7:20 PM    START taking these medications   Details  meloxicam (MOBIC) 7.5 MG tablet Take 1 tablet (7.5 mg total) by mouth daily as needed for pain., Starting Wed 11/19/2015, Print         [place], PA-C 11/19/15 2050    Isla Pence, MD 11/19/15 2201

## 2016-01-16 ENCOUNTER — Encounter (HOSPITAL_COMMUNITY): Payer: Self-pay | Admitting: Emergency Medicine

## 2016-01-16 ENCOUNTER — Ambulatory Visit (HOSPITAL_COMMUNITY)
Admission: EM | Admit: 2016-01-16 | Discharge: 2016-01-16 | Disposition: A | Discharge status: Home / Self Care | Payer: PRIVATE HEALTH INSURANCE | Attending: Emergency Medicine | Admitting: Emergency Medicine

## 2016-01-16 DIAGNOSIS — R110 Nausea: Secondary | ICD-10-CM

## 2016-01-16 LAB — POCT PREGNANCY, URINE: Preg Test, Ur: NEGATIVE

## 2016-01-16 MED ORDER — ONDANSETRON 4 MG PO TBDP
4.0000 mg | ORAL_TABLET | Freq: Once | ORAL | Status: AC
  Administered 2016-01-16: 4 mg via ORAL

## 2016-01-16 MED ORDER — ONDANSETRON HCL 4 MG PO TABS: 4.0000 mg | ORAL_TABLET | Freq: Three times a day (TID) | ORAL | 0 refills | Status: DC | PRN

## 2016-01-16 MED ORDER — ONDANSETRON 4 MG PO TBDP
ORAL_TABLET | ORAL | Status: AC
  Filled 2016-01-16: qty 1

## 2016-01-16 NOTE — ED Triage Notes (Signed)
Pt c/o feeling dizzy and nauseas onset 3 weeks associated w/vomiting, abd pain  Denies fevers, chills, urinary sx  BP today = 190/77... Has not had BP meds today.   A&O x4.. NAD

## 2016-01-16 NOTE — ED Provider Notes (Signed)
[place]    CSN: [id number] Arrival date & time: 01/16/16  1723     History   Chief Complaint Chief Complaint  Patient presents with  . Dizziness    HPI Belinda Ramirez is a 40 y.o. female.   HPI She is a 40 year old woman here for evaluation of nausea and dizziness. She states for the last 3 weeks she has had near constant nausea. Sometimes it is associated with stomach cramping and vomiting. At times, she will have a headache and other times she will feel a little dizzy or lightheaded. She mostly feels lightheaded when she stands up quickly. No diarrhea. No fevers. 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 She had a tubal ligation 20 years ago, but states she did miss her November period. She denies any chest pain. She does report some shortness of breath, particularly when exerting herself.  Her blood pressure is quite elevated today, but she has not taken her blood pressure medicine since she hasn't been able to eat anything. She does monitor her blood pressure at home and states it is well controlled when she takes her medicine.  Past Medical History:  Diagnosis Date  . High cholesterol   . Hypertension   . Obesity   . Sickle cell trait ([place])   . Stroke [facility])     Patient Active Problem List   Diagnosis Date Noted  . OSA (obstructive sleep apnea) 06/19/2015  . Cytotoxic brain edema ([place]) 05/05/2015  . HTN (hypertension), malignant   . HLD (hyperlipidemia)   . Morbid obesity due to excess calories ([place])   . Intraparenchymal hematoma of brain ([place]) 04/30/2015  . ICH (intracerebral hemorrhage) ([place]) 04/30/2015  . Hypertensive emergency   . Hyperlipidemia   . Obesity   . AKI (acute kidney injury) ([place])   . Hypokalemia     Past Surgical History:  Procedure Laterality Date  . TUBAL LIGATION      OB History    No data available       Home Medications    Prior to Admission medications   Medication Sig Start Date End Date  Taking? Authorizing Provider  albuterol (PROVENTIL HFA;VENTOLIN HFA) 108 (90 Base) MCG/ACT inhaler Inhale 1-2 puffs into the lungs every 6 (six) hours as needed for wheezing or shortness of breath (or persistent coughing). 10/30/15  Yes Harrie Foreman, MD  atorvastatin (LIPITOR) 40 MG tablet Take 1 tablet (40 mg total) by mouth daily at 6 PM. 05/06/15  Yes David L Rinehuls, PA-C  cloNIDine (CATAPRES) 0.3 MG tablet Take 1 tablet (0.3 mg total) by mouth 3 (three) times daily. 09/23/15  Yes Rosalin Hawking, MD  esomeprazole (NEXIUM) 20 MG capsule Take 20 mg by mouth daily before breakfast.   Yes Historical Provider, MD  hydrALAZINE (APRESOLINE) 50 MG tablet Take 1 tablet (50 mg total) by mouth 3 (three) times daily. 06/19/15  Yes Rosalin Hawking, MD  lisinopril (PRINIVIL,ZESTRIL) 20 MG tablet Take 1 tablet (20 mg total) by mouth 2 (two) times daily. 05/06/15  Yes David L Rinehuls, PA-C  meloxicam (MOBIC) 7.5 MG tablet Take 1 tablet (7.5 mg total) by mouth daily as needed for pain. 11/19/15  Yes [name], PA-C  Multiple Vitamin (MULTIVITAMIN WITH MINERALS) TABS tablet Take 1 tablet by mouth daily.   Yes Historical Provider, MD  ondansetron (ZOFRAN) 4 MG tablet Take 1 tablet (4 mg total) by mouth every 8 (eight) hours as needed for nausea or vomiting. 01/16/16  Melony Overly, MD    Family History Family History  Problem Relation Age of Onset  . Heart attack Maternal Grandfather   . Diabetes Mother   . Hypertension Mother   . Cancer Father   . Heart disease Maternal Grandmother     Social History Social History  Substance Use Topics  . Smoking status: Never Smoker  . Smokeless tobacco: Never Used  . Alcohol use 0.0 oz/week     Comment: occaisonal     Allergies   Latex; Oxycodone; and Percocet [oxycodone-acetaminophen]   Review of Systems Review of Systems As in history of present illness  Physical Exam Triage Vital Signs ED Triage Vitals [01/16/16 1734]  Enc Vitals Group     BP 190/77      Pulse Rate 79     Resp 20     Temp 98.8 F (37.1 C)     Temp Source Oral     SpO2 100 %     Weight      Height      Head Circumference      Peak Flow      Pain Score      Pain Loc      Pain Edu?      Excl. in [place]?    Orthostatic VS for the past 24 hrs:  BP- Lying Pulse- Lying BP- Sitting Pulse- Sitting BP- Standing at 0 minutes Pulse- Standing at 0 minutes  01/16/16 1806 (!) 170/97 81 (!) 166/105 90 (!) 162/109 90    Updated Vital Signs BP 190/77 (BP Location: Left Arm)   Pulse 79   Temp 98.8 F (37.1 C) (Oral)   Resp 20   LMP 11/30/2015   SpO2 100%   Visual Acuity Right Eye Distance:   Left Eye Distance:   Bilateral Distance:    Right Eye Near:   Left Eye Near:    Bilateral Near:     Physical Exam  Constitutional: She is oriented to person, place, and time. She appears well-developed and well-nourished. No distress.  Neck: Neck supple.  Cardiovascular: Normal rate and normal heart sounds.   No murmur heard. Slightly irregular rhythm  Pulmonary/Chest: Effort normal and breath sounds normal. No respiratory distress. She has no wheezes. She has no rales.  Neurological: She is alert and oriented to person, place, and time.     UC Treatments / Results  Labs (all labs ordered are listed, but only abnormal results are displayed) Labs Reviewed  POCT PREGNANCY, URINE    EKG  EKG Interpretation None       Radiology No results found.  Procedures ED EKG Date/Time: 01/16/2016 6:22 PM Performed by: Melony Overly Authorized by: Melony Overly   ECG reviewed by ED Physician in the absence of a cardiologist: yes   Previous ECG:    Previous ECG:  Compared to current   Similarity:  No change Interpretation:    Interpretation: abnormal   Rate:    ECG rate:  75   ECG rate assessment: normal   Rhythm:    Rhythm: sinus rhythm   Ectopy:    Ectopy: none   QRS:    QRS axis:  Normal Conduction:    Conduction normal: incomplete LBBB.   ST segments:    ST  segments:  Normal T waves:    T waves: non-specific   Other findings:    Other findings: poor R wave progression   Comments:     NSR, poor R-wave progression. No  acute changes from previous.   (including critical care time)  Medications Ordered in UC Medications  ondansetron (ZOFRAN-ODT) disintegrating tablet 4 mg (4 mg Oral Given 01/16/16 1756)     Initial Impression / Assessment and Plan / UC Course  I have reviewed the triage vital signs and the nursing notes.  Pertinent labs & imaging results that were available during my care of the patient were reviewed by me and considered in my medical decision making (see chart for details).  Clinical Course     EKG is unchanged from previous. Patient reports improvement after the Zofran. We discussed the possibility of doing an i-STAT here to evaluate electrolytes and kidney function. Patient declines at this time. We'll treat symptomatically with Zofran and increased fluids. If not improving in 1 week, she is to follow-up with her PCP for blood work and additional evaluation.  Final Clinical Impressions(s) / UC Diagnoses   Final diagnoses:  Nausea    New Prescriptions New Prescriptions   ONDANSETRON (ZOFRAN) 4 MG TABLET    Take 1 tablet (4 mg total) by mouth every 8 (eight) hours as needed for nausea or vomiting.     Melony Overly, MD 01/16/16 [phone]

## 2016-01-16 NOTE — Discharge Instructions (Signed)
The testing we did today is normal. You may be experiencing low-level illness from your exposure to children. Use the Zofran every 8 hours as needed for nausea. Make sure you are drinking lots of fluids. If this is not improving in 1 week, please follow-up with your primary care doctor so they can do blood work and additional evaluation.

## 2016-03-22 ENCOUNTER — Encounter (HOSPITAL_COMMUNITY): Payer: Self-pay

## 2016-03-22 DIAGNOSIS — Z8673 Personal history of transient ischemic attack (TIA), and cerebral infarction without residual deficits: Secondary | ICD-10-CM | POA: Insufficient documentation

## 2016-03-22 DIAGNOSIS — H6691 Otitis media, unspecified, right ear: Secondary | ICD-10-CM | POA: Insufficient documentation

## 2016-03-22 DIAGNOSIS — H9201 Otalgia, right ear: Secondary | ICD-10-CM | POA: Diagnosis present

## 2016-03-22 DIAGNOSIS — I10 Essential (primary) hypertension: Secondary | ICD-10-CM | POA: Diagnosis not present

## 2016-03-22 NOTE — ED Triage Notes (Signed)
Pt states she just got over cold and believes she has an ear infection; pt states ear pain started 3 weeks ago; pt states some drainage noted; pt states pain at 8/10; pt hypertensive at triage at 235/119 but states hx of HTN with meds; Pt a&ox 4 on arrival.

## 2016-03-23 ENCOUNTER — Emergency Department (HOSPITAL_COMMUNITY)
Admission: EM | Admit: 2016-03-23 | Discharge: 2016-03-23 | Disposition: A | Discharge status: Home / Self Care | Payer: PRIVATE HEALTH INSURANCE | Attending: Emergency Medicine | Admitting: Emergency Medicine

## 2016-03-23 DIAGNOSIS — I10 Essential (primary) hypertension: Secondary | ICD-10-CM

## 2016-03-23 DIAGNOSIS — I161 Hypertensive emergency: Secondary | ICD-10-CM

## 2016-03-23 DIAGNOSIS — H6691 Otitis media, unspecified, right ear: Secondary | ICD-10-CM

## 2016-03-23 LAB — URINALYSIS, ROUTINE W REFLEX MICROSCOPIC
BILIRUBIN URINE: NEGATIVE
Glucose, UA: NEGATIVE mg/dL
HGB URINE DIPSTICK: NEGATIVE
Ketones, ur: NEGATIVE mg/dL
NITRITE: NEGATIVE
PH: 6 (ref 5.0–8.0)
Protein, ur: 100 mg/dL — AB
SPECIFIC GRAVITY, URINE: 1.01 (ref 1.005–1.030)

## 2016-03-23 LAB — BASIC METABOLIC PANEL
ANION GAP: 10 (ref 5–15)
BUN: 13 mg/dL (ref 6–20)
CALCIUM: 9.1 mg/dL (ref 8.9–10.3)
CO2: 27 mmol/L (ref 22–32)
Chloride: 101 mmol/L (ref 101–111)
Creatinine, Ser: 1.34 mg/dL — ABNORMAL HIGH (ref 0.44–1.00)
GFR calc non Af Amer: 48 mL/min — ABNORMAL LOW (ref >60)
GFR, EST AFRICAN AMERICAN: 56 mL/min — AB (ref >60)
Glucose, Bld: 120 mg/dL — ABNORMAL HIGH (ref 65–99)
Potassium: 3.1 mmol/L — ABNORMAL LOW (ref 3.5–5.1)
Sodium: 138 mmol/L (ref 135–145)

## 2016-03-23 LAB — CBC WITH DIFFERENTIAL/PLATELET
Basophils Absolute: 0 10*3/uL (ref 0.0–0.1)
Basophils Relative: 0 %
Eosinophils Absolute: 0.1 10*3/uL (ref 0.0–0.7)
Eosinophils Relative: 1 %
HCT: 35.1 % — ABNORMAL LOW (ref 36.0–46.0)
Hemoglobin: 11.4 g/dL — ABNORMAL LOW (ref 12.0–15.0)
Lymphocytes Relative: 31 %
Lymphs Abs: 2.5 10*3/uL (ref 0.7–4.0)
MCH: 22.7 pg — ABNORMAL LOW (ref 26.0–34.0)
MCHC: 32.5 g/dL (ref 30.0–36.0)
MCV: 69.8 fL — ABNORMAL LOW (ref 78.0–100.0)
Monocytes Absolute: 0.4 10*3/uL (ref 0.1–1.0)
Monocytes Relative: 5 %
Neutro Abs: 4.9 10*3/uL (ref 1.7–7.7)
Neutrophils Relative %: 63 %
Platelets: 328 10*3/uL (ref 150–400)
RBC: 5.03 MIL/uL (ref 3.87–5.11)
RDW: 19.1 % — ABNORMAL HIGH (ref 11.5–15.5)
WBC: 7.9 10*3/uL (ref 4.0–10.5)

## 2016-03-23 LAB — POC URINE PREG, ED: PREG TEST UR: NEGATIVE

## 2016-03-23 MED ORDER — POTASSIUM CHLORIDE CRYS ER 20 MEQ PO TBCR
40.0000 meq | EXTENDED_RELEASE_TABLET | Freq: Once | ORAL | Status: AC
  Administered 2016-03-23: 40 meq via ORAL
  Filled 2016-03-23: qty 2

## 2016-03-23 MED ORDER — CLONIDINE HCL 0.3 MG PO TABS: 0.3000 mg | ORAL_TABLET | Freq: Two times a day (BID) | ORAL | 0 refills | Status: DC

## 2016-03-23 MED ORDER — LISINOPRIL 20 MG PO TABS: 20.0000 mg | ORAL_TABLET | Freq: Two times a day (BID) | ORAL | 0 refills | Status: DC

## 2016-03-23 MED ORDER — CLONIDINE HCL 0.2 MG PO TABS
0.3000 mg | ORAL_TABLET | Freq: Once | ORAL | Status: AC
  Administered 2016-03-23: 0.3 mg via ORAL
  Filled 2016-03-23: qty 1

## 2016-03-23 MED ORDER — HYDROCODONE-ACETAMINOPHEN 5-325 MG PO TABS: 1.0000 | ORAL_TABLET | Freq: Four times a day (QID) | ORAL | 0 refills | Status: DC | PRN

## 2016-03-23 MED ORDER — AMOXICILLIN 500 MG PO CAPS: 500.0000 mg | ORAL_CAPSULE | Freq: Three times a day (TID) | ORAL | 0 refills | Status: DC

## 2016-03-23 MED ORDER — AMOXICILLIN 500 MG PO CAPS
500.0000 mg | ORAL_CAPSULE | Freq: Once | ORAL | Status: AC
  Administered 2016-03-23: 500 mg via ORAL
  Filled 2016-03-23: qty 1

## 2016-03-23 MED ORDER — DEXAMETHASONE 4 MG PO TABS
10.0000 mg | ORAL_TABLET | Freq: Once | ORAL | Status: AC
  Administered 2016-03-23: 10 mg via ORAL
  Filled 2016-03-23: qty 3

## 2016-03-23 MED ORDER — HYDRALAZINE HCL 50 MG PO TABS: 50.0000 mg | ORAL_TABLET | Freq: Three times a day (TID) | ORAL | 0 refills | Status: DC

## 2016-03-23 MED ORDER — IBUPROFEN 800 MG PO TABS
800.0000 mg | ORAL_TABLET | Freq: Once | ORAL | Status: AC
  Administered 2016-03-23: 800 mg via ORAL
  Filled 2016-03-23: qty 1

## 2016-03-23 MED ORDER — HYDRALAZINE HCL 50 MG PO TABS
50.0000 mg | ORAL_TABLET | Freq: Once | ORAL | Status: AC
  Administered 2016-03-23: 50 mg via ORAL
  Filled 2016-03-23: qty 1

## 2016-03-23 MED ORDER — LISINOPRIL 20 MG PO TABS
20.0000 mg | ORAL_TABLET | Freq: Once | ORAL | Status: AC
  Administered 2016-03-23: 20 mg via ORAL
  Filled 2016-03-23: qty 1

## 2016-03-23 NOTE — ED Provider Notes (Signed)
By signing my name below, I, Evelene Croon, attest that this documentation has been prepared under the direction and in the presence of [place], DO . Electronically Signed: Evelene Croon, Scribe. 03/23/2016. 3:53 AM.  TIME SEEN: 3:47 AM  CHIEF COMPLAINT:  Chief Complaint  Patient presents with  . Otalgia  . Hypertension     HPI:  HPI Comments:  Belinda Ramirez is a 41 y.o. female with a history of HTN and ICH, who presents to the Emergency Department complaining of 8/10 right ear pain that worsened this evening. Her pain originally began ~ 3 weeks ago as she was recovering from a cold. She reports draining sticky clear fluid from the ear and associated right sided facial/dental pain. No recent fever. Pt notes cough with occasional yellow sputum. She has an appointment with Dr. Erlinda Hong (neurology) scheduled for 03/24/2016;  believes her HTN meds will be changed during that visit. Pt states she has been compliant with her HTN meds (hydralazine, lisinopril) which she last had yesterday AM (03/22/2016). States she was taken off of clonidine but she thinks she needs to go back on it. She denies HA, acute vision changes, CP, SOB, and unilateral numbness/tingling/weakness. States it is normal for her blood pressure to be in the 200s/100s.  ROS: See HPI Constitutional: no fever  Eyes: no drainage  ENT: no runny nose   Cardiovascular:  no chest pain  Resp: no SOB  GI: no vomiting GU: no dysuria Integumentary: no rash  Allergy: no hives  Musculoskeletal: no leg swelling  Neurological: no slurred speech ROS otherwise negative  PAST MEDICAL HISTORY/PAST SURGICAL HISTORY:  Past Medical History:  Diagnosis Date  . High cholesterol   . Hypertension   . Obesity   . Sickle cell trait ([place])   . Stroke [facility])     MEDICATIONS:  Prior to Admission medications   Medication Sig Start Date End Date Taking? Authorizing Provider  albuterol (PROVENTIL HFA;VENTOLIN HFA) 108 (90 Base) MCG/ACT inhaler  Inhale 1-2 puffs into the lungs every 6 (six) hours as needed for wheezing or shortness of breath (or persistent coughing). 10/30/15   Harrie Foreman, MD  atorvastatin (LIPITOR) 40 MG tablet Take 1 tablet (40 mg total) by mouth daily at 6 PM. 05/06/15   Early Chars Rinehuls, PA-C  cloNIDine (CATAPRES) 0.3 MG tablet Take 1 tablet (0.3 mg total) by mouth 3 (three) times daily. 09/23/15   Rosalin Hawking, MD  esomeprazole (NEXIUM) 20 MG capsule Take 20 mg by mouth daily before breakfast.    Historical Provider, MD  hydrALAZINE (APRESOLINE) 50 MG tablet Take 1 tablet (50 mg total) by mouth 3 (three) times daily. 06/19/15   Rosalin Hawking, MD  lisinopril (PRINIVIL,ZESTRIL) 20 MG tablet Take 1 tablet (20 mg total) by mouth 2 (two) times daily. 05/06/15   David L Rinehuls, PA-C  meloxicam (MOBIC) 7.5 MG tablet Take 1 tablet (7.5 mg total) by mouth daily as needed for pain. 11/19/15   [name], PA-C  Multiple Vitamin (MULTIVITAMIN WITH MINERALS) TABS tablet Take 1 tablet by mouth daily.    Historical Provider, MD  ondansetron (ZOFRAN) 4 MG tablet Take 1 tablet (4 mg total) by mouth every 8 (eight) hours as needed for nausea or vomiting. 01/16/16   Melony Overly, MD    ALLERGIES:  Allergies  Allergen Reactions  . Latex Itching  . Oxycodone Hives  . Percocet [Oxycodone-Acetaminophen] Hives    SOCIAL HISTORY:  Social History  Substance Use Topics  . Smoking  status: Never Smoker  . Smokeless tobacco: Never Used  . Alcohol use 0.0 oz/week     Comment: occaisonal    FAMILY HISTORY: Family History  Problem Relation Age of Onset  . Heart attack Maternal Grandfather   . Diabetes Mother   . Hypertension Mother   . Cancer Father   . Heart disease Maternal Grandmother     EXAM: BP (!) 214/129 (BP Location: Left Arm)   Pulse 87   Temp 99.2 F (37.3 C) (Oral)   Resp 16   Ht 5\' 5"  (1.651 m)   Wt 260 lb (117.9 kg)   LMP 03/02/2016   SpO2 99%   BMI 43.27 kg/m  CONSTITUTIONAL: Alert and oriented and responds  appropriately to questions. Well-appearing; well-nourished HEAD: Normocephalic EYES: Conjunctivae clear, PERRL, EOMI ENT: normal nose; no rhinorrhea; moist mucous membranes. L TM is  clear bilaterally without erythema, purulence, bulging, perforation, effusion.  Right TM is bulging with associated middle ear effusion. No cerumen impaction or sign of foreign body in the external auditory canal. No inflammation, erythema or drainage from the external auditory canal. No signs of mastoiditis. No pain with manipulation of the pinna bilaterally.  No pharyngeal erythema or petechiae, no tonsillar hypertrophy or exudate, no uvular deviation, no unilateral swelling, no trismus or drooling, no muffled voice, normal phonation, no stridor, no dental pain with palpation or dental caries present, no drainable dental abscess noted, no Ludwig's angina, tongue sits flat in the bottom of the mouth, no angioedema, no facial erythema or warmth, no facial swelling; no pain with movement of the neck NECK: Supple, no meningismus, no nuchal rigidity, no LAD  CARD: RRR; S1 and S2 appreciated; no murmurs, no clicks, no rubs, no gallops RESP: Normal chest excursion without splinting or tachypnea; breath sounds clear and equal bilaterally; no wheezes, no rhonchi, no rales, no hypoxia or respiratory distress, speaking full sentences ABD/GI: Normal bowel sounds; non-distended; soft, non-tender, no rebound, no guarding, no peritoneal signs, no hepatosplenomegaly BACK:  The back appears normal and is non-tender to palpation, there is no CVA tenderness EXT: Normal ROM in all joints; non-tender to palpation; no edema; normal capillary refill; no cyanosis, no calf tenderness or swelling    SKIN: Normal color for age and race; warm; no rash NEURO: Moves all extremities equally, sensation to light touch intact diffusely, cranial nerves II through XII intact, normal speech PSYCH: The patient's mood and manner are appropriate. Grooming and  personal hygiene are appropriate.  MEDICAL DECISION MAKING: Patient here for complaints of right ear pain. Does appear to have otitis media with effusion. No signs of mastoiditis. Will discharge on amoxicillin and a small prescription of pain medication. She is however very hypertensive here and I feel is likely not compliant with her medications that she should be. Has not had blood pressure medications in almost 24 hours. We'll give her her home dose of clonidine, hydralazine, lisinopril. Will obtain labs, EKG and urine to evaluate for signs of end organ damage. She is currently denying any headache, vision changes, neurologic deficits, chest pain or shortness of breath.  ED PROGRESS: Patient's labs show mild elevation of recurrent which appears chronic. Potassium mildly low at 3.1 which we will replace. Urine shows proteinuria which is chronic without hematuria. Pregnancy test negative. Patient reports feeling "much better" and her blood pressure has significantly improved. She does have an outpatient primary care physician for follow-up and I have recommended she schedule an appointment for close monitoring and management of her  blood pressure. Discussed with her that it is not safe for her to keep a blood pressure of 200s/100s if she has had a history of intracranial hemorrhage in the past. Doubt intracranial hemorrhage today she has no headache, neurologic deficits. Discussed at length return precautions. We'll discharge with amoxicillin for her otitis media. She is comfortable with this plan.   At this time, I do not feel there is any life-threatening condition present. I have reviewed and discussed all results (EKG, imaging, lab, urine as appropriate) and exam findings with patient/family. I have reviewed nursing notes and appropriate previous records.  I feel the patient is safe to be discharged home without further emergent workup and can continue workup as an outpatient as needed. Discussed usual  and customary return precautions. Patient/family verbalize understanding and are comfortable with this plan.  Outpatient follow-up has been provided. All questions have been answered.      EKG Interpretation  Date/Time:  Tuesday March 23 2016 05:10:01 EST Ventricular Rate:  76 PR Interval:    QRS Duration: 117 QT Interval:  453 QTC Calculation: 510 R Axis:   43 Text Interpretation:  Sinus rhythm Borderline prolonged PR interval Probable left atrial enlargement Nonspecific intraventricular conduction delay Abnormal T, consider ischemia, lateral leads Minimal ST elevation, inferior leads No significant change since last tracing Confirmed by Finley Dinkel,  DO, Tyreshia Ingman [phone]) on 03/23/2016 5:22:16 AM        I personally performed the services described in this documentation, which was scribed in my presence. The recorded information has been reviewed and is accurate.     [place], DO 03/23/16 [phone]

## 2016-03-23 NOTE — ED Notes (Signed)
Pt states she understands instructions and will follow up. Pt home stable with steady gait.

## 2016-03-24 ENCOUNTER — Ambulatory Visit: Payer: PRIVATE HEALTH INSURANCE | Admitting: Neurology

## 2016-03-29 ENCOUNTER — Encounter: Payer: Self-pay | Admitting: Neurology

## 2016-04-12 ENCOUNTER — Ambulatory Visit (HOSPITAL_COMMUNITY)
Admission: EM | Admit: 2016-04-12 | Discharge: 2016-04-12 | Disposition: A | Discharge status: Home / Self Care | Payer: PRIVATE HEALTH INSURANCE | Source: Home / Self Care

## 2016-04-12 ENCOUNTER — Encounter (HOSPITAL_COMMUNITY): Payer: Self-pay | Admitting: Emergency Medicine

## 2016-04-12 ENCOUNTER — Inpatient Hospital Stay (HOSPITAL_COMMUNITY)
Admission: EM | Admit: 2016-04-12 | Discharge: 2016-04-15 | DRG: 305 | Disposition: A | Discharge status: Home / Self Care | Payer: PRIVATE HEALTH INSURANCE | Attending: Internal Medicine | Admitting: Internal Medicine

## 2016-04-12 ENCOUNTER — Emergency Department (HOSPITAL_COMMUNITY): Payer: PRIVATE HEALTH INSURANCE

## 2016-04-12 ENCOUNTER — Inpatient Hospital Stay (HOSPITAL_COMMUNITY): Payer: PRIVATE HEALTH INSURANCE

## 2016-04-12 DIAGNOSIS — R402362 Coma scale, best motor response, obeys commands, at arrival to emergency department: Secondary | ICD-10-CM | POA: Diagnosis present

## 2016-04-12 DIAGNOSIS — D573 Sickle-cell trait: Secondary | ICD-10-CM | POA: Diagnosis present

## 2016-04-12 DIAGNOSIS — I161 Hypertensive emergency: Secondary | ICD-10-CM | POA: Diagnosis present

## 2016-04-12 DIAGNOSIS — R0602 Shortness of breath: Secondary | ICD-10-CM | POA: Diagnosis not present

## 2016-04-12 DIAGNOSIS — I5032 Chronic diastolic (congestive) heart failure: Secondary | ICD-10-CM | POA: Diagnosis present

## 2016-04-12 DIAGNOSIS — E669 Obesity, unspecified: Secondary | ICD-10-CM

## 2016-04-12 DIAGNOSIS — Z91040 Latex allergy status: Secondary | ICD-10-CM | POA: Diagnosis not present

## 2016-04-12 DIAGNOSIS — I639 Cerebral infarction, unspecified: Secondary | ICD-10-CM

## 2016-04-12 DIAGNOSIS — R0902 Hypoxemia: Secondary | ICD-10-CM | POA: Diagnosis present

## 2016-04-12 DIAGNOSIS — H53419 Scotoma involving central area, unspecified eye: Secondary | ICD-10-CM | POA: Diagnosis present

## 2016-04-12 DIAGNOSIS — E7800 Pure hypercholesterolemia, unspecified: Secondary | ICD-10-CM | POA: Diagnosis present

## 2016-04-12 DIAGNOSIS — I129 Hypertensive chronic kidney disease with stage 1 through stage 4 chronic kidney disease, or unspecified chronic kidney disease: Secondary | ICD-10-CM | POA: Diagnosis not present

## 2016-04-12 DIAGNOSIS — R0789 Other chest pain: Secondary | ICD-10-CM | POA: Diagnosis not present

## 2016-04-12 DIAGNOSIS — N189 Chronic kidney disease, unspecified: Secondary | ICD-10-CM | POA: Diagnosis not present

## 2016-04-12 DIAGNOSIS — R32 Unspecified urinary incontinence: Secondary | ICD-10-CM | POA: Diagnosis present

## 2016-04-12 DIAGNOSIS — Z8679 Personal history of other diseases of the circulatory system: Secondary | ICD-10-CM | POA: Diagnosis not present

## 2016-04-12 DIAGNOSIS — I69154 Hemiplegia and hemiparesis following nontraumatic intracerebral hemorrhage affecting left non-dominant side: Secondary | ICD-10-CM | POA: Diagnosis not present

## 2016-04-12 DIAGNOSIS — R079 Chest pain, unspecified: Secondary | ICD-10-CM

## 2016-04-12 DIAGNOSIS — I6330 Cerebral infarction due to thrombosis of unspecified cerebral artery: Secondary | ICD-10-CM | POA: Insufficient documentation

## 2016-04-12 DIAGNOSIS — R531 Weakness: Secondary | ICD-10-CM | POA: Diagnosis not present

## 2016-04-12 DIAGNOSIS — R402142 Coma scale, eyes open, spontaneous, at arrival to emergency department: Secondary | ICD-10-CM | POA: Diagnosis present

## 2016-04-12 DIAGNOSIS — Z8249 Family history of ischemic heart disease and other diseases of the circulatory system: Secondary | ICD-10-CM

## 2016-04-12 DIAGNOSIS — N183 Chronic kidney disease, stage 3 (moderate): Secondary | ICD-10-CM | POA: Diagnosis present

## 2016-04-12 DIAGNOSIS — Z833 Family history of diabetes mellitus: Secondary | ICD-10-CM | POA: Diagnosis not present

## 2016-04-12 DIAGNOSIS — D509 Iron deficiency anemia, unspecified: Secondary | ICD-10-CM | POA: Diagnosis present

## 2016-04-12 DIAGNOSIS — R001 Bradycardia, unspecified: Secondary | ICD-10-CM | POA: Diagnosis present

## 2016-04-12 DIAGNOSIS — I10 Essential (primary) hypertension: Secondary | ICD-10-CM | POA: Diagnosis not present

## 2016-04-12 DIAGNOSIS — I130 Hypertensive heart and chronic kidney disease with heart failure and stage 1 through stage 4 chronic kidney disease, or unspecified chronic kidney disease: Secondary | ICD-10-CM | POA: Diagnosis present

## 2016-04-12 DIAGNOSIS — H5461 Unqualified visual loss, right eye, normal vision left eye: Secondary | ICD-10-CM | POA: Diagnosis not present

## 2016-04-12 DIAGNOSIS — R402252 Coma scale, best verbal response, oriented, at arrival to emergency department: Secondary | ICD-10-CM | POA: Diagnosis present

## 2016-04-12 DIAGNOSIS — I69354 Hemiplegia and hemiparesis following cerebral infarction affecting left non-dominant side: Secondary | ICD-10-CM | POA: Diagnosis not present

## 2016-04-12 DIAGNOSIS — Z9119 Patient's noncompliance with other medical treatment and regimen: Secondary | ICD-10-CM

## 2016-04-12 DIAGNOSIS — E559 Vitamin D deficiency, unspecified: Secondary | ICD-10-CM | POA: Diagnosis present

## 2016-04-12 DIAGNOSIS — H53131 Sudden visual loss, right eye: Secondary | ICD-10-CM | POA: Diagnosis not present

## 2016-04-12 DIAGNOSIS — I69398 Other sequelae of cerebral infarction: Secondary | ICD-10-CM | POA: Diagnosis not present

## 2016-04-12 DIAGNOSIS — G4730 Sleep apnea, unspecified: Secondary | ICD-10-CM | POA: Diagnosis not present

## 2016-04-12 DIAGNOSIS — Z809 Family history of malignant neoplasm, unspecified: Secondary | ICD-10-CM | POA: Diagnosis not present

## 2016-04-12 DIAGNOSIS — Z6841 Body Mass Index (BMI) 40.0 and over, adult: Secondary | ICD-10-CM

## 2016-04-12 DIAGNOSIS — R778 Other specified abnormalities of plasma proteins: Secondary | ICD-10-CM

## 2016-04-12 DIAGNOSIS — E662 Morbid (severe) obesity with alveolar hypoventilation: Secondary | ICD-10-CM | POA: Diagnosis present

## 2016-04-12 DIAGNOSIS — E785 Hyperlipidemia, unspecified: Secondary | ICD-10-CM | POA: Diagnosis present

## 2016-04-12 DIAGNOSIS — N289 Disorder of kidney and ureter, unspecified: Secondary | ICD-10-CM | POA: Diagnosis not present

## 2016-04-12 DIAGNOSIS — Z885 Allergy status to narcotic agent status: Secondary | ICD-10-CM

## 2016-04-12 DIAGNOSIS — R9431 Abnormal electrocardiogram [ECG] [EKG]: Secondary | ICD-10-CM | POA: Diagnosis not present

## 2016-04-12 DIAGNOSIS — R7989 Other specified abnormal findings of blood chemistry: Secondary | ICD-10-CM

## 2016-04-12 DIAGNOSIS — Z9114 Patient's other noncompliance with medication regimen: Secondary | ICD-10-CM

## 2016-04-12 DIAGNOSIS — Z79899 Other long term (current) drug therapy: Secondary | ICD-10-CM | POA: Diagnosis not present

## 2016-04-12 DIAGNOSIS — H547 Unspecified visual loss: Secondary | ICD-10-CM | POA: Diagnosis present

## 2016-04-12 DIAGNOSIS — I160 Hypertensive urgency: Secondary | ICD-10-CM | POA: Diagnosis present

## 2016-04-12 DIAGNOSIS — Z8673 Personal history of transient ischemic attack (TIA), and cerebral infarction without residual deficits: Secondary | ICD-10-CM | POA: Diagnosis not present

## 2016-04-12 LAB — DIFFERENTIAL
BASOS ABS: 0 10*3/uL (ref 0.0–0.1)
BASOS PCT: 0 %
EOS ABS: 0.1 10*3/uL (ref 0.0–0.7)
EOS PCT: 1 %
LYMPHS ABS: 2 10*3/uL (ref 0.7–4.0)
Lymphocytes Relative: 26 %
Monocytes Absolute: 0.4 10*3/uL (ref 0.1–1.0)
Monocytes Relative: 5 %
NEUTROS PCT: 68 %
Neutro Abs: 5.3 10*3/uL (ref 1.7–7.7)

## 2016-04-12 LAB — URINALYSIS, ROUTINE W REFLEX MICROSCOPIC
Bacteria, UA: NONE SEEN
Bilirubin Urine: NEGATIVE
Glucose, UA: NEGATIVE mg/dL
Ketones, ur: NEGATIVE mg/dL
Leukocytes, UA: NEGATIVE
Nitrite: NEGATIVE
Protein, ur: 100 mg/dL — AB
Specific Gravity, Urine: 1.009 (ref 1.005–1.030)
pH: 7 (ref 5.0–8.0)

## 2016-04-12 LAB — COMPREHENSIVE METABOLIC PANEL
ALBUMIN: 3.7 g/dL (ref 3.5–5.0)
ALT: 18 U/L (ref 14–54)
ANION GAP: 9 (ref 5–15)
AST: 18 U/L (ref 15–41)
Alkaline Phosphatase: 81 U/L (ref 38–126)
BILIRUBIN TOTAL: 0.7 mg/dL (ref 0.3–1.2)
BUN: 11 mg/dL (ref 6–20)
CO2: 26 mmol/L (ref 22–32)
Calcium: 8.9 mg/dL (ref 8.9–10.3)
Chloride: 105 mmol/L (ref 101–111)
Creatinine, Ser: 1.33 mg/dL — ABNORMAL HIGH (ref 0.44–1.00)
GFR calc Af Amer: 56 mL/min — ABNORMAL LOW (ref >60)
GFR calc non Af Amer: 49 mL/min — ABNORMAL LOW (ref >60)
GLUCOSE: 97 mg/dL (ref 65–99)
POTASSIUM: 3.6 mmol/L (ref 3.5–5.1)
Sodium: 140 mmol/L (ref 135–145)
TOTAL PROTEIN: 8.2 g/dL — AB (ref 6.5–8.1)

## 2016-04-12 LAB — CBC
HCT: 38.7 % (ref 36.0–46.0)
Hemoglobin: 12.7 g/dL (ref 12.0–15.0)
MCH: 23.5 pg — AB (ref 26.0–34.0)
MCHC: 32.8 g/dL (ref 30.0–36.0)
MCV: 71.5 fL — ABNORMAL LOW (ref 78.0–100.0)
PLATELETS: 322 10*3/uL (ref 150–400)
RBC: 5.41 MIL/uL — ABNORMAL HIGH (ref 3.87–5.11)
RDW: 19.6 % — AB (ref 11.5–15.5)
WBC: 7.8 10*3/uL (ref 4.0–10.5)

## 2016-04-12 LAB — I-STAT CG4 LACTIC ACID, ED: LACTIC ACID, VENOUS: 1.36 mmol/L (ref 0.5–1.9)

## 2016-04-12 LAB — TSH: TSH: 1.733 u[IU]/mL (ref 0.350–4.500)

## 2016-04-12 LAB — TROPONIN I: Troponin I: 0.03 ng/mL

## 2016-04-12 LAB — POC URINE PREG, ED: Preg Test, Ur: NEGATIVE

## 2016-04-12 LAB — PROTIME-INR
INR: 1.1
Prothrombin Time: 14.2 seconds (ref 11.4–15.2)

## 2016-04-12 LAB — BRAIN NATRIURETIC PEPTIDE: B Natriuretic Peptide: 142.3 pg/mL — ABNORMAL HIGH (ref 0.0–100.0)

## 2016-04-12 MED ORDER — SENNOSIDES-DOCUSATE SODIUM 8.6-50 MG PO TABS
1.0000 | ORAL_TABLET | Freq: Every day | ORAL | Status: DC
  Administered 2016-04-13 – 2016-04-14 (×2): 1 via ORAL
  Filled 2016-04-12 (×3): qty 1

## 2016-04-12 MED ORDER — PROMETHAZINE HCL 25 MG PO TABS: 12.5000 mg | ORAL_TABLET | Freq: Four times a day (QID) | ORAL | Status: DC | PRN

## 2016-04-12 MED ORDER — HEPARIN SODIUM (PORCINE) 5000 UNIT/ML IJ SOLN
5000.0000 [IU] | Freq: Three times a day (TID) | INTRAMUSCULAR | Status: DC
  Administered 2016-04-12 – 2016-04-15 (×7): 5000 [IU] via SUBCUTANEOUS
  Filled 2016-04-12 (×7): qty 1

## 2016-04-12 MED ORDER — LABETALOL HCL 5 MG/ML IV SOLN
10.0000 mg | Freq: Once | INTRAVENOUS | Status: AC
  Administered 2016-04-12: 10 mg via INTRAVENOUS
  Filled 2016-04-12: qty 4

## 2016-04-12 MED ORDER — SODIUM CHLORIDE 0.9% FLUSH
3.0000 mL | Freq: Two times a day (BID) | INTRAVENOUS | Status: DC
  Administered 2016-04-12 – 2016-04-15 (×6): 3 mL via INTRAVENOUS

## 2016-04-12 MED ORDER — CLONIDINE HCL 0.1 MG PO TABS
0.1000 mg | ORAL_TABLET | Freq: Two times a day (BID) | ORAL | Status: DC
  Administered 2016-04-12: 0.1 mg via ORAL
  Filled 2016-04-12 (×2): qty 1

## 2016-04-12 MED ORDER — GADOBENATE DIMEGLUMINE 529 MG/ML IV SOLN
20.0000 mL | Freq: Once | INTRAVENOUS | Status: AC | PRN
  Administered 2016-04-12: 20 mL via INTRAVENOUS

## 2016-04-12 MED ORDER — ACETAMINOPHEN 650 MG RE SUPP: 650.0000 mg | Freq: Four times a day (QID) | RECTAL | Status: DC | PRN

## 2016-04-12 MED ORDER — ACETAMINOPHEN 325 MG PO TABS
650.0000 mg | ORAL_TABLET | Freq: Four times a day (QID) | ORAL | Status: DC | PRN
  Administered 2016-04-14: 650 mg via ORAL
  Filled 2016-04-12: qty 2

## 2016-04-12 NOTE — ED Triage Notes (Signed)
Pt reports loss of vision in right eye that began 2 weeks ago, pt denies any other symptoms or injuries. Pt a/ox4, resp e/u, nad.

## 2016-04-12 NOTE — ED Notes (Signed)
Pt ambulated to the bathroom.  

## 2016-04-12 NOTE — ED Notes (Signed)
Patient transported to CT 

## 2016-04-12 NOTE — ED Notes (Signed)
Attempted report x1. 

## 2016-04-12 NOTE — ED Notes (Signed)
Pt returned from Xr, Dr. Cheral Marker to speak to pt before transport upstairs.

## 2016-04-12 NOTE — Consult Note (Signed)
NEURO HOSPITALIST CONSULT NOTE   Requestig physician: Dr. Beryle Beams  Reason for Consult: Monocular vision loss OD  History obtained from:  Patient and Chart    HPI:                                                                                                                                          Belinda Ramirez is an 41 y.o. female who presents with severe hypertension and right eye vision loss. Vision first became impaired about 2 weeks ago. She describes it as a patch of lost eyesight involving her central vision in the right eye, sparing the periphery. Recently was treated for right sided otitis media with effusion. She states that she is poorly compliant with her antihypertensive medications, taking them only at night as they make her "drowsy" if taken during the day. She has a home BP cuff but takes her BP reading only on Sundays.   MRI obtained this evening reveals a subacute punctate focus of restricted diffusion in the periventricular white matter of the left parietal lobe. This finding appears most likely to be secondary to small vessel occlusion secondary to vasospasm in the setting of malignant hypertension.   Past Medical History:  Diagnosis Date  . High cholesterol   . Hypertension   . Obesity   . Sickle cell trait ([place])   . Stroke [facility])     Past Surgical History:  Procedure Laterality Date  . TUBAL LIGATION      Family History  Problem Relation Age of Onset  . Heart attack Maternal Grandfather   . Diabetes Mother   . Hypertension Mother   . Cancer Father   . Heart disease Maternal Grandmother    Social History:  reports that she has never smoked. She has never used smokeless tobacco. She reports that she drinks alcohol. She reports that she does not use drugs.  Allergies  Allergen Reactions  . Oxycodone Hives  . Percocet [Oxycodone-Acetaminophen] Hives  . Latex Itching    MEDICATIONS:                                                                                                                      No current facility-administered medications on file prior to encounter.    Current Outpatient  Prescriptions on File Prior to Encounter  Medication Sig Dispense Refill  . amoxicillin (AMOXIL) 500 MG capsule Take 1 capsule (500 mg total) by mouth 3 (three) times daily. 21 capsule 0  . atorvastatin (LIPITOR) 40 MG tablet Take 1 tablet (40 mg total) by mouth daily at 6 PM. 30 tablet 3  . cloNIDine (CATAPRES) 0.3 MG tablet Take 1 tablet (0.3 mg total) by mouth 2 (two) times daily. 60 tablet 0  . hydrALAZINE (APRESOLINE) 50 MG tablet Take 1 tablet (50 mg total) by mouth 3 (three) times daily. 90 tablet 0  . lisinopril (PRINIVIL,ZESTRIL) 20 MG tablet Take 1 tablet (20 mg total) by mouth 2 (two) times daily. 60 tablet 0  . Multiple Vitamin (MULTIVITAMIN WITH MINERALS) TABS tablet Take 1 tablet by mouth daily.      ROS:                                                                                                                                       History obtained from patient. Denies headache. Has occasional episodes of exertional SOB, sometimes accompanied by acute urinary incontinence.   Blood pressure 184/99, pulse 69, temperature 98.2 F (36.8 C), resp. rate 23, height 5\' 5"  (1.651 m), weight 117.9 kg (260 lb), last menstrual period 04/12/2016, SpO2 99 %.   General Examination:                                                                                                      HEENT-  Normocephalic/atraumatic.  Integument: Morbidly obese.  Lungs- Respirations unlabored. No gross wheezing.  Extremities- No edema.   Neurological Examination Mental Status: Alert, oriented, thought content appropriate.  Speech fluent without evidence of aphasia.  Able to follow all commands without difficulty. Cranial Nerves: II: Peripheral visual fields intact all 4 quadrants OU. Has a central scotoma OD. PERRL. III,IV, VI: ptosis not  present, EOMI without nystagmus V,VII: smile symmetric, facial temperature sensation normal bilaterally VIII: hearing intact to conversation IX,X: Unable to visualize palate XI: bilateral shoulder shrug is symmetric XII: midline tongue extension Motor: Right : Upper extremity   5/5  Left:     Upper extremity   4+/5 deltoid, grip and biceps  Lower extremity   5/5   Lower extremity  4+ /5 hip flexion, ankle dorsiflexion Normal tone throughout; no atrophy noted. Subtle left pronator drift noted.  Sensory: Temperature and light touch intact x 4, without extinction.  Deep Tendon Reflexes: 2+ RUE and RLE. 3+  LUE and LLE Plantars: Right: equivocal   Left: downgoing Cerebellar: Normal finger-to-nose bilaterally.  Gait: Deferred  Lab Results: Basic Metabolic Panel:  Recent Labs Lab 04/12/16 1502  NA 140  K 3.6  CL 105  CO2 26  GLUCOSE 97  BUN 11  CREATININE 1.33*  CALCIUM 8.9    Liver Function Tests:  Recent Labs Lab 04/12/16 1502  AST 18  ALT 18  ALKPHOS 81  BILITOT 0.7  PROT 8.2*  ALBUMIN 3.7   No results for input(s): LIPASE, AMYLASE in the last 168 hours. No results for input(s): AMMONIA in the last 168 hours.  CBC:  Recent Labs Lab 04/12/16 1502  WBC 7.8  NEUTROABS 5.3  HGB 12.7  HCT 38.7  MCV 71.5*  PLT 322    Cardiac Enzymes:  Recent Labs Lab 04/12/16 1626  TROPONINI 0.03*    Lipid Panel: No results for input(s): CHOL, TRIG, HDL, CHOLHDL, VLDL, LDLCALC in the last 168 hours.  CBG: No results for input(s): GLUCAP in the last 168 hours.  Microbiology: Results for orders placed or performed during the hospital encounter of 04/30/15  MRSA PCR Screening     Status: None   Collection Time: 04/30/15  7:50 PM  Result Value Ref Range Status   MRSA by PCR NEGATIVE NEGATIVE Final    Comment:        The GeneXpert MRSA Assay (FDA approved for NASAL specimens only), is one component of a comprehensive MRSA colonization surveillance program. It is  not intended to diagnose MRSA infection nor to guide or monitor treatment for MRSA infections.     Coagulation Studies:  Recent Labs  04/12/16 1626  LABPROT 14.2  INR 1.10    Imaging: Dg Chest 2 View  Result Date: 04/12/2016 CLINICAL DATA:  Central chest pain, shortness of Breath with exertion EXAM: CHEST  2 VIEW COMPARISON:  10/23/2012 FINDINGS: Cardiomegaly is noted. Stable streaky atelectasis or scarring in right middle lobe and right perihilar. No infiltrate or pleural effusion. No pulmonary edema. Mild degenerative changes lower thoracic spine IMPRESSION: No active cardiopulmonary disease. Stable scarring in right middle lobe and right perihilar. Electronically Signed   By: Lahoma Crocker M.D.   On: 04/12/2016 16:59   Ct Head Wo Contrast  Result Date: 04/12/2016 CLINICAL DATA:  Visual changes, chest pain, shortness of breath EXAM: CT HEAD WITHOUT CONTRAST TECHNIQUE: Contiguous axial images were obtained from the base of the skull through the vertex without intravenous contrast. COMPARISON:  05/03/2015 FINDINGS: Brain: No evidence of acute infarction, hemorrhage, hydrocephalus, extra-axial collection or mass lesion/mass effect. Periventricular white matter low attenuation likely secondary to microvascular disease. Vascular: No hyperdense vessel or unexpected calcification. Skull: No osseous abnormality. Sinuses/Orbits: Visualized paranasal sinuses are clear. Visualized mastoid sinuses are clear. Visualized orbits demonstrate no focal abnormality. Other: None IMPRESSION: No acute intracranial pathology. Electronically Signed   By: Kathreen Devoid   On: 04/12/2016 17:39    Assessment: 1. Monocular vision loss OD. Exam reveals a central scotoma. DDx includes small retinal hemorrhage and branch retinal artery occlusion.  2. MRI obtained this evening reveals a subacute punctate focus of restricted diffusion in the periventricular white matter of the left parietal lobe. This finding appears most  likely to be secondary to small vessel occlusion secondary to vasospasm in the setting of malignant hypertension.   Recommendations: 1. Ophthalmology consultation for dilated retinal exam.  2. BP management. Not a permissive hypertension candidate given that the most likely mechanism for the punctate subacute infarction is  vasospasm secondary to malignant hypertension.  3. Education regarding medication compliance.  4. Advised patient to start taking BP measurements 3x daily and keep a record to assist her PCP in optimizing her antihypertensive regimen.   Electronically signed: Dr. Kerney Elbe 04/12/2016, 7:32 PM

## 2016-04-12 NOTE — H&P (Signed)
Date: 04/12/2016               Patient Name:  Belinda Ramirez MRN: [id number]  DOB: 1975/09/05 Age / Sex: 41 y.o., female   PCP: Elisabeth Cara, PA-C         Medical Service: Internal Medicine Teaching Service         Attending Physician: Dr. Annia Belt, MD    First Contact: Dr. Velna Ochs Pager: [phone]  Second Contact: Dr. Maryellen Pile Pager: [phone]       After Hours (After 5p /  First Contact Pager: [phone]  Weekends / Holidays): Second Contact Pager: [phone]   Chief Complaint: Right eye vision loss  History of Present Illness: Ms. Belinda Ramirez is a 41 y.o. 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  Pt reports that her symptoms begain suddenly two weeks ago. She has acute right eye vision loss upon waking. No pain. No drainage or redness or swelling. She can distinguish shapes but note that "everything looks dark." She describes a central area of visual dropout, but is able to distinguish peripheral shapes. These symptoms have been constant since onset, not worsening or improving. Left eye wnl.  She has a h/o hemorrhagic stroke in March 2017 resulting in mild residual LUE and LLE weakness. She has a h/o poorly controlled HTN and reports that this has been a longstanding issue for her. She reports taking her medications regularly, but notes that she takes them at night because the "make me feel bad." She also have known LVH on echo (2017) and EKG.  Pt also notes a subacute h/o of exertional dyspnea and CP which has been ongoing for several months since her last stroke 1 year ago. She is unable to walk more that 100 yards w/o SOB. Occasionally with exertion, she gets sharp substernal, non-radiating CP which lasts several minutes and seems to be helped by chest massage. She does not experience CP or SOB at rest. These symptoms have been relatively  constant without worsening recently. Last echo was in 04/2015 with G2DD and normal EF.  Pt denies any HA, N/V/D, fevers/chills, or other focal neuro deficits. She has mild residual LLE weakness from prior CVA. She also notes recent ear infection for which she received Augmentin, now finished. She also endorses a mild chronic cough, denies smoking hx or sick contacts.  During her last ED visit on 03/23/2016, pt reports that her lisinopril was d/c'd and she was started on TID hydralazine in addition to her BID clonidine. She reports that she has only been taking her hydralazine once daily at night, but does endorse compliance with her BID Clonidine, though she did not take any doses of her medications today prior to presentation.  In the ED, pt was found to has significant HTN up to 230s/120s. Pts initial lab evaluation was unremarkable for acute changes. She has CKD at baseline SCr of 1.3. BNP was minimally elevated at 142 and initial trop 0.03. UA was positive for hemoglobin and protein. Head CT was negative for acute stroke/bleed. Neurology was consulted.  Meds: Current Outpatient Prescriptions  Medication Sig Dispense Refill  . atorvastatin (LIPITOR) 40 MG tablet Take 1 tablet (40 mg total) by mouth daily at 6 PM. 30 tablet 3  . cloNIDine (CATAPRES) 0.3 MG tablet Take 1 tablet (0.3 mg total) by mouth 2 (two) times daily. [place]  tablet 0  . hydrALAZINE (APRESOLINE) 50 MG tablet Take 1 tablet (50 mg total) by mouth 3 (three) times daily. 90 tablet 0  . ibuprofen (ADVIL,MOTRIN) 200 MG tablet Take 200 mg by mouth every 6 (six) hours as needed for headache or moderate pain.    . Multiple Vitamin (MULTIVITAMIN WITH MINERALS) TABS tablet Take 1 tablet by mouth daily.     Allergies: Allergies as of 04/12/2016 - Review Complete 04/12/2016  Allergen Reaction Noted  . Oxycodone Hives 05/01/2013  . Percocet [oxycodone-acetaminophen] Hives 07/16/2013  . Latex Itching 07/16/2013   Past Medical History:    Diagnosis Date  . High cholesterol   . Hypertension   . Obesity   . Sickle cell trait ([place])   . Stroke [facility])    Family History: Pt family history includes Cancer in her father; Diabetes in her mother; Heart attack in her maternal grandfather; Heart disease in her maternal grandmother; Hypertension in her mother.  Social History: Pt  reports that she has never smoked. She has never used smokeless tobacco. She reports that she drinks alcohol. She reports that she does not use drugs.  Review of Systems: A complete ROS was negative except as per HPI. Review of Systems  Constitutional: Negative for chills, diaphoresis, fever and weight loss.  HENT: Positive for ear pain (20d ago).   Eyes: Positive for blurred vision (OD central scotoma). Negative for double vision, photophobia, pain and discharge.  Respiratory: Positive for shortness of breath (DOE). Negative for cough.   Cardiovascular: Positive for chest pain (exertional). Negative for leg swelling.  Gastrointestinal: Negative for abdominal pain, constipation, diarrhea, nausea and vomiting.  Genitourinary: Negative for dysuria, frequency and urgency.       Incontinence w/ exertion  Musculoskeletal: Negative for myalgias.  Skin: Negative for rash.  Neurological: Negative for dizziness, tremors and headaches.  Endo/Heme/Allergies: Negative for polydipsia.  Psychiatric/Behavioral: The patient is not nervous/anxious.    Physical Exam: Vitals:   04/12/16 1736 04/12/16 1800 04/12/16 1830 04/12/16 1900  BP:  (!) 203/106 (!) 204/97 184/99  Pulse: 82 74 74 69  Resp: 21 23 25 23   Temp:      SpO2: 98% 98% 98% 99%  Weight:      Height:       Physical Exam  Constitutional: She is oriented to person, place, and time. She appears well-developed. She is cooperative. No distress.  HENT:  Head: Normocephalic and atraumatic.  Right Ear: Hearing normal.  Left Ear: Hearing normal.  Nose: Nose normal.  Mouth/Throat: Mucous membranes are  normal.  Eyes: Pupils are equal, round, and reactive to light. Right eye exhibits no chemosis, no discharge and no exudate. Left eye exhibits no chemosis, no discharge and no exudate. Right conjunctiva is injected. Left conjunctiva is injected. Right eye exhibits nystagmus (several beats of rightward nystagmus on right gaze). Right eye exhibits normal extraocular motion. Left eye exhibits nystagmus (several beats of rightward nystagmus on right gaze). Left eye exhibits normal extraocular motion.  Fundoscopic exam:      The right eye shows red reflex.       The left eye shows red reflex.  No APD appreciated, but exam limited by lack of darkly lit room.  Cardiovascular: Normal rate, regular rhythm, S1 normal, S2 normal and intact distal pulses.  Exam reveals no gallop.   No murmur heard. Pulmonary/Chest: Effort normal and breath sounds normal. No respiratory distress. She has no wheezes. She has no rhonchi. She has no rales. She exhibits no  tenderness. Breasts are symmetrical.  Abdominal: Soft. Normal appearance and bowel sounds are normal. She exhibits no ascites. There is no hepatosplenomegaly. There is no tenderness. There is no CVA tenderness.  Musculoskeletal: Normal range of motion.  Neurological: She is alert and oriented to person, place, and time. She has normal strength.  Skin: Skin is warm, dry and intact. She is not diaphoretic.  Psychiatric: She has a normal mood and affect. Her speech is normal and behavior is normal.   Labs:  Recent Labs Lab 04/12/16 1645  LATICACIDVEN 1.36   CBC:  Recent Labs Lab 04/12/16 1502  WBC 7.8  NEUTROABS 5.3  HGB 12.7  HCT 38.7  MCV 71.5*  PLT 357   Basic Metabolic Panel:  Recent Labs Lab 04/12/16 1502  NA 140  K 3.6  CL 105  CO2 26  GLUCOSE 97  BUN 11  CREATININE 1.33*  CALCIUM 8.9   Cardiac Enzymes:  Recent Labs Lab 04/12/16 1626  TROPONINI 0.03*   BNP (last 3 results)  Recent Labs  04/12/16 1628  BNP 142.3*    Coagulation Studies:  Recent Labs  04/12/16 1626  LABPROT 14.2  INR 1.10   Liver Function Tests:  Recent Labs Lab 04/12/16 1502  AST 18  ALT 18  ALKPHOS 81  BILITOT 0.7  PROT 8.2*  ALBUMIN 3.7   CBG: Lab Results  Component Value Date   HGBA1C 6.2 (H) 05/01/2015   Urinalysis    Component Value Date/Time   COLORURINE STRAW (A) 04/12/2016 1626   APPEARANCEUR CLEAR 04/12/2016 1626   LABSPEC 1.009 04/12/2016 1626   PHURINE 7.0 04/12/2016 1626   GLUCOSEU NEGATIVE 04/12/2016 1626   HGBUR MODERATE (A) 04/12/2016 1626   BILIRUBINUR NEGATIVE 04/12/2016 1626   KETONESUR NEGATIVE 04/12/2016 1626   PROTEINUR 100 (A) 04/12/2016 1626   NITRITE NEGATIVE 04/12/2016 1626   LEUKOCYTESUR NEGATIVE 04/12/2016 1626   Imaging: EKG Interpretation  Date/Time:  Monday April 12 2016 16:16:52 EST Ventricular Rate:  71 PR Interval:    QRS Duration: 114 QT Interval:  458 QTC Calculation: 498 R Axis:   36 Text Interpretation:  Sinus rhythm Borderline prolonged PR interval Probable left atrial enlargement Left ventricular hypertrophy Nonspecific T abnormalities, lateral leads ST elev, probable normal early repol pattern Borderline prolonged QT interval Baseline wander in lead(s) II III aVF When compared to prior, no significant changes are seen No STEMI Confirmed by Sherry Ruffing MD, CHRISTOPHER [phone]) on 04/12/2016 5:17:41 PM  Echocardiogram   Collection Time: 05/01/15 10:52 AM   Study Conclusions  - Left ventricle: The cavity size was normal. There was severe concentric hypertrophy. Systolic function was normal. The estimated ejection fraction was in the range of 60% to 65%. Wall motion was normal; there were no regional wall motion abnormalities. Abnormal diastolic function with indeterminate LV filling pressure. - Left atrium: The atrium was normal in size. - Right atrium: The atrium was mildly dilated. - Inferior vena cava: The vessel was normal in size. The respirophasic diameter  changes were in the normal range (>= 50%), consistent with normal central venous pressure.  Impressions:  - LVEF 60-65%, severely increased LV wall thickness (>1.6 cm), normal wall motion, diastolic dysfunction with indeterminate LV filling pressure, mildly dilated RA, normal IVC.   Dg Chest 2 View Result Date: 04/12/2016 CLINICAL DATA:  Central chest pain, shortness of Breath with exertion EXAM: CHEST  2 VIEW COMPARISON:  10/23/2012 FINDINGS: Cardiomegaly is noted. Stable streaky atelectasis or scarring in right middle lobe and right perihilar.  No infiltrate or pleural effusion. No pulmonary edema. Mild degenerative changes lower thoracic spine IMPRESSION: No active cardiopulmonary disease. Stable scarring in right middle lobe and right perihilar. Electronically Signed   By: Lahoma Crocker M.D.   On: 04/12/2016 16:59   Ct Head Wo Contrast Result Date: 04/12/2016 CLINICAL DATA:  Visual changes, chest pain, shortness of breath EXAM: CT HEAD WITHOUT CONTRAST TECHNIQUE: Contiguous axial images were obtained from the base of the skull through the vertex without intravenous contrast. COMPARISON:  05/03/2015 FINDINGS: Brain: No evidence of acute infarction, hemorrhage, hydrocephalus, extra-axial collection or mass lesion/mass effect. Periventricular white matter low attenuation likely secondary to microvascular disease. Vascular: No hyperdense vessel or unexpected calcification. Skull: No osseous abnormality. Sinuses/Orbits: Visualized paranasal sinuses are clear. Visualized mastoid sinuses are clear. Visualized orbits demonstrate no focal abnormality. Other: None IMPRESSION: No acute intracranial pathology. Electronically Signed   By: Kathreen Devoid   On: 04/12/2016 17:39   MR Brain / MR Circle of Willis / MRA Neck IMPRESSION: 1. Punctate focus of acute to early subacute ischemia in the periventricular white matter of the left parietal lobe. No hemorrhage or mass effect. 2. Multifocal chronic microhemorrhage  in the temporal lobes, cerebellum and brainstem, in keeping with chronic hypertensive angiopathy. 3. Chronic microvascular ischemia. 4. Normal MRA of the head and neck.  Assessment & Plan by Problem: Active Problems:   Hypertensive emergency  Ms. Belinda Ramirez is a 41 y.o. female with poorly controlled HTN, HLD, h/o CVA who presents with hypertensive emergency and R eye vision loss.  1) Hypertensive Emergency: BP severely elevated to 230s/120s (MAP ~160) on presentation with evidence of end-organ damage w/ pos troponin and possible R eye pathology. 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 Poor compliance with medications. Had w/u for secondary HTN w/ negative renal duplex US, metanephrines, and RAAS studies (though pt was started on lisinopril prior to lab testing). OSA highly likely, though formal sleep studies was ordered and never completed. TSH wnl today. Will plan to control BP gradually given likelihood of prolonged period of uncontrolled HTN. MRI w/ chronic HTN angiopathy and concern for new acute/subacute L parietal stroke. - admit to tele - BP control w/ hydralazine IV PRN to keep BP ~170/100, goal MAP ~120 (25% reduction over 24hr) - would restart Clonidine slowly to avoid rebound HTN at 0.1mg  BID - hold other home meds: lisinopril 20mg  BID and hydral 50mg  TID - repeat BMP in AM. - trend trops -CPAP qHS  2) Vision Loss OD: 2 wk hx w/o improvement. Describes central scotoma. VF testing intact to confrontation. Concerning for retinal vascular crisis vs RD vs retinal edema. VH less likely. Also less likely central issue though h/o CVA w/ HTN bleed 04/2015. Past w/u demonstrated normal carotid US 06/2015 and HFpEF w/ LVH on echo 04/2015. CT negative today. Neurology c/s in the ED recs MRI. Will need Ophtho c/s as local ophthalmic pathology most likely. No pain, no EOM deficits, no obvious VF deficits though testing is limited. No APD detected, but sub-optimal exam in ED  hallway. OS VA wnl. - MRI findings not consistent with visual loss - Ophthalmology c/s appreciate recs - HTN control as above  *s/w Dr. Valetta Close of [facility]. He recommende full HVF testing which would need to be preformed in his office. With pt's subacute presentation, there is no acute intervention other than BP control initially. Given the benefit of having all the necessary equipment in his office, he requests that the pt be referred for immediate  evaluation and VF testing at his clinic after stabilization for DC. Dr. Valetta Close can be reached at [phone] on his cell phone, please call with 4-6 hours prior notification of DC so that the pt may be scheduled in the ophthalmology clnic the same day.  3) High risk Vit D Def: AA w/ CKD. Will check Vit D level.  4) Concern for Fe Def: Hgb 12.7 but MCV depressed and RDW increased in menstruating female. Will check ferritin.  DVT PPx - low molecular weight heparin  Code Status - Full  Consults Placed - Neurology, Ophthalmology  Dispo: Admit patient to Observation with expected length of stay less than 2 midnights.  Signed: Holley Raring, MD 04/12/2016, 7:46 PM  Pager: [phone]

## 2016-04-12 NOTE — ED Notes (Signed)
Intensivist at bedside.

## 2016-04-12 NOTE — ED Notes (Signed)
Pt here and reports loss ov vision in right eye and pt very hypertensive at 201/91. Spoke with physician and pt being sent to the ER for further evaluation. Pt being driven in shuttle to ED.

## 2016-04-12 NOTE — ED Notes (Signed)
Attempted report x 2 

## 2016-04-12 NOTE — ED Notes (Signed)
Patient transported to MRI 

## 2016-04-12 NOTE — ED Notes (Signed)
Patient transported to X-ray 

## 2016-04-12 NOTE — ED Provider Notes (Signed)
[facility] DEPT Provider Note   CSN: [id number] Arrival date & time: 04/12/16  1418     History   Chief Complaint Chief Complaint  Patient presents with  . Loss of Vision    HPI Belinda Ramirez is a 41 y.o. female with a past medical history significant for hypertension, obesity, hypercholesterolemia and prior stroke who presents with a two-week history of right eye vision loss and a several month history of exertional shortness of breath, exertional chest pain, and urinary incontinence. Patient reports that 2 weeks ago, she woke up one morning and could not see anything out of her right eye. She reports that she is able to see some shapes and silhouettes but everything "looks dark". She denies any difficulty in vision with her left eye. She reports that several weeks ago, she had numbness and droopiness in her right face but that resolved. She does not complain of any those complex this time. She says that she was advised to take her blood pressure medication in the daytime but says it "makes her feel bad" so she takes it at night. She says that her blood pressure has "always been bad". 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 She says this is unchanged. She says that over the last few months, she has had exertional shortness of breath whenever she walks too far or does too much physically. She says when she experienced this, she also gets a sharp chest pain. She describes it as moderate but it goes away with rest. She reports no headaches, nausea, vomiting, fevers, chills, changes in bowel movements. She does report that she has occasional urinary incontinence with exertion.  She does report recent rhinorrhea and congestion but no cough.    HPI  Past Medical History:  Diagnosis Date  . High cholesterol   . Hypertension   . Obesity   . Sickle cell trait ([place])   . Stroke [facility])     Patient Active Problem  List   Diagnosis Date Noted  . OSA (obstructive sleep apnea) 06/19/2015  . 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 HTN (hypertension), malignant   . HLD (hyperlipidemia)   . Morbid obesity due to excess calories ([place])   . Intraparenchymal hematoma of brain ([place]) 04/30/2015  . ICH (intracerebral hemorrhage) ([place]) 04/30/2015  . Hypertensive emergency   . Hyperlipidemia   . Obesity   . AKI (acute kidney injury) ([place])   . Hypokalemia     Past Surgical History:  Procedure Laterality Date  . TUBAL LIGATION      OB History    No data available       Home Medications    Prior to Admission medications   Medication Sig Start Date End Date Taking? Authorizing Provider  albuterol (PROVENTIL HFA;VENTOLIN HFA) 108 (90 Base) MCG/ACT inhaler Inhale 1-2 puffs into the lungs every 6 (six) hours as needed for wheezing or shortness of breath (or persistent coughing). 10/30/15   Harrie Foreman, MD  amoxicillin (AMOXIL) 500 MG capsule Take 1 capsule (500 mg total) by mouth 3 (three) times daily. 03/23/16   Kristen N Ward, DO  atorvastatin (LIPITOR) 40 MG tablet Take 1 tablet (40 mg total) by mouth daily at 6 PM. 05/06/15   David L Rinehuls, PA-C  cloNIDine (CATAPRES) 0.3 MG tablet Take 1 tablet (0.3 mg total) by mouth 2 (two) times daily. 03/23/16   [place], DO  hydrALAZINE (APRESOLINE) 50 MG tablet Take 1 tablet (50 mg total) by mouth 3 (three) times daily. 03/23/16   Kristen N Ward, DO  HYDROcodone-acetaminophen (NORCO/VICODIN) 5-325 MG tablet Take 1 tablet by mouth every 6 (six) hours as needed. 03/23/16   Kristen N Ward, DO  lisinopril (PRINIVIL,ZESTRIL) 20 MG tablet Take 1 tablet (20 mg total) by mouth 2 (two) times daily. 03/23/16   Delice Bison Ward, DO  Multiple Vitamin (MULTIVITAMIN WITH MINERALS) TABS tablet Take 1 tablet by mouth daily.    Historical Provider, MD    Family History Family History  Problem Relation Age of Onset  . Heart attack Maternal Grandfather   . Diabetes Mother     . Hypertension Mother   . Cancer Father   . Heart disease Maternal Grandmother     Social History Social History  Substance Use Topics  . Smoking status: Never Smoker  . Smokeless tobacco: Never Used  . Alcohol use 0.0 oz/week     Comment: occaisonal     Allergies   Latex; Oxycodone; and Percocet [oxycodone-acetaminophen]   Review of Systems Review of Systems  Constitutional: Negative for activity change, chills, diaphoresis, fatigue and fever.  HENT: Positive for congestion. Negative for rhinorrhea.   Eyes: Positive for visual disturbance.  Respiratory: Positive for chest tightness and shortness of breath. Negative for cough, wheezing and stridor.   Cardiovascular: Positive for chest pain. Negative for palpitations and leg swelling.  Gastrointestinal: Negative for abdominal distention, abdominal pain, constipation, diarrhea, nausea and vomiting.  Genitourinary: Negative for difficulty urinating, dysuria, flank pain, frequency, hematuria, menstrual problem, pelvic pain, vaginal bleeding and vaginal discharge.  Musculoskeletal: Negative for back pain, neck pain and neck stiffness.  Skin: Negative for rash and wound.  Neurological: Positive for light-headedness (when she takes her BP meds). Negative for dizziness, weakness, numbness and headaches.  Psychiatric/Behavioral: Negative for agitation and confusion.  All other systems reviewed and are negative.    Physical Exam Updated Vital Signs BP (!) 226/122   Pulse 82   Temp 98.2 F (36.8 C)   Resp 15   Ht 5\' 5"  (1.651 m)   Wt 260 lb (117.9 kg)   LMP 04/12/2016   SpO2 100%   BMI 43.27 kg/m   Physical Exam  Constitutional: She is oriented to person, place, and time. She appears well-developed and well-nourished. No distress.  HENT:  Head: Normocephalic and atraumatic.  Right Ear: External ear normal.  Left Ear: External ear normal.  Nose: Nose normal.  Mouth/Throat: Oropharynx is clear and moist. No oropharyngeal  exudate.  Eyes: Conjunctivae and EOM are normal. Pupils are equal, round, and reactive to light.  Neck: Normal range of motion. Neck supple.  Cardiovascular: Normal rate, regular rhythm, normal heart sounds and intact distal pulses.   No murmur heard. Pulmonary/Chest: Effort normal and breath sounds normal. No stridor. No respiratory distress. She has no wheezes. She exhibits no tenderness.  Abdominal: Soft. She exhibits no distension. There is no tenderness. There is no rebound.  Musculoskeletal: She exhibits no tenderness.  Neurological: She is alert and oriented to person, place, and time. She has normal reflexes. She is not disoriented. She displays no tremor and normal reflexes. A cranial nerve deficit is present. No sensory deficit. She exhibits normal muscle tone. She displays no seizure activity. Coordination normal. GCS eye subscore is 4. GCS verbal subscore is 5. GCS motor subscore is 6.  Vision drastically decreased in R eye. No other focal neuro deficits seen.   Skin:  Skin is warm. No rash noted. She is not diaphoretic. No erythema.  Nursing note and vitals reviewed.    ED Treatments / Results  Labs (all labs ordered are listed, but only abnormal results are displayed) Labs Reviewed  CBC - Abnormal; Notable for the following:       Result Value   RBC 5.41 (*)    MCV 71.5 (*)    MCH 23.5 (*)    RDW 19.6 (*)    All other components within normal limits  COMPREHENSIVE METABOLIC PANEL - Abnormal; Notable for the following:    Creatinine, Ser 1.33 (*)    Total Protein 8.2 (*)    GFR calc non Af Amer 49 (*)    GFR calc Af Amer 56 (*)    All other components within normal limits  URINALYSIS, ROUTINE W REFLEX MICROSCOPIC - Abnormal; Notable for the following:    Color, Urine STRAW (*)    Hgb urine dipstick MODERATE (*)    Protein, ur 100 (*)    Squamous Epithelial / LPF 0-5 (*)    All other components within normal limits  BRAIN NATRIURETIC PEPTIDE - Abnormal; Notable for  the following:    B Natriuretic Peptide 142.3 (*)    All other components within normal limits  TROPONIN I - Abnormal; Notable for the following:    Troponin I 0.03 (*)    All other components within normal limits  FERRITIN - Abnormal; Notable for the following:    Ferritin 9 (*)    All other components within normal limits  TROPONIN I - Abnormal; Notable for the following:    Troponin I 0.03 (*)    All other components within normal limits  TROPONIN I - Abnormal; Notable for the following:    Troponin I 0.03 (*)    All other components within normal limits  COMPREHENSIVE METABOLIC PANEL - Abnormal; Notable for the following:    Glucose, Bld 107 (*)    Creatinine, Ser 1.42 (*)    Albumin 3.1 (*)    GFR calc non Af Amer 45 (*)    GFR calc Af Amer 52 (*)    All other components within normal limits  CBC - Abnormal; Notable for the following:    Hemoglobin 11.4 (*)    HCT 35.9 (*)    MCV 71.5 (*)    MCH 22.7 (*)    RDW 19.5 (*)    All other components within normal limits  MRSA PCR SCREENING  URINE CULTURE  DIFFERENTIAL  PROTIME-INR  TSH  GLUCOSE, CAPILLARY  VITAMIN D 25 HYDROXY (VIT D DEFICIENCY, FRACTURES)  TROPONIN I  HEMOGLOBIN A1C  I-STAT CG4 LACTIC ACID, ED  POC URINE PREG, ED  I-STAT BETA HCG BLOOD, ED ([facility], [facility], [facility] ONLY)    EKG  EKG Interpretation  Date/Time:  Monday April 12 2016 16:16:52 EST Ventricular Rate:  71 PR Interval:    QRS Duration: 114 QT Interval:  458 QTC Calculation: 498 R Axis:   36 Text Interpretation:  Sinus rhythm Borderline prolonged PR interval Probable left atrial enlargement Left ventricular hypertrophy Nonspecific T abnormalities, lateral leads ST elev, probable normal early repol pattern Borderline prolonged QT interval Baseline wander in lead(s) II III aVF When compared to prior, no significant changes are seen No STEMI Confirmed by Sherry Ruffing MD, CHRISTOPHER [phone]) on 04/12/2016 5:17:41 PM       Radiology Dg Chest 2  View  Result Date: 04/12/2016 CLINICAL DATA:  Central chest pain, shortness of Breath  with exertion EXAM: CHEST  2 VIEW COMPARISON:  10/23/2012 FINDINGS: Cardiomegaly is noted. Stable streaky atelectasis or scarring in right middle lobe and right perihilar. No infiltrate or pleural effusion. No pulmonary edema. Mild degenerative changes lower thoracic spine IMPRESSION: No active cardiopulmonary disease. Stable scarring in right middle lobe and right perihilar. Electronically Signed   By: Lahoma Crocker M.D.   On: 04/12/2016 16:59   Ct Head Wo Contrast  Result Date: 04/12/2016 CLINICAL DATA:  Visual changes, chest pain, shortness of breath EXAM: CT HEAD WITHOUT CONTRAST TECHNIQUE: Contiguous axial images were obtained from the base of the skull through the vertex without intravenous contrast. COMPARISON:  05/03/2015 FINDINGS: Brain: No evidence of acute infarction, hemorrhage, hydrocephalus, extra-axial collection or mass lesion/mass effect. Periventricular white matter low attenuation likely secondary to microvascular disease. Vascular: No hyperdense vessel or unexpected calcification. Skull: No osseous abnormality. Sinuses/Orbits: Visualized paranasal sinuses are clear. Visualized mastoid sinuses are clear. Visualized orbits demonstrate no focal abnormality. Other: None IMPRESSION: No acute intracranial pathology. Electronically Signed   By: Kathreen Devoid   On: 04/12/2016 17:39   Mr Jodene Nam Head Wo Contrast  Result Date: 04/12/2016 CLINICAL DATA:  Loss of right eye vision.  Hypertension. EXAM: MR HEAD WITHOUT CONTRAST MR CIRCLE OF WILLIS WITHOUT CONTRAST MRA OF THE NECK WITHOUT AND WITH CONTRAST TECHNIQUE: Multiplanar, multiecho pulse sequences of the brain, circle of willis and surrounding structures were obtained without intravenous contrast. Angiographic images of the neck were obtained using MRA technique without and with intravenous contrast. CONTRAST:  78mL MULTIHANCE GADOBENATE DIMEGLUMINE 529 MG/ML IV SOLN  COMPARISON:  Head CT 04/12/2016 FINDINGS: MR HEAD FINDINGS Brain: There is a punctate focus of diffusion restriction within the periventricular white matter of the left parietal lobe. There is multifocal hyperintense T2-weighted signal within the periventricular white matter, most often seen in the setting of chronic microvascular ischemia. No mass lesion or midline shift. No hydrocephalus or extra-axial fluid collection. The midline structures are normal. No age advanced or lobar predominant atrophy. Vascular: Major intracranial arterial and venous sinus flow voids are preserved. There are multiple foci of chronic microhemorrhage in the bilateral temporal lobes, brainstem and the right cerebellar hemisphere. Skull and upper cervical spine: The visualized skull base, calvarium, upper cervical spine and extracranial soft tissues are normal. Sinuses/Orbits: No fluid levels or advanced mucosal thickening. No mastoid effusion. Normal orbits. MR CIRCLE OF WILLIS FINDINGS Intracranial internal carotid arteries: Normal. Anterior cerebral arteries: Normal. Middle cerebral arteries: Normal. Posterior communicating arteries: Absent bilaterally. Posterior cerebral arteries: Normal. Basilar artery: Normal. Vertebral arteries: Left dominant. Normal. Superior cerebellar arteries: Normal. Anterior inferior cerebellar arteries: Normal. Posterior inferior cerebellar arteries: Normal. MRA NECK FINDINGS Normal 3 vessel aortic arch branching pattern. The visualized proximal subclavian arteries are normal. The proximal arch vessels are normal. There is no carotid dissection, aneurysm or hemodynamically significant stenosis. Both vertebral arteries originate from the subclavian arteries and show no origin stenosis. Course and caliber of both vertebral arteries are normal. IMPRESSION: 1. Punctate focus of acute to early subacute ischemia in the periventricular white matter of the left parietal lobe. No hemorrhage or mass effect. 2.  Multifocal chronic microhemorrhage in the temporal lobes, cerebellum and brainstem, in keeping with chronic hypertensive angiopathy. 3. Chronic microvascular ischemia. 4. Normal MRA of the head and neck. Electronically Signed   By: Ulyses Jarred M.D.   On: 04/12/2016 20:55   Mr Angiogram Neck W Or Wo Contrast  Result Date: 04/12/2016 CLINICAL DATA:  Loss of right eye vision.  Hypertension. EXAM: MR HEAD WITHOUT CONTRAST MR CIRCLE OF WILLIS WITHOUT CONTRAST MRA OF THE NECK WITHOUT AND WITH CONTRAST TECHNIQUE: Multiplanar, multiecho pulse sequences of the brain, circle of willis and surrounding structures were obtained without intravenous contrast. Angiographic images of the neck were obtained using MRA technique without and with intravenous contrast. CONTRAST:  56mL MULTIHANCE GADOBENATE DIMEGLUMINE 529 MG/ML IV SOLN COMPARISON:  Head CT 04/12/2016 FINDINGS: MR HEAD FINDINGS Brain: There is a punctate focus of diffusion restriction within the periventricular white matter of the left parietal lobe. There is multifocal hyperintense T2-weighted signal within the periventricular white matter, most often seen in the setting of chronic microvascular ischemia. No mass lesion or midline shift. No hydrocephalus or extra-axial fluid collection. The midline structures are normal. No age advanced or lobar predominant atrophy. Vascular: Major intracranial arterial and venous sinus flow voids are preserved. There are multiple foci of chronic microhemorrhage in the bilateral temporal lobes, brainstem and the right cerebellar hemisphere. Skull and upper cervical spine: The visualized skull base, calvarium, upper cervical spine and extracranial soft tissues are normal. Sinuses/Orbits: No fluid levels or advanced mucosal thickening. No mastoid effusion. Normal orbits. MR CIRCLE OF WILLIS FINDINGS Intracranial internal carotid arteries: Normal. Anterior cerebral arteries: Normal. Middle cerebral arteries: Normal. Posterior  communicating arteries: Absent bilaterally. Posterior cerebral arteries: Normal. Basilar artery: Normal. Vertebral arteries: Left dominant. Normal. Superior cerebellar arteries: Normal. Anterior inferior cerebellar arteries: Normal. Posterior inferior cerebellar arteries: Normal. MRA NECK FINDINGS Normal 3 vessel aortic arch branching pattern. The visualized proximal subclavian arteries are normal. The proximal arch vessels are normal. There is no carotid dissection, aneurysm or hemodynamically significant stenosis. Both vertebral arteries originate from the subclavian arteries and show no origin stenosis. Course and caliber of both vertebral arteries are normal. IMPRESSION: 1. Punctate focus of acute to early subacute ischemia in the periventricular white matter of the left parietal lobe. No hemorrhage or mass effect. 2. Multifocal chronic microhemorrhage in the temporal lobes, cerebellum and brainstem, in keeping with chronic hypertensive angiopathy. 3. Chronic microvascular ischemia. 4. Normal MRA of the head and neck. Electronically Signed   By: Ulyses Jarred M.D.   On: 04/12/2016 20:55   Mr Brain Wo Contrast  Result Date: 04/12/2016 CLINICAL DATA:  Loss of right eye vision.  Hypertension. EXAM: MR HEAD WITHOUT CONTRAST MR CIRCLE OF WILLIS WITHOUT CONTRAST MRA OF THE NECK WITHOUT AND WITH CONTRAST TECHNIQUE: Multiplanar, multiecho pulse sequences of the brain, circle of willis and surrounding structures were obtained without intravenous contrast. Angiographic images of the neck were obtained using MRA technique without and with intravenous contrast. CONTRAST:  89mL MULTIHANCE GADOBENATE DIMEGLUMINE 529 MG/ML IV SOLN COMPARISON:  Head CT 04/12/2016 FINDINGS: MR HEAD FINDINGS Brain: There is a punctate focus of diffusion restriction within the periventricular white matter of the left parietal lobe. There is multifocal hyperintense T2-weighted signal within the periventricular white matter, most often seen in  the setting of chronic microvascular ischemia. No mass lesion or midline shift. No hydrocephalus or extra-axial fluid collection. The midline structures are normal. No age advanced or lobar predominant atrophy. Vascular: Major intracranial arterial and venous sinus flow voids are preserved. There are multiple foci of chronic microhemorrhage in the bilateral temporal lobes, brainstem and the right cerebellar hemisphere. Skull and upper cervical spine: The visualized skull base, calvarium, upper cervical spine and extracranial soft tissues are normal. Sinuses/Orbits: No fluid levels or advanced mucosal thickening. No mastoid effusion. Normal orbits. MR CIRCLE OF WILLIS FINDINGS Intracranial internal carotid arteries: Normal. Anterior  cerebral arteries: Normal. Middle cerebral arteries: Normal. Posterior communicating arteries: Absent bilaterally. Posterior cerebral arteries: Normal. Basilar artery: Normal. Vertebral arteries: Left dominant. Normal. Superior cerebellar arteries: Normal. Anterior inferior cerebellar arteries: Normal. Posterior inferior cerebellar arteries: Normal. MRA NECK FINDINGS Normal 3 vessel aortic arch branching pattern. The visualized proximal subclavian arteries are normal. The proximal arch vessels are normal. There is no carotid dissection, aneurysm or hemodynamically significant stenosis. Both vertebral arteries originate from the subclavian arteries and show no origin stenosis. Course and caliber of both vertebral arteries are normal. IMPRESSION: 1. Punctate focus of acute to early subacute ischemia in the periventricular white matter of the left parietal lobe. No hemorrhage or mass effect. 2. Multifocal chronic microhemorrhage in the temporal lobes, cerebellum and brainstem, in keeping with chronic hypertensive angiopathy. 3. Chronic microvascular ischemia. 4. Normal MRA of the head and neck. Electronically Signed   By: Ulyses Jarred M.D.   On: 04/12/2016 20:55    Procedures Procedures  (including critical care time)  CRITICAL CARE Performed by: Gwenyth Allegra Tegeler Total critical care time: 35 minutes Critical care time was exclusive of separately billable procedures and treating other patients. Critical care was necessary to treat or prevent imminent or life-threatening deterioration. Critical care was time spent personally by me on the following activities: development of treatment plan with patient and/or surrogate as well as nursing, discussions with consultants, evaluation of patient's response to treatment, examination of patient, obtaining history from patient or surrogate, ordering and performing treatments and interventions, ordering and review of laboratory studies, ordering and review of radiographic studies, pulse oximetry and re-evaluation of patient's condition.   Medications Ordered in ED Medications  heparin injection 5,000 Units (5,000 Units Subcutaneous Given 04/13/16 0649)  sodium chloride flush (NS) 0.9 % injection 3 mL (3 mLs Intravenous Given 04/13/16 1015)  acetaminophen (TYLENOL) tablet 650 mg (not administered)    Or  acetaminophen (TYLENOL) suppository 650 mg (not administered)  senna-docusate (Senokot-S) tablet 1 tablet (1 tablet Oral Not Given 04/12/16 2200)  promethazine (PHENERGAN) tablet 12.5 mg (not administered)  cloNIDine (CATAPRES - Dosed in mg/24 hr) patch 0.1 mg (0.1 mg Transdermal Patch Applied 04/13/16 1015)  hydrochlorothiazide (HYDRODIURIL) tablet 25 mg (25 mg Oral Given 04/13/16 1014)  ferumoxytol (FERAHEME) 510 mg in sodium chloride 0.9 % 100 mL IVPB (not administered)  labetalol (NORMODYNE,TRANDATE) injection 10 mg (10 mg Intravenous Given 04/12/16 1810)  gadobenate dimeglumine (MULTIHANCE) injection 20 mL (20 mLs Intravenous Contrast Given 04/12/16 2033)     Initial Impression / Assessment and Plan / ED Course  I have reviewed the triage vital signs and the nursing notes.  Pertinent labs & imaging results that were available  during my care of the patient were reviewed by me and considered in my medical decision making (see chart for details).     Mabelle Mungin is a 41 y.o. female with a past medical history significant for hypertension, obesity, hypercholesterolemia and prior stroke who presents with a two-week history of right eye vision loss and a several month history of exertional shortness of breath, exertional chest pain, and urinary incontinence.  History and exam are seen above.  On exam, patient has severely decreased vision in right eye. Patient reports that all shapes are "[place] blackness". Patient has normal extraocular movements. Patient has normal sensation, strength, and coordination in all strategies. Patient had clear lungs. Nontender chest is nontender abdomen. Symmetric pulses in all activities. No rashes seen.  Patient found to be hypertensive in triage with a systolic blood  pressure over 200.  Patient will quickly had head CT as clinically, there is concern for recurrent stroke. As patient's last normal was 2 weeks ago, will hold on calling code stroke. Patient will have laboratory testing and workup to further workup the exertional chest pain and shortness of breath as well as her urinary incontinence. Patient had no back tenderness or back pain.  Diagnostic workup results are seen above. Initial CT of the head showed no evidence of acute stroke. Neurology called and they recommended MRI and MRA of the head and neck.   This was ordered. Initial troponin positive. Due to her exertional chest pain and shortness of breath, in the setting of positive troponin, patient will be admitted for further workup of these findings as well as her neurologic deficit.  Patient given labetalol for her blood pressure however, in the setting of possible acute stroke, do not want to drop the pressure too far.  Patient will be admitted to internal medicine for further management and anticipate following up on  MRI results look for stroke. Patient admitted in stable condition.    Final Clinical Impressions(s) / ED Diagnoses   Final diagnoses:  Vision loss of right eye  Exertional chest pain  Exertional shortness of breath  Hypertensive emergency  Elevated troponin    Clinical Impression: 1. Vision loss of right eye   2. Exertional chest pain   3. Exertional shortness of breath   4. Hypertensive emergency   5. Elevated troponin   6. CVA (cerebral vascular accident) [facility])     Disposition: Admit to internal medicine service    Courtney Paris, MD 04/13/16 [phone]

## 2016-04-12 NOTE — ED Notes (Signed)
PA Alecia Lemming made aware of patient's complaint, advised he would place orders on patient at this time.

## 2016-04-13 ENCOUNTER — Inpatient Hospital Stay (HOSPITAL_BASED_OUTPATIENT_CLINIC_OR_DEPARTMENT_OTHER): Payer: PRIVATE HEALTH INSURANCE

## 2016-04-13 DIAGNOSIS — R079 Chest pain, unspecified: Secondary | ICD-10-CM

## 2016-04-13 DIAGNOSIS — R9431 Abnormal electrocardiogram [ECG] [EKG]: Secondary | ICD-10-CM

## 2016-04-13 DIAGNOSIS — R001 Bradycardia, unspecified: Secondary | ICD-10-CM

## 2016-04-13 DIAGNOSIS — Z8673 Personal history of transient ischemic attack (TIA), and cerebral infarction without residual deficits: Secondary | ICD-10-CM

## 2016-04-13 DIAGNOSIS — R0902 Hypoxemia: Secondary | ICD-10-CM

## 2016-04-13 DIAGNOSIS — H53131 Sudden visual loss, right eye: Secondary | ICD-10-CM

## 2016-04-13 DIAGNOSIS — H5461 Unqualified visual loss, right eye, normal vision left eye: Secondary | ICD-10-CM

## 2016-04-13 DIAGNOSIS — R0602 Shortness of breath: Secondary | ICD-10-CM

## 2016-04-13 DIAGNOSIS — Z8679 Personal history of other diseases of the circulatory system: Secondary | ICD-10-CM

## 2016-04-13 DIAGNOSIS — I161 Hypertensive emergency: Principal | ICD-10-CM

## 2016-04-13 DIAGNOSIS — N289 Disorder of kidney and ureter, unspecified: Secondary | ICD-10-CM

## 2016-04-13 DIAGNOSIS — D509 Iron deficiency anemia, unspecified: Secondary | ICD-10-CM

## 2016-04-13 DIAGNOSIS — R531 Weakness: Secondary | ICD-10-CM

## 2016-04-13 DIAGNOSIS — I10 Essential (primary) hypertension: Secondary | ICD-10-CM

## 2016-04-13 DIAGNOSIS — I6330 Cerebral infarction due to thrombosis of unspecified cerebral artery: Secondary | ICD-10-CM | POA: Insufficient documentation

## 2016-04-13 HISTORY — DX: Personal history of transient ischemic attack (TIA), and cerebral infarction without residual deficits: Z86.73

## 2016-04-13 LAB — ECHOCARDIOGRAM COMPLETE
E decel time: 176 msec
EERAT: 24.64
FS: 23 % — AB (ref 28–44)
HEIGHTINCHES: 65 in
IVS/LV PW RATIO, ED: 0.89
LA diam end sys: 34 mm
LADIAMINDEX: 1.52 cm/m2
LASIZE: 34 mm
LAVOL: 63.8 mL
LAVOLA4C: 62.1 mL
LAVOLIN: 28.5 mL/m2
LDCA: 3.8 cm2
LV E/e' medial: 24.64
LV TDI E'MEDIAL: 3.57
LVEEAVG: 24.64
LVELAT: 3.92 cm/s
LVOT SV: 68 mL
LVOT VTI: 18 cm
LVOT diameter: 22 mm
LVOT peak vel: 98.9 cm/s
Lateral S' vel: 10.9 cm/s
MV Dec: 176
MVPG: 4 mmHg
MVPKAVEL: 84.8 m/s
MVPKEVEL: 96.6 m/s
PW: 18.9 mm — AB (ref 0.6–1.1)
RV TAPSE: 24.9 mm
TDI e' lateral: 3.92
Weight: 4296.32 oz

## 2016-04-13 LAB — COMPREHENSIVE METABOLIC PANEL
ALBUMIN: 3.1 g/dL — AB (ref 3.5–5.0)
ALT: 15 U/L (ref 14–54)
ANION GAP: 7 (ref 5–15)
AST: 17 U/L (ref 15–41)
Alkaline Phosphatase: 71 U/L (ref 38–126)
BILIRUBIN TOTAL: 1 mg/dL (ref 0.3–1.2)
BUN: 11 mg/dL (ref 6–20)
CO2: 28 mmol/L (ref 22–32)
Calcium: 8.9 mg/dL (ref 8.9–10.3)
Chloride: 107 mmol/L (ref 101–111)
Creatinine, Ser: 1.42 mg/dL — ABNORMAL HIGH (ref 0.44–1.00)
GFR calc non Af Amer: 45 mL/min — ABNORMAL LOW (ref >60)
GFR, EST AFRICAN AMERICAN: 52 mL/min — AB (ref >60)
GLUCOSE: 107 mg/dL — AB (ref 65–99)
POTASSIUM: 3.9 mmol/L (ref 3.5–5.1)
SODIUM: 142 mmol/L (ref 135–145)
TOTAL PROTEIN: 7.1 g/dL (ref 6.5–8.1)

## 2016-04-13 LAB — MRSA PCR SCREENING: MRSA by PCR: NEGATIVE

## 2016-04-13 LAB — CBC
HEMATOCRIT: 35.9 % — AB (ref 36.0–46.0)
HEMOGLOBIN: 11.4 g/dL — AB (ref 12.0–15.0)
MCH: 22.7 pg — ABNORMAL LOW (ref 26.0–34.0)
MCHC: 31.8 g/dL (ref 30.0–36.0)
MCV: 71.5 fL — ABNORMAL LOW (ref 78.0–100.0)
Platelets: 304 10*3/uL (ref 150–400)
RBC: 5.02 MIL/uL (ref 3.87–5.11)
RDW: 19.5 % — ABNORMAL HIGH (ref 11.5–15.5)
WBC: 7.5 10*3/uL (ref 4.0–10.5)

## 2016-04-13 LAB — FERRITIN: Ferritin: 9 ng/mL — ABNORMAL LOW (ref 11–307)

## 2016-04-13 LAB — URINE CULTURE

## 2016-04-13 LAB — TROPONIN I
TROPONIN I: 0.03 ng/mL — AB (ref <0.03)
Troponin I: 0.03 ng/mL (ref <0.03)
Troponin I: 0.03 ng/mL (ref <0.03)

## 2016-04-13 LAB — GLUCOSE, CAPILLARY: Glucose-Capillary: 91 mg/dL (ref 65–99)

## 2016-04-13 MED ORDER — FERUMOXYTOL INJECTION 510 MG/17 ML
510.0000 mg | Freq: Once | INTRAVENOUS | Status: AC
  Administered 2016-04-13: 510 mg via INTRAVENOUS
  Filled 2016-04-13 (×2): qty 17

## 2016-04-13 MED ORDER — HYDROCHLOROTHIAZIDE 25 MG PO TABS
25.0000 mg | ORAL_TABLET | Freq: Every day | ORAL | Status: DC
  Administered 2016-04-13 – 2016-04-15 (×3): 25 mg via ORAL
  Filled 2016-04-13 (×3): qty 1

## 2016-04-13 MED ORDER — PERFLUTREN LIPID MICROSPHERE
1.0000 mL | INTRAVENOUS | Status: AC | PRN
  Administered 2016-04-13: 2 mL via INTRAVENOUS
  Filled 2016-04-13: qty 10

## 2016-04-13 MED ORDER — ATORVASTATIN CALCIUM 40 MG PO TABS
40.0000 mg | ORAL_TABLET | Freq: Every day | ORAL | Status: DC
  Administered 2016-04-13 – 2016-04-14 (×2): 40 mg via ORAL
  Filled 2016-04-13 (×2): qty 1

## 2016-04-13 MED ORDER — CLONIDINE HCL 0.1 MG/24HR TD PTWK
0.1000 mg | MEDICATED_PATCH | TRANSDERMAL | Status: DC
  Administered 2016-04-13: 0.1 mg via TRANSDERMAL
  Filled 2016-04-13: qty 1

## 2016-04-13 NOTE — Care Management Note (Signed)
Case Management Note  Patient Details  Name: Belinda Ramirez MRN: [id number] Date of Birth: September 16, 1975  Subjective/Objective:     Presents with  HTN crisis and sudden change in vision,  And left side weakness, no evidence for stroke, titrating bp meds .  She does have a hx of stroke,  She hs renal dysfunction and microcytic anemia.  NCM will cont to follow for dc needs.              Action/Plan:   Expected Discharge Date:                  Expected Discharge Plan:  Home/Self Care  In-House Referral:     Discharge planning Services  CM Consult  Post Acute Care Choice:    Choice offered to:     DME Arranged:    DME Agency:     HH Arranged:    HH Agency:     Status of Service:  In process, will continue to follow  If discussed at Long Length of Stay Meetings, dates discussed:    Additional Comments:  [name], RN 04/13/2016, 4:58 PM

## 2016-04-13 NOTE — Progress Notes (Signed)
Notified by Central tele monitoring of pt HR dropping down to 39 then back up to 78. Pt SB while asleep once pt is up HR goes back up to 70-80's. Pt sats also drop to the low 80's nonsustained while asleep. Pt denies wearing cpap at night but stated she has been told she has sleep apnea. MD made aware. CPAP ordered for bedtime. Pt declined CPAP for tonight due to the time but was encouraged and educated to wear CPAP tomorrow night. Will continue to monitor.

## 2016-04-13 NOTE — Progress Notes (Signed)
S/W DONNA H. @ [facility] # [id number]   CLONIDINE PATCH 0.01   COVER- YES  CO-PAY- $ 15.00  PRIOR APPROVAL- NO   PHARMACY : ANY RETAIL/[facility]

## 2016-04-13 NOTE — Progress Notes (Signed)
STROKE TEAM PROGRESS NOTE   HISTORY OF PRESENT ILLNESS (per record) Belinda Ramirez is an 41 y.o. female who presents with severe hypertension and right eye vision loss. Vision first became impaired about 2 weeks ago. She describes it as a patch of lost eyesight involving her central vision in the right eye, sparing the periphery. Recently was treated for right sided otitis media with effusion. She states that she is poorly compliant with her antihypertensive medications, taking them only at night as they make her "drowsy" if taken during the day. She has a home BP cuff but takes her BP reading only on Sundays.   MRI obtained this evening reveals a subacute punctate focus of restricted diffusion in the periventricular white matter of the left parietal lobe. This finding appears most likely to be secondary to small vessel occlusion secondary to vasospasm in the setting of malignant hypertension.   Patient was not administered IV t-PA secondary to delay in arrival. She was admitted for further evaluation and treatment.   SUBJECTIVE (INTERVAL HISTORY) No family is at bedside. Pt had right central vision loss for several days but found to have severe HTN. Concerning for retinal hemorrhage at fovea region. Ophthalmology consult recommended.    OBJECTIVE Temp:  [97.7 F (36.5 C)-99.2 F (37.3 C)] 98.7 F (37.1 C) (02/27 0853) Pulse Rate:  [63-82] 82 (02/27 0947) Cardiac Rhythm: Sinus bradycardia (02/27 0735) Resp:  [15-28] 28 (02/27 0500) BP: (139-231)/(74-122) 176/98 (02/27 0947) SpO2:  [92 %-100 %] 92 % (02/27 0500) Weight:  [117.9 kg (260 lb)-122 kg (268 lb 15.4 oz)] 121.8 kg (268 lb 8.3 oz) (02/27 0649)  CBC:  Recent Labs Lab 04/12/16 1502 04/13/16 0352  WBC 7.8 7.5  NEUTROABS 5.3  --   HGB 12.7 11.4*  HCT 38.7 35.9*  MCV 71.5* 71.5*  PLT 322 235    Basic Metabolic Panel:  Recent Labs Lab 04/12/16 1502 04/13/16 0352  NA 140 142  K 3.6 3.9  CL 105 107  CO2 26 28   GLUCOSE 97 107*  BUN 11 11  CREATININE 1.33* 1.42*  CALCIUM 8.9 8.9    Lipid Panel:    Component Value Date/Time   CHOL 187 05/01/2015 1131   TRIG 80 05/01/2015 1131   HDL 63 05/01/2015 1131   CHOLHDL 3.0 05/01/2015 1131   VLDL 16 05/01/2015 1131   LDLCALC 108 (H) 05/01/2015 1131   HgbA1c:  Lab Results  Component Value Date   HGBA1C 6.2 (H) 05/01/2015   Urine Drug Screen:    Component Value Date/Time   LABOPIA NONE DETECTED 05/01/2015 0700   COCAINSCRNUR NONE DETECTED 05/01/2015 0700   LABBENZ NONE DETECTED 05/01/2015 0700   AMPHETMU NONE DETECTED 05/01/2015 0700   THCU NONE DETECTED 05/01/2015 0700   LABBARB NONE DETECTED 05/01/2015 0700      IMAGING I have personally reviewed the radiological images below and agree with the radiology interpretations.  Ct Head Wo Contrast 04/12/2016 No acute intracranial pathology.   Mr Brain 4 Contrast Mr [facility] Wo Contrast Mr Angiogram Neck W Or Wo Contrast 04/12/2016 1. Punctate focus of acute to early subacute ischemia in the periventricular white matter of the left parietal lobe. No hemorrhage or mass effect. 2. Multifocal chronic microhemorrhage in the temporal lobes, cerebellum and brainstem, in keeping with chronic hypertensive angiopathy. 3. Chronic microvascular ischemia. 4. Normal MRA of the head and neck.   2-D echocardiogram pending    PHYSICAL EXAM  Temp:  [97.7 F (36.5 C)-99.2 F (37.3  C)] 99.1 F (37.3 C) (02/27 1646) Pulse Rate:  [63-82] 82 (02/27 0947) Resp:  [21-28] 28 (02/27 0500) BP: (139-231)/(74-111) 176/98 (02/27 0947) SpO2:  [92 %-100 %] 92 % (02/27 0500) Weight:  [268 lb 8.3 oz (121.8 kg)-268 lb 15.4 oz (122 kg)] 268 lb 8.3 oz (121.8 kg) (02/27 0649)  General - morbid obesity, well developed, in no apparent distress  Ophthalmologic - Fundi not visualized well due to small pupils. For the region which is able to visualize, no obvious hemorrhage in retina.  Cardiovascular - Regular rate and  rhythm.  Mental Status -  Level of arousal and orientation to time, place, and person were intact. Language including expression, naming, repetition, comprehension was assessed and found intact. Fund of Knowledge was assessed and was intact.  Cranial Nerves II - XII - II - right central vision loss. III, IV, VI - Extraocular movements intact. V - Facial sensation intact bilaterally. VII - Facial movement intact bilaterally. VIII - Hearing & vestibular intact bilaterally. X - Palate elevates symmetrically. XI - Chin turning & shoulder shrug intact bilaterally. XII - Tongue protrusion intact.  Motor Strength - The patient's strength was normal in all extremities except left LE 4+/5 which is chronic and pronator drift was absent.  Bulk was normal and fasciculations were absent.   Motor Tone - Muscle tone was assessed at the neck and appendages and was normal.  Reflexes - The patient's reflexes were 1+ in all extremities and she had no pathological reflexes.  Sensory - Light touch, temperature/pinprick were assessed and were symmetrical.    Coordination - The patient had normal movements in the hands with no ataxia or dysmetria.  Tremor was absent.  Gait and Station - deferred   ASSESSMENT/PLAN [name] is a 41 y.o. female with history of severe HTN presenting with R eye vision loss. She did not receive IV t-PA due to delay in arrival.   Right eye central vision loss   Not consistent with neurological condition.  Concerning for fovea hemorrhage in the setting of severe HTN, however, for the fundi region I can see there is no hemorrhage, however, fundi exam is poor due to small pupils.   Recommend stat ophthalmology consultation  Primary team contacted Dr. Valetta Close from ophthalmology  Stroke:  Incidental L parietal lobe ischemic white matter infarct secondary to small vessel disease from hypertensive source  MRI  left parietal periventricular white matter infarct.  Hypertensive angiopathy  MRA head and neck normal  2D Echo  pending  LDL pending   HgbA1c pending  Heparin 5000 units sq tid for VTE prophylaxis  Diet heart healthy/carb modified Room service appropriate? Yes; Fluid consistency: Thin  No antithrombotic prior to admission, now on No antithrombotic due to previous ICH and concerning for fovea hemorrhage this time.  Ongoing aggressive stroke risk factor management  Therapy recommendations:  OP PT  Disposition:  pending   Hypertensive emergency  Blood pressure 230/120s on admission   Remains elevated but lowering  Permissive hypertension (OK if < 180/105) but gradually normalize in 5-7 days  Long-term BP goal normotensive  Not compliance with medication  Hyperlipidemia  Home meds:  Lipitor 40  LDL pending  Resumed lipitor 40  continue at discharge  Other Stroke Risk Factors  ETOH use, advised to drink no more than 1 drink(s) a day  Morbid Obesity, Body mass index is 44.68 kg/m., recommend weight loss, diet and exercise as appropriate   Hx stroke/TIA  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 Please call with questions. Pt will follow up with Dr. Erlinda Hong at [facility] in about 6 weeks. Thanks for the consult.  Rosalin Hawking, MD PhD Stroke Neurology 04/13/2016 5:02 PM   To contact Stroke Continuity provider, please refer to [email or web address] After hours, contact General Neurology

## 2016-04-13 NOTE — Progress Notes (Signed)
Subjective: Patient is doing well this morning. She still has loss of central vision in her right eye and mild left sided weakness, unchanged from admission. BP improved today with clonidine.   Objective:  Vital signs in last 24 hours: Vitals:   04/13/16 0500 04/13/16 0649 04/13/16 0853 04/13/16 0947  BP: (!) 178/90   (!) 176/98  Pulse: 63   82  Resp: (!) 28     Temp:   98.7 F (37.1 C)   TempSrc:   Oral   SpO2: 92%     Weight:  268 lb 8.3 oz (121.8 kg)    Height:       Physical Exam Constitutional: NAD, appears comfortable Cardiovascular: RRR, no murmurs, rubs, or gallops.  Pulmonary/Chest: CTAB, no wheezes, rales, or rhonchi.  Abdominal: Soft, non tender, non distended. +BS.  Extremities: Warm and well perfused. Distal pulses intact. No edema.  Neurological: A&Ox3, CN II - XII grossly intact. Decreased strength 4/5 on L upper extremity, 5/5 on R. Sensation intact bilaterally. No dysmetria on finger to nose.  Skin: No rashes or erythema  Psychiatric: Normal mood and affect   Assessment/Plan:  Belinda Ramirez is a 41 y.o. female with poorly controlled HTN, HLD, h/o CVA who presents with hypertensive emergency and R eye vision loss.  Hypertensive Emergency: Patient presented with blood pressure elevated in the 230s/120s with evidence of end-organ damage w/ mild elevation in troponin and loss of vision in R eye x 2 weeks likely due to medication non compliance. She is prescribed clonidine 0.3 BID, Hydralazine 50 mg TID, and Lisinopril 20 mg BID, but say she often only takes her medication at night because they make her feel bad. She also did not take any medications the day before presentation. She also stopped her lisinopril because she thought it had been discontinued at her last ED visit, however this change was not reflected in her chart. She has undergone work up for secondary HTN w/ negative renal duplex US, metanephrines, and RAAS studies. On admission, she received 10  mg IV labetalol and was started on clonidine 0.1 BID with goal reduction in MAP of 25% over the first 24 hours. BP responded appropriately, down to 170s/90s this morning on rounds. -- Change clonidine 0.1 mg to patch q weekly  -- Added HCTZ 25 mg daily  -- Holding home lisinopril and hydralazine for now  -- Trending troponins  -- Monitor vitals   Transient Hypoxia/Bradycardia:  Overnight patient reportedly had a transient episode of bradycardia with associated hypoxia while she was sleeping. Patient is overweight and at risk for OSA/Obesity hypoventilation syndrome which could certainly be contributing to her uncontrolled HTN. She reports a history of snoring. She would likely benefit from an outpatient sleep study. Will do a trial of CPAP tonight.  -- CPAP QHS  -- F/u PCP for sleep study   Vision Loss: Began two weeks ago, no improvement with better BP control. Overall concerning for retinal vascular crisis vs retinal detachment, although her vision loss is central rather than peripheral. MRI was negative for findings to explain her deficit.  Admitting team spoke with Dr. Valetta Close of [facility]. Once patient is stabilized, will plan to discharge with same day appointment for full ophthalmology evaluation.  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 Creatinine 1.11 one year ago, 1.3 on admission.  -- Daily BMP -- Avoid nephrotoxic  drugs -- Holding lisinopril   Microcytic Anemia: Mild and asymptomatic, with labs consistent with iron deficiency anemia in a menstruating female.  -- IV iron   FEN: No fluids, replete lytes prn, Heart healthy/carb mod  VTE ppx: Sub q heparin  Code Status: FULL   Dispo: Anticipated discharge in approximately 1-2 day(s).   Velna Ochs, MD 04/13/2016, 10:07 AM Pager: [phone]

## 2016-04-13 NOTE — Evaluation (Signed)
Physical Therapy Evaluation/ Discharge Patient Details Name: Belinda Ramirez MRN: [id number] DOB: [birth year] Today's Date: 04/13/2016   History of Present Illness  41 yo admitted with right eye vision change, HTN emergency and punctate focus of restricted diffusion in the periventricular white matter of the left parietal lobe with hx of basal ganglia ICH, HTN  Clinical Impression  Pt pleasant, moving well and demonstrates decreased strength of LLE as baseline, impaired balance and vision who will benefit from OPPT to maximize gait, strength and function. Pt encouraged not to drive and discuss with employer stool for working to decrease fall risk. Pt at baseline functional mobility with noted deficit to right eye vision, educated for stroke signs, symptoms and risk factors. No further acute needs will defer to OPPT. Pt aware and agreeable.    BP 174/97 pre 176/98 after walking  Follow Up Recommendations Outpatient PT    Equipment Recommendations  None recommended by PT    Recommendations for Other Services       Precautions / Restrictions Precautions Precautions: Fall Restrictions Weight Bearing Restrictions: No      Mobility  Bed Mobility               General bed mobility comments: EOB on arrival  Transfers Overall transfer level: Modified independent                  Ambulation/Gait Ambulation/Gait assistance: Modified independent (Device/Increase time) Ambulation Distance (Feet): 200 Feet Assistive device: None Gait Pattern/deviations: Decreased stance time - left;Decreased stride length;Step-through pattern   Gait velocity interpretation: Below normal speed for age/gender General Gait Details: pt with short cautious strides but no overt LOB with long hall ambulation  Stairs            Wheelchair Mobility    Modified Rankin (Stroke Patients Only) Modified Rankin (Stroke Patients Only) Pre-Morbid Rankin Score: No significant  disability Modified Rankin: No significant disability     Balance Overall balance assessment: Needs assistance   Sitting balance-Leahy Scale: Good       Standing balance-Leahy Scale: Good                               Pertinent Vitals/Pain Pain Assessment: No/denies pain    Home Living Family/patient expects to be discharged to:: Private residence Living Arrangements: Spouse/significant other Available Help at Discharge: Family;Available PRN/intermittently Type of Home: House Home Access: Stairs to enter Entrance Stairs-Rails: Right Entrance Stairs-Number of Steps: 4 Home Layout: One level Home Equipment: Cane - single point;Shower seat      Prior Function Level of Independence: Independent         Comments: 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[employer] in [place], drives and uses a Forensic psychologist        Extremity/Trunk Assessment   Upper Extremity Assessment Upper Extremity Assessment: Overall WFL for tasks assessed    Lower Extremity Assessment Lower Extremity Assessment: LLE deficits/detail LLE Deficits / Details: 3/5 hip flexion, knee flexion and extension, intact sensation    Cervical / Trunk Assessment Cervical / Trunk Assessment: Other exceptions Cervical / Trunk Exceptions: obese with forward head  Communication   Communication: No difficulties  Cognition Arousal/Alertness: Awake/alert Behavior During Therapy: WFL for tasks assessed/performed Overall Cognitive Status: Within Functional Limits for tasks assessed  General Comments      Exercises     Assessment/Plan    PT Assessment All further PT needs can be met in the next venue of care  PT Problem List Decreased strength;Decreased mobility;Decreased balance       PT Treatment Interventions      PT Goals (Current goals can be found in the Care Plan section)  Acute Rehab PT Goals Patient  Stated Goal: return to walking in the mall PT Goal Formulation: All assessment and education complete, DC therapy    Frequency     Barriers to discharge        Co-evaluation               End of Session Equipment Utilized During Treatment: Gait belt Activity Tolerance: Patient tolerated treatment well Patient left: in chair;with call bell/phone within reach Nurse Communication: Mobility status PT Visit Diagnosis: Other abnormalities of gait and mobility (R26.89)         Time: 5500-1642 PT Time Calculation (min) (ACUTE ONLY): 30 min   Charges:   PT Evaluation $PT Eval Moderate Complexity: 1 Procedure     PT G Codes:         Goldia Ligman B Tekia [place] 20-Apr-2016, 10:00 AM  Elwyn Reach, [place]

## 2016-04-13 NOTE — Evaluation (Signed)
Occupational Therapy Evaluation Patient Details Name: Belinda Ramirez MRN: [id number] DOB: September 12, 1975 Today's Date: 04/13/2016    History of Present Illness 41 yo admitted with right eye vision change, HTN emergency and punctate focus of restricted diffusion in the periventricular white matter of the left parietal lobe with hx of basal ganglia ICH, HTN   Clinical Impression   Impairment of central field and blurred vision for which pt was admitted persists. Pt is functioning at modified independent level in ADL and mobility. Requires increased time to negotiate obstacles around room and perform activities such as reading, but assisted only for lines. Pt continues to have baseline weakness of L UE, but well within functional limits for ADL and IADL. Educated pt and husband at length in safety related to impaired vision, including avoiding driving as well as signs and symptoms of stroke. No further OT needs.    Follow Up Recommendations  No OT follow up    Equipment Recommendations  None recommended by OT    Recommendations for Other Services       Precautions / Restrictions Precautions Precautions: Fall (due to vision/lines) Restrictions Weight Bearing Restrictions: No      Mobility Bed Mobility Overal bed mobility: Modified Independent                Transfers Overall transfer level: Modified independent Equipment used: None                  Balance Overall balance assessment: Needs assistance   Sitting balance-Leahy Scale: Good       Standing balance-Leahy Scale: Good                              ADL Overall ADL's : Modified independent                                     Functional mobility during ADLs: Modified independent General ADL Comments: Pt demonstrated ability to perform toileting, dressing, standing grooming and eating.     Vision Baseline Vision/History: No visual deficits Patient Visual Report: Central  vision impairment;Blurring of vision (R eye) Additional Comments: ocular movement intact, pt describes "blackness" in central vision, intact, but blurred peripheral vision on R, demonstrated ability to read with increased time     Perception     Praxis      Pertinent Vitals/Pain Pain Assessment: No/denies pain     Hand Dominance Right   Extremity/Trunk Assessment Upper Extremity Assessment Upper Extremity Assessment: Overall WFL for tasks assessed   Lower Extremity Assessment Lower Extremity Assessment: Defer to PT evaluation   Cervical / Trunk Assessment Cervical / Trunk Exceptions: obese    Communication Communication Communication: No difficulties   Cognition Arousal/Alertness: Awake/alert Behavior During Therapy: WFL for tasks assessed/performed Overall Cognitive Status: Within Functional Limits for tasks assessed                     General Comments       Exercises       Shoulder Instructions      Home Living Family/patient expects to be discharged to:: Private residence Living Arrangements: Spouse/significant other Available Help at Discharge: Family;Available PRN/intermittently Type of Home: House Home Access: Stairs to enter [employer] of Steps: 4 Entrance Stairs-Rails: Right Home Layout: One level     Bathroom Shower/Tub: Corporate investment banker:  Handicapped height     Home Equipment: Cane - single point;Shower seat          Prior Functioning/Environment Level of Independence: Independent        Comments: 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[employer] in [place], drives and uses a cane occasionally        OT Problem List: Decreased strength;Impaired vision/perception      OT Treatment/Interventions:      OT Goals(Current goals can be found in the care plan section) Acute Rehab OT Goals Patient Stated Goal: return to walking in the mall  OT Frequency:     Barriers to  D/C:            Co-evaluation              End of Session Equipment Utilized During Treatment: Gait belt  Activity Tolerance: Patient tolerated treatment well Patient left: in bed;with call bell/phone within reach;with family/visitor present  OT Visit Diagnosis: Muscle weakness (generalized) (M62.81);Other (comment) (impaired vision, one eye)                ADL either performed or assessed with clinical judgement  Time: 1355-1423 OT Time Calculation (min): 28 min Charges:  OT General Charges $OT Visit: 1 Procedure OT Evaluation $OT Eval Low Complexity: 1 Procedure OT Treatments $Self Care/Home Management : 8-22 mins G-Codes: OT G-codes **NOT FOR INPATIENT CLASS** Functional Assessment Tool Used: Clinical judgement Functional Limitation: Self care Self Care Current Status ([id number]): At least 1 percent but less than 20 percent impaired, limited or restricted Self Care Discharge Status [phone]): At least 1 percent but less than 20 percent impaired, limited or restricted    Malka So 04/13/2016, 2:35 PM  [phone]

## 2016-04-13 NOTE — Progress Notes (Signed)
Pt admitted to 4E from ED. Upon admission BP 175/105. Rechecked 207/97. MD paged, ordered 0.1mg  clonidine.   BP recheck 139/74. Will continue to monitor.

## 2016-04-13 NOTE — Progress Notes (Signed)
  Echocardiogram 2D Echocardiogram has been performed.  Jennette Dubin 04/13/2016, 1:56 PM

## 2016-04-14 DIAGNOSIS — Z79899 Other long term (current) drug therapy: Secondary | ICD-10-CM

## 2016-04-14 DIAGNOSIS — N183 Chronic kidney disease, stage 3 (moderate): Secondary | ICD-10-CM

## 2016-04-14 LAB — VITAMIN D 25 HYDROXY (VIT D DEFICIENCY, FRACTURES): Vit D, 25-Hydroxy: 7.4 ng/mL — ABNORMAL LOW (ref 30.0–100.0)

## 2016-04-14 LAB — BASIC METABOLIC PANEL
ANION GAP: 11 (ref 5–15)
BUN: 13 mg/dL (ref 6–20)
CALCIUM: 8.9 mg/dL (ref 8.9–10.3)
CHLORIDE: 102 mmol/L (ref 101–111)
CO2: 26 mmol/L (ref 22–32)
Creatinine, Ser: 1.38 mg/dL — ABNORMAL HIGH (ref 0.44–1.00)
GFR calc non Af Amer: 46 mL/min — ABNORMAL LOW (ref >60)
GFR, EST AFRICAN AMERICAN: 54 mL/min — AB (ref >60)
GLUCOSE: 86 mg/dL (ref 65–99)
POTASSIUM: 3.4 mmol/L — AB (ref 3.5–5.1)
Sodium: 139 mmol/L (ref 135–145)

## 2016-04-14 LAB — CBC
HCT: 36 % (ref 36.0–46.0)
HEMOGLOBIN: 11.7 g/dL — AB (ref 12.0–15.0)
MCH: 23.2 pg — ABNORMAL LOW (ref 26.0–34.0)
MCHC: 32.5 g/dL (ref 30.0–36.0)
MCV: 71.3 fL — AB (ref 78.0–100.0)
Platelets: 281 10*3/uL (ref 150–400)
RBC: 5.05 MIL/uL (ref 3.87–5.11)
RDW: 19.9 % — ABNORMAL HIGH (ref 11.5–15.5)
WBC: 7.6 10*3/uL (ref 4.0–10.5)

## 2016-04-14 LAB — LIPID PANEL
CHOL/HDL RATIO: 2.7 ratio
Cholesterol: 188 mg/dL (ref 0–200)
HDL: 69 mg/dL (ref >40)
LDL CALC: 104 mg/dL — AB (ref 0–99)
TRIGLYCERIDES: 75 mg/dL (ref <150)
VLDL: 15 mg/dL (ref 0–40)

## 2016-04-14 LAB — HEMOGLOBIN A1C
HEMOGLOBIN A1C: 5.9 % — AB (ref 4.8–5.6)
Mean Plasma Glucose: 123 mg/dL

## 2016-04-14 MED ORDER — POTASSIUM CHLORIDE CRYS ER 20 MEQ PO TBCR
40.0000 meq | EXTENDED_RELEASE_TABLET | Freq: Once | ORAL | Status: AC
  Administered 2016-04-14: 40 meq via ORAL
  Filled 2016-04-14: qty 2

## 2016-04-14 MED ORDER — HYDRALAZINE HCL 10 MG PO TABS
10.0000 mg | ORAL_TABLET | Freq: Once | ORAL | Status: AC
  Administered 2016-04-14: 10 mg via ORAL
  Filled 2016-04-14: qty 1

## 2016-04-14 MED ORDER — HYDRALAZINE HCL 20 MG/ML IJ SOLN
5.0000 mg | Freq: Once | INTRAMUSCULAR | Status: AC
  Administered 2016-04-14: 5 mg via INTRAVENOUS
  Filled 2016-04-14: qty 1

## 2016-04-14 MED ORDER — AMLODIPINE BESYLATE 10 MG PO TABS
10.0000 mg | ORAL_TABLET | Freq: Every day | ORAL | Status: DC
  Administered 2016-04-14 – 2016-04-15 (×2): 10 mg via ORAL
  Filled 2016-04-14 (×2): qty 1

## 2016-04-14 NOTE — Progress Notes (Signed)
   Subjective: Patient is doing well this morning. She still has loss of central vision in her right eye and mild left sided weakness, unchanged from admission. BP has begun to uptrend, SBP 190s this morning. She denies chest pain and SOB.   Objective:  Vital signs in last 24 hours: Vitals:   04/14/16 0634 04/14/16 0800 04/14/16 0814 04/14/16 0944  BP: (!) 186/103  (!) 192/96 (!) 194/98  Pulse:  72  92  Resp:    18  Temp:   99 F (37.2 C)   TempSrc:   Oral   SpO2:    94%  Weight:      Height:       Physical Exam Constitutional: NAD, appears comfortable Cardiovascular: RRR, no murmurs, rubs, or gallops.  Pulmonary/Chest: CTAB, no wheezes, rales, or rhonchi.  Abdominal: Soft, non tender, non distended. +BS.  Extremities: Warm and well perfused. Distal pulses intact. No edema.  Neurological: A&Ox3, CN II - XII grossly intact.   Assessment/Plan:  Ms. Marletta Bousquet is a 41 y.o. female with poorly controlled HTN, HLD, h/o CVA who presents with hypertensive emergency and R eye vision loss.  Hypertensive Emergency: Patient presented with blood pressure elevated in the 230s/120s with evidence of end-organ damage w/ mild elevation in troponin and loss of vision in R eye x 2 weeks, likely due to medication non compliance. On admission, she received 10 mg IV labetalol and was started on clonidine 0.1 BID with good response in BP, down to 170s/90s the next day. Unfortunately overnight her BP had begun to uptrend, now 190s/100. She is asymptomatic aside from her vision loss.  -- Continue clonidine 0.1 mg to patch q weekly  -- Continue HCTZ 25 mg daily  -- Add amlodipine 10 mg daily  -- Hydralazine 10 mg PO x 1 now -- Holding home lisinopril and hydralazine -- Monitor vitals   Transient Hypoxia/Bradycardia:  Overnight after admission patient reportedly had a transient episode of bradycardia with associated hypoxia while she was sleeping. Patient is overweight and at risk for OSA/Obesity  hypoventilation syndrome which could certainly be contributing to her uncontrolled HTN. She reported a history of snoring. She would likely benefit from an outpatient sleep study. A CPAP trial was ordered yesterday unfortunately patient did not wear it. Episodes of sleep apnea could certainly be contributing to her resistant HTN. Encouraged CPAP compliance.  -- CPAP QHS  -- F/u PCP for sleep study   Vision Loss: Began two weeks ago, no improvement with better BP control. Overall concerning for retinal vascular crisis vs retinal detachment, although her vision loss is central rather than peripheral. MRI was negative for findings to explain her deficit.  Admitting team spoke with Dr. Valetta Close of [facility]. Once patient is stabilized, will plan to discharge with same day appointment for full ophthalmology evaluation.  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 Creatinine 1.11 one year ago, 1.3 on admission and stable thus far. -- Daily BMP -- Avoid nephrotoxic drugs -- Holding lisinopril   Microcytic Anemia: Mild and asymptomatic, with labs consistent with iron deficiency anemia in a menstruating female.  -- s/p IV iron yesterday   FEN: No fluids, replete lytes prn, Heart healthy/carb mod  VTE ppx: Sub q heparin  Code Status: FULL   Dispo: Anticipated discharge pending improvement in BP.   Velna Ochs, MD 04/14/2016, 10:27 AM Pager: [phone]

## 2016-04-14 NOTE — Progress Notes (Signed)
Call MD and she stated that b/p goal is <180/110. Will call if b/p higher.

## 2016-04-14 NOTE — Progress Notes (Signed)
Informed Dr. Philipp Ovens that patients BP is still 194/98. MD to place orders.

## 2016-04-14 NOTE — Progress Notes (Signed)
   04/14/16 1955  Vitals  Temp 99.1 F (37.3 C)  Temp Source Oral  BP (!) 178/107  MAP (mmHg) 128  BP Location Left Arm  BP Method Automatic  Patient Position (if appropriate) Lying  Pulse Rate 80  Pulse Rate Source Monitor  ECG Heart Rate 80  Cardiac Rhythm NSR  Resp (!) 28  Oxygen Therapy  SpO2 95 %  O2 Device Room Air  Pre-WUA / WUA Start  [place] Agitation Sedation Scale (RASS) 0  RASS Goal 0  Pain Assessment  Pain Assessment No/denies pain  Pain Score 0  PCA/Epidural/Spinal Assessment  Respiratory Pattern Regular;Unlabored  Glasgow Coma Scale  Eye Opening 4  Best Verbal Response (NON-intubated) 5  Best Motor Response 6  Glasgow Coma Scale Score 15

## 2016-04-14 NOTE — Progress Notes (Signed)
Transitions of Care Pharmacy Note  Plan:  Educated on hypertension, nonpharmacologic measures Addressed concerns regarding poor response to antihypertensives Recommend 30 minutes of exercise daily, limiting salt intake to 2g daily, decreasing daily Pepsi intake, stress reduction techniques.   --------------------------------------------- Belinda Ramirez is an 41 y.o. female who presents with a chief complaint blurry vision and hypertensive emergency. In anticipation of discharge, pharmacy has reviewed this patient's prior to admission medication history, as well as current inpatient medications listed per the [facility].  Current medication indications, dosing, frequency, and notable side effects reviewed with patient. patient verbalized understanding of current inpatient medication regimen and is aware that the After Visit Summary when presented, will represent the most accurate medication list at discharge.   Belinda Ramirez expressed concerns regarding poor response to antihypertensive therapy and stress due to this poor response.   Assessment: Understanding of regimen: good Understanding of indications: good Potential of compliance: excellent Barriers to Obtaining Medications: No  Patient instructed to contact inpatient pharmacy team with further questions or concerns if needed.    Time spent preparing for discharge counseling: 10 minutes Time spent counseling patient: 45 minutes   Thank you for allowing pharmacy to be a part of this patient's care.  Dierdre Harness, Cain Sieve, PharmD Clinical Pharmacy Resident [phone] (Pager) 04/14/2016 7:35 PM

## 2016-04-14 NOTE — Progress Notes (Signed)
Paged Dr. Gay Filler- pt.'s BP increased some 182/104 and 194/93, and pt. c/o's H/A on (R) side of face; orders given.

## 2016-04-14 NOTE — Progress Notes (Signed)
Informed Dr. Philipp Ovens that patients BP is 192/96 and that morning BP medications have been given. Per MD continue to monitor Bp. MD states ok for patient to shower.

## 2016-04-14 NOTE — Progress Notes (Signed)
Informed MD that patients BP is 184/83. Per MD continue to monitor.

## 2016-04-14 NOTE — Progress Notes (Signed)
Informed Dr. Philipp Ovens that patients BP is 153/103. MD states to continue to monitor.

## 2016-04-15 DIAGNOSIS — R0602 Shortness of breath: Secondary | ICD-10-CM

## 2016-04-15 DIAGNOSIS — G4730 Sleep apnea, unspecified: Secondary | ICD-10-CM

## 2016-04-15 DIAGNOSIS — R0789 Other chest pain: Secondary | ICD-10-CM

## 2016-04-15 LAB — BASIC METABOLIC PANEL
ANION GAP: 10 (ref 5–15)
BUN: 19 mg/dL (ref 6–20)
CALCIUM: 9.1 mg/dL (ref 8.9–10.3)
CHLORIDE: 104 mmol/L (ref 101–111)
CO2: 24 mmol/L (ref 22–32)
CREATININE: 1.32 mg/dL — AB (ref 0.44–1.00)
GFR calc non Af Amer: 49 mL/min — ABNORMAL LOW (ref >60)
GFR, EST AFRICAN AMERICAN: 57 mL/min — AB (ref >60)
Glucose, Bld: 88 mg/dL (ref 65–99)
Potassium: 3.5 mmol/L (ref 3.5–5.1)
SODIUM: 138 mmol/L (ref 135–145)

## 2016-04-15 LAB — GLUCOSE, CAPILLARY: GLUCOSE-CAPILLARY: 100 mg/dL — AB (ref 65–99)

## 2016-04-15 MED ORDER — HYDROCHLOROTHIAZIDE 25 MG PO TABS: 25.0000 mg | ORAL_TABLET | Freq: Every day | ORAL | 0 refills | Status: DC

## 2016-04-15 MED ORDER — CLONIDINE HCL 0.1 MG/24HR TD PTWK: 0.1000 mg | MEDICATED_PATCH | TRANSDERMAL | 12 refills | Status: DC

## 2016-04-15 MED ORDER — AMLODIPINE BESYLATE 10 MG PO TABS: 10.0000 mg | ORAL_TABLET | Freq: Every day | ORAL | 0 refills | Status: DC

## 2016-04-15 NOTE — Discharge Summary (Signed)
Name: Belinda Ramirez MRN: [id number] DOB: 1976/01/14 41 y.o. PCP: Elisabeth Cara, PA-C  Date of Admission: 04/12/2016  3:44 PM Date of Discharge: 04/15/2016 Attending Physician: Annia Belt, MD  Discharge Diagnosis: Active Problems:   Hypertensive emergency   Vision loss of right eye   Exertional shortness of breath   Exertional chest pain   Cerebral thrombosis with cerebral infarction   History of cerebral hemorrhage   Discharge Medications: Allergies as of 04/15/2016      Reactions   Oxycodone Hives   Percocet [oxycodone-acetaminophen] Hives   Latex Itching      Medication List    STOP taking these medications   amoxicillin 500 MG capsule Commonly known as:  AMOXIL   cloNIDine 0.3 MG tablet Commonly known as:  CATAPRES Replaced by:  cloNIDine 0.1 mg/24hr patch   hydrALAZINE 50 MG tablet Commonly known as:  APRESOLINE   ibuprofen 200 MG tablet Commonly known as:  ADVIL,MOTRIN     TAKE these medications   amLODipine 10 MG tablet Commonly known as:  NORVASC Take 1 tablet (10 mg total) by mouth daily. Start taking on:  04/16/2016   atorvastatin 40 MG tablet Commonly known as:  LIPITOR Take 1 tablet (40 mg total) by mouth daily at 6 PM.   cloNIDine 0.1 mg/24hr patch Commonly known as:  CATAPRES - Dosed in mg/24 hr Place 1 patch (0.1 mg total) onto the skin once a week. Start taking on:  04/20/2016 Replaces:  cloNIDine 0.3 MG tablet   hydrochlorothiazide 25 MG tablet Commonly known as:  HYDRODIURIL Take 1 tablet (25 mg total) by mouth daily. Start taking on:  04/16/2016   lisinopril 20 MG tablet Commonly known as:  PRINIVIL,ZESTRIL Take 1 tablet (20 mg total) by mouth 2 (two) times daily.   multivitamin with minerals Tabs tablet Take 1 tablet by mouth daily.      Disposition and follow-up:   [name] was discharged from [facility] in Good condition.  At the hospital follow up visit please address:  1.   Hypertension: please assess compliance with current regimen. Consider taper and transition of clonidine to medication w/o risk for rebound, perhaps beta blocker. Titrate medications as appropriate. Right Vision Loss: please assess for persistence/improvement in symptoms. Follow up on Ophthalmology recommendations. Ensure pt makes appropriate Ophthalmology follow up.  2.  Labs / imaging needed at time of follow-up: BMP  Follow-up Appointments: Follow-up Information    Xu,Jindong, MD. Schedule an appointment as soon as possible for a visit in 6 week(s).   Specialty:  Neurology Contact information: [address] [place] [address] [phone]        Elisabeth Cara, PA-C Follow up on 04/22/2016.   Specialty:  Family Medicine Why:  Appointment at [facility]. Please arrive 54min early to check in. Contact information: [address] [place] [address] [phone]        Jola Schmidt, MD. Go on 04/15/2016.   Specialty:  Ophthalmology Why:  Appointment at 1pm for further evaluation of your vision loss. You will have a visual field test scheduled for 2pm. Contact information: [place] [address] [phone]           Hospital Course by problem list: Active Problems:   Hypertensive emergency   Vision loss of right eye   Exertional shortness of breath   Exertional chest pain   Cerebral thrombosis with cerebral infarction   History of cerebral hemorrhage  1. Hypertensive Emergency: Pt presented with uncontrolled blood pressures to 230s/120s. Pt complained of acute onset of right eye visual field deficit with central scotoma 2 weeks prior to presentation. She also noted progressive DOE, but denied other focal deficits. She had a history of HTN associated hemorrhagic CVA 1 year prior. She was evaluated for new CVA. 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 No other signs of acute intracranial abnormality and no changes which could be associated with central cause of her vision loss. He blood pressure was controlled slowly due to concern for chronic hypertension. She was discharged on a multidrug regimen including new medications of amlodipine and HCTZ. She was restarted on her home lisinopril after her creatinine was noted to be stable at her baseline with CKD III. She had previously been on clonidine PO BID, but endorsed poor compliance with this (and other) medication. She was transitioned to a clonidine patch for improved compliance, but we would consider transitioning her to another agent w/o the associated risk for rebound hyperension given issue with compliance in the past. She had a cardiac w/u for her c/o DOE which included no new changes on echo (persistant preserved LVEF and G1DD), and no ischemic changes to EKG. Troponins were trended and did not give evidence of coronary ischemia.  2. Right eye visual field deficit: Pt presented wit 2wk h/o painless right eye central visual field loss. This deficit has remained stable since onset. Brain imaging did not show any associated central lesion which could explain this result. Local ocular pathology is likely, but differential is still broad and will be best interrogated with formal ophthalmologic evaulation through direct and indirect ophthalmoscopy and [facility] Visual Field Testing. Dr. Valetta Close was consulted and recommended that the pt be evaluated in his clinic on the day of discharge after acute control of her BP. She was scheduled for this appointment the afternoon of her discharge.  3. Sleep Apnea: Pt has high likelihood of OSA, though has not had formal diagnosis with polysomnography. She has transient hypoxia while asleep and episodes of intermitted bradycardia that resolved completely on waking. These symptoms improved with initiation of CPAP qHS.  Uncontrolled OSA is likely a contributing factor to her malignant HTN and we recommend sleep study as an outpatient to qualify her for CPAP therapy.   Discharge Vitals:   BP (!) 158/99 (BP Location: Right Arm)   Pulse 73   Temp 98.3 F (36.8 C) (Oral)   Resp 18   Ht 5\' 5"  (1.651 m)   Wt 268 lb 15.4 oz (122 kg)   LMP 04/12/2016   SpO2 94%   BMI 44.76 kg/m   Pertinent Labs, Studies, and Procedures: As below  Procedures Performed:  Dg Chest 2 View  Result Date: 04/12/2016 CLINICAL DATA:  Central chest pain, shortness of Breath with exertion EXAM: CHEST  2 VIEW COMPARISON:  10/23/2012 FINDINGS: Cardiomegaly is noted. Stable streaky atelectasis or scarring in right middle lobe and right perihilar. No infiltrate or pleural effusion. No pulmonary edema. Mild degenerative changes lower thoracic spine IMPRESSION: No active cardiopulmonary disease. Stable scarring in right middle lobe and right perihilar. Electronically Signed   By: Lahoma Crocker M.D.   On: 04/12/2016 16:59   Ct Head Wo Contrast  Result Date: 04/12/2016 CLINICAL DATA:  Visual changes, chest pain, shortness of breath EXAM: CT HEAD WITHOUT CONTRAST TECHNIQUE: Contiguous axial images were  obtained from the base of the skull through the vertex without intravenous contrast. COMPARISON:  05/03/2015 FINDINGS: Brain: No evidence of acute infarction, hemorrhage, hydrocephalus, extra-axial collection or mass lesion/mass effect. Periventricular white matter low attenuation likely secondary to microvascular disease. Vascular: No hyperdense vessel or unexpected calcification. Skull: No osseous abnormality. Sinuses/Orbits: Visualized paranasal sinuses are clear. Visualized mastoid sinuses are clear. Visualized orbits demonstrate no focal abnormality. Other: None IMPRESSION: No acute intracranial pathology. Electronically Signed   By: Kathreen Devoid   On: 04/12/2016 17:39   Mr Brain/Neck Wo Contrast  Result Date: 04/12/2016 CLINICAL DATA:  Loss of  right eye vision.  Hypertension. EXAM: MR HEAD WITHOUT CONTRAST MR CIRCLE OF WILLIS WITHOUT CONTRAST MRA OF THE NECK WITHOUT AND WITH CONTRAST TECHNIQUE: Multiplanar, multiecho pulse sequences of the brain, circle of willis and surrounding structures were obtained without intravenous contrast. Angiographic images of the neck were obtained using MRA technique without and with intravenous contrast. CONTRAST:  61mL MULTIHANCE GADOBENATE DIMEGLUMINE 529 MG/ML IV SOLN COMPARISON:  Head CT 04/12/2016 FINDINGS: MR HEAD FINDINGS Brain: There is a punctate focus of diffusion restriction within the periventricular white matter of the left parietal lobe. There is multifocal hyperintense T2-weighted signal within the periventricular white matter, most often seen in the setting of chronic microvascular ischemia. No mass lesion or midline shift. No hydrocephalus or extra-axial fluid collection. The midline structures are normal. No age advanced or lobar predominant atrophy. Vascular: Major intracranial arterial and venous sinus flow voids are preserved. There are multiple foci of chronic microhemorrhage in the bilateral temporal lobes, brainstem and the right cerebellar hemisphere. Skull and upper cervical spine: The visualized skull base, calvarium, upper cervical spine and extracranial soft tissues are normal. Sinuses/Orbits: No fluid levels or advanced mucosal thickening. No mastoid effusion. Normal orbits. MR CIRCLE OF WILLIS FINDINGS Intracranial internal carotid arteries: Normal. Anterior cerebral arteries: Normal. Middle cerebral arteries: Normal. Posterior communicating arteries: Absent bilaterally. Posterior cerebral arteries: Normal. Basilar artery: Normal. Vertebral arteries: Left dominant. Normal. Superior cerebellar arteries: Normal. Anterior inferior cerebellar arteries: Normal. Posterior inferior cerebellar arteries: Normal. MRA NECK FINDINGS Normal 3 vessel aortic arch branching pattern. The visualized proximal  subclavian arteries are normal. The proximal arch vessels are normal. There is no carotid dissection, aneurysm or hemodynamically significant stenosis. Both vertebral arteries originate from the subclavian arteries and show no origin stenosis. Course and caliber of both vertebral arteries are normal. IMPRESSION: 1. Punctate focus of acute to early subacute ischemia in the periventricular white matter of the left parietal lobe. No hemorrhage or mass effect. 2. Multifocal chronic microhemorrhage in the temporal lobes, cerebellum and brainstem, in keeping with chronic hypertensive angiopathy. 3. Chronic microvascular ischemia. 4. Normal MRA of the head and neck. Electronically Signed   By: Ulyses Jarred M.D.   On: 04/12/2016 20:55   Consultations: Neurology   Discharge Instructions: Discharge Instructions    Ambulatory referral to Neurology    Complete by:  As directed    Pt will follow up with Dr. Erlinda Hong at [facility] in about 3 months. Thanks.   Call MD for:    Complete by:  As directed    Blood pressure greater than 200/110   Call MD for:  difficulty breathing, headache or visual disturbances    Complete by:  As directed    Call MD for:  persistant dizziness or light-headedness    Complete by:  As directed    Diet - low sodium heart healthy    Complete by:  As directed  Discharge instructions    Complete by:  As directed    Your blood pressure was very high when you arrived. We have started several new medications and changed several other medications. Please review the medication list associated with your discharge paper work. It is very important that you take your medications regularly and do not miss any doses. We will have follow up scheduled with your primary care doctor.  You vision loss in your right eye may be related to you blood pressure, but you will need to be evaluated by an eye doctor. We have made an appointment for you TODAY, with Dr. Valetta Close at [facility] for 1pm. Please  make sure to attend this appointment.  You will also have follow up with Neurology who saw you during your hospitalization. Someone should call you to schedule this appointment.   Increase activity slowly    Complete by:  As directed       Signed: Holley Raring, MD 04/15/2016, 9:49 AM   Pager: [phone]

## 2016-04-15 NOTE — Progress Notes (Signed)
04/14/2016-2300-Respiratory care note- Pt set/up with cpap for the night by RN.  No complications noted.

## 2016-04-15 NOTE — Progress Notes (Signed)
Paged MD about patient's blood pressure being 179/107. Dr. Benjamine Mola stated he is fine with her blood pressure remaining elevated and that she will be going to follow up appointments.

## 2016-04-15 NOTE — Care Management Note (Signed)
Case Management Note  Patient Details  Name: Belinda Ramirez MRN: [id number] Date of Birth: 11-14-1975  Subjective/Objective:   Presents with  HTN crisis and sudden change in vision,  And left side weakness, no evidence for stroke, titrating bp meds .  She does have a hx of stroke,  She hs renal dysfunction and microcytic anemia.  Per pt eval rec outpatient physical therapy, she states she would like to do this, NCM made referral to neuro rehabilitation center [address] [place], [place] thru epic.  NCM gave patient brochure with address and phone number and information pertaining to  [facility].  Also RN informed Network engineer to call opthalmology at the phone number provided.                               Action/Plan:   Expected Discharge Date:  04/15/16               Expected Discharge Plan:  Home/Self Care  In-House Referral:     Discharge planning Services  CM Consult  Post Acute Care Choice:    Choice offered to:     DME Arranged:    DME Agency:     HH Arranged:    HH Agency:     Status of Service:  Completed, signed off  If discussed at [employer] of Stay Meetings, dates discussed:    Additional Comments:  [name], RN 04/15/2016, 10:43 AM

## 2016-04-15 NOTE — Progress Notes (Addendum)
Subjective:  Pt wore CPAP ON and reportedly tolerated this well with good response in terms of BP, feels more rested this AM. No new complaints. Agrees to see Ophthalmology this afternoon after DC.  Discussed antihypertensive regimen and pt agrees, understands importance.  Objective:  Vital signs in last 24 hours: Vitals:   04/14/16 1955 04/14/16 2305 04/15/16 0315 04/15/16 0317  BP: (!) 178/107 (!) 158/92  (!) 164/96  Pulse: 80 81  79  Resp: (!) 28 (!) 23  (!) 32  Temp: 99.1 F (37.3 C) 98.4 F (36.9 C) 97.7 F (36.5 C)   TempSrc: Oral Oral Oral   SpO2: 95% 96%  95%  Weight:      Height:       Physical Exam Physical Exam  Constitutional: She appears well-developed. She is cooperative. No distress.  Obese  Eyes:  Stable central scotoma OD on confrontation testing  Cardiovascular: Normal rate, regular rhythm, normal heart sounds and normal pulses.  Exam reveals no gallop.   No murmur heard. Pulmonary/Chest: Effort normal and breath sounds normal. No respiratory distress. Breasts are symmetrical.  Abdominal: Soft. Bowel sounds are normal. There is no tenderness.  Musculoskeletal: She exhibits no edema.   Assessment/Plan:  Ms. Belinda Ramirez is a 41 y.o. female with poorly controlled HTN, HLD, h/o CVA who presents with hypertensive emergency and R eye vision loss.  Hypertensive Emergency: Patient presented with blood pressure elevated in the 230s/120s with evidence of end-organ damage w/ mild elevation in troponin and loss of vision in R eye x 2 weeks, likely due to medication non compliance. On admission, she received 10 mg IV labetalol and was started on clonidine 0.1 BID with good response in BP, down to 170s/90s the next day. Unfortunately overnight her BP had begun to uptrend, now 190s/100. She is asymptomatic aside from her vision loss.  -- Continue clonidine 0.1 mg to patch q weekly  -- Continue HCTZ 25 mg daily  -- Add amlodipine 10 mg daily  -- Plan to restart  home lisinopril on DC -- Will need outpt sleep study to qualify for CPAP to treat OSA -- May need home hydralazine added back  Transient Hypoxia/Bradycardia:  Overnight after admission patient reportedly had a transient episode of bradycardia with associated hypoxia while she was sleeping. Patient is overweight and at risk for OSA/Obesity hypoventilation syndrome which could certainly be contributing to her uncontrolled HTN. She reported a history of snoring. She would likely benefit from an outpatient sleep study. A CPAP trial was ordered yesterday unfortunately patient did not wear it. Episodes of sleep apnea could certainly be contributing to her resistant HTN. Encouraged CPAP compliance.  -- CPAP QHS  -- F/u PCP for sleep study   Vision Loss: Began two weeks ago, no improvement with better BP control. Overall concerning for retinal vascular crisis vs retinal detachment, although her vision loss is central rather than peripheral. MRI was negative for findings to explain her deficit.  Admitting team spoke with Dr. Valetta Close of [facility]. Once patient is stabilized, will plan to discharge with same day appointment for full ophthalmology evaluation.  -- BP control above -- Ophthalmology consult; scheduled for 1pm today w/ Dr. 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 Creatinine 1.11 one year ago, 1.3 on admission and stable thus far. -- Daily BMP -- restart lisinopril in stable CKD III  Microcytic Anemia: Mild and asymptomatic, with labs consistent with iron deficiency anemia in a menstruating female.  --  s/p IV iron yesterday   FEN: No fluids, replete lytes prn, Heart healthy/carb mod  VTE ppx: Sub q heparin  Code Status: FULL   Dispo: Anticipated discharge today.  Holley Raring, MD 04/15/2016, 6:55 AM Pager: [phone]  Internal Medicine Attending:   I saw and examined the patient. I reviewed the resident's note and I agree with  the resident's findings and plan as documented in the resident's note above. Lucious Groves, DO  04/15/16 5:00 PM

## 2016-04-15 NOTE — Progress Notes (Signed)
Discharge Note. Discharge instructions reviewed with patient and patient's fiance at the bedside. Patient has an appointment at 1300 with opthalmology which she is going to be going to straight from the hospital. Reviewed all medications and when to take them. Reviewed follow up appointments and all MD instructions including what numbers to keep blood pressure under and eating a heart healthy diet. Patient is ready for discharge.

## 2016-04-16 NOTE — Progress Notes (Signed)
Addendum to P.T. Note 2/27   04/13/16 1000  PT G-Codes **NOT FOR INPATIENT CLASS**  Functional Assessment Tool Used Clinical judgement  Functional Limitation Mobility: Walking and moving around  Mobility: Walking and Moving Around Current Status [phone]) CI  Mobility: Walking and Moving Around Goal Status [phone]) CI  Mobility: Walking and Moving Around Discharge Status [phone]) CI   Elwyn Reach, [place]

## 2016-04-20 ENCOUNTER — Ambulatory Visit: Payer: PRIVATE HEALTH INSURANCE | Attending: Oncology | Admitting: Physical Therapy

## 2016-04-20 VITALS — BP 151/101

## 2016-04-20 DIAGNOSIS — M6281 Muscle weakness (generalized): Secondary | ICD-10-CM | POA: Diagnosis present

## 2016-04-20 DIAGNOSIS — R2689 Other abnormalities of gait and mobility: Secondary | ICD-10-CM | POA: Insufficient documentation

## 2016-04-20 DIAGNOSIS — R2681 Unsteadiness on feet: Secondary | ICD-10-CM | POA: Diagnosis present

## 2016-04-20 NOTE — Therapy (Signed)
[facility] [address] [place], [place], [address] Phone: [phone]   Fax:  [phone]  Physical Therapy Evaluation  Patient Details  Name: Donnae Michels MRN: [id number] Date of Birth: May 18, 1975 Referring Provider: Waymon Budge Erlinda Hong, [name])  Encounter Date: 04/20/2016      PT End of Session - 04/20/16 1423    Visit Number 1   Number of Visits 18   Date for PT Re-Evaluation 06/19/16   Authorization Type [facility] EFF-30 visit PT limit; no auth required   Authorization - Visit Number 1   Authorization - Number of Visits 30   PT Start Time 4268   PT Stop Time 1234   PT Time Calculation (min) 47 min   Equipment Utilized During Treatment Gait belt   Activity Tolerance Patient tolerated treatment well   Behavior During Therapy WFL for tasks assessed/performed      Past Medical History:  Diagnosis Date  . High cholesterol   . Hypertension   . Obesity   . Sickle cell trait ([place])   . Stroke [facility])     Past Surgical History:  Procedure Laterality Date  . TUBAL LIGATION      Vitals:   04/20/16 1208 04/20/16 1234  BP: (!) 156/99 (!) 151/101         Subjective Assessment - 04/20/16 1152    Subjective Pt reports LLE weakness, with some falls.  Pt has had 5 falls in the past 6 months.  Pt reports LLE gives way and can't keep balance.  She uses cane.   Patient is accompained by: Family member  Husband, Kendrick   Pertinent History L ICH 04/2015, HTN, obesity (see full PMH in EPIC)   Patient Stated Goals To be able to walk straight again and to not stumble around as much.   Currently in Pain? --  occasional cramping in lower extremities            [facility] PT Assessment - 04/20/16 1158      Assessment   Medical Diagnosis cerbral thrombosis, weakness   Referring Provider Merian Capron, [place]   Onset Date/Surgical Date 04/12/16  ED visit     Precautions   Precautions Fall  No driving, no  working until clearance from MD     Balance Screen   Has the patient fallen in the past 6 months Yes   How many times? 5   Has the patient had a decrease in activity level because of a fear of falling?  Yes   Is the patient reluctant to leave their home because of a fear of falling?  No     Home Environment   Living Environment Private residence   Living Arrangements Spouse/significant other   Type of [place] to enter   Entrance Stairs-Number of Steps 4   Entrance Stairs-Rails Right;Left;Cannot reach both   [place] One level   [place] - single point     Prior Function   Level of Independence Independent;Independent with community mobility with device;Independent with household mobility without device  prior to [facility] 04/2015;    Vocation Full time employment  Psychologist, occupational and cook at [employer] Enjoys going to walk at park, going shopping at [employer]     Observation/Other Assessments   Focus on Therapeutic Outcomes (FOTO)  NA     ROM / Strength   AROM / PROM / Strength Strength     Strength  Strength Assessment Site Hip;Knee;Ankle   Right/Left Hip Right;Left   Right Hip Flexion 4/5   Left Hip Flexion 4/5   Right/Left Knee Right;Left   Right Knee Flexion 4/5   Right Knee Extension 4/5   Left Knee Flexion 3+/5   Left Knee Extension 3+/5   Right/Left Ankle Right;Left   Right Ankle Dorsiflexion 4/5   Left Ankle Dorsiflexion 3+/5     Transfers   Transfers Sit to Stand;Stand to Sit   Sit to Stand 6: Modified independent (Device/Increase time);With upper extremity assist;From chair/3-in-1   Stand to Sit 6: Modified independent (Device/Increase time);With upper extremity assist;To chair/3-in-1     Ambulation/Gait   Ambulation/Gait Yes   Ambulation/Gait Assistance 5: Supervision   Ambulation Distance (Feet) 150 Feet   Assistive device Straight cane   Gait Pattern Step-through pattern;Decreased step length -  right;Decreased step length - left;Decreased dorsiflexion - left;Poor foot clearance - left;Poor foot clearance - right   Ambulation Surface Level;Indoor   Gait velocity 22.85 sec = 1.43 ft/sec     Standardized Balance Assessment   Standardized Balance Assessment Timed Up and Go Test;Dynamic Gait Index     Dynamic Gait Index   Level Surface Moderate Impairment  11.15 sec in 20 ft with cane   Change in Gait Speed Moderate Impairment  request to sit due to LLE cramping   Gait with Horizontal Head Turns Moderate Impairment   Gait with Vertical Head Turns Moderate Impairment   Gait and Pivot Turn Moderate Impairment   Step Over Obstacle Moderate Impairment   Step Around Obstacles Mild Impairment   Steps Moderate Impairment  step to pattern per pt report-stairs being used   Total Score 9   DGI comment: Scores <19/24 are indicative of increased fall risk.     Timed Up and Go Test   Normal TUG (seconds) 26.06  with cane   TUG Comments Scores >13.5 sec indicate increased fall risk; >30 seconds indicates difficulty with ADLs in the home                     Self Care:      PT Education - 04/20/16 1421    Education provided Yes   Education Details Instructed briefly in CVA education-signs/symptoms of CVA and when to call 911; instructed in BP parameters for PT treatment (checking BP at home prior to coming to treatment); PT POC   Person(s) Educated Patient;Spouse   Methods Explanation   Comprehension Verbalized understanding          PT Short Term Goals - 04/20/16 1432      PT SHORT TERM GOAL #1   Title Pt will be independent with HEP for improved strengthl, balance, and gait.  TARGET 05/20/16   Time 5   Period Weeks   Status New     PT SHORT TERM GOAL #2   Title Pt will improve TUG score to less than or equal to 20 seconds for decreased fall risk.   Time 5   Period Weeks   Status New     PT SHORT TERM GOAL #3   Title Pt will improve DGI score to at least  14/24 for decreased fall risk.   Time 5   Period Weeks   Status New     PT SHORT TERM GOAL #4   Title Pt will be independent with sit<>stand transfer, no UE support, for improved transfer efficiency and safety.   Time 5   Period Weeks  Status New     PT SHORT TERM GOAL #5   Title Pt will verbalize understanding of CVA education.   Time 5   Period Weeks   Status New           PT Long Term Goals - 04/20/16 2101      PT LONG TERM GOAL #1   Title Pt will verbalize understanding of fall prevention in home environment.  TARGET:  06/19/16   Time 9   Period Weeks   Status New     PT LONG TERM GOAL #2   Title Pt will improve TUG score to less than or equal to 15 seconds for decreased fall risk.   Time 9   Period Weeks   Status New     PT LONG TERM GOAL #3   Title Pt will improve DGI score to at least 19/24 for decreased fall risk.   Time 9   Period Weeks   Status New     PT LONG TERM GOAL #4   Title Pt will improve gait velocity to at least 2 ft/sec for improved gait efficiency and safety.   Time 9   Period Weeks   Status New     PT LONG TERM GOAL #5   Title Pt will ambulate at least 1000 ft, indoor and outdoor surfaces, with single point cane versus no assistive device, for improved community ambulation.   Time 9   Period Weeks   Status New               Plan - 04/20/16 1427    Clinical Impression Statement Pt is a 41 year old female who has history of ICH with L sided weakness, with new hypertensive event going to ED 04/12/16, with loss of vision R eye.  Pt has been working, ambulating with cane out of home, but does report overall slowed mobility, as well as 5 falls in past 6 months.  Pt reports LLE gives way  and she ends up falling.  Pt has >3 co-morbidities-see EPIC.  Pt's presentation is evolving, as she has new onset visual issues, continued HTN (change of some medications at ED visit last week).  Pt presents with decreased strength, decreased balance,  decreased gait independence and safety, decreased endurance with gait.  Pt is at fall risk per DGI, TUG and gait velocity scores.  Pt would benefit from skilled physical therapy to address the above stated deficits for improved functional mobility, decreased fall risk, and safe return to work and leisure activities.   Rehab Potential Good   PT Frequency 2x / week  1x/wk for 1 week, then   PT Duration 8 weeks   PT Treatment/Interventions ADLs/Self Care Home Management;Functional mobility training;Gait training;Stair training;DME Instruction;Therapeutic activities;Therapeutic exercise;Balance training;Neuromuscular re-education;Patient/family education;Orthotic Fit/Training   PT Next Visit Plan Initiate HEP for lower extremity strength, balance; gait training for improved foot clearance and heelstrike on LLE   Consulted and Agree with Plan of Care Patient;Family member/caregiver   Family Member Consulted significant other, Kendrick      Patient will benefit from skilled therapeutic intervention in order to improve the following deficits and impairments:  Abnormal gait, Decreased activity tolerance, Decreased balance, Decreased mobility, Decreased strength, Difficulty walking  Visit Diagnosis: Other abnormalities of gait and mobility  Muscle weakness (generalized)  Unsteadiness on feet     Problem List Patient Active Problem List   Diagnosis Date Noted  . 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 Vision loss of  right eye   . Exertional shortness of breath   . Exertional chest pain   . History of cerebral hemorrhage   . OSA (obstructive sleep apnea) 06/19/2015  . Cytotoxic brain edema ([place]) 05/05/2015  . HTN (hypertension), malignant   . HLD (hyperlipidemia)   . Morbid obesity due to excess calories ([place])   . Intraparenchymal hematoma of brain ([place]) 04/30/2015  . ICH (intracerebral hemorrhage) ([place]) 04/30/2015  . Hypertensive emergency   . Hyperlipidemia   .  Obesity   . AKI (acute kidney injury) ([place])   . Hypokalemia     Demarion Pondexter W. 04/20/2016, 9:23 PM  Frazier Butt., PT  [facility] [address] [place], [place], [address] Phone: [phone]   Fax:  [phone]  Name: Krisandra Bueno MRN: [id number] Date of Birth: 10/12/1975

## 2016-04-22 ENCOUNTER — Ambulatory Visit: Payer: PRIVATE HEALTH INSURANCE | Admitting: Rehabilitation

## 2016-04-22 ENCOUNTER — Encounter: Payer: Self-pay | Admitting: Rehabilitation

## 2016-04-22 DIAGNOSIS — M6281 Muscle weakness (generalized): Secondary | ICD-10-CM

## 2016-04-22 DIAGNOSIS — R2689 Other abnormalities of gait and mobility: Secondary | ICD-10-CM

## 2016-04-22 DIAGNOSIS — R2681 Unsteadiness on feet: Secondary | ICD-10-CM

## 2016-04-22 NOTE — Patient Instructions (Addendum)
Bracing With Single Leg Bridging (Hook-Lying)    Lie with one hip and knee bent and spine neutral. Tighten pelvic floor and abdominals and hold. Lift same side of bottom. Repeat _10__ times. Do _1-2__ times a day.  In order to keep the right leg up over edge of couch or just make sure you don't use it, whichever is easiest.     Copyright  VHI. All rights reserved.   Hip Flexion / Knee Extension: Straight-Leg Raise (Eccentric)    Lie on back. Lift leg with knee straight. Slowly lower leg for 3-5 seconds. _10__ reps per set, _1-2__ sets per day, _5-7__ days per week. Lower like elevator, stopping at each floor.   Copyright  VHI. All rights reserved.   Abdominals: Single Leg Bend    Lying on back with legs out straight, inhale, then exhale while slowly sliding heel along floor toward buttocks, think about pushing heel into mat and pulling the whole time you are bending leg. Slowly return to starting position. Repeat _10___ times each leg per set. Do __1-2__ sets per session. Do __1-2__ sessions per day.  Copyright  VHI. All rights reserved.   CAREGIVER ASSISTED: Hamstrings - Supine    Caregiver holds leg at ankle and over knee; raises leg straight. Hold __60_ seconds. _2-3__ reps per set, _2-3__ sets per day, _5-7__ days per week.  Do this as needed, esp if you are having cramps.    Copyright  VHI. All rights reserved.     Functional Quadriceps: Sit to Stand    Sit on edge of chair, feet flat on floor. Stand upright, extending knees fully. Sit very slowly, stick your bottom out and bend your knees.  Repeat __10__ times per set. Do __1__ sets per session. Do __1-2__ sessions per day.  http://orth.exer.us/735   Copyright  VHI. All rights reserved.   Toe / Heel Raise    Gently rock back on heels and raise toes. Then rock forward on toes and raise heels. Repeat sequence __10__ times per session. Do __5-7__ sessions per week.  Copyright  VHI. All rights reserved.    Achilles Tendon Stretch    Stand with hands supported on wall, elbows slightly bent, feet parallel and both heels on floor, front knee bent, back knee straight. Slowly relax back knee until a stretch is felt in achilles tendon. Hold _60___ seconds. Repeat with leg positions switched.  Copyright  VHI. All rights reserved.

## 2016-04-22 NOTE — Therapy (Signed)
[facility] [address] [place], [place], [address] Phone: [phone]   Fax:  [phone]  Physical Therapy Treatment  Patient Details  Name: Belinda Ramirez MRN: [id number] Date of Birth: November 09, 1975 Referring Provider: Waymon Budge Erlinda Hong, [name])  Encounter Date: 04/22/2016      PT End of Session - 04/22/16 1809    Visit Number 2   Number of Visits 18   Date for PT Re-Evaluation 06/19/16   Authorization Type [facility] EFF-30 visit PT limit; no auth required   Authorization - Visit Number 2   Authorization - Number of Visits 30   PT Start Time 6203  pt late to appt   PT Stop Time 1446   PT Time Calculation (min) 34 min   Equipment Utilized During Treatment Gait belt   Activity Tolerance Patient tolerated treatment well   Behavior During Therapy WFL for tasks assessed/performed      Past Medical History:  Diagnosis Date  . High cholesterol   . Hypertension   . Obesity   . Sickle cell trait ([place])   . Stroke [facility])     Past Surgical History:  Procedure Laterality Date  . TUBAL LIGATION      There were no vitals filed for this visit.      Subjective Assessment - 04/22/16 1415    Subjective No changes, reports vision is still gone but seems "clearer."  My legs are having cramps really bad.     Patient is accompained by: Family member   Pertinent History L ICH 04/2015, HTN, obesity (see full PMH in EPIC)   Patient Stated Goals To be able to walk straight again and to not stumble around as much.   Currently in Pain? No/denies             TE:  See pt instruction for initial HEP provided for exercises and reps performed for LLE strengthening and stretching.                      PT Education - 04/22/16 1808    Education provided Yes   Education Details HEP for LE strengthening   Person(s) Educated Patient;Spouse   Methods Explanation;Demonstration;Handout   Comprehension  Verbalized understanding;Returned demonstration          PT Short Term Goals - 04/20/16 1432      PT SHORT TERM GOAL #1   Title Pt will be independent with HEP for improved strengthl, balance, and gait.  TARGET 05/20/16   Time 5   Period Weeks   Status New     PT SHORT TERM GOAL #2   Title Pt will improve TUG score to less than or equal to 20 seconds for decreased fall risk.   Time 5   Period Weeks   Status New     PT SHORT TERM GOAL #3   Title Pt will improve DGI score to at least 14/24 for decreased fall risk.   Time 5   Period Weeks   Status New     PT SHORT TERM GOAL #4   Title Pt will be independent with sit<>stand transfer, no UE support, for improved transfer efficiency and safety.   Time 5   Period Weeks   Status New     PT SHORT TERM GOAL #5   Title Pt will verbalize understanding of CVA education.   Time 5   Period Weeks   Status New  PT Long Term Goals - 04/20/16 2101      PT LONG TERM GOAL #1   Title Pt will verbalize understanding of fall prevention in home environment.  TARGET:  06/19/16   Time 9   Period Weeks   Status New     PT LONG TERM GOAL #2   Title Pt will improve TUG score to less than or equal to 15 seconds for decreased fall risk.   Time 9   Period Weeks   Status New     PT LONG TERM GOAL #3   Title Pt will improve DGI score to at least 19/24 for decreased fall risk.   Time 9   Period Weeks   Status New     PT LONG TERM GOAL #4   Title Pt will improve gait velocity to at least 2 ft/sec for improved gait efficiency and safety.   Time 9   Period Weeks   Status New     PT LONG TERM GOAL #5   Title Pt will ambulate at least 1000 ft, indoor and outdoor surfaces, with single point cane versus no assistive device, for improved community ambulation.   Time 9   Period Weeks   Status New               Plan - 04/22/16 1809    Clinical Impression Statement Skilled session focused on providing initial HEP in order  to address LLE weakness.  See pt instruction for details.    Rehab Potential Good   PT Frequency 2x / week  1x/wk for 1 week, then   PT Duration 8 weeks   PT Treatment/Interventions ADLs/Self Care Home Management;Functional mobility training;Gait training;Stair training;DME Instruction;Therapeutic activities;Therapeutic exercise;Balance training;Neuromuscular re-education;Patient/family education;Orthotic Fit/Training   PT Next Visit Plan  balance (add to HEP as needed); gait training for improved foot clearance and heelstrike on LLE   Consulted and Agree with Plan of Care Patient;Family member/caregiver   Family Member Consulted significant other, Kendrick      Patient will benefit from skilled therapeutic intervention in order to improve the following deficits and impairments:  Abnormal gait, Decreased activity tolerance, Decreased balance, Decreased mobility, Decreased strength, Difficulty walking  Visit Diagnosis: Other abnormalities of gait and mobility  Muscle weakness (generalized)  Unsteadiness on feet     Problem List Patient Active Problem List   Diagnosis Date Noted  . 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 Vision loss of right eye   . Exertional shortness of breath   . Exertional chest pain   . History of cerebral hemorrhage   . OSA (obstructive sleep apnea) 06/19/2015  . Cytotoxic brain edema ([place]) 05/05/2015  . HTN (hypertension), malignant   . HLD (hyperlipidemia)   . Morbid obesity due to excess calories ([place])   . Intraparenchymal hematoma of brain ([place]) 04/30/2015  . ICH (intracerebral hemorrhage) ([place]) 04/30/2015  . Hypertensive emergency   . Hyperlipidemia   . Obesity   . AKI (acute kidney injury) ([place])   . Hypokalemia     Cameron Sprang, PT, MPT [facility] [address] [place], [place], [address] Phone: [phone]   Fax:  [phone] 04/22/16, 6:12 PM  Name: Katerine Morua MRN: [id number] Date of Birth: 24-Jun-1975

## 2016-04-28 ENCOUNTER — Ambulatory Visit: Payer: PRIVATE HEALTH INSURANCE | Admitting: Physical Therapy

## 2016-04-29 ENCOUNTER — Ambulatory Visit: Payer: PRIVATE HEALTH INSURANCE | Admitting: Physical Therapy

## 2016-04-29 ENCOUNTER — Encounter: Payer: Self-pay | Admitting: Physical Therapy

## 2016-04-29 VITALS — BP 160/98 | HR 78

## 2016-04-29 DIAGNOSIS — R2689 Other abnormalities of gait and mobility: Secondary | ICD-10-CM | POA: Diagnosis not present

## 2016-04-29 DIAGNOSIS — R2681 Unsteadiness on feet: Secondary | ICD-10-CM

## 2016-04-29 DIAGNOSIS — M6281 Muscle weakness (generalized): Secondary | ICD-10-CM

## 2016-04-29 NOTE — Patient Instructions (Signed)
  Copyright  VHI. All rights reserved.  Feet Apart (Compliant Surface) Head Motion - Eyes Closed    Stand on compliant surface:  with feet shoulder width apart. Close eyes and hold for 30 seconds. Do 1 sessions per day. When can do 30 sec, begin  moving head slowly, up and down x 10 reps. Then side to side x 10 reps.  Copyright  VHI. All rights reserved.  Feet Together (Compliant Surface) Head Motion - Eyes Closed    Stand on compliant surface:  with feet shoulder width apart. Close eyes and hold for 30 seconds. Do 1 sessions per day. When can do 30 sec, begin  moving head slowly, up and down x 10 reps. Then side to side x 10 reps.   Copyright  VHI. All rights reserved.  Feet Partial Heel-Toe (Compliant Surface) Head Motion - Eyes Closed    Stand on compliant surface:  with feet shoulder width apart. Close eyes and hold for 30 seconds. Do 1 sessions per day. When can do 30 sec, begin  moving head slowly, up and down x 10 reps. Then side to side x 10 reps.   Copyright  VHI. All rights reserved.    Single Leg (Compliant Surface) - Eyes Open    Begin standing on regular floor and holding cane in right hand and lightly hold counter on your left.  Lift right leg while maintaining balance over other leg. Progress to removing hands from support surface for longer periods of time. Hold___30_ seconds. Repeat __3__ times per session. Do __1__ sessions per day.  To increase difficulty, Stand on compliant surface  Copyright  VHI. All rights reserved.

## 2016-04-29 NOTE — Therapy (Signed)
[facility] [address] [place], [place], [address] Phone: [phone]   Fax:  [phone]  Physical Therapy Treatment  Patient Details  Name: Belinda Ramirez MRN: [id number] Date of Birth: 1975-06-05 Referring Provider: Waymon Budge Erlinda Hong, [name])  Encounter Date: 04/29/2016      PT End of Session - 04/29/16 1216    Visit Number 3   Number of Visits 18   Date for PT Re-Evaluation 06/19/16   Authorization Type [facility] EFF-30 visit PT limit; no auth required   Authorization - Visit Number 3   Authorization - Number of Visits 30   PT Start Time 0808   PT Stop Time 0848   PT Time Calculation (min) 40 min   Equipment Utilized During Treatment Gait belt   Activity Tolerance Patient tolerated treatment well   Behavior During Therapy [facility] for tasks assessed/performed      Past Medical History:  Diagnosis Date  . High cholesterol   . Hypertension   . Obesity   . Sickle cell trait ([place])   . Stroke [facility])     Past Surgical History:  Procedure Laterality Date  . TUBAL LIGATION      Vitals:   04/29/16 0815  BP: (!) 160/98  Pulse: 78        Subjective Assessment - 04/29/16 0811    Subjective Reports her LLE locked on her Tues night. Painful. Able to sit and work through it to get it unlocked. Saw Dr. Belva Bertin yesterday and had xrays that were negative.    Patient is accompained by: Family member   Pertinent History L ICH 04/2015, HTN, obesity (see full PMH in EPIC)   Patient Stated Goals To be able to walk straight again and to not stumble around as much.   Currently in Pain? No/denies                         [facility] Adult PT Treatment/Exercise - 04/29/16 0001      Transfers   Transfers Sit to Stand;Stand to Sit   Sit to Stand 6: Modified independent (Device/Increase time);Without upper extremity assist   Stand to Sit 6: Modified independent (Device/Increase time);To chair/3-in-1;Without  upper extremity assist   Number of Reps 10 reps     Ambulation/Gait   Ambulation/Gait Assistance 5: Supervision   Ambulation/Gait Assistance Details due to knee instability; h/o falls   Ambulation Distance (Feet) 150 Feet  110, 100   Assistive device Straight cane   Gait Pattern Step-through pattern;Decreased step length - right;Decreased step length - left;Decreased dorsiflexion - left;Poor foot clearance - left;Poor foot clearance - right   Ambulation Surface Level;Indoor   Gait velocity remains very slow             Balance Exercises - 04/29/16 1207      Balance Exercises: Standing   Standing Eyes Opened Narrow base of support (BOS);Head turns;Foam/compliant surface;Other reps (comment)  10 reps; feet together; tandem   Standing Eyes Closed Wide (BOA);Foam/compliant surface;20 secs;3 reps   Tandem Stance Eyes open;Upper extremity support 2;3 reps;20 secs  cane rt hand, counter LUE light   SLS Eyes open;Solid surface;Upper extremity support 2;3 reps;10 secs  cane rt hand, counter LUE light           PT Education - 04/29/16 1215    Education provided Yes   Education Details HEP for balance and how to progress difficulty   Person(s) Educated Patient   Methods Explanation;Demonstration;Verbal cues;Handout  Comprehension Verbalized understanding;Returned demonstration;Need further instruction          PT Short Term Goals - 04/20/16 1432      PT SHORT TERM GOAL #1   Title Pt will be independent with HEP for improved strengthl, balance, and gait.  TARGET 05/20/16   Time 5   Period Weeks   Status New     PT SHORT TERM GOAL #2   Title Pt will improve TUG score to less than or equal to 20 seconds for decreased fall risk.   Time 5   Period Weeks   Status New     PT SHORT TERM GOAL #3   Title Pt will improve DGI score to at least 14/24 for decreased fall risk.   Time 5   Period Weeks   Status New     PT SHORT TERM GOAL #4   Title Pt will be independent with  sit<>stand transfer, no UE support, for improved transfer efficiency and safety.   Time 5   Period Weeks   Status New     PT SHORT TERM GOAL #5   Title Pt will verbalize understanding of CVA education.   Time 5   Period Weeks   Status New           PT Long Term Goals - 04/20/16 2101      PT LONG TERM GOAL #1   Title Pt will verbalize understanding of fall prevention in home environment.  TARGET:  06/19/16   Time 9   Period Weeks   Status New     PT LONG TERM GOAL #2   Title Pt will improve TUG score to less than or equal to 15 seconds for decreased fall risk.   Time 9   Period Weeks   Status New     PT LONG TERM GOAL #3   Title Pt will improve DGI score to at least 19/24 for decreased fall risk.   Time 9   Period Weeks   Status New     PT LONG TERM GOAL #4   Title Pt will improve gait velocity to at least 2 ft/sec for improved gait efficiency and safety.   Time 9   Period Weeks   Status New     PT LONG TERM GOAL #5   Title Pt will ambulate at least 1000 ft, indoor and outdoor surfaces, with single point cane versus no assistive device, for improved community ambulation.   Time 9   Period Weeks   Status New               Plan - 04/29/16 1217    Clinical Impression Statement Session focused on adding balance exercises to her HEP. See pt instructions for details.    Rehab Potential Good   PT Frequency 2x / week  1x/wk for 1 week, then   PT Duration 8 weeks   PT Treatment/Interventions ADLs/Self Care Home Management;Functional mobility training;Gait training;Stair training;DME Instruction;Therapeutic activities;Therapeutic exercise;Balance training;Neuromuscular re-education;Patient/family education;Orthotic Fit/Training   PT Next Visit Plan  check HEP; gait training for improved foot clearance and heelstrike on LLE; balance; strengthening   Consulted and Agree with Plan of Care Patient   Family Member Consulted --      Patient will benefit from skilled  therapeutic intervention in order to improve the following deficits and impairments:  Abnormal gait, Decreased activity tolerance, Decreased balance, Decreased mobility, Decreased strength, Difficulty walking  Visit Diagnosis: Other abnormalities of gait and mobility  Muscle weakness (  generalized)  Unsteadiness on feet     Problem List Patient Active Problem List   Diagnosis Date Noted  . 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 Vision loss of right eye   . Exertional shortness of breath   . Exertional chest pain   . History of cerebral hemorrhage   . OSA (obstructive sleep apnea) 06/19/2015  . Cytotoxic brain edema ([place]) 05/05/2015  . HTN (hypertension), malignant   . HLD (hyperlipidemia)   . Morbid obesity due to excess calories ([place])   . Intraparenchymal hematoma of brain ([place]) 04/30/2015  . ICH (intracerebral hemorrhage) ([place]) 04/30/2015  . Hypertensive emergency   . Hyperlipidemia   . Obesity   . AKI (acute kidney injury) ([place])   . Hypokalemia     Rexanne Mano, PT 04/29/2016, 12:31 PM  [facility] [address] [place], [place], [address] Phone: [phone]   Fax:  [phone]  Name: Wania Longstreth MRN: [id number] Date of Birth: 1975-08-11

## 2016-04-30 ENCOUNTER — Ambulatory Visit: Payer: PRIVATE HEALTH INSURANCE | Admitting: Physical Therapy

## 2016-04-30 DIAGNOSIS — R2689 Other abnormalities of gait and mobility: Secondary | ICD-10-CM | POA: Diagnosis not present

## 2016-04-30 DIAGNOSIS — R2681 Unsteadiness on feet: Secondary | ICD-10-CM

## 2016-04-30 DIAGNOSIS — M6281 Muscle weakness (generalized): Secondary | ICD-10-CM

## 2016-04-30 NOTE — Patient Instructions (Addendum)
KNEE: Extension, Long Arc Quads - Sitting    Raise leg until knee is straight. __10_ reps per set, _1-2__ sets per day  Copyright  VHI. All rights reserved.  KNEE: Flexion / Extension - Sitting    Sit at edge of surface, foot on towel or pillowcase. Bend and straighten knee. __5-10_ reps per set, __1-2_ sets per day.  Copyright  VHI. All rights reserved.

## 2016-04-30 NOTE — Therapy (Signed)
[facility] [address] [place], [place], [address] Phone: [phone]   Fax:  [phone]  Physical Therapy Treatment  Patient Details  Name: Belinda Ramirez MRN: [id number] Date of Birth: 1975/05/29 Referring Provider: Waymon Budge Erlinda Hong, [name])  Encounter Date: 04/30/2016      PT End of Session - 04/30/16 1238    Visit Number 4   Number of Visits 18   Date for PT Re-Evaluation 06/19/16   Authorization Type [facility] EFF-30 visit PT limit; no auth required   Authorization - Visit Number 4   Authorization - Number of Visits 30   PT Start Time 0804   PT Stop Time 1660   PT Time Calculation (min) 40 min   Activity Tolerance Patient tolerated treatment well   Behavior During Therapy [facility] for tasks assessed/performed      Past Medical History:  Diagnosis Date  . High cholesterol   . Hypertension   . Obesity   . Sickle cell trait ([place])   . Stroke [facility])     Past Surgical History:  Procedure Laterality Date  . TUBAL LIGATION      There were no vitals filed for this visit.      Subjective Assessment - 04/30/16 0807    Subjective No more episodes of L knee locking since the other night.  Went to walk with husband at park yesterday.   Patient is accompained by: Family member   Pertinent History L ICH 04/2015, HTN, obesity (see full PMH in EPIC)   Patient Stated Goals To be able to walk straight again and to not stumble around as much.   Currently in Pain? No/denies                   Therapeutic Exercise:       [facility] Adult PT Treatment/Exercise - 04/30/16 0001      Exercises   Exercises Knee/Hip     Knee/Hip Exercises: Seated   Long Arc Quad AROM;Strengthening;Left;Right;1 set;10 reps   Heel Slides AROM;Strengthening;Right;Left;1 set;5 reps   Heel Slides Limitations Attempted with red theraband, but pt has difficulty due to weakness        Neuro Re-education: Discussed and  implemented use of visual targets and minimal UE support as needed, with gradual progression away from UE support as able improve stability with balance activity.      Balance Exercises - 04/30/16 0808      Balance Exercises: Standing   Standing Eyes Closed Wide (BOA);Narrow base of support (BOS);Foam/compliant surface;1 rep;30 secs;Head turns  Head nods/turns x 10 reps EC   SLS Eyes open;Solid surface;Upper extremity support 2;10 secs;2 reps   Partial Tandem Stance Eyes closed;Foam/compliant surface;Upper extremity support 2;1 rep;30 secs  Head turns x 10, head nods x 10   Sidestepping Upper extremity support;3 reps  10 ft length of counter, each direction   Other Standing Exercises Reviewed standing exercises provided as HEP yesterday, with min cues given for visual targets and intermittent use of UE for improved stability.           PT Education - 04/30/16 1147    Education provided Yes   Education Details Review of HEP-use of hand support and visual cues for improved stability; added LAQ and seated heelslides   Person(s) Educated Patient   Methods Explanation;Demonstration;Verbal cues   Comprehension Verbalized understanding;Returned demonstration;Need further instruction          PT Short Term Goals - 04/20/16 1432  PT SHORT TERM GOAL #1   Title Pt will be independent with HEP for improved strengthl, balance, and gait.  TARGET 05/20/16   Time 5   Period Weeks   Status New     PT SHORT TERM GOAL #2   Title Pt will improve TUG score to less than or equal to 20 seconds for decreased fall risk.   Time 5   Period Weeks   Status New     PT SHORT TERM GOAL #3   Title Pt will improve DGI score to at least 14/24 for decreased fall risk.   Time 5   Period Weeks   Status New     PT SHORT TERM GOAL #4   Title Pt will be independent with sit<>stand transfer, no UE support, for improved transfer efficiency and safety.   Time 5   Period Weeks   Status New     PT  SHORT TERM GOAL #5   Title Pt will verbalize understanding of CVA education.   Time 5   Period Weeks   Status New           PT Long Term Goals - 04/20/16 2101      PT LONG TERM GOAL #1   Title Pt will verbalize understanding of fall prevention in home environment.  TARGET:  06/19/16   Time 9   Period Weeks   Status New     PT LONG TERM GOAL #2   Title Pt will improve TUG score to less than or equal to 15 seconds for decreased fall risk.   Time 9   Period Weeks   Status New     PT LONG TERM GOAL #3   Title Pt will improve DGI score to at least 19/24 for decreased fall risk.   Time 9   Period Weeks   Status New     PT LONG TERM GOAL #4   Title Pt will improve gait velocity to at least 2 ft/sec for improved gait efficiency and safety.   Time 9   Period Weeks   Status New     PT LONG TERM GOAL #5   Title Pt will ambulate at least 1000 ft, indoor and outdoor surfaces, with single point cane versus no assistive device, for improved community ambulation.   Time 9   Period Weeks   Status New               Plan - 04/30/16 1240    Clinical Impression Statement Skilled session focused on review of standing balance exercises, which pt performs with increased ease when using minimal UE support and cues for visualizing target.  Pt's LLE becomes visibly fatigued and shaky during standing sidestepping exercises.  Additional seated quad and hamstring exercises added to address lower extremity weakness.   Rehab Potential Good   PT Frequency 2x / week  1x/wk for 1 week, then   PT Duration 8 weeks   PT Treatment/Interventions ADLs/Self Care Home Management;Functional mobility training;Gait training;Stair training;DME Instruction;Therapeutic activities;Therapeutic exercise;Balance training;Neuromuscular re-education;Patient/family education;Orthotic Fit/Training   PT Next Visit Plan Continue to work on [place], balance activities at counter, gait activities   Consulted and  Agree with Plan of Care Patient      Patient will benefit from skilled therapeutic intervention in order to improve the following deficits and impairments:  Abnormal gait, Decreased activity tolerance, Decreased balance, Decreased mobility, Decreased strength, Difficulty walking  Visit Diagnosis: Unsteadiness on feet  Muscle weakness (generalized)  Problem List Patient Active Problem List   Diagnosis Date Noted  . 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 Vision loss of right eye   . Exertional shortness of breath   . Exertional chest pain   . History of cerebral hemorrhage   . OSA (obstructive sleep apnea) 06/19/2015  . Cytotoxic brain edema ([place]) 05/05/2015  . HTN (hypertension), malignant   . HLD (hyperlipidemia)   . Morbid obesity due to excess calories ([place])   . Intraparenchymal hematoma of brain ([place]) 04/30/2015  . ICH (intracerebral hemorrhage) ([employer]) 04/30/2015  . Hypertensive emergency   . Hyperlipidemia   . Obesity   . AKI (acute kidney injury) ([place])   . Hypokalemia     Shaiden Aldous W. 04/30/2016, 12:43 PM Frazier Butt., PT [facility] [address] [place], [place], [address] Phone: [phone]   Fax:  [phone]  Name: Belinda Ramirez MRN: [id number] Date of Birth: [birth year]

## 2016-05-05 ENCOUNTER — Ambulatory Visit: Payer: PRIVATE HEALTH INSURANCE | Admitting: Physical Therapy

## 2016-05-07 ENCOUNTER — Encounter: Payer: Self-pay | Admitting: Physical Therapy

## 2016-05-07 ENCOUNTER — Ambulatory Visit: Payer: PRIVATE HEALTH INSURANCE | Admitting: Physical Therapy

## 2016-05-07 VITALS — BP 115/85 | HR 82

## 2016-05-07 DIAGNOSIS — M6281 Muscle weakness (generalized): Secondary | ICD-10-CM

## 2016-05-07 DIAGNOSIS — R2681 Unsteadiness on feet: Secondary | ICD-10-CM

## 2016-05-07 DIAGNOSIS — R2689 Other abnormalities of gait and mobility: Secondary | ICD-10-CM | POA: Diagnosis not present

## 2016-05-07 NOTE — Therapy (Signed)
[facility] [address] [place], [place], [address] Phone: [phone]   Fax:  [phone]  Physical Therapy Treatment  Patient Details  Name: Belinda Ramirez MRN: [id number] Date of Birth: 1975-07-14 Referring Provider: Waymon Budge Erlinda Hong, [name])  Encounter Date: 05/07/2016      PT End of Session - 05/07/16 2054    Visit Number 5   Number of Visits 18   Date for PT Re-Evaluation 06/19/16   Authorization Type [facility] EFF-30 visit PT limit; no auth required   Authorization - Visit Number 5   Authorization - Number of Visits 30   PT Start Time 1200  arrived 15 min late   PT Stop Time 1230   PT Time Calculation (min) 30 min   Equipment Utilized During Treatment Gait belt   Activity Tolerance Patient tolerated treatment well   Behavior During Therapy [facility] for tasks assessed/performed      Past Medical History:  Diagnosis Date  . High cholesterol   . Hypertension   . Obesity   . Sickle cell trait ([place])   . Stroke [facility])     Past Surgical History:  Procedure Laterality Date  . TUBAL LIGATION      Vitals:   05/07/16 1203  BP: 115/85  Pulse: 82        Subjective Assessment - 05/07/16 1202    Subjective HEP is going well. Partner is "coaching" her well and doing at least one time per day.    Patient is accompained by: Family member   Pertinent History L ICH 04/2015, HTN, obesity (see full PMH in EPIC)   Patient Stated Goals To be able to walk straight again and to not stumble around as much.   Currently in Pain? No/denies                         [facility] Adult PT Treatment/Exercise - 05/07/16 0001      Transfers   Transfers Sit to Stand;Stand to Sit   Sit to Stand 6: Modified independent (Device/Increase time);Without upper extremity assist   Stand to Sit 6: Modified independent (Device/Increase time);To chair/3-in-1;Without upper extremity assist     Ambulation/Gait   Ambulation/Gait Assistance 5: Supervision;4: Min guard   Ambulation/Gait Assistance Details supervision with cane; close guard without cane as pt appears cautious/anxious, however no loss of balance noted; vc for incr velocity   Ambulation Distance (Feet) 100 Feet  120   Assistive device Straight cane;None   Gait Pattern Step-through pattern;Decreased step length - right;Decreased step length - left;Decreased dorsiflexion - left;Poor foot clearance - left;Poor foot clearance - right   Ambulation Surface Level;Indoor;Unlevel     Knee/Hip Exercises: Standing   Functional Squat 1 set;10 reps   Functional Squat Limitations light bil UE support on counter     Knee/Hip Exercises: Seated   Long Arc Quad AROM;Strengthening;Left;Right;1 set;10 reps   Hamstring Curl Strengthening;Both;1 set;10 reps  red band             Balance Exercises - 05/07/16 2051      Balance Exercises: Standing   Standing Eyes Opened Wide ([place]);Foam/compliant surface;1 rep;30 secs;Narrow base of support (BOS)   Standing Eyes Closed Wide (BOA);Foam/compliant surface;3 reps;30 secs  blue mat   Tandem Stance Eyes open;Eyes closed;Foam/compliant surface;2 reps;30 secs  blue mat   Stepping Strategy Posterior;Foam/compliant surface;10 reps  without cane   Retro Gait Foam/compliant surface;2 reps  length blue mat with cane  PT Education - 05/07/16 2054    Education provided Yes   Education Details reviewed HEP   Person(s) Educated Patient   Methods Explanation;Demonstration   Comprehension Verbalized understanding;Returned demonstration          PT Short Term Goals - 04/20/16 1432      PT SHORT TERM GOAL #1   Title Pt will be independent with HEP for improved strengthl, balance, and gait.  TARGET 05/20/16   Time 5   Period Weeks   Status New     PT SHORT TERM GOAL #2   Title Pt will improve TUG score to less than or equal to 20 seconds for decreased fall risk.   Time 5   Period Weeks    Status New     PT SHORT TERM GOAL #3   Title Pt will improve DGI score to at least 14/24 for decreased fall risk.   Time 5   Period Weeks   Status New     PT SHORT TERM GOAL #4   Title Pt will be independent with sit<>stand transfer, no UE support, for improved transfer efficiency and safety.   Time 5   Period Weeks   Status New     PT SHORT TERM GOAL #5   Title Pt will verbalize understanding of CVA education.   Time 5   Period Weeks   Status New           PT Long Term Goals - 04/20/16 2101      PT LONG TERM GOAL #1   Title Pt will verbalize understanding of fall prevention in home environment.  TARGET:  06/19/16   Time 9   Period Weeks   Status New     PT LONG TERM GOAL #2   Title Pt will improve TUG score to less than or equal to 15 seconds for decreased fall risk.   Time 9   Period Weeks   Status New     PT LONG TERM GOAL #3   Title Pt will improve DGI score to at least 19/24 for decreased fall risk.   Time 9   Period Weeks   Status New     PT LONG TERM GOAL #4   Title Pt will improve gait velocity to at least 2 ft/sec for improved gait efficiency and safety.   Time 9   Period Weeks   Status New     PT LONG TERM GOAL #5   Title Pt will ambulate at least 1000 ft, indoor and outdoor surfaces, with single point cane versus no assistive device, for improved community ambulation.   Time 9   Period Weeks   Status New               Plan - 05/07/16 2055    Clinical Impression Statement Shortened session (due to pt's late arrival) focused on standing balance, LE strengthening, HEP review for accuracy, and gait training. Patient continues to progress towards goals, however likely slower progress due to pt's incr caution with fear of falling again.    Rehab Potential Good   PT Frequency 2x / week  1x/wk for 1 week, then   PT Duration 8 weeks   PT Treatment/Interventions ADLs/Self Care Home Management;Functional mobility training;Gait training;Stair  training;DME Instruction;Therapeutic activities;Therapeutic exercise;Balance training;Neuromuscular re-education;Patient/family education;Orthotic Fit/Training   PT Next Visit Plan Wean away from cane during PT or UE support (she does well without it, yet does not have confidence); Continue to work on [place], balance activities  at counter, gait activities   Consulted and Agree with Plan of Care Patient      Patient will benefit from skilled therapeutic intervention in order to improve the following deficits and impairments:  Abnormal gait, Decreased activity tolerance, Decreased balance, Decreased mobility, Decreased strength, Difficulty walking  Visit Diagnosis: Unsteadiness on feet  Muscle weakness (generalized)  Other abnormalities of gait and mobility     Problem List Patient Active Problem List   Diagnosis Date Noted  . 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 Vision loss of right eye   . Exertional shortness of breath   . Exertional chest pain   . History of cerebral hemorrhage   . OSA (obstructive sleep apnea) 06/19/2015  . Cytotoxic brain edema ([place]) 05/05/2015  . HTN (hypertension), malignant   . HLD (hyperlipidemia)   . Morbid obesity due to excess calories ([place])   . Intraparenchymal hematoma of brain ([place]) 04/30/2015  . ICH (intracerebral hemorrhage) ([place]) 04/30/2015  . Hypertensive emergency   . Hyperlipidemia   . Obesity   . AKI (acute kidney injury) ([place])   . Hypokalemia     Rexanne Mano, PT 05/07/2016, 8:58 PM  [facility] [address] [place], [place], [address] Phone: [phone]   Fax:  [phone]  Name: Belinda Ramirez MRN: [id number] Date of Birth: Jun 25, 1975

## 2016-05-11 ENCOUNTER — Ambulatory Visit: Payer: PRIVATE HEALTH INSURANCE | Admitting: Physical Therapy

## 2016-05-13 ENCOUNTER — Ambulatory Visit: Payer: PRIVATE HEALTH INSURANCE | Admitting: Physical Therapy

## 2016-05-17 ENCOUNTER — Ambulatory Visit: Payer: PRIVATE HEALTH INSURANCE | Attending: Oncology | Admitting: Physical Therapy

## 2016-05-17 ENCOUNTER — Other Ambulatory Visit: Payer: Self-pay | Admitting: Internal Medicine

## 2016-05-17 ENCOUNTER — Telehealth: Payer: Self-pay | Admitting: Physical Therapy

## 2016-05-17 NOTE — Telephone Encounter (Signed)
Left message for Belinda Ramirez due to recent missed visits. Reminded pt of last visit scheduled for 4/5 @ 3:30 and encouraged her to call if she cannot keep that appt.  Barry Brunner, PT

## 2016-05-20 ENCOUNTER — Ambulatory Visit: Payer: PRIVATE HEALTH INSURANCE | Attending: Oncology | Admitting: Physical Therapy

## 2016-05-20 ENCOUNTER — Encounter: Payer: Self-pay | Admitting: Physical Therapy

## 2016-05-20 DIAGNOSIS — R2681 Unsteadiness on feet: Secondary | ICD-10-CM

## 2016-05-20 DIAGNOSIS — M6281 Muscle weakness (generalized): Secondary | ICD-10-CM | POA: Diagnosis present

## 2016-05-20 DIAGNOSIS — R2689 Other abnormalities of gait and mobility: Secondary | ICD-10-CM | POA: Insufficient documentation

## 2016-05-20 NOTE — Therapy (Addendum)
[facility] [address] [place], [place], [address] Phone: [phone]   Fax:  [phone]  Physical Therapy Treatment  Patient Details  Name: Belinda Ramirez MRN: [id number] Date of Birth: 1975-03-13 Referring Provider: Waymon Budge Erlinda Hong, [name])  Encounter Date: 05/20/2016      PT End of Session - 05/20/16 1307    Visit Number 6   Number of Visits 18   Date for PT Re-Evaluation 06/19/16   Authorization Type [facility] EFF-30 visit PT limit; no auth required   Authorization - Visit Number 6   Authorization - Number of Visits 30   PT Start Time 0805  pt arrived late   PT Stop Time 0845   PT Time Calculation (min) 40 min   Equipment Utilized During Treatment Gait belt   Activity Tolerance Patient tolerated treatment well   Behavior During Therapy WFL for tasks assessed/performed      Past Medical History:  Diagnosis Date  . High cholesterol   . Hypertension   . Obesity   . Sickle cell trait ([place])   . Stroke [facility])     Past Surgical History:  Procedure Laterality Date  . TUBAL LIGATION      There were no vitals filed for this visit.      Subjective Assessment - 05/20/16 1301    Subjective Reports she is doing well. Went to park to fly a Building surveyor (with her partner) and was able to step in all directions and maintain balance without the cane.   Patient is accompained by: Family member   Pertinent History L ICH 04/2015, HTN, obesity (see full PMH in EPIC)   Patient Stated Goals To be able to walk straight again and to not stumble around as much.   Currently in Pain? No/denies                         [facility] Adult PT Treatment/Exercise - 05/20/16 0813      Transfers   Transfers Sit to Stand;Stand to Sit   Sit to Stand 6: Modified independent (Device/Increase time);Without upper extremity assist   Stand to Sit 6: Modified independent (Device/Increase time);To chair/3-in-1;Without upper  extremity assist   Number of Reps 10 reps;1 set     Ambulation/Gait   Ambulation/Gait Assistance 5: Supervision   Ambulation/Gait Assistance Details for safety; pt confidemce   Ambulation Distance (Feet) 75 Feet  100   Assistive device None   Gait Pattern Step-through pattern;Decreased stride length;Wide base of support   Ambulation Surface Level;Indoor     Standardized Balance Assessment   Standardized Balance Assessment Timed Up and Go Test;Dynamic Gait Index     Dynamic Gait Index   Level Surface Mild Impairment   Change in Gait Speed Mild Impairment  7.16   Gait with Horizontal Head Turns Normal   Gait with Vertical Head Turns Normal   Gait and Pivot Turn Normal   Step Over Obstacle Mild Impairment   Step Around Obstacles Normal   Steps Moderate Impairment   Total Score 19     Timed Up and Go Test   TUG Normal TUG   Normal TUG (seconds) 13.16     High Level Balance   High Level Balance Comments forward and backward stepping strategy on ramp (facing up and down) x 10 each leg                PT Education - 05/20/16 1306    Education provided Yes  Education Details reviewed (and re-printed) HEP as pt did not recall that sit to stand was part of her HEP; pt able to independently state s/s of CVA and appropriate actions to take if having symptoms   Person(s) Educated Patient;Other (comment)  partner   Methods Explanation;Handout;Demonstration   Comprehension Verbalized understanding;Returned demonstration          PT Short Term Goals - 05/20/16 1311      PT SHORT TERM GOAL #1   Title Pt will be independent with HEP for improved strengthl, balance, and gait.  TARGET 05/20/16   Baseline 4/5 re-printed HEP as pt was missing some ex's   Time 5   Period Weeks   Status On-going     PT SHORT TERM GOAL #2   Title Pt will improve TUG score to less than or equal to 20 seconds for decreased fall risk.  4/5 13.16 sec   Time 5   Period Weeks   Status Achieved      PT SHORT TERM GOAL #3   Title Pt will improve DGI score to at least 14/24 for decreased fall risk.  05/20/16 19/24   Time 5   Period Weeks   Status Achieved     PT SHORT TERM GOAL #4   Title Pt will be independent with sit<>stand transfer, no UE support, for improved transfer efficiency and safety.   Time 5   Period Weeks   Status Achieved     PT SHORT TERM GOAL #5   Title Pt will verbalize understanding of CVA education.   Time 5   Period Weeks   Status Achieved           PT Long Term Goals - 04/20/16 2101      PT LONG TERM GOAL #1   Title Pt will verbalize understanding of fall prevention in home environment.  TARGET:  06/19/16   Time 9   Period Weeks   Status New     PT LONG TERM GOAL #2   Title Pt will improve TUG score to less than or equal to 15 seconds for decreased fall risk.   Time 9   Period Weeks   Status New     PT LONG TERM GOAL #3   Title Pt will improve DGI score to at least 19/24 for decreased fall risk.   Time 9   Period Weeks   Status New     PT LONG TERM GOAL #4   Title Pt will improve gait velocity to at least 2 ft/sec for improved gait efficiency and safety.   Time 9   Period Weeks   Status New     PT LONG TERM GOAL #5   Title Pt will ambulate at least 1000 ft, indoor and outdoor surfaces, with single point cane versus no assistive device, for improved community ambulation.   Time 9   Period Weeks   Status New               Plan - 05/20/16 1311    Clinical Impression Statement STGs reassessed with pt meeting 4 of 5 goals and partially meeting 5th goal with plan to continue to work on this goal (independence with HEP). Patient has made excellent progress with improved balance and gait. She has progressed to taking the [facility] with her when she goes out, but does not typically use or need it.    Rehab Potential Good   PT Frequency 2x / week  1x/wk for 1 week, then  PT Duration 8 weeks   PT Treatment/Interventions ADLs/Self Care Home  Management;Functional mobility training;Gait training;Stair training;DME Instruction;Therapeutic activities;Therapeutic exercise;Balance training;Neuromuscular re-education;Patient/family education;Orthotic Fit/Training   PT Next Visit Plan OOPs, Jeani Hawking had her schedule 6 more weeks when should have been 4 (thru 09/22/66 cert date)--notify her and cancel last 2 weeks of scheduled appts; Wean away from cane or UE support (she does well without it, yet does not have confidence); Continue to work on [place], balance activities at counter, gait activities   Consulted and Agree with Plan of Care Patient      Patient will benefit from skilled therapeutic intervention in order to improve the following deficits and impairments:  Abnormal gait, Decreased activity tolerance, Decreased balance, Decreased mobility, Decreased strength, Difficulty walking  Visit Diagnosis: Unsteadiness on feet  Muscle weakness (generalized)  Other abnormalities of gait and mobility     Problem List Patient Active Problem List   Diagnosis Date Noted  . 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 Vision loss of right eye   . Exertional shortness of breath   . Exertional chest pain   . History of cerebral hemorrhage   . OSA (obstructive sleep apnea) 06/19/2015  . 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 HTN (hypertension), malignant   . HLD (hyperlipidemia)   . Morbid obesity due to excess calories ([place])   . Intraparenchymal hematoma of brain ([place]) 04/30/2015  . ICH (intracerebral hemorrhage) ([place]) 04/30/2015  . Hypertensive emergency   . Hyperlipidemia   . Obesity   . AKI (acute kidney injury) ([place])   . Hypokalemia    PHYSICAL THERAPY DISCHARGE SUMMARY  (addendum 06/08/16 due to has not returned to clinic since 05/20/16)  Visits from Start of Care: 6  Current functional level related to goals / functional outcomes: See above    Remaining deficits: Decreased balance related to prior CVA    Education / Equipment: HEP, safe use of DME  Plan:                                                    Patient goals were partially met. Patient is being discharged due to not returning since the last visit.  ?????   *Attempted to contact patient via phone 4/20 (front office and supervisor left multiple messages) and 4/24. Left message re: discharge from PT due to not returning to clinic with future appointments cancelled.          Rexanne Mano, PT 05/20/2016, 8:37 PM  [facility] [address] [place], [place], [address] Phone: [phone]   Fax:  [phone]  Name: Swayze Kozuch MRN: [id number] Date of Birth: 1975-10-25

## 2016-05-24 ENCOUNTER — Ambulatory Visit: Payer: Self-pay | Admitting: Physical Therapy

## 2016-05-25 ENCOUNTER — Ambulatory Visit: Payer: PRIVATE HEALTH INSURANCE | Admitting: Physical Therapy

## 2016-05-31 ENCOUNTER — Ambulatory Visit: Payer: PRIVATE HEALTH INSURANCE | Admitting: Physical Therapy

## 2016-06-01 ENCOUNTER — Ambulatory Visit: Payer: Self-pay | Admitting: Physical Therapy

## 2016-06-07 ENCOUNTER — Ambulatory Visit: Payer: PRIVATE HEALTH INSURANCE | Admitting: Physical Therapy

## 2016-06-08 ENCOUNTER — Ambulatory Visit: Payer: PRIVATE HEALTH INSURANCE | Admitting: Physical Therapy

## 2016-06-14 ENCOUNTER — Ambulatory Visit: Payer: Self-pay | Admitting: Physical Therapy

## 2016-06-15 ENCOUNTER — Ambulatory Visit: Payer: Self-pay | Admitting: Physical Therapy

## 2016-06-21 ENCOUNTER — Ambulatory Visit: Payer: Self-pay | Admitting: Physical Therapy

## 2016-06-22 ENCOUNTER — Ambulatory Visit: Payer: Self-pay | Admitting: Physical Therapy

## 2016-06-28 ENCOUNTER — Ambulatory Visit: Payer: Self-pay | Admitting: Physical Therapy

## 2016-06-29 ENCOUNTER — Ambulatory Visit: Payer: Self-pay | Admitting: Physical Therapy

## 2016-07-30 ENCOUNTER — Encounter (HOSPITAL_COMMUNITY): Payer: Self-pay | Admitting: *Deleted

## 2016-07-30 ENCOUNTER — Ambulatory Visit (HOSPITAL_COMMUNITY)
Admission: EM | Admit: 2016-07-30 | Discharge: 2016-07-30 | Disposition: A | Discharge status: Home / Self Care | Payer: PRIVATE HEALTH INSURANCE | Attending: Internal Medicine | Admitting: Internal Medicine

## 2016-07-30 DIAGNOSIS — R0981 Nasal congestion: Secondary | ICD-10-CM

## 2016-07-30 DIAGNOSIS — I10 Essential (primary) hypertension: Secondary | ICD-10-CM

## 2016-07-30 DIAGNOSIS — R040 Epistaxis: Secondary | ICD-10-CM | POA: Diagnosis not present

## 2016-07-30 MED ORDER — CLONIDINE HCL 0.1 MG PO TABS
ORAL_TABLET | ORAL | Status: AC
  Filled 2016-07-30: qty 1

## 2016-07-30 MED ORDER — CLONIDINE HCL 0.1 MG PO TABS
0.1000 mg | ORAL_TABLET | Freq: Once | ORAL | Status: AC
  Administered 2016-07-30: 0.1 mg via ORAL

## 2016-07-30 NOTE — ED Triage Notes (Signed)
Pt  Developed  nosebleed  This   Am  approx  5 am        Has   Subsided   Somewhat         Pt  Also  Reports she  Coughed  Up  Some   As   Well        No  Pain      bp  Was  Higher  Earlier

## 2016-07-30 NOTE — Discharge Instructions (Addendum)
Avoid blowing nose for the next several days, to allow injured blood vessel to clot/heal.  Can use afrin spray otc for 2-3 days if needed for very severe congestion.  If nose bleeds again, ice for 5-10 minutes or firm pressure (without letting up) for 5-10 minutes or a couple squirts of afrin will help stop bleeding.   Blood pressure may be elevated today because of upset from nose bleed.  A dose of clonidine 0.1 mg was given today at urgent care to help with blood pressure.  No danger signs in history or on exam today.   Followup with your primary care provider in the next several days to recheck blood pressure and see if med adjustment is needed.

## 2016-07-30 NOTE — ED Provider Notes (Signed)
[facility]    CSN: [id number] Arrival date & time: 07/30/16  1353     History   Chief Complaint Chief Complaint  Patient presents with  . Epistaxis    HPI Belinda Ramirez is a 41 y.o. female. She has a history of hypertension requiring lisinopril and clonidine patch. Was on some additional meds, amlodipine and HCTZ, but has been off these since about 04/2016. Blew her nose earlier this morning and has had a left nosebleed on/off ever since. Her nose feels stuffy, and every time she blows it today it rebleeds. No headache, no chest discomfort, no unusual leg swelling. Does not feel breathless although her companion thinks her breathing looks a little bit labored today.    HPI  Past Medical History:  Diagnosis Date  . High cholesterol   . Hypertension   . Obesity   . Sickle cell trait ([place])   . Stroke [facility])     Patient Active Problem List   Diagnosis Date Noted  . 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 Vision loss of right eye   . Exertional shortness of breath   . Exertional chest pain   . History of cerebral hemorrhage   . OSA (obstructive sleep apnea) 06/19/2015  . 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 HTN (hypertension), malignant   . HLD (hyperlipidemia)   . Morbid obesity due to excess calories ([place])   . Intraparenchymal hematoma of brain ([place]) 04/30/2015  . ICH (intracerebral hemorrhage) ([place]) 04/30/2015  . Hypertensive emergency   . Hyperlipidemia   . Obesity   . AKI (acute kidney injury) ([place])   . Hypokalemia     Past Surgical History:  Procedure Laterality Date  . TUBAL LIGATION      Home Medications    Prior to Admission medications   Medication Sig Start Date End Date Taking? Authorizing Provider  atorvastatin (LIPITOR) 40 MG tablet Take 1 tablet (40 mg total) by mouth daily at 6 PM. 05/06/15   Rinehuls, Early Chars, PA-C  cloNIDine (CATAPRES - DOSED IN MG/24 HR) 0.1 mg/24hr patch Place 1 patch (0.1 mg total)  onto the skin once a week. 04/20/16   Holley Raring, MD  lisinopril (PRINIVIL,ZESTRIL) 20 MG tablet Take 1 tablet (20 mg total) by mouth 2 (two) times daily. 03/23/16   Ward, Delice Bison, DO  Multiple Vitamin (MULTIVITAMIN WITH MINERALS) TABS tablet Take 1 tablet by mouth daily.    [provider]    Family History Family History  Problem Relation Age of Onset  . Heart attack Maternal Grandfather   . Diabetes Mother   . Hypertension Mother   . Cancer Father   . Heart disease Maternal Grandmother     Social History Social History  Substance Use Topics  . Smoking status: Never Smoker  . Smokeless tobacco: Never Used  . Alcohol use 0.0 oz/week     Comment: occaisonal     Allergies   Oxycodone; Percocet [oxycodone-acetaminophen]; and Latex   Review of Systems Review of Systems  All other systems reviewed and are negative.    Physical Exam Triage Vital Signs ED Triage Vitals [07/30/16 1416]  Enc Vitals Group     BP (!) 195/105     Pulse Rate 100     Resp 18     Temp 98.6 F (37 C)     Temp Source Oral     SpO2 100 %     Weight      Height  Pain Score      Pain Loc    Updated Vital Signs BP (!) 164/110 (BP Location: Left Arm)   Pulse 100   Temp 98.6 F (37 C) (Oral)   Resp 18   LMP 07/30/2016   SpO2 100%   Physical Exam  Constitutional: She is oriented to person, place, and time. No distress.  HENT:  Head: Atraumatic.  Fresh blood in the floor of the left nare   Moderate congestion in both nares Bilateral TMs are moderately dull, pink tinged  Eyes:  Conjugate gaze observed, no eye redness/discharge  Neck: Neck supple.  Cardiovascular: Normal rate and regular rhythm.   Pulmonary/Chest: No respiratory distress. She has no wheezes. She has no rales.  Lungs clear, symmetric breath sounds  Abdominal: She exhibits no distension.  Musculoskeletal: Normal range of motion.  Trace bilateral peripheral edema in the distal lower extremities, symmetric    Neurological: She is alert and oriented to person, place, and time.  Skin: Skin is warm and dry.  Nursing note and vitals reviewed.    UC Treatments / Results   Procedures Procedures (including critical care time)  Medications Ordered in UC Medications  cloNIDine (CATAPRES) tablet 0.1 mg (0.1 mg Oral Given 07/30/16 1444)   Some improvement in bp 50min after clonidine given.    Final Clinical Impressions(s) / UC Diagnoses   Final diagnoses:  Left-sided epistaxis  Sinus congestion  Elevated blood pressure reading in office with diagnosis of hypertension   Avoid blowing nose for the next several days, to allow injured blood vessel to clot/heal.  Can use afrin spray otc for 2-3 days if needed for very severe congestion.  If nose bleeds again, ice for 5-10 minutes or firm pressure (without letting up) for 5-10 minutes or a couple squirts of afrin will help stop bleeding.   Blood pressure may be elevated today because of upset from nose bleed.  A dose of clonidine 0.1 mg was given today at urgent care to help with blood pressure.  No danger signs in history or on exam today.   Followup with your primary care provider in the next several days to recheck blood pressure and see if med adjustment is needed.      Sherlene Shams, MD 07/30/16 [phone]

## 2016-11-10 ENCOUNTER — Ambulatory Visit (INDEPENDENT_AMBULATORY_CARE_PROVIDER_SITE_OTHER): Payer: Self-pay

## 2016-11-10 ENCOUNTER — Encounter (HOSPITAL_COMMUNITY): Payer: Self-pay | Admitting: *Deleted

## 2016-11-10 ENCOUNTER — Ambulatory Visit (HOSPITAL_COMMUNITY)
Admission: EM | Admit: 2016-11-10 | Discharge: 2016-11-10 | Disposition: A | Discharge status: Home / Self Care | Payer: Self-pay | Attending: Family Medicine | Admitting: Family Medicine

## 2016-11-10 DIAGNOSIS — J181 Lobar pneumonia, unspecified organism: Secondary | ICD-10-CM

## 2016-11-10 DIAGNOSIS — J189 Pneumonia, unspecified organism: Secondary | ICD-10-CM

## 2016-11-10 MED ORDER — AZITHROMYCIN 250 MG PO TABS: 250.0000 mg | ORAL_TABLET | Freq: Every day | ORAL | 0 refills | Status: DC

## 2016-11-10 MED ORDER — BENZONATATE 100 MG PO CAPS: 100.0000 mg | ORAL_CAPSULE | Freq: Three times a day (TID) | ORAL | 0 refills | Status: DC

## 2016-11-10 MED ORDER — FLUTICASONE PROPIONATE 50 MCG/ACT NA SUSP: 2.0000 | Freq: Every day | NASAL | 0 refills | Status: DC

## 2016-11-10 NOTE — ED Triage Notes (Signed)
Dry   Cough   Runny  Nose   And   sorethroat           Pt  Reports   Symptoms  X   2   Weeks

## 2016-11-10 NOTE — ED Provider Notes (Signed)
[facility]    CSN: [id number] Arrival date & time: 11/10/16  1328     History   Chief Complaint Chief Complaint  Patient presents with  . Cough    HPI Belinda Ramirez is a 41 y.o. female.   41 year old female with history of hypertension, hyperlipidemia, stroke, comes in for 2 week history of cough, rhinorrhea, sore throat. Has also had some sneezing and nasal congestion, denies ear pain, eye pain. States cough is wet, but unable to produce the phlegm. Denies fever, chills, night sweats. Shortness of breath and wheezing with coughing, denies chest pain. Denies weakness, dizziness. Denies abdominal pain, nausea, vomiting, diarrhea. No sick contact. Tried sudafed, ibuprofen without relief. History of bronchitis, no history asthma. Never smoker, but was passive smoker growing up. No history of seasonal allergies.       Past Medical History:  Diagnosis Date  . High cholesterol   . Hypertension   . Obesity   . Sickle cell trait ([place])   . Stroke [facility])     Patient Active Problem List   Diagnosis Date Noted  . 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 Vision loss of right eye   . Exertional shortness of breath   . Exertional chest pain   . History of cerebral hemorrhage   . OSA (obstructive sleep apnea) 06/19/2015  . 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 HTN (hypertension), malignant   . HLD (hyperlipidemia)   . Morbid obesity due to excess calories ([place])   . Intraparenchymal hematoma of brain ([place]) 04/30/2015  . ICH (intracerebral hemorrhage) ([place]) 04/30/2015  . Hypertensive emergency   . Hyperlipidemia   . Obesity   . AKI (acute kidney injury) ([place])   . Hypokalemia     Past Surgical History:  Procedure Laterality Date  . TUBAL LIGATION      OB History    No data available       Home Medications    Prior to Admission medications   Medication Sig Start Date End Date Taking? Authorizing Provider  amLODipine (NORVASC) 10  MG tablet Take 1 tablet (10 mg total) by mouth daily. 04/16/16   Holley Raring, MD  atorvastatin (LIPITOR) 40 MG tablet Take 1 tablet (40 mg total) by mouth daily at 6 PM. 05/06/15   Rinehuls, Early Chars, PA-C  azithromycin (ZITHROMAX) 250 MG tablet Take 1 tablet (250 mg total) by mouth daily. Take first 2 tablets together, then 1 every day until finished. 11/10/16   Tasia Catchings, Atha Muradyan V, PA-C  benzonatate (TESSALON) 100 MG capsule Take 1 capsule (100 mg total) by mouth every 8 (eight) hours. 11/10/16   Tasia Catchings, Kemonte Ullman V, PA-C  cloNIDine (CATAPRES - DOSED IN MG/24 HR) 0.1 mg/24hr patch Place 1 patch (0.1 mg total) onto the skin once a week. 04/20/16   Holley Raring, MD  fluticasone (FLONASE) 50 MCG/ACT nasal spray Place 2 sprays into both nostrils daily. 11/10/16   Tasia Catchings, Victory Dresden V, PA-C  hydrochlorothiazide (HYDRODIURIL) 25 MG tablet Take 1 tablet (25 mg total) by mouth daily. 04/16/16   Holley Raring, MD  lisinopril (PRINIVIL,ZESTRIL) 20 MG tablet Take 1 tablet (20 mg total) by mouth 2 (two) times daily. 03/23/16   Ward, Delice Bison, DO  Multiple Vitamin (MULTIVITAMIN WITH MINERALS) TABS tablet Take 1 tablet by mouth daily.    [provider]    Family History Family History  Problem Relation Age of Onset  . Heart attack Maternal Grandfather   . Diabetes Mother   .  Hypertension Mother   . Cancer Father   . Heart disease Maternal Grandmother     Social History Social History  Substance Use Topics  . Smoking status: Never Smoker  . Smokeless tobacco: Never Used  . Alcohol use 0.0 oz/week     Comment: occaisonal     Allergies   Oxycodone; Percocet [oxycodone-acetaminophen]; and Latex   Review of Systems Review of Systems  Reason unable to perform ROS: See HPI as above.     Physical Exam Triage Vital Signs ED Triage Vitals [11/10/16 1436]  Enc Vitals Group     BP (!) 158/97     Pulse Rate 68     Resp 16     Temp 99.2 F (37.3 C)     Temp Source Oral     SpO2 97 %     Weight      Height      Head  Circumference      Peak Flow      Pain Score      Pain Loc      Pain Edu?      Excl. in [place]?    No data found.   Updated Vital Signs BP (!) 158/97   Pulse 68   Temp 99.2 F (37.3 C) (Oral)   Resp 16   LMP 10/27/2016 (Exact Date)   SpO2 97%   Physical Exam  Constitutional: She is oriented to person, place, and time. She appears well-developed and well-nourished. No distress.  HENT:  Head: Normocephalic and atraumatic.  Right Ear: External ear and ear canal normal. Tympanic membrane is erythematous. Tympanic membrane is not bulging.  Left Ear: External ear and ear canal normal. Tympanic membrane is erythematous. Tympanic membrane is not bulging.  Nose: Mucosal edema and rhinorrhea present. Right sinus exhibits no maxillary sinus tenderness and no frontal sinus tenderness. Left sinus exhibits no maxillary sinus tenderness and no frontal sinus tenderness.  Mouth/Throat: Uvula is midline, oropharynx is clear and moist and mucous membranes are normal. No posterior oropharyngeal edema or posterior oropharyngeal erythema.  Eyes: Pupils are equal, round, and reactive to light. Conjunctivae are normal.  Neck: Normal range of motion. Neck supple.  Cardiovascular: Normal rate, regular rhythm and normal heart sounds.  Exam reveals no gallop and no friction rub.   No murmur heard. Pulmonary/Chest: Effort normal and breath sounds normal. She has no decreased breath sounds. She has no wheezes. She has no rhonchi. She has no rales.  Lymphadenopathy:    She has no cervical adenopathy.  Neurological: She is alert and oriented to person, place, and time.  Skin: Skin is warm and dry.  Psychiatric: She has a normal mood and affect. Her behavior is normal. Judgment normal.     UC Treatments / Results  Labs (all labs ordered are listed, but only abnormal results are displayed) Labs Reviewed - No data to display  EKG  EKG Interpretation None       Radiology Dg Chest 2 View  Result Date:  11/10/2016 CLINICAL DATA:  Deep dry cough EXAM: CHEST  2 VIEW COMPARISON:  04/12/2016 FINDINGS: Moderate cardiomegaly. Right middle lobe partial consolidation. No pleural effusion. No pneumothorax. Degenerative changes of the spine. IMPRESSION: 1. Right middle lobe partial consolidation, may reflect atelectasis or mild pneumonia. Radiographic follow-up suggested 2. Moderate cardiomegaly, similar compared to prior radiograph. Electronically Signed   By: Donavan Foil M.D.   On: 11/10/2016 16:00    Procedures Procedures (including critical care time)  Medications Ordered  in UC Medications - No data to display   Initial Impression / Assessment and Plan / UC Course  I have reviewed the triage vital signs and the nursing notes.  Pertinent labs & imaging results that were available during my care of the patient were reviewed by me and considered in my medical decision making (see chart for details).    Discussed x-ray results, possible mild pneumonia at right middle lobe. Given 2 week history of cough, with shortness of breath, will treat for pneumonia with azithromycin. Other symptomatic treatment provided. Patient to follow up with PCP and 1 week for reevaluation. Return precautions given.  Final Clinical Impressions(s) / UC Diagnoses   Final diagnoses:  Community acquired pneumonia of right middle lobe of lung ([place])    New Prescriptions New Prescriptions   AZITHROMYCIN (ZITHROMAX) 250 MG TABLET    Take 1 tablet (250 mg total) by mouth daily. Take first 2 tablets together, then 1 every day until finished.   BENZONATATE (TESSALON) 100 MG CAPSULE    Take 1 capsule (100 mg total) by mouth every 8 (eight) hours.   FLUTICASONE (FLONASE) 50 MCG/ACT NASAL SPRAY    Place 2 sprays into both nostrils daily.      Ok Edwards, PA-C 11/10/16 1620

## 2016-11-10 NOTE — Discharge Instructions (Addendum)
Xray shows possible pneumonia vs atelectasis. Start azithromycin as directed. Tessalon for cough. Start flonase for nasal congestion. You can use over the counter nasal saline rinse such as neti pot for nasal congestion. Keep hydrated, your urine should be clear to pale yellow in color. Tylenol/motrin for fever and pain. Monitor for any worsening of symptoms, chest pain, shortness of breath, wheezing, swelling of the throat, follow up for reevaluation.

## 2016-11-22 ENCOUNTER — Encounter (HOSPITAL_COMMUNITY): Payer: Self-pay | Admitting: Emergency Medicine

## 2016-11-22 ENCOUNTER — Ambulatory Visit (HOSPITAL_COMMUNITY)
Admission: EM | Admit: 2016-11-22 | Discharge: 2016-11-22 | Disposition: A | Discharge status: Home / Self Care | Payer: Self-pay | Attending: Urgent Care | Admitting: Urgent Care

## 2016-11-22 DIAGNOSIS — Z8673 Personal history of transient ischemic attack (TIA), and cerebral infarction without residual deficits: Secondary | ICD-10-CM

## 2016-11-22 DIAGNOSIS — M25562 Pain in left knee: Secondary | ICD-10-CM

## 2016-11-22 DIAGNOSIS — S8002XA Contusion of left knee, initial encounter: Secondary | ICD-10-CM

## 2016-11-22 DIAGNOSIS — I10 Essential (primary) hypertension: Secondary | ICD-10-CM

## 2016-11-22 DIAGNOSIS — W19XXXA Unspecified fall, initial encounter: Secondary | ICD-10-CM

## 2016-11-22 DIAGNOSIS — I160 Hypertensive urgency: Secondary | ICD-10-CM

## 2016-11-22 NOTE — Discharge Instructions (Signed)
You may take 500mg -650mg  Tylenol every 6 hours for pain and inflammation.

## 2016-11-22 NOTE — ED Provider Notes (Signed)
    MRN: [id number] DOB: May 01, 1975  Subjective:   Belinda Ramirez is a 41 y.o. female presenting for chief complaint of Leg Pain  Reports that she fell off the bus leading with her left leg and made impact with her leg. Has a history of weakness in her left leg s/p stroke and wanted to make sure she is okay. Denies swelling, laceration, bruising, redness, warmth. Regarding her BP, patient admits non-compliance with her BP, diet. Denies headache, confusion, dizziness, chest pain, abdominal pain, hematuria.   Belinda Ramirez is allergic to oxycodone; percocet [oxycodone-acetaminophen]; and latex.  Belinda Ramirez  has a past medical history of High cholesterol; Hypertension; Obesity; Sickle cell trait ([place]); and Stroke ([place]). Also  has a past surgical history that includes Tubal ligation.  Objective:   Vitals: BP (!) 226/122 (BP Location: Left Arm)   Pulse 73   Temp 99.1 F (37.3 C) (Oral)   Resp 18   LMP 10/27/2016 (Exact Date)   SpO2 99%   Physical Exam  Constitutional: She is oriented to person, place, and time. She appears well-developed and well-nourished.  Cardiovascular: Normal rate, regular rhythm and intact distal pulses.  Exam reveals no gallop and no friction rub.   No murmur heard. Pulmonary/Chest: No respiratory distress. She has no wheezes. She has no rales.  Musculoskeletal:       Left knee: She exhibits normal range of motion, no swelling, no effusion, no ecchymosis, no deformity, no laceration, no erythema, normal alignment, normal patellar mobility and no bony tenderness. Tenderness (mild) found. Medial joint line, lateral joint line and patellar tendon tenderness noted.  Neurological: She is alert and oriented to person, place, and time.   Assessment and Plan :   Contusion of left knee, initial encounter  Acute pain of left knee  Fall, initial encounter  Essential hypertension  Hypertensive urgency  History of stroke  Will use conservative management for her  knee pain. Emphasized medical compliance for her BP. Strict ER precautions given. Patient is not agreeable to go right now.  Jaynee Eagles, PA-C [facility]  11/22/2016  7:30 PM    Jaynee Eagles, PA-C 11/24/16 2104

## 2016-11-22 NOTE — ED Triage Notes (Addendum)
Pt sts left leg pain since falling while getting off bus last week; pt noted to have htn with hx of same

## 2017-01-22 ENCOUNTER — Emergency Department (HOSPITAL_COMMUNITY)
Admission: EM | Admit: 2017-01-22 | Discharge: 2017-01-23 | Disposition: A | Discharge status: Home / Self Care | Payer: PRIVATE HEALTH INSURANCE | Attending: Emergency Medicine | Admitting: Emergency Medicine

## 2017-01-22 ENCOUNTER — Emergency Department (HOSPITAL_COMMUNITY): Payer: PRIVATE HEALTH INSURANCE

## 2017-01-22 ENCOUNTER — Encounter (HOSPITAL_COMMUNITY): Payer: Self-pay | Admitting: Emergency Medicine

## 2017-01-22 ENCOUNTER — Other Ambulatory Visit: Payer: Self-pay

## 2017-01-22 DIAGNOSIS — N183 Chronic kidney disease, stage 3 unspecified: Secondary | ICD-10-CM | POA: Diagnosis present

## 2017-01-22 DIAGNOSIS — I160 Hypertensive urgency: Secondary | ICD-10-CM | POA: Diagnosis present

## 2017-01-22 DIAGNOSIS — Z91040 Latex allergy status: Secondary | ICD-10-CM | POA: Insufficient documentation

## 2017-01-22 DIAGNOSIS — I129 Hypertensive chronic kidney disease with stage 1 through stage 4 chronic kidney disease, or unspecified chronic kidney disease: Secondary | ICD-10-CM | POA: Insufficient documentation

## 2017-01-22 DIAGNOSIS — Z79899 Other long term (current) drug therapy: Secondary | ICD-10-CM | POA: Insufficient documentation

## 2017-01-22 DIAGNOSIS — G4733 Obstructive sleep apnea (adult) (pediatric): Secondary | ICD-10-CM | POA: Diagnosis present

## 2017-01-22 DIAGNOSIS — Z8679 Personal history of other diseases of the circulatory system: Secondary | ICD-10-CM

## 2017-01-22 DIAGNOSIS — R42 Dizziness and giddiness: Secondary | ICD-10-CM | POA: Diagnosis present

## 2017-01-22 DIAGNOSIS — Z8673 Personal history of transient ischemic attack (TIA), and cerebral infarction without residual deficits: Secondary | ICD-10-CM | POA: Diagnosis not present

## 2017-01-22 DIAGNOSIS — I10 Essential (primary) hypertension: Secondary | ICD-10-CM | POA: Diagnosis present

## 2017-01-22 DIAGNOSIS — D573 Sickle-cell trait: Secondary | ICD-10-CM | POA: Insufficient documentation

## 2017-01-22 LAB — CBC
HEMATOCRIT: 37.9 % (ref 36.0–46.0)
HEMOGLOBIN: 12.2 g/dL (ref 12.0–15.0)
MCH: 24.5 pg — ABNORMAL LOW (ref 26.0–34.0)
MCHC: 32.2 g/dL (ref 30.0–36.0)
MCV: 76.3 fL — ABNORMAL LOW (ref 78.0–100.0)
Platelets: 332 10*3/uL (ref 150–400)
RBC: 4.97 MIL/uL (ref 3.87–5.11)
RDW: 16.7 % — ABNORMAL HIGH (ref 11.5–15.5)
WBC: 9.3 10*3/uL (ref 4.0–10.5)

## 2017-01-22 LAB — BASIC METABOLIC PANEL
ANION GAP: 6 (ref 5–15)
BUN: 13 mg/dL (ref 6–20)
CALCIUM: 8.5 mg/dL — AB (ref 8.9–10.3)
CO2: 27 mmol/L (ref 22–32)
Chloride: 103 mmol/L (ref 101–111)
Creatinine, Ser: 1.65 mg/dL — ABNORMAL HIGH (ref 0.44–1.00)
GFR, EST AFRICAN AMERICAN: 43 mL/min — AB (ref >60)
GFR, EST NON AFRICAN AMERICAN: 37 mL/min — AB (ref >60)
Glucose, Bld: 108 mg/dL — ABNORMAL HIGH (ref 65–99)
POTASSIUM: 3.5 mmol/L (ref 3.5–5.1)
Sodium: 136 mmol/L (ref 135–145)

## 2017-01-22 LAB — I-STAT BETA HCG BLOOD, ED (MC, WL, AP ONLY)

## 2017-01-22 MED ORDER — HYDRALAZINE HCL 20 MG/ML IJ SOLN
10.0000 mg | Freq: Once | INTRAMUSCULAR | Status: AC
  Administered 2017-01-22: 10 mg via INTRAVENOUS
  Filled 2017-01-22: qty 1

## 2017-01-22 MED ORDER — CLONIDINE HCL 0.1 MG/24HR TD PTWK
0.1000 mg | MEDICATED_PATCH | TRANSDERMAL | Status: DC
  Administered 2017-01-22: 0.1 mg via TRANSDERMAL
  Filled 2017-01-22 (×2): qty 1

## 2017-01-22 MED ORDER — PRAZOSIN HCL 1 MG PO CAPS
1.0000 mg | ORAL_CAPSULE | Freq: Three times a day (TID) | ORAL | Status: DC
Start: 2017-01-23 — End: 2017-01-23
  Administered 2017-01-23: 1 mg via ORAL
  Filled 2017-01-22 (×2): qty 1

## 2017-01-22 NOTE — ED Provider Notes (Signed)
[place] EMERGENCY DEPARTMENT Provider Note   CSN: [id number] Arrival date & time: 01/22/17  1431     History   Chief Complaint Chief Complaint  Patient presents with  . Hypertension    HPI Belinda Ramirez is a 41 y.o. female who presents today for evaluation of hypertension.  She has a long-standing history of hypertension and reports that she has been out of her clonidine patch for 2 weeks.  She reports that recently she has been feeling dizzy.  She reports that she has been compliant with all of her blood pressure medicines.  Denies any headaches, or chest pain.  She says that she feels like when she stands up the room is spinning and has a history of is not.  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  She reports that since then she has decreased strength in her left leg and says that is normal.  She reports adherence with dietary plan denies sodium intake.    HPI  Past Medical History:  Diagnosis Date  . High cholesterol   . Hypertension   . Obesity   . Sickle cell trait ([place])   . Stroke [facility])     Patient Active Problem List   Diagnosis Date Noted  . CKD (chronic kidney disease), stage III ([place]) 01/22/2017  . 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 Vision loss of right eye   . Exertional shortness of breath   . Exertional chest pain   . History of cerebral hemorrhage   . OSA (obstructive sleep apnea) 06/19/2015  . 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 Accelerated hypertension   . HLD (hyperlipidemia)   . Morbid obesity due to excess calories ([place])   . Intraparenchymal hematoma of brain ([place]) 04/30/2015  . ICH (intracerebral hemorrhage) ([place]) 04/30/2015  . Hypertensive urgency   . Hyperlipidemia   . Obesity   . AKI (acute kidney injury) ([place])   . Hypokalemia     Past Surgical History:  Procedure Laterality Date  . TUBAL LIGATION      OB History    No data available       Home  Medications    Prior to Admission medications   Medication Sig Start Date End Date Taking? Authorizing Provider  albuterol (PROVENTIL HFA;VENTOLIN HFA) 108 (90 Base) MCG/ACT inhaler Inhale 2 puffs into the lungs every 6 (six) hours as needed for wheezing or shortness of breath.   Yes [provider]  amLODipine (NORVASC) 10 MG tablet Take 1 tablet (10 mg total) by mouth daily. 04/16/16  Yes Holley Raring, MD  cloNIDine (CATAPRES - DOSED IN MG/24 HR) 0.1 mg/24hr patch Place 1 patch (0.1 mg total) onto the skin once a week. 04/20/16  Yes Holley Raring, MD  hydrochlorothiazide (HYDRODIURIL) 25 MG tablet Take 1 tablet (25 mg total) by mouth daily. 04/16/16  Yes Holley Raring, MD  Multiple Vitamins-Minerals (ADULT GUMMY) CHEW Chew 1 tablet by mouth daily.   Yes [provider]  atorvastatin (LIPITOR) 40 MG tablet Take 1 tablet (40 mg total) by mouth daily at 6 PM. Patient not taking: Reported on 01/22/2017 05/06/15   Rinehuls, Early Chars, PA-C  benzonatate (TESSALON) 100 MG capsule Take 1 capsule (100 mg total) by mouth every 8 (eight) hours. Patient not taking: Reported on 01/22/2017 11/10/16   Ok Edwards, PA-C  fluticasone [facility]) 50 MCG/ACT nasal spray Place 2 sprays into both nostrils daily. Patient not taking: Reported on 01/22/2017 11/10/16  Tasia Catchings, Amy V, PA-C  lisinopril (PRINIVIL,ZESTRIL) 20 MG tablet Take 1 tablet (20 mg total) by mouth 2 (two) times daily. Patient not taking: Reported on 01/22/2017 03/23/16   Ward, Delice Bison, DO  prazosin (MINIPRESS) 1 MG capsule Take 1 capsule (1 mg total) by mouth 2 (two) times daily for 10 days. 01/23/17 02/02/17  Lorin Glass, PA-C    Family History Family History  Problem Relation Age of Onset  . Heart attack Maternal Grandfather   . Diabetes Mother   . Hypertension Mother   . Cancer Father   . Heart disease Maternal Grandmother     Social History Social History   Tobacco Use  . Smoking status: Never Smoker  . Smokeless tobacco:  Never Used  Substance Use Topics  . Alcohol use: Yes    Alcohol/week: 0.0 oz    Comment: occaisonal  . Drug use: No     Allergies   Oxycodone; Percocet [oxycodone-acetaminophen]; Aspirin; and Latex   Review of Systems Review of Systems  Constitutional: Negative for chills and fever.  HENT: Negative for ear pain and sore throat.   Eyes: Negative for pain and visual disturbance.  Respiratory: Negative for cough, chest tightness and shortness of breath.   Cardiovascular: Negative for chest pain and palpitations.  Gastrointestinal: Negative for abdominal pain and vomiting.  Genitourinary: Negative for dysuria and hematuria.  Musculoskeletal: Negative for arthralgias and back pain.  Skin: Negative for color change and rash.  Neurological: Positive for dizziness. Negative for seizures, syncope, weakness and headaches.  Psychiatric/Behavioral: Negative for confusion.  All other systems reviewed and are negative.    Physical Exam Updated Vital Signs BP (!) 197/87   Pulse 82   Temp 98.4 F (36.9 C) (Oral)   Resp 20   Ht 5\' 4"  (1.626 m)   Wt 98.4 kg (217 lb)   LMP 01/15/2017   SpO2 97%   BMI 37.25 kg/m   Physical Exam  Constitutional: She is oriented to person, place, and time. She appears well-developed and well-nourished. No distress.  HENT:  Head: Normocephalic and atraumatic.  Mouth/Throat: Oropharynx is clear and moist.  Eyes: Conjunctivae are normal.  Neck: Normal range of motion. Neck supple.  Cardiovascular: Normal rate, regular rhythm, normal heart sounds and intact distal pulses.  No murmur heard. No edema to bilateral lower extremities.  Pulmonary/Chest: Effort normal and breath sounds normal. No respiratory distress.  Abdominal: Soft. Bowel sounds are normal. There is no tenderness.  Musculoskeletal: She exhibits no edema.  Neurological: She is alert and oriented to person, place, and time.  Patient is awake and alert, conversant and able to follow multiple  part commands.  No obvious facial droop.  She denies any visual field changes.  Pupils equal round reactive to light, extraocular motions intact.  Uvula elevates symmetrically, hearing is reportedly normal to voice.  Shoulder shrug symmetrical bilaterally.  5/5 grip strength bilaterally.  Symmetrical strength to bilateral lower extremities.  Sensation intact to bilateral upper and lower extremities.  Skin: Skin is warm and dry.  Psychiatric: She has a normal mood and affect. Her behavior is normal.  Nursing note and vitals reviewed.    ED Treatments / Results  Labs (all labs ordered are listed, but only abnormal results are displayed) Labs Reviewed  BASIC METABOLIC PANEL - Abnormal; Notable for the following components:      Result Value   Glucose, Bld 108 (*)    Creatinine, Ser 1.65 (*)    Calcium 8.5 (*)  GFR calc non Af Amer 37 (*)    GFR calc Af Amer 43 (*)    All other components within normal limits  CBC - Abnormal; Notable for the following components:   MCV 76.3 (*)    MCH 24.5 (*)    RDW 16.7 (*)    All other components within normal limits  METANEPHRINES, PLASMA  I-STAT BETA HCG BLOOD, ED ([facility], [facility], [facility] ONLY)    EKG  EKG Interpretation  Date/Time:  Saturday January 22 2017 15:04:28 EST Ventricular Rate:  76 PR Interval:  184 QRS Duration: 110 QT Interval:  430 QTC Calculation: 483 R Axis:   14 Text Interpretation:  Normal sinus rhythm Possible Left atrial enlargement Incomplete left bundle branch block T wave abnormality, consider lateral ischemia Abnormal ECG No significant change since last tracing Confirmed by Addison Lank [id number]) on 01/23/2017 5:02:43 PM       Radiology Ct Head Wo Contrast  Result Date: 01/22/2017 CLINICAL DATA:  Dizziness. EXAM: CT HEAD WITHOUT CONTRAST TECHNIQUE: Contiguous axial images were obtained from the base of the skull through the vertex without intravenous contrast. COMPARISON:  April 12, 2016 FINDINGS: Brain: No subdural,  epidural, or subarachnoid hemorrhage. Ventricles and sulci are stable. Cerebellum, brainstem, and basal cisterns are stable. Scattered white matter changes are identified. A possible lacunar infarct in the right thalamus is unchanged. No acute cortical ischemia or infarct identified on this study. No mass effect or midline shift. Vascular: No hyperdense vessel or unexpected calcification. Skull: Normal. Negative for fracture or focal lesion. Sinuses/Orbits: No acute finding. Other: None. IMPRESSION: 1. No acute intracranial abnormality identified. Chronic white matter changes as above. Electronically Signed   By: Dorise Bullion III M.D   On: 01/22/2017 18:18    Procedures Procedures (including critical care time)  Medications Ordered in ED Medications  hydrALAZINE (APRESOLINE) injection 10 mg (10 mg Intravenous Given 01/22/17 2236)     Initial Impression / Assessment and Plan / ED Course  I have reviewed the triage vital signs and the nursing notes.  Pertinent labs & imaging results that were available during my care of the patient were reviewed by me and considered in my medical decision making (see chart for details).  Clinical Course as of Jan 23 2053  Sat Jan 22, 2017  2006 Patient re-checked, BP has increased, will give clonidine patch time to work.  No new symptoms.   [EH]  2227 PT re-evaluated, still feels slightly dizzy.  BP 749 systolic.  Will order hydralazine.   [EH]  2258 Spoke with Dr. Myna Hidalgo who does not feel like patient needs admission. He will put in a consult note with BP recommendations, recommends discharge. .   [EH]    Clinical Course User Index [EH] Lorin Glass, PA-C   [name] presents today for evaluation of hypertension and dizziness.  She has been out of her clonidine blood pressure patch for about 2 weeks, however endorses compliance with all other medications.  Labs were obtained and appear consistent with patient's baseline.  CT head obtained  which did not show any acute abnormality.  Her home clonidine patch was ordered and placed.  Her BP elevated, up to 210/107 after patch was placed so she was given hydralazine.  I consulted [facility] for admission as patient's blood pressure is elevated and she is experiencing mild dizziness.  I spoke with Dr. Myna Hidalgo who came and evaluated the patient and does not feel like she requires an admission for her hypertensive  urgency at this time.  He recommended adding prazosin 1 mg twice daily.  He also recommended changing patient's clonidine over from patch to pill as it is muscle it is expensive, however previous notes indicate that she was not compliant with clonidine pills which is why she was placed on the patch.  As the patch was placed here today will work for 1 week I recommended that she obtain follow-up with her PCP during this time, she says that she will be able to follow-up with them, and will defer changing her clonidine to PCP.  As Dr.Opyd does not feel that patient would benefit from an admission for her hypertensive urgency I will discharge her.  She was given strict return precautions, and states her understanding.  This patient was seen as a shared visit with Dr. Rogene Houston who evaluated the patient and agreed with my plan.   Final Clinical Impressions(s) / ED Diagnoses   Final diagnoses:  Hypertension, unspecified type    ED Discharge Orders        Ordered    prazosin (MINIPRESS) 1 MG capsule  2 times daily     01/23/17 0024       Lorin Glass, PA-C 01/23/17 2055    Fredia Sorrow, MD 01/23/17 2244

## 2017-01-22 NOTE — ED Triage Notes (Addendum)
Pt c/o dizziness, started yesterday--has hx of high blood pressure, has been taking meds regularly-- took bp yesterday was 244/ 184, took meds, relaxed, BP came down but still dizzy and lightheaded.  Pt has hx of brain bleed and CVA

## 2017-01-22 NOTE — Consult Note (Signed)
Medical Consultation   Belinda Ramirez  NLG:[id number]  DOB: 27-Jul-1975  DOA: 01/22/2017  PCP: Elisabeth Cara, PA-C   Outpatient Specialists: Rosalin Hawking, MD (neurology)  Requesting physician: Wyn Quaker, PA   Reason for consultation:  Hypertensive urgency    History of Present Illness: Belinda Ramirez is an 41 y.o. female with history of obesity, OSA, chronic kidney disease stage III, and accelerated hypertension and ICH in March 2017 suspected secondary to severe hypertension, now presenting to the emergency department for evaluation of elevated blood pressure and dizziness.  Patient reports that she was dizzy yesterday, checked her blood pressure at home, noted it to be 244/184, took her antihypertensive, relaxed, had a decrease in her BP to near normal, but the dizziness persisted.  Dizziness has since resolved, but blood pressures become elevated again and she seeks evaluation in the ED.  She reports that she has continued her Norvasc, HCTZ, and lisinopril as directed, but has no longer been using the clonidine patch due to prohibitive cost.  In March 2017, at time of her suspected hypertensive ICH, she had negative renal artery ultrasound, echo with preserved EF and grade 1 diastolic dysfunction, normal urine catecholamines, and serum metanephrines were pending at time of discharge.  Her serum epinephrine level has returned to more than 3 times the upper limit of normal.  This is not a very specific finding.  In the ED, basic blood work appears stable and noncontrast head CT is negative for acute intracranial abnormality.  Clonidine patch was placed and she was treated with 10 mg IV hydralazine.  She continues to be asymptomatic and would like to go home.   Review of Systems:  As per HPI otherwise 10 point review of systems negative.   Past Medical History: Past Medical History:  Diagnosis Date  . High cholesterol   . Hypertension   . Obesity   .  Sickle cell trait ([place])   . Stroke [facility])     Past Surgical History: Past Surgical History:  Procedure Laterality Date  . TUBAL LIGATION       Allergies:   Allergies  Allergen Reactions  . Oxycodone Hives  . Percocet [Oxycodone-Acetaminophen] Hives  . Aspirin Other (See Comments)    Hx of hemorrhagic stroke (pt does take low dose aspirin )  . Latex Itching     Social History:  reports that  has never smoked. she has never used smokeless tobacco. She reports that she drinks alcohol. She reports that she does not use drugs.   Family History: Family History  Problem Relation Age of Onset  . Heart attack Maternal Grandfather   . Diabetes Mother   . Hypertension Mother   . Cancer Father   . Heart disease Maternal Grandmother     Physical Exam: Vitals:   01/22/17 2130 01/22/17 2200 01/22/17 2230 01/22/17 2313  BP: (!) 214/112 (!) 210/107 (!) 205/109 (!) 223/119  Pulse: 73 78 78 72  Resp: 20   20  Temp:      TempSrc:      SpO2: 97% 97% 97% 97%  Weight:      Height:        Constitutional: Alert and awake, oriented x3, not in any acute distress. Eyes: PERLA, EOMI, irises appear normal, anicteric sclera,  ENMT: external ears and nose appear normal, lips appears normal, oropharynx mucosa, tongue, posterior pharynx appear normal  Neck: neck appears normal, no  masses, normal ROM, no thyromegaly, no JVD  CVS: S1-S2 clear, no murmur rubs or gallops, no LE edema, normal pedal pulses  Respiratory:  clear to auscultation bilaterally, no wheezing, rales or rhonchi. Respiratory effort normal. No accessory muscle use.  Abdomen: soft nontender, nondistended, normal bowel sounds, no hepatosplenomegaly, no hernias  Musculoskeletal: : no cyanosis, clubbing or edema noted bilaterally Neuro: Cranial nerves II-XII intact, strength, sensation, reflexes Psych: judgement and insight appear normal, stable mood and affect, mental status Skin: no rashes or lesions or ulcers, no induration or  nodules   Data reviewed:  I have personally reviewed following labs and imaging studies Labs:  CBC: Recent Labs  Lab 01/22/17 1514  WBC 9.3  HGB 12.2  HCT 37.9  MCV 76.3*  PLT 478    Basic Metabolic Panel: Recent Labs  Lab 01/22/17 1514  NA 136  K 3.5  CL 103  CO2 27  GLUCOSE 108*  BUN 13  CREATININE 1.65*  CALCIUM 8.5*   GFR Estimated Creatinine Clearance: 50.1 mL/min (A) (by C-G formula based on SCr of 1.65 mg/dL (H)). Liver Function Tests: No results for input(s): AST, ALT, ALKPHOS, BILITOT, PROT, ALBUMIN in the last 168 hours. No results for input(s): LIPASE, AMYLASE in the last 168 hours. No results for input(s): AMMONIA in the last 168 hours. Coagulation profile No results for input(s): INR, PROTIME in the last 168 hours.  Cardiac Enzymes: No results for input(s): CKTOTAL, CKMB, CKMBINDEX, TROPONINI in the last 168 hours. BNP: Invalid input(s): POCBNP CBG: No results for input(s): GLUCAP in the last 168 hours. D-Dimer No results for input(s): DDIMER in the last 72 hours. Hgb A1c No results for input(s): HGBA1C in the last 72 hours. Lipid Profile No results for input(s): CHOL, HDL, LDLCALC, TRIG, CHOLHDL, LDLDIRECT in the last 72 hours. Thyroid function studies No results for input(s): TSH, T4TOTAL, T3FREE, THYROIDAB in the last 72 hours.  Invalid input(s): FREET3 Anemia work up No results for input(s): VITAMINB12, FOLATE, FERRITIN, TIBC, IRON, RETICCTPCT in the last 72 hours. Urinalysis    Component Value Date/Time   COLORURINE STRAW (A) 04/12/2016 1626   APPEARANCEUR CLEAR 04/12/2016 1626   LABSPEC 1.009 04/12/2016 1626   PHURINE 7.0 04/12/2016 1626   GLUCOSEU NEGATIVE 04/12/2016 1626   HGBUR MODERATE (A) 04/12/2016 1626   BILIRUBINUR NEGATIVE 04/12/2016 1626   KETONESUR NEGATIVE 04/12/2016 1626   PROTEINUR 100 (A) 04/12/2016 1626   UROBILINOGEN 0.2 05/01/2013 0834   NITRITE NEGATIVE 04/12/2016 1626   LEUKOCYTESUR NEGATIVE 04/12/2016 1626      Microbiology No results found for this or any previous visit (from the past 240 hour(s)).     Inpatient Medications:   Scheduled Meds: . cloNIDine  0.1 mg Transdermal Weekly   Continuous Infusions:   Radiological Exams on Admission: Ct Head Wo Contrast  Result Date: 01/22/2017 CLINICAL DATA:  Dizziness. EXAM: CT HEAD WITHOUT CONTRAST TECHNIQUE: Contiguous axial images were obtained from the base of the skull through the vertex without intravenous contrast. COMPARISON:  April 12, 2016 FINDINGS: Brain: No subdural, epidural, or subarachnoid hemorrhage. Ventricles and sulci are stable. Cerebellum, brainstem, and basal cisterns are stable. Scattered white matter changes are identified. A possible lacunar infarct in the right thalamus is unchanged. No acute cortical ischemia or infarct identified on this study. No mass effect or midline shift. Vascular: No hyperdense vessel or unexpected calcification. Skull: Normal. Negative for fracture or focal lesion. Sinuses/Orbits: No acute finding. Other: None. IMPRESSION: 1. No acute intracranial abnormality identified. Chronic white  matter changes as above. Electronically Signed   By: Dorise Bullion III M.D   On: 01/22/2017 18:18    Impression/Recommendations  1. Hypertension with hypertensive urgency - Pt presents with hypertensive urgency, likely secondary to stopping her clonidine patch - Basic blood work is stable, head CT negative, pt asymptomatic and wants to go home  - During admission for ICH in March '17, she was ruled-out for RAS, renin & aldosterone were normal, but plasma catecholamines remained pending and have since returned with isolated elevation in plasma epinephrine >3x upper limit of normal (non-specific)  - Recommend repeat plasma epinephrine now (ordered) and obtaining outpatient endocrinology referral for follow-up on this - In the interim, would recommend replacing the clonidine patch with oral clonidine in light of  pt's financial constraints, and also add alpha-blocker with prazosin 1 mg BID or TID  - She should follow-up with PCP in the coming week and addition of beta-blocker can be considered at that time  - Pt demonstrates good understanding of the plan and return precautions    Thank you for this consultation.     Time Spent: 63 minutes  Vianne Bulls M.D. [facility] 01/22/2017, 11:25 PM

## 2017-01-22 NOTE — ED Notes (Signed)
Message sent to pharmacy regarding missing dose

## 2017-01-23 MED ORDER — PRAZOSIN HCL 1 MG PO CAPS: 1.0000 mg | ORAL_CAPSULE | Freq: Two times a day (BID) | ORAL | 0 refills | Status: DC

## 2017-01-23 NOTE — Discharge Instructions (Signed)
Please return to the emergency room if you develop arm pain, chest pain, shortness of breath, head ache, or have any concerns.  Please follow up with your doctor.  Please call them Monday to discuss switching your medicines.  I have given you a rx for a new blood pressure medicine.  Please take this in addition to your normal medicines.

## 2017-01-23 NOTE — ED Notes (Signed)
Patient left at this time with all belongings. 

## 2017-01-26 LAB — METANEPHRINES, PLASMA
Metanephrine, Free: 48 pg/mL (ref 0–62)
Normetanephrine, Free: 96 pg/mL (ref 0–145)

## 2017-04-06 ENCOUNTER — Other Ambulatory Visit: Payer: Self-pay

## 2017-04-06 ENCOUNTER — Ambulatory Visit (HOSPITAL_COMMUNITY)
Admission: EM | Admit: 2017-04-06 | Discharge: 2017-04-06 | Disposition: A | Discharge status: Home / Self Care | Payer: PRIVATE HEALTH INSURANCE | Attending: Family Medicine | Admitting: Family Medicine

## 2017-04-06 ENCOUNTER — Encounter (HOSPITAL_COMMUNITY): Payer: Self-pay | Admitting: Emergency Medicine

## 2017-04-06 DIAGNOSIS — J111 Influenza due to unidentified influenza virus with other respiratory manifestations: Secondary | ICD-10-CM

## 2017-04-06 DIAGNOSIS — R062 Wheezing: Secondary | ICD-10-CM

## 2017-04-06 DIAGNOSIS — R69 Illness, unspecified: Principal | ICD-10-CM

## 2017-04-06 DIAGNOSIS — R112 Nausea with vomiting, unspecified: Secondary | ICD-10-CM

## 2017-04-06 MED ORDER — ACETAMINOPHEN 325 MG PO TABS
ORAL_TABLET | ORAL | Status: AC
  Filled 2017-04-06: qty 2

## 2017-04-06 MED ORDER — ACETAMINOPHEN 325 MG PO TABS
650.0000 mg | ORAL_TABLET | Freq: Once | ORAL | Status: AC
  Administered 2017-04-06: 650 mg via ORAL

## 2017-04-06 MED ORDER — BENZONATATE 100 MG PO CAPS: 100.0000 mg | ORAL_CAPSULE | Freq: Three times a day (TID) | ORAL | 0 refills | Status: DC

## 2017-04-06 MED ORDER — OSELTAMIVIR PHOSPHATE 75 MG PO CAPS: 75.0000 mg | ORAL_CAPSULE | Freq: Two times a day (BID) | ORAL | 0 refills | Status: AC

## 2017-04-06 MED ORDER — ALBUTEROL SULFATE HFA 108 (90 BASE) MCG/ACT IN AERS: 2.0000 | INHALATION_SPRAY | RESPIRATORY_TRACT | 0 refills | Status: DC | PRN

## 2017-04-06 NOTE — ED Notes (Signed)
Patient discharged by provider.

## 2017-04-06 NOTE — ED Triage Notes (Signed)
Patient has a cough, general aches and pain.  Patient has nausea and vomiting for 2 days.

## 2017-04-06 NOTE — Discharge Instructions (Addendum)

## 2017-04-11 NOTE — ED Provider Notes (Signed)
[place]   [id number] 04/06/17 Arrival Time: 4098  ASSESSMENT & PLAN:  1. Influenza-like illness   2. Nausea and vomiting, intractability of vomiting not specified, unspecified vomiting type   3. Wheezing     Meds ordered this encounter  Medications  . acetaminophen (TYLENOL) tablet 650 mg  . oseltamivir (TAMIFLU) 75 MG capsule    Sig: Take 1 capsule (75 mg total) by mouth 2 (two) times daily for 5 days.    Dispense:  10 capsule    Refill:  0  . albuterol (PROVENTIL HFA;VENTOLIN HFA) 108 (90 Base) MCG/ACT inhaler    Sig: Inhale 2 puffs into the lungs every 4 (four) hours as needed for wheezing or shortness of breath.    Dispense:  1 Inhaler    Refill:  0  . benzonatate (TESSALON) 100 MG capsule    Sig: Take 1 capsule (100 mg total) by mouth every 8 (eight) hours.    Dispense:  21 capsule    Refill:  0   Cough medication sedation precautions. Discussed typical duration of symptoms. OTC symptom care as needed. Ensure adequate fluid intake and rest. May f/u with PCP or here as needed.  Reviewed expectations re: course of current medical issues. Questions answered. Outlined signs and symptoms indicating need for more acute intervention. Patient verbalized understanding. After Visit Summary given.   SUBJECTIVE: History from: patient.  Belinda Ramirez is a 42 y.o. female who presents with complaint of nasal congestion, post-nasal drainage, and a persistent dry cough. Onset abrupt, approximately 2 days ago. Overall fatigued with body aches. SOB: none. Wheezing: at times; mild to moderate, esp with coughing. Some post tussive emesis with mild nausea afterward. Fever: yes. Overall decreased PO intake without n/v. Sick contacts: no. OTC treatment: Tylenol with mild help.  Social History   Tobacco Use  Smoking Status Never Smoker  Smokeless Tobacco Never Used    ROS: As per HPI.   OBJECTIVE:  Vitals:   04/06/17 1827  BP: (!) 170/89  Pulse: 93  Resp:  (!) 24  Temp: (!) 101.2 F (38.4 C)  TempSrc: Oral    Febrile. General appearance: alert; appears fatigued HEENT: nasal congestion; clear runny nose; throat irritation secondary to post-nasal drainage Neck: supple without LAD Lungs: unlabored respirations, symmetrical air entry; cough: moderate with some expiratory wheezes at bases; no respiratory distress; recheck RR 18 Skin: warm and dry Psychological: alert and cooperative; normal mood and affect   Allergies  Allergen Reactions  . Oxycodone Hives  . Percocet [Oxycodone-Acetaminophen] Hives  . Aspirin Other (See Comments)    Hx of hemorrhagic stroke (pt does take low dose aspirin )  . Latex Itching    Past Medical History:  Diagnosis Date  . High cholesterol   . Hypertension   . Obesity   . Sickle cell trait ([place])   . Stroke [facility])    Family History  Problem Relation Age of Onset  . Heart attack Maternal Grandfather   . Diabetes Mother   . Hypertension Mother   . Cancer Father   . Heart disease Maternal Grandmother    Social History   Socioeconomic History  . Marital status: Single    Spouse name: Not on file  . Number of children: 0  . Years of education: 59  . Highest education level: Not on file  Social Needs  . Financial resource strain: Not on file  . Food insecurity - worry: Not on file  . Food insecurity - inability: Not on  file  . Transportation needs - medical: Not on file  . Transportation needs - non-medical: Not on file  Occupational History  . Not on file  Tobacco Use  . Smoking status: Never Smoker  . Smokeless tobacco: Never Used  Substance and Sexual Activity  . Alcohol use: Yes    Alcohol/week: 0.0 oz    Comment: occaisonal  . Drug use: No  . Sexual activity: Yes    Birth control/protection: Other-see comments    Comment: BTL  Other Topics Concern  . Not on file  Social History Narrative   Drinks soda and tea daily            Vanessa Kick, MD 04/11/17 [phone]

## 2017-07-11 ENCOUNTER — Other Ambulatory Visit: Payer: Self-pay

## 2017-07-11 ENCOUNTER — Observation Stay (HOSPITAL_COMMUNITY): Payer: Self-pay

## 2017-07-11 ENCOUNTER — Inpatient Hospital Stay (HOSPITAL_COMMUNITY)
Admission: EM | Admit: 2017-07-11 | Discharge: 2017-07-14 | DRG: 071 | Disposition: A | Discharge status: Home Health | Payer: Self-pay | Attending: Student in an Organized Health Care Education/Training Program | Admitting: Student in an Organized Health Care Education/Training Program

## 2017-07-11 ENCOUNTER — Encounter (HOSPITAL_COMMUNITY): Payer: Self-pay | Admitting: Emergency Medicine

## 2017-07-11 ENCOUNTER — Emergency Department (HOSPITAL_COMMUNITY): Payer: Self-pay

## 2017-07-11 DIAGNOSIS — I6783 Posterior reversible encephalopathy syndrome: Principal | ICD-10-CM | POA: Diagnosis present

## 2017-07-11 DIAGNOSIS — N3941 Urge incontinence: Secondary | ICD-10-CM | POA: Diagnosis present

## 2017-07-11 DIAGNOSIS — R202 Paresthesia of skin: Secondary | ICD-10-CM

## 2017-07-11 DIAGNOSIS — E785 Hyperlipidemia, unspecified: Secondary | ICD-10-CM | POA: Diagnosis present

## 2017-07-11 DIAGNOSIS — T502X6A Underdosing of carbonic-anhydrase inhibitors, benzothiadiazides and other diuretics, initial encounter: Secondary | ICD-10-CM | POA: Diagnosis present

## 2017-07-11 DIAGNOSIS — G43109 Migraine with aura, not intractable, without status migrainosus: Secondary | ICD-10-CM | POA: Diagnosis present

## 2017-07-11 DIAGNOSIS — Z881 Allergy status to other antibiotic agents status: Secondary | ICD-10-CM

## 2017-07-11 DIAGNOSIS — Z6841 Body Mass Index (BMI) 40.0 and over, adult: Secondary | ICD-10-CM

## 2017-07-11 DIAGNOSIS — Z888 Allergy status to other drugs, medicaments and biological substances status: Secondary | ICD-10-CM

## 2017-07-11 DIAGNOSIS — T465X6A Underdosing of other antihypertensive drugs, initial encounter: Secondary | ICD-10-CM | POA: Diagnosis present

## 2017-07-11 DIAGNOSIS — N183 Chronic kidney disease, stage 3 (moderate): Secondary | ICD-10-CM | POA: Diagnosis present

## 2017-07-11 DIAGNOSIS — N179 Acute kidney failure, unspecified: Secondary | ICD-10-CM | POA: Diagnosis present

## 2017-07-11 DIAGNOSIS — N189 Chronic kidney disease, unspecified: Secondary | ICD-10-CM

## 2017-07-11 DIAGNOSIS — Z886 Allergy status to analgesic agent status: Secondary | ICD-10-CM

## 2017-07-11 DIAGNOSIS — E669 Obesity, unspecified: Secondary | ICD-10-CM

## 2017-07-11 DIAGNOSIS — I10 Essential (primary) hypertension: Secondary | ICD-10-CM

## 2017-07-11 DIAGNOSIS — E7800 Pure hypercholesterolemia, unspecified: Secondary | ICD-10-CM | POA: Diagnosis present

## 2017-07-11 DIAGNOSIS — Z9114 Patient's other noncompliance with medication regimen: Secondary | ICD-10-CM

## 2017-07-11 DIAGNOSIS — I129 Hypertensive chronic kidney disease with stage 1 through stage 4 chronic kidney disease, or unspecified chronic kidney disease: Secondary | ICD-10-CM

## 2017-07-11 DIAGNOSIS — T464X6A Underdosing of angiotensin-converting-enzyme inhibitors, initial encounter: Secondary | ICD-10-CM | POA: Diagnosis present

## 2017-07-11 DIAGNOSIS — I161 Hypertensive emergency: Secondary | ICD-10-CM | POA: Diagnosis present

## 2017-07-11 DIAGNOSIS — Z885 Allergy status to narcotic agent status: Secondary | ICD-10-CM

## 2017-07-11 DIAGNOSIS — Z91120 Patient's intentional underdosing of medication regimen due to financial hardship: Secondary | ICD-10-CM

## 2017-07-11 DIAGNOSIS — Z8249 Family history of ischemic heart disease and other diseases of the circulatory system: Secondary | ICD-10-CM

## 2017-07-11 DIAGNOSIS — Z8673 Personal history of transient ischemic attack (TIA), and cerebral infarction without residual deficits: Secondary | ICD-10-CM

## 2017-07-11 DIAGNOSIS — R4781 Slurred speech: Secondary | ICD-10-CM

## 2017-07-11 LAB — DIFFERENTIAL
Abs Immature Granulocytes: 0 10*3/uL (ref 0.0–0.1)
Basophils Absolute: 0 10*3/uL (ref 0.0–0.1)
Basophils Relative: 0 %
EOS PCT: 1 %
Eosinophils Absolute: 0.1 10*3/uL (ref 0.0–0.7)
IMMATURE GRANULOCYTES: 0 %
LYMPHS ABS: 1.8 10*3/uL (ref 0.7–4.0)
LYMPHS PCT: 21 %
Monocytes Absolute: 0.5 10*3/uL (ref 0.1–1.0)
Monocytes Relative: 6 %
Neutro Abs: 6.1 10*3/uL (ref 1.7–7.7)
Neutrophils Relative %: 72 %

## 2017-07-11 LAB — I-STAT CHEM 8, ED
BUN: 18 mg/dL (ref 6–20)
CALCIUM ION: 1.01 mmol/L — AB (ref 1.15–1.40)
CREATININE: 1.4 mg/dL — AB (ref 0.44–1.00)
Chloride: 108 mmol/L (ref 101–111)
GLUCOSE: 113 mg/dL — AB (ref 65–99)
HCT: 41 % (ref 36.0–46.0)
Hemoglobin: 13.9 g/dL (ref 12.0–15.0)
Potassium: 3.8 mmol/L (ref 3.5–5.1)
SODIUM: 140 mmol/L (ref 135–145)
TCO2: 22 mmol/L (ref 22–32)

## 2017-07-11 LAB — CBC
HEMATOCRIT: 39.7 % (ref 36.0–46.0)
Hemoglobin: 13 g/dL (ref 12.0–15.0)
MCH: 24.2 pg — ABNORMAL LOW (ref 26.0–34.0)
MCHC: 32.7 g/dL (ref 30.0–36.0)
MCV: 73.8 fL — AB (ref 78.0–100.0)
Platelets: 333 10*3/uL (ref 150–400)
RBC: 5.38 MIL/uL — ABNORMAL HIGH (ref 3.87–5.11)
RDW: 18.3 % — AB (ref 11.5–15.5)
WBC: 8.6 10*3/uL (ref 4.0–10.5)

## 2017-07-11 LAB — I-STAT BETA HCG BLOOD, ED (MC, WL, AP ONLY): I-stat hCG, quantitative: <5 m[IU]/mL (ref <5)

## 2017-07-11 LAB — COMPREHENSIVE METABOLIC PANEL
ALBUMIN: 3.7 g/dL (ref 3.5–5.0)
ALK PHOS: 64 U/L (ref 38–126)
ALT: 7 U/L — ABNORMAL LOW (ref 14–54)
ANION GAP: 11 (ref 5–15)
AST: 23 U/L (ref 15–41)
BILIRUBIN TOTAL: 1.1 mg/dL (ref 0.3–1.2)
BUN: 15 mg/dL (ref 6–20)
CALCIUM: 9 mg/dL (ref 8.9–10.3)
CO2: 21 mmol/L — ABNORMAL LOW (ref 22–32)
CREATININE: 1.61 mg/dL — AB (ref 0.44–1.00)
Chloride: 108 mmol/L (ref 101–111)
GFR calc Af Amer: 44 mL/min — ABNORMAL LOW (ref >60)
GFR calc non Af Amer: 38 mL/min — ABNORMAL LOW (ref >60)
GLUCOSE: 115 mg/dL — AB (ref 65–99)
Potassium: 4 mmol/L (ref 3.5–5.1)
Sodium: 140 mmol/L (ref 135–145)
TOTAL PROTEIN: 7.7 g/dL (ref 6.5–8.1)

## 2017-07-11 LAB — PROTIME-INR
INR: 1.12
Prothrombin Time: 14.3 seconds (ref 11.4–15.2)

## 2017-07-11 LAB — I-STAT TROPONIN, ED: Troponin i, poc: 0.01 ng/mL (ref 0.00–0.08)

## 2017-07-11 LAB — APTT: APTT: 33 s (ref 24–36)

## 2017-07-11 MED ORDER — STROKE: EARLY STAGES OF RECOVERY BOOK
Freq: Once | Status: AC
  Administered 2017-07-11: 19:00:00
  Filled 2017-07-11: qty 1

## 2017-07-11 MED ORDER — ENOXAPARIN SODIUM 40 MG/0.4ML ~~LOC~~ SOLN
40.0000 mg | SUBCUTANEOUS | Status: DC
  Administered 2017-07-11 – 2017-07-13 (×3): 40 mg via SUBCUTANEOUS
  Filled 2017-07-11 (×3): qty 0.4

## 2017-07-11 MED ORDER — LABETALOL HCL 5 MG/ML IV SOLN
10.0000 mg | INTRAVENOUS | Status: AC | PRN
  Administered 2017-07-11 (×3): 10 mg via INTRAVENOUS
  Filled 2017-07-11 (×3): qty 4

## 2017-07-11 MED ORDER — LISINOPRIL 20 MG PO TABS
20.0000 mg | ORAL_TABLET | Freq: Two times a day (BID) | ORAL | Status: DC
  Administered 2017-07-11 – 2017-07-12 (×2): 20 mg via ORAL
  Filled 2017-07-11 (×2): qty 1

## 2017-07-11 MED ORDER — ACETAMINOPHEN 325 MG PO TABS
650.0000 mg | ORAL_TABLET | Freq: Four times a day (QID) | ORAL | Status: DC | PRN
  Administered 2017-07-12: 650 mg via ORAL
  Filled 2017-07-11: qty 2

## 2017-07-11 MED ORDER — LABETALOL HCL 5 MG/ML IV SOLN
10.0000 mg | INTRAVENOUS | Status: AC | PRN
Start: 2017-07-11 — End: 2017-07-12
  Administered 2017-07-11 – 2017-07-12 (×3): 10 mg via INTRAVENOUS
  Filled 2017-07-11 (×3): qty 4

## 2017-07-11 MED ORDER — ACETAMINOPHEN 650 MG RE SUPP: 650.0000 mg | Freq: Four times a day (QID) | RECTAL | Status: DC | PRN

## 2017-07-11 NOTE — ED Notes (Signed)
Admitting aware of bp  

## 2017-07-11 NOTE — ED Triage Notes (Signed)
Pt states she started having tingling in her left arm and left leg yesterday around 6pm. This morning, pt woke up with same symptoms and slurred speech. Upon arrival to ED, pt no longer has slurred speech, left are no longer tingling but left leg feels tingly. Grips are equal, no drift. No facial droop.

## 2017-07-11 NOTE — Consult Note (Signed)
Neurology Consultation Reason for Consult: left side paresthesias Referring Physician: Evette Doffing  CC: Left side paresthesias  History is obtained from:patient and spouse  HPI: Belinda Ramirez is a 42 y.o. 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   Patient presented to the ED today for Slurred speech and left sided tingling. Patients Husband states that when they woke up this morning she was normal. They went to eat breakfast, and when they were leaving about 8:30 or 9:00am patient stated that she did not feel well. She states that her left arm and left leg were weak and were tingling.  Husband noted that at this time patient speech appeared to be slurred.  Both of them stated that she has had these episodes off and on for 2 to 2-1/2 weeks. She states that this has been happening about 6 times per day for the entire 2-2/1/2 weeks. Patient states that she has noticed that she has been stumbling more often, and loosing control of her bladder more often.  She has had to start walking with a cane again for stability.  She denies any headaches, or vision loss with these episodes. Does admit to briefly losing vision in her right eye at times.  This loss of vision will last about 5 to 10 minutes and then resolve.  Denies pain or HA with this vision loss. Patient also admitted that she is not taking all of her prescribed medications. She is only taking clonidine and a multi-vitamin due to finances.   In  Ed patients BP is 200/112. CT of Head was done and showed no acute intracranial abnormalities. Chronic deep white matter microvascular disease. Small chronic lacunar infarct in the deep white matter  of the left parietal lobe.   LKW: 07-11-17 8:30/9:00am tpa given?: no, outside of window.  Premorbid modified rankin scale: 3  ROS: A 14 point ROS was performed and is negative except as noted in the HPI.  Past Medical History:  Diagnosis Date  . High cholesterol   . Hypertension   . Obesity   . Sickle cell trait ([place])   . Stroke [facility])      Family History  Problem Relation Age of Onset  . Heart attack Maternal Grandfather   . Diabetes Mother   . Hypertension Mother   . Cancer Father   . Heart disease Maternal Grandmother      Social History:  reports that she has never smoked. She has never used smokeless tobacco. She reports that she drinks alcohol. She reports that she does not use drugs.   Exam: Current vital signs: BP (!) 197/109   Pulse 73   Temp 98.3 F (36.8 C)   Resp (!) 28   Ht 5\' 5"  (1.651 m)   Wt 117.9 kg (260 lb)   LMP 05/31/2017   SpO2 99%   BMI 43.27 kg/m  Vital signs in last 24 hours: Temp:  [98.3 F (36.8 C)-98.6 F (37 C)] 98.3 F (36.8 C) (05/27 1216) Pulse Rate:  [73-90] 73 (05/27 1345) Resp:  [17-28] 28 (05/27 1345) BP: (187-218)/(109-134) 197/109 (05/27 1345) SpO2:  [98 %-99 %] 99 % (05/27 1345) Weight:  [117.9 kg (260 lb)] 117.9 kg (260 lb) (05/27 1049)   Physical Exam  Constitutional: Appears well-developed  and well-nourished.  Psych: Affect appropriate to situation Eyes:  Head: Normocephalic.  Cardiovascular: Normal rate and regular rhythm.  Respiratory: Effort normal, non-labored breathing  Skin: WDI  Neuro: Mental Status: Patient is awake, alert, oriented to person, place, month, year, and situation. Patient is able to give a clear and coherent history. No signs of aphasia or neglect Cranial Nerves: II: Visual Fields are full. Pupils are equal, round, and reactive to light.   III,IV, VI: EOMI without ptosis or diploplia.  V: Facial sensation is symmetric to light touch and  Temperature. VII: Facial movement is  symmetric.  VIII: hearing is intact to voice X: Uvula elevates symmetrically XI: Shoulder shrug is symmetric. XII: tongue is midline without atrophy or fasciculations.  Motor: She gives poor effort thorughout, at Least 4+/5 bilateral UE, in the left lower extremitiy she is 4/5 and 4+/5 on th eright.  Sensory: Sensation is symmetric to light touch and temperature in the arms and legs. Deep Tendon Reflexes: 2+ and symmetric in the biceps and patellae.  Plantars: Toes are downgoing bilaterally. Cerebellar: FNF and HKS are intact bilaterally      I have reviewed labs in epic and the results pertinent to this consultation are: Lipid panel: pending A1c: pending Cr: 1.61 GFR:44   I have reviewed the images obtained: CT Head: no acute intracranial abnormality. Chronic deep white matter microvascular disease. Small chronic lacunar infarct in the deep white matter of the left parietal lobe  Laurey Morale, MSN, NP-C [facility] [phone]  I have seen the patient and reviewed the above note.   Impression: 42 yo F with transient paresthesias and what sounds like visual snow and progressive visual obscuration. The description of the symptoms are most consistent with migraine aura, however in the settin gof her previous CNS disease, seizure must be considered as well. With the positive symptomatology I think that stroke is much less likely, though she is at risk given a history of stroke.   At this point, I would favor re-imaging with MRI and looking for seizure predisposition with   Recommendations: 1) MRI Brain 2) EEG 3) continue aspirin 325mg  4) May consider topiramate following EEG  Roland Rack, MD [facility] [phone]  If 7pm- 7am, please page neurology on call as listed in [place].

## 2017-07-11 NOTE — H&P (Addendum)
Date: 07/11/2017               Patient Name:  Belinda Ramirez MRN: [id number]  DOB: 06/07/1975 Age / Sex: 42 y.o., female   PCP: Elisabeth Cara, PA-C         Medical Service: Internal Medicine Teaching Service         Attending Physician: Dr. Evette Doffing, Mallie Mussel, *    First Contact: Dr. Ronalee Red Pager: [phone]  Second Contact: Dr. Philipp Ovens Pager: [phone]       After Hours (After 5p/  First Contact Pager: [phone]  weekends / holidays): Second Contact Pager: [phone]   Chief Complaint: tingling, slurred speech  History of Present Illness:  Ms. Belinda Ramirez is a 42yo female with PMH of OSA, CKD, HTN, hx of intracranial bleed, and obesity who presents with left-sided tingling and slurred speech. Husband at bedside who provides additional history.  In the last 2-4 weeks, patient has not been feeling her usual self. She endorses numbness in her left leg and arm, as well as slurred speech, decreased vision on the left, and confusion. She has been stumbling due to what the husband notes is left sided weakness in the last few weeks and has required increased use of her cane in order to ambulate. She was evaluated by the paramedics about 1.5 weeks ago, but did not want to go to the Emergency Room, as she was afraid they would admit her to the hospital.  Her husband also notes that in the last month, she has had 4 unresponsive episodes, most recently about 5 days ago. Husband states that during this most recent episode, she went up to use the bathroom and while standing, suddenly started staring off and not responding. She sat back in the chair, did not hit her head and husband denies any shaking/convulsions. He did not urinary incontinence. Husband states the episode lasted about 15-20 minutes and patient was confused and drowsy.  She endorses a left sided dull headache that is a 7/10 in intensity, as well as chronic urinary incontinence where she feels the urge to go but cannot make it to  the bathroom in time. Denies recent illness, recent travel, rashes, chest pain, palpitations, dizziness, lightheadedness, or dysuria.  She denies a history of seizures. She does have a hx of 2 strokes, most recently 2 years ago. She has been told she has an irregular heart rhythm, but denies that she was told she has afib. Denies tobacco use or other illicit substances. Endorses drinking about 1 beer monthly.  ED Course: - BP 187/112, temp 98.6, RR 17, HR 90, O2 98% on RA - CBC wnl, CMP with Cr 1.61. Troponin negative. - CT head negative for acute bleed or other intracranial abnormality. EKG with NSR, normal HR, no ST elevations or T wave inversions. - Discussed with Neurology who recommends admission for work-up for TIA vs seizure.  Meds:  Current Meds  Medication Sig  . albuterol (PROVENTIL HFA;VENTOLIN HFA) 108 (90 Base) MCG/ACT inhaler Inhale 2 puffs into the lungs every 4 (four) hours as needed for wheezing or shortness of breath.  . cloNIDine (CATAPRES) 0.1 MG tablet Take 0.1 mg by mouth daily.  . Multiple Vitamins-Minerals (ADULT GUMMY) CHEW Chew 1 tablet by mouth daily.  - Previously on clonidine patch, but this was switched to the pill because the patch was too expensive. She stopped taking the clonidine tablet about 2 weeks ago and has not been on anything for her BP during  this time.  Allergies: Allergies as of 07/11/2017 - Review Complete 07/11/2017  Allergen Reaction Noted  . Oxycodone Hives 05/01/2013  . Percocet [oxycodone-acetaminophen] Hives 07/16/2013  . Amoxicillin Hives 07/11/2017  . Aspirin Other (See Comments) 05/26/2016  . Latex Itching 07/16/2013   Past Medical History:  Diagnosis Date  . High cholesterol   . Hypertension   . Obesity   . Sickle cell trait ([place])   . Stroke [facility])    Family History:  Family History  Problem Relation Age of Onset  . Heart attack Maternal Grandfather   . Diabetes Mother   . Hypertension Mother   . Cancer Father   . Heart  disease Maternal Grandmother   - Denies known family history of seizures  Social History:  - Denies tobacco use or other illicit substances. Endorses drinking about 1 beer monthly. - Lives at home with her husband  Review of Systems: A complete ROS was negative except as per HPI.  Physical Exam: Blood pressure (!) 197/109, pulse 73, temperature 98.3 F (36.8 C), resp. rate (!) 28, height 5\' 5"  (1.651 m), weight 260 lb (117.9 kg), last menstrual period 05/31/2017, SpO2 99 %.  GEN: Well-appearing, young female lying in bed in NAD. Alert and oriented. Answers questions and follows commands appropriately. HENT: [place]/AT. Moist mucous membranes. No visible lesions. EYES: PERRL. Sclera non-icteric. Conjunctiva clear. Endorses dizziness with left lateral gaze. No nystagmus visualized. RESP: Clear to auscultation bilaterally. No wheezes, rales, or rhonchi. No increased work of breathing. CV: Normal rate and regular rhythm. No murmurs, gallops, or rubs. No carotid bruits. 2+ LE edema bilaterally ABD: Soft. Obese. Non-tender. Non-distended. Normoactive bowel sounds. EXT: No edema. Warm. 2+ DP pulses bilaterally. NEURO: Blurry vision in bilateral eyes, other cranial nerves II-XII intact. 5-/5 LUE and LLE, 5/5 on right. Decreased sensation in LLE, normal sensation otherwise. Normal heel-shin and finger-nose-finger. No apparent audiovisual hallucinations. Speech somewhat slurred and slow but appropriate. PSYCH: Patient is calm and pleasant. Appropriate affect. Well-groomed; speech is appropriate and on-subject.  Labs CBC Latest Ref Rng & Units 07/11/2017 07/11/2017 01/22/2017  WBC 4.0 - 10.5 K/uL - 8.6 9.3  Hemoglobin 12.0 - 15.0 g/dL 13.9 13.0 12.2  Hematocrit 36.0 - 46.0 % 41.0 39.7 37.9  Platelets 150 - 400 K/uL - 333 332   CMP Latest Ref Rng & Units 07/11/2017 07/11/2017 01/22/2017  Glucose 65 - 99 mg/dL 113(H) 115(H) 108(H)  BUN 6 - 20 mg/dL 18 15 13   Creatinine 0.44 - 1.00 mg/dL 1.40(H) 1.61(H)  1.65(H)  Sodium 135 - 145 mmol/L 140 140 136  Potassium 3.5 - 5.1 mmol/L 3.8 4.0 3.5  Chloride 101 - 111 mmol/L 108 108 103  CO2 22 - 32 mmol/L - 21(L) 27  Calcium 8.9 - 10.3 mg/dL - 9.0 8.5(L)  Total Protein 6.5 - 8.1 g/dL - 7.7 -  Total Bilirubin 0.3 - 1.2 mg/dL - 1.1 -  Alkaline Phos 38 - 126 U/L - 64 -  AST 15 - 41 U/L - 23 -  ALT 14 - 54 U/L - 7(L) -   INR 1.12 Troponin 0.01  EKG: personally reviewed my interpretation is NSR, normal HR, no ST elevations or T wave inversions  CT head No acute intracranial abnormality. Chronic deep white matter microvascular disease. Small chronic lacunar infarct in the deep white matter of the left parietal lobe.  Assessment & Plan by Problem: Active Problems:   Paresthesias  Ms. Washer is a 42yo female with PMH of OSA, CKD, HTN, hx  of intracranial bleed, and obesity who presents with 2-4 weeks of left-sided tingling, slurred speech, and 4 episodes of non-responsiveness. Etiology concerning for TIA vs stroke vs hypertensive encephalopathy.  Left sided tingling, slurred speech Hypertensive emergency 2-4 weeks of symptoms. Home regimen includes clonidine 0.1mg  tablet daily, however patient states she has not taken this in the last 2 weeks due to issues with affordability, and has also not been taking HCTZ 25mg  daily or lisinopril 20mg  BID. sBP elevated on admission, concerning for uncontrolled HTN +/- rebound from discontinuation of clonidine. Discussed with Neurology who feel her symptoms are likely consistent with PRES/hypertensive emergency. Will decrease blood pressure gradually given chronic elevation - goal MAP is 95 in next 24 hours. Stroke ruled out by MRI. - Neurology consulted; appreciate their assistance - Admit to telemetry - IV labetalol 10mg  q78min PRN x3 doses for goal MAP 95, discussed with RN - Start home lisinopril 20mg  daily - Echocardiogram - Hemoglobin A1c, Lipid panel - Will restart home atorvastatin 40mg  QHS when  tolerating PO intake - SLP eval - PT/OT eval  Non-responsive episodes Patient denies personal history or family history of seizures, but the history she provides is concerning for possible seizures. Will follow-up Neurology's recommendations for 24h vs continuous EEG. - Appreciate Neurology's recommendations  CKD Creatinine 1.61 on admission. Baseline ~1.3-1.4. - BMP in AM  Diet: NPO until SLP eval VTE PPx: Lovenox Code Status: Full code Dispo: Admit patient to Observation with expected length of stay less than 2 midnights.  Signed: Colbert Ewing, MD 07/11/2017, 2:08 PM  Pager: Mamie Nick [phone]

## 2017-07-11 NOTE — ED Notes (Signed)
Patient transported to CT 

## 2017-07-11 NOTE — ED Provider Notes (Signed)
[place] EMERGENCY DEPARTMENT Provider Note   CSN: [id number] Arrival date & time: 07/11/17  1041     History   Chief Complaint Chief Complaint  Patient presents with  . Tingling    HPI Belinda [facility] is a 42 y.o. female.  42 yo F with a chief complaint of left-sided tingling.  This started this morning when she woke up she felt she was tingling from her left arm down to her left leg.  She also noted that her speech was little bit different.  Feel that she was slurring her words.  She thinks this is improved throughout the morning however her significant other feels that it is persisted.  She had an event last week where she was awake but was unresponsive.  This lasted for about 10 minutes per the significant other.  She was somewhat confused afterwards as well.  He denied any shaking.  Denies any head injury.  The history is provided by the spouse and the patient.  Illness  This is a new problem. The current episode started 2 days ago. The problem occurs constantly. The problem has been gradually improving. Pertinent negatives include no chest pain, no headaches and no shortness of breath. Nothing aggravates the symptoms. Nothing relieves the symptoms. She has tried nothing for the symptoms. The treatment provided no relief.    Past Medical History:  Diagnosis Date  . High cholesterol   . Hypertension   . Obesity   . Sickle cell trait ([place])   . Stroke [facility])     Patient Active Problem List   Diagnosis Date Noted  . CKD (chronic kidney disease), stage III ([place]) 01/22/2017  . 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 Vision loss of right eye   . Exertional shortness of breath   . Exertional chest pain   . History of cerebral hemorrhage   . OSA (obstructive sleep apnea) 06/19/2015  . 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 Accelerated hypertension   . HLD (hyperlipidemia)   . Morbid obesity due to excess calories ([place])   .  Intraparenchymal hematoma of brain ([place]) 04/30/2015  . ICH (intracerebral hemorrhage) ([place]) 04/30/2015  . Hypertensive urgency   . Hyperlipidemia   . Obesity   . AKI (acute kidney injury) ([employer])   . Hypokalemia     Past Surgical History:  Procedure Laterality Date  . TUBAL LIGATION       OB History   None      Home Medications    Prior to Admission medications   Medication Sig Start Date End Date Taking? Authorizing Provider  albuterol (PROVENTIL HFA;VENTOLIN HFA) 108 (90 Base) MCG/ACT inhaler Inhale 2 puffs into the lungs every 4 (four) hours as needed for wheezing or shortness of breath. 04/06/17  Yes Hagler, Aaron Edelman, MD  cloNIDine (CATAPRES) 0.1 MG tablet Take 0.1 mg by mouth daily.   Yes [provider]  Multiple Vitamins-Minerals (ADULT GUMMY) CHEW Chew 1 tablet by mouth daily.   Yes [provider]  atorvastatin (LIPITOR) 40 MG tablet Take 1 tablet (40 mg total) by mouth daily at 6 PM. Patient not taking: Reported on 01/22/2017 05/06/15   Rinehuls, Early Chars, PA-C  cloNIDine (CATAPRES - DOSED IN MG/24 HR) 0.1 mg/24hr patch Place 1 patch (0.1 mg total) onto the skin once a week. Patient not taking: Reported on 07/11/2017 04/20/16   Holley Raring, MD  hydrochlorothiazide (HYDRODIURIL) 25 MG tablet Take 1 tablet (25 mg total) by mouth daily. Patient  not taking: Reported on 07/11/2017 04/16/16   Holley Raring, MD  lisinopril (PRINIVIL,ZESTRIL) 20 MG tablet Take 1 tablet (20 mg total) by mouth 2 (two) times daily. Patient not taking: Reported on 01/22/2017 03/23/16   Ward, Delice Bison, DO    Family History Family History  Problem Relation Age of Onset  . Heart attack Maternal Grandfather   . Diabetes Mother   . Hypertension Mother   . Cancer Father   . Heart disease Maternal Grandmother     Social History Social History   Tobacco Use  . Smoking status: Never Smoker  . Smokeless tobacco: Never Used  Substance Use Topics  . Alcohol use: Yes    Alcohol/week: 0.0  oz    Comment: occaisonal  . Drug use: No     Allergies   Oxycodone; Percocet [oxycodone-acetaminophen]; Amoxicillin; Aspirin; and Latex   Review of Systems Review of Systems  Constitutional: Negative for chills and fever.  HENT: Negative for congestion and rhinorrhea.   Eyes: Negative for redness and visual disturbance.  Respiratory: Negative for shortness of breath and wheezing.   Cardiovascular: Negative for chest pain and palpitations.  Gastrointestinal: Negative for nausea and vomiting.  Genitourinary: Negative for dysuria and urgency.  Musculoskeletal: Negative for arthralgias and myalgias.  Skin: Negative for pallor and wound.  Neurological: Positive for speech difficulty and numbness (tingling). Negative for dizziness, weakness and headaches.     Physical Exam Updated Vital Signs BP (!) 187/112   Pulse 81   Temp 98.3 F (36.8 C)   Resp (!) 25   Ht 5\' 5"  (1.651 m)   Wt 117.9 kg (260 lb)   LMP 05/31/2017   SpO2 98%   BMI 43.27 kg/m   Physical Exam  Constitutional: She is oriented to person, place, and time. She appears well-developed and well-nourished. No distress.  HENT:  Head: Normocephalic and atraumatic.  Eyes: Pupils are equal, round, and reactive to light. EOM are normal.  Neck: Normal range of motion. Neck supple.  Cardiovascular: Normal rate and regular rhythm. Exam reveals no gallop and no friction rub.  No murmur heard. Pulmonary/Chest: Effort normal. She has no wheezes. She has no rales.  Abdominal: Soft. She exhibits no distension. There is no tenderness.  Musculoskeletal: She exhibits no edema or tenderness.  Neurological: She is alert and oriented to person, place, and time.  Speech is slurred, cranial nerves are otherwise intact 2 through 12.  She has some weakness 4 out of 5 to the right upper extremity compared to left.  This is chronic for her.  She has some left hip flexion 4 out of 5 compared to right which she is not sure if it is old or  new.  Skin: Skin is warm and dry. She is not diaphoretic.  Psychiatric: She has a normal mood and affect. Her behavior is normal.  Nursing note and vitals reviewed.    ED Treatments / Results  Labs (all labs ordered are listed, but only abnormal results are displayed) Labs Reviewed  CBC - Abnormal; Notable for the following components:      Result Value   RBC 5.38 (*)    MCV 73.8 (*)    MCH 24.2 (*)    RDW 18.3 (*)    All other components within normal limits  COMPREHENSIVE METABOLIC PANEL - Abnormal; Notable for the following components:   CO2 21 (*)    Glucose, Bld 115 (*)    Creatinine, Ser 1.61 (*)    ALT 7 (*)  GFR calc non Af Amer 38 (*)    GFR calc Af Amer 44 (*)    All other components within normal limits  I-STAT CHEM 8, ED - Abnormal; Notable for the following components:   Creatinine, Ser 1.40 (*)    Glucose, Bld 113 (*)    Calcium, Ion 1.01 (*)    All other components within normal limits  PROTIME-INR  APTT  DIFFERENTIAL  I-STAT TROPONIN, ED  I-STAT BETA HCG BLOOD, ED (MC, WL, AP ONLY)  CBG MONITORING, ED    EKG EKG Interpretation  Date/Time:  Monday Jul 11 2017 11:03:56 EDT Ventricular Rate:  81 PR Interval:    QRS Duration: 119 QT Interval:  419 QTC Calculation: 487 R Axis:   34 Text Interpretation:  Sinus rhythm Prolonged PR interval Nonspecific intraventricular conduction delay ST elev, probable normal early repol pattern No significant change since last tracing Confirmed by Deno Etienne [phone]) on 07/11/2017 1:05:46 PM   Radiology Ct Head Wo Contrast  Result Date: 07/11/2017 CLINICAL DATA:  Left-sided numbness and slurred speech.  Headache. EXAM: CT HEAD WITHOUT CONTRAST TECHNIQUE: Contiguous axial images were obtained from the base of the skull through the vertex without intravenous contrast. COMPARISON:  01/22/2017 FINDINGS: Brain: No evidence of acute infarction, hemorrhage, hydrocephalus, extra-axial collection or mass lesion/mass effect.  Small area of encephalomalacia from prior lacunar infarct in the deep white matter of the left parietal lobe. Chronic deep white matter microvascular disease. Vascular: No hyperdense vessel or unexpected calcification. Skull: Normal. Negative for fracture or focal lesion. Sinuses/Orbits: No acute finding. Other: None. IMPRESSION: No acute intracranial abnormality. Chronic deep white matter microvascular disease. Small chronic lacunar infarct in the deep white matter of the left parietal lobe. Electronically Signed   By: Fidela Salisbury M.D.   On: 07/11/2017 11:35    Procedures Procedures (including critical care time)  Medications Ordered in ED Medications - No data to display   Initial Impression / Assessment and Plan / ED Course  I have reviewed the triage vital signs and the nursing notes.  Pertinent labs & imaging results that were available during my care of the patient were reviewed by me and considered in my medical decision making (see chart for details).     42 yo F with a chief complaint of left-sided tingling and slurred speech.  This is been off and on since yesterday.  She also had an event last week where she started in the space and was somewhat confused of the event afterwards.  She has a history of an inter parenchymal hemorrhage.  CT here negative as reviewed by me for acute intercranial hemorrhage.  I discussed the case with Dr. Leonel Ramsay, neurology recommended the patient be placed in the hospital for a TIA versus seizure work-up.  The patients results and plan were reviewed and discussed.   Any x-rays performed were independently reviewed by myself.   Differential diagnosis were considered with the presenting HPI.  Medications - No data to display  Vitals:   07/11/17 1049 07/11/17 1058 07/11/17 1102 07/11/17 1216  BP:  (!) 187/112    Pulse:   81   Resp:   (!) 25   Temp:    98.3 F (36.8 C)  TempSrc:      SpO2:   98%   Weight: 117.9 kg (260 lb)       Height: 5\' 5"  (1.651 m)       Final diagnoses:  Slurred speech    Admission/ observation were  discussed with the admitting physician, patient and/or family and they are comfortable with the plan.    Final Clinical Impressions(s) / ED Diagnoses   Final diagnoses:  Slurred speech    ED Discharge Orders    None       Deno Etienne, DO 07/11/17 1306

## 2017-07-11 NOTE — Progress Notes (Signed)
CPAP is set up at bedside when patient is ready. Pt will have someone call when she is ready. RT will continue to monitor.

## 2017-07-11 NOTE — Discharge Summary (Signed)
Name: Belinda Ramirez MRN: [id number] DOB: [birth year] 42 y.o. PCP: Belinda [facility]  Date of Admission: 07/11/2017 10:47 AM Date of Discharge: 07/14/2017 Attending Physician: Belinda [facility], *  Discharge Diagnosis: 1. Posterior reversible encephalopathy syndrome (PRES) 2. Severe symptomatic hypertension 3. AKI on CKD  Principal Problem:   PRES (posterior reversible encephalopathy syndrome) Active Problems:   Paresthesias   Discharge Medications: Allergies as of 07/14/2017      Reactions   Oxycodone Hives   Percocet [oxycodone-acetaminophen] Hives   Amoxicillin Hives   Has patient had a PCN reaction causing immediate rash, facial/tongue/throat swelling, SOB or lightheadedness with hypotension: No Has patient had a PCN reaction causing severe rash involving mucus membranes or skin necrosis: No Has patient had a PCN reaction that required hospitalization: No Has patient had a PCN reaction occurring within the last 10 years: Yes If all of the above answers are "NO", then may proceed with Cephalosporin use.   Aspirin Other (See Comments)   Hx of hemorrhagic stroke (pt does take low dose aspirin )   Latex Itching      Medication List    STOP taking these medications   cloNIDine 0.1 MG tablet Commonly known as:  CATAPRES   cloNIDine 0.1 mg/24hr patch Commonly known as:  CATAPRES - Dosed in mg/24 hr   hydrochlorothiazide 25 MG tablet Commonly known as:  HYDRODIURIL   lisinopril 20 MG tablet Commonly known as:  PRINIVIL,ZESTRIL     TAKE these medications   ADULT GUMMY Chew Chew 1 tablet by mouth daily.   albuterol 108 (90 Base) MCG/ACT inhaler Commonly known as:  PROVENTIL HFA;VENTOLIN HFA Inhale 2 puffs into the lungs every 4 (four) hours as needed for wheezing or shortness of breath.   amLODipine 10 MG tablet Commonly known as:  NORVASC Take 1 tablet (10 mg total) by mouth daily. Start taking on:  07/15/2017   atorvastatin 40 MG  tablet Commonly known as:  LIPITOR Take 1 tablet (40 mg total) by mouth daily at 6 PM.   chlorthalidone 50 MG tablet Commonly known as:  HYGROTON Take 1 tablet (50 mg total) by mouth daily.       Disposition and follow-up:   Belinda [facility] was discharged from [facility] in Stable condition.  At the [facility] follow up visit please address:  Appointment in Internal Medicine Teaching Service clinic is a one-time [facility] follow-up visit. Patient has an appointment at [facility] to establish with new PCP in ~1 month.  1.  HTN, PRES - Please ensure patient is taking blood pressure medications (amlodipine, chlorthalidone) and is able to afford them - Can restart lisinopril if patient's AKI improved - Please ensure HH PT has been set up - Please ensure she follows up with Neurology in 2-4 weeks  2.  AKI, probably from severely elevated BP - Check BMP  3.  Labs / imaging needed at time of follow-up: BMP for Cr  4.  Pending labs/ test needing follow-up: None  Follow-up Appointments: Follow-up Information    [facility] Follow up on 08/10/2017.   Why:  Your appointment is at 8:50 am. Please arrive 15 min early and bring: picture ID and your current medications. Contact information: [facility] Follow up.   Why:  Please use this location for your pharmacy needs.  Contact information: [address]  [place] [id number] [place]. Go on 07/21/2017.   Why:  at 2:45pm for a one-time Ramirez follow up visit. Contact information: Bel[facility] Course by problem list: Principal Problem:   PRES (posterior reversible encephalopathy syndrome) Active Problems:    Paresthesias   1. Posterior reversible encephalopathy syndrome (PRES) Severe symptomatic hypertension Patient admitted with 2-4 weeks of left-sided paresthesias, blurry vision, headaches, and slurred speech. sBP elevated to 190s-200s on admission. She reports being off all of her anti-hypertensive medications, including clonidine, in the last 2 weeks due to financial barriers. Head CT and brain MRI were negative for CVA or acute intracranial bleed, however did show white matter changes concerning for PRES. Her blood pressure was gradually lowered, as patient's blood pressure is likely chronically elevated. Her BP improved with re-initiation of oral blood pressure medications and she was educated on the importance of continuing to take her medications and following up with her PCP. Evaluated by PT/OT who recommended Home Health PT.  Clonidine was discontinued, as this medication increases the risk for rebound hypertension if patient unable to take it. Beta blockers were avoided due to borderline prolonged PR interval and low HR in 50s-60s. In order to promote better adherence, her home regimen was changed to chlorthalidone and amlodipine. Lisinopril held on discharge due to AKI, please re-start when appropriate.   2. Non-responsive episodes Patient's significant other noted 4 non-responsive episodes in the last month prior to admission. No previous history of seizures. EEG was performed due to concern for seizures, which was negative for epileptiform discharges but showed mild diffuse slowing. Her non-responsive episodes are most likely a symptom of her severely uncontrolled hypertension. She will follow up with outpatient Neurology.  3. Uncontrolled hypertension Patient has been off all anti-hypertensives, including clonidine, for at least the last 2 weeks secondary to financial difficulties. She has a history of hypertensive bleed. sBP was elevated to 190s-200s on admission. After MRI brain ruled out  acute CVA, her blood pressure was gradually lowered due to likely chronically elevated BP. She was discharged with chlorthalidone and amlodipine.  4. AKI on CKD Creatinine elevated during admission, peaking at 2.16, above her baseline of ~1.3-1.4 This was initially felt to be secondary to severely uncontrolled hypertension, however as her blood pressure was lowered, the possibility of relative hypoperfusion was considered. Her creatinine improved to 1.7 on discharge with improvement in her BP.  Discharge Vitals:   BP (!) 159/93 (BP Location: Left Arm)   Pulse 63   Temp 98.6 F (37 C) (Oral)   Resp 18   Ht 5\' 5"  (1.651 m)   Wt 260 lb (117.9 kg)   LMP 05/31/2017   SpO2 100%   BMI 43.27 kg/m   Pertinent Labs, Studies, and Procedures:  CBC Latest Ref Rng & Units 07/11/2017 07/11/2017 01/22/2017  WBC 4.0 - 10.5 K/uL - 8.6 9.3  Hemoglobin 12.0 - 15.0 g/dL 13.9 13.0 12.2  Hematocrit 36.0 - 46.0 % 41.0 39.7 37.9  Platelets 150 - 400 K/uL - 333 332   CMP Latest Ref Rng & Units 07/14/2017 07/13/2017 07/12/2017  Glucose 65 - 99 mg/dL 95 90 100(H)  BUN 6 - 20 mg/dL 14 20 17   Creatinine 0.44 - 1.00 mg/dL 1.70(H) 2.16(H) 2.15(H)  Sodium 135 - 145 mmol/L 138 140 139  Potassium 3.5 - 5.1 mmol/L 3.6 3.6  3.5  Chloride 101 - 111 mmol/L 106 106 107  CO2 22 - 32 mmol/L 27 23 24   Calcium 8.9 - 10.3 mg/dL 8.5(L) 8.6(L) 8.5(L)  Total Protein 6.5 - 8.1 g/dL - - -  Total Bilirubin 0.3 - 1.2 mg/dL - - -  Alkaline Phos 38 - 126 U/L - - -  AST 15 - 41 U/L - - -  ALT 14 - 54 U/L - - -   INR 1.12 Troponin 0.01 HIV nonreactive Hemoglobin A1c 5.4 Lipid panel: Chol 177, TG 35, HDL 56, LDL 114  CT head 07/11/2017 No acute intracranial abnormality. Chronic deep white matter microvascular disease. Small chronic lacunar infarct in the deep white matter of the left parietal lobe.  MRI brain 07/11/2017 No acute stroke is evident. Atrophy and small vessel disease are redemonstrated. Asymmetric T2 and FLAIR  hyperintensity in the RIGHT cerebellar white matter and dentate nucleus, not associated restriction or clear Belinda acute hemorrhage. Query PRES. There has been progression since February of 2018 of numerous microbleeds within the brain, representing sequelae of incompletely treated hypertensive cerebrovascular disease. None of these brain hemorrhages are definitely acute on today's exam.   EEG 07/12/2017 This predominantly drowsy and asleep EEG is abnormal due to mild diffuse slowing of the waking background.  Discharge Instructions: Discharge Instructions    Ambulatory referral to Neurology   Complete by:  As directed    An appointment is requested in approximately: 2-4 weeks   Diet - low sodium heart healthy   Complete by:  As directed    Discharge instructions   Complete by:  As directed    Ms. [place]ia,  It was nice to meet you. We think the symptoms you were having that brought you to the [facility] are due to your blood pressure being too high. I am glad they have gotten better as we treated your blood pressure in the [facility]. It will be very important to continue taking your blood pressure medicines when you are outside of the [facility] to make sure that this doesn't happen again. - Please take amlodipine 10mg  once a day - Please take chlorthalidone 50mg  once a day - Please make sure to go pick these up from the [facility] and [place] today.  FOLLOW-UP APPOINTMENTS - Please come see [name] in the Internal Medicine clinic ([address] on Friday, June 6 at 2:45pm for a one-time [facility] follow up visit to check on your blood pressure and your kidney function. At that time, we can decide whether we need to start another blood pressure medicine. - After that visit, you will be able to go see your primary doctor at the [facility] to continue your care. - Please also call the brain doctor's office (Neurology) in order to set up an appointment to be seen by them in  about 2-4 weeks.  We will also work on getting the physical therapists to come see you at home to help you build your strength back and make sure you are safe getting around at home.  If you have any questions or concerns, call our clinic at [phone] or after hours call [phone] and ask for the internal medicine resident on call.   Increase activity slowly   Complete by:  As directed      Signed: Colbert Ewing, MD 07/14/2017, 1:14 PM   Pager: Mamie Nick [phone]

## 2017-07-12 ENCOUNTER — Other Ambulatory Visit: Payer: Self-pay

## 2017-07-12 ENCOUNTER — Observation Stay (HOSPITAL_COMMUNITY): Payer: Self-pay

## 2017-07-12 ENCOUNTER — Encounter (HOSPITAL_COMMUNITY): Payer: Self-pay | Admitting: General Practice

## 2017-07-12 ENCOUNTER — Other Ambulatory Visit (HOSPITAL_COMMUNITY): Payer: Self-pay

## 2017-07-12 DIAGNOSIS — R531 Weakness: Secondary | ICD-10-CM

## 2017-07-12 DIAGNOSIS — I6783 Posterior reversible encephalopathy syndrome: Principal | ICD-10-CM

## 2017-07-12 LAB — LIPID PANEL
Cholesterol: 177 mg/dL (ref 0–200)
HDL: 56 mg/dL (ref >40)
LDL CALC: 114 mg/dL — AB (ref 0–99)
TRIGLYCERIDES: 35 mg/dL (ref <150)
Total CHOL/HDL Ratio: 3.2 RATIO
VLDL: 7 mg/dL (ref 0–40)

## 2017-07-12 LAB — BASIC METABOLIC PANEL
ANION GAP: 8 (ref 5–15)
BUN: 17 mg/dL (ref 6–20)
CALCIUM: 8.5 mg/dL — AB (ref 8.9–10.3)
CO2: 24 mmol/L (ref 22–32)
Chloride: 107 mmol/L (ref 101–111)
Creatinine, Ser: 2.15 mg/dL — ABNORMAL HIGH (ref 0.44–1.00)
GFR, EST AFRICAN AMERICAN: 31 mL/min — AB (ref >60)
GFR, EST NON AFRICAN AMERICAN: 27 mL/min — AB (ref >60)
Glucose, Bld: 100 mg/dL — ABNORMAL HIGH (ref 65–99)
Potassium: 3.5 mmol/L (ref 3.5–5.1)
Sodium: 139 mmol/L (ref 135–145)

## 2017-07-12 LAB — HEMOGLOBIN A1C
Hgb A1c MFr Bld: 5.4 % (ref 4.8–5.6)
MEAN PLASMA GLUCOSE: 108.28 mg/dL

## 2017-07-12 LAB — GLUCOSE, CAPILLARY: GLUCOSE-CAPILLARY: 129 mg/dL — AB (ref 65–99)

## 2017-07-12 LAB — HIV ANTIBODY (ROUTINE TESTING W REFLEX): HIV SCREEN 4TH GENERATION: NONREACTIVE

## 2017-07-12 MED ORDER — LABETALOL HCL 5 MG/ML IV SOLN
10.0000 mg | INTRAVENOUS | Status: DC | PRN
  Administered 2017-07-12 (×2): 10 mg via INTRAVENOUS
  Filled 2017-07-12 (×2): qty 4

## 2017-07-12 MED ORDER — HYDROCHLOROTHIAZIDE 25 MG PO TABS
25.0000 mg | ORAL_TABLET | Freq: Every day | ORAL | Status: DC
  Administered 2017-07-12 – 2017-07-14 (×3): 25 mg via ORAL
  Filled 2017-07-12 (×3): qty 1

## 2017-07-12 MED ORDER — LABETALOL HCL 5 MG/ML IV SOLN
10.0000 mg | INTRAVENOUS | Status: DC | PRN
  Administered 2017-07-13 (×2): 10 mg via INTRAVENOUS
  Filled 2017-07-12 (×3): qty 4

## 2017-07-12 NOTE — Progress Notes (Addendum)
Subjective: Patient has had no further transient tingling or paresthesias.  At this point in time she has a severe headache that is 10/10.  It is more in the occipital lobe.  She has recently received--lisinopril 20 mg, and Tylenol 650 mg--this was done at 0913.  She is complaining of photophobia and phonophobia.  Patient did state to me that her normal blood pressure is approximately systolically 1 35-4 80.  However she did come in with her systolic blood pressure in the 200s.  Exam: Vitals:   07/12/17 0835 07/12/17 0855  BP: (!) 147/75 (!) 161/96  Pulse:    Resp:    Temp:    SpO2: 97% 96%    Physical Exam   HEENT-  Normocephalic, no lesions, without obvious abnormality.  Normal external eye and conjunctiva.   Cardiovascular- S1-S2 audible, pulses palpable throughout   Lungs-no rhonchi or wheezing noted, no excessive working breathing.  Saturations within normal limits Abdomen- All 4 quadrants palpated and nontender Extremities- Warm, dry and intact Musculoskeletal-no joint tenderness, deformity or swelling Skin-warm and dry, no hyperpigmentation, vitiligo, or suspicious lesions    Neuro:  Mental Status: Alert, oriented, thought content appropriate.  Speech fluent without evidence of aphasia.  Able to follow 3 step commands without difficulty. Cranial Nerves: II: Visual fields grossly normal,  III,IV, VI: ptosis not present, extra-ocular motions intact bilaterally pupils equal, round, reactive to light and accommodation V,VII: smile symmetric, facial light touch sensation normal bilaterally VIII: hearing normal bilaterally IX,X: uvula rises symmetrically XI: bilateral shoulder shrug XII: midline tongue extension Motor: Right : Upper extremity   5/5    Left:     Upper extremity   5/5  Lower extremity   5/5     Lower extremity   5/5 Tone and bulk:normal tone throughout; no atrophy noted Sensory: Pinprick and light touch intact throughout, bilaterally Deep Tendon Reflexes: 2+  and symmetric in biceps and patella Plantars: Right: downgoing   Left: downgoing Cerebellar: Finger-nose-finger and heel-to-shin was intact bilaterally  Medications:  Scheduled: . enoxaparin (LOVENOX) injection  40 mg Subcutaneous Q24H  . hydrochlorothiazide  25 mg Oral Daily  . lisinopril  20 mg Oral BID   Continuous:  SFK:[id number] **OR** acetaminophen, labetalol  Pertinent Labs/Diagnostics: LDL 114 A1c 5.4 Echocardiogram pending final reading EEG pending   Ct Head Wo Contrast  Result Date: 07/11/2017  IMPRESSION: No acute intracranial abnormality. Chronic deep white matter microvascular disease. Small chronic lacunar infarct in the deep white matter of the left parietal lobe. Electronically Signed   By: Fidela Salisbury M.D.   On: 07/11/2017 11:35   Mr Brain Wo Contrast  Result Date: 07/11/2017 . IMPRESSION: No acute stroke is evident. Atrophy and small vessel disease are redemonstrated. Asymmetric T2 and FLAIR hyperintensity in the RIGHT cerebellar white matter and dentate nucleus, not associated restriction or clear cut acute hemorrhage. Query PRES. There has been progression since February of 2018 of numerous microbleeds within the brain, representing sequelae of incompletely treated hypertensive cerebrovascular disease. None of these brain hemorrhages are definitely acute on today's exam. Electronically Signed   By: Staci Righter M.D.   On: 07/11/2017 18:47     Belinda Quill PA-C [facility] [phone]  Impression: 42 year old female with possible pres by MRI with recurrent episodes of paresthesias and visual change.  I would favor treating this as hypertensive emergency, but I still wonder if migraine could be playing a role in her symptomatology.  Without further episodes, I would not pursue other treatments at  this time.   Recommendations: 1) continue BP control 2) consider migraine cocktail with Compazine/Benadryl/Toradol if headache or  paresthesias return 3) EEG   Roland Rack, MD [facility] [phone]  If 7pm- 7am, please page neurology on call as listed in [place].  07/12/2017, 10:23 AM

## 2017-07-12 NOTE — Evaluation (Signed)
Occupational Therapy Evaluation Patient Details Name: Belinda Ramirez MRN: [id number] DOB: [birth year] Today's Date: 07/12/2017    History of Present Illness 42yo female with PMH of OSA, CKD, HTN, hx of intracranial bleed, and obesity who presents with left-sided tingling and slurred speech   Clinical Impression   Pt with decline in function and safety with ADLs and ADL mobility with decreased strength/coordination of L UE and impaired dynamic balance. Pt would benefit from acute OT services to address impairments to maximize level of function and safety    Follow Up Recommendations  No OT follow up;Supervision - Intermittent    Equipment Recommendations  Tub/shower seat    Recommendations for Other Services       Precautions / Restrictions Precautions Precautions: Fall Restrictions Weight Bearing Restrictions: No      Mobility Bed Mobility Overal bed mobility: Needs Assistance Bed Mobility: Rolling;Sidelying to Sit Rolling: Supervision Sidelying to sit: Supervision       General bed mobility comments: increased time and effort to perform with HOB elevated 30 degrees. use of side rail ro pull to sidelying  Transfers Overall transfer level: Needs assistance Equipment used: 1 person hand held assist Transfers: Sit to/from Stand Sit to Stand: Min assist         General transfer comment: min assist for stability, with legs resting against bed rail posterior bais noted    Balance Overall balance assessment: Needs assistance Sitting-balance support: Feet supported Sitting balance-Leahy Scale: Fair   Postural control: Posterior lean Standing balance support: During functional activity Standing balance-Leahy Scale: Fair               High level balance activites: Side stepping;Turns;Direction changes High Level Balance Comments: noted instability with ambulation           ADL either performed or assessed with clinical judgement   ADL Overall ADL's :  Needs assistance/impaired     Grooming: Wash/dry hands;Wash/dry face;Standing;Min guard   Upper Body Bathing: Supervision/ safety;Set up;Sitting   Lower Body Bathing: Minimal assistance   Upper Body Dressing : Supervision/safety;Set up   Lower Body Dressing: Minimal assistance   Toilet Transfer: Minimal assistance;Ambulation;Comfort height toilet;Grab bars   Toileting- Clothing Manipulation and Hygiene: Min guard;Sit to/from stand   Tub/ Banker: Walk-in shower;Ambulation;Minimal assistance;Grab bars   Functional mobility during ADLs: Minimal assistance General ADL Comments: dynamic balance impaired for LB ADLs and ADL mobility     Vision Patient Visual Report: No change from baseline       Perception     Praxis      Pertinent Vitals/Pain Pain Assessment: No/denies pain     Hand Dominance Right   Extremity/Trunk Assessment Upper Extremity Assessment Upper Extremity Assessment: Generalized weakness RUE Deficits / Details: RUE 3+/5 grossly RUE Sensation: WNL RUE Coordination: (intact) LUE Deficits / Details: LUE 3/5 Grossly LUE Sensation: WNL LUE Coordination: decreased fine motor;decreased gross motor   Lower Extremity Assessment Lower Extremity Assessment: Defer to PT evaluation LLE Deficits / Details: modest strength deficits note, 4/5 gross motions LLE Sensation: WNL LLE Coordination: decreased gross motor   Cervical / Trunk Assessment Cervical / Trunk Assessment: Normal   Communication Communication Communication: No difficulties   Cognition Arousal/Alertness: Awake/alert Behavior During Therapy: Flat affect Overall Cognitive Status: Within Functional Limits for tasks assessed                                     General  Comments       Exercises     Shoulder Instructions      Home Living Family/patient expects to be discharged to:: Private residence Living Arrangements: Spouse/significant other   Type of Home:  Apartment Home Access: Level entry     Home Layout: One level     Bathroom Shower/Tub: Teacher, early years/pre: Standard Bathroom Accessibility: Yes   Home Equipment: Cane - single point;Grab bars - toilet;Grab bars - tub/shower          Prior Functioning/Environment Level of Independence: Independent  Gait / Transfers Assistance Needed: uses cane in the community ADL's / Homemaking Assistance Needed: husband helps with standing in showers as well as donning shoes   Comments: ocassional use of cane        OT Problem List: Decreased strength;Decreased activity tolerance;Impaired balance (sitting and/or standing);Decreased coordination;Decreased knowledge of use of DME or AE;Obesity      OT Treatment/Interventions: Self-care/ADL training;DME and/or AE instruction;Therapeutic activities;Balance training;Therapeutic exercise;Neuromuscular education;Patient/family education    OT Goals(Current goals can be found in the care plan section) Acute Rehab OT Goals Patient Stated Goal: to feel better OT Goal Formulation: With patient Time For Goal Achievement: 07/26/17 Potential to Achieve Goals: Good ADL Goals Pt Will Perform Grooming: with set-up;with supervision;standing Pt Will Perform Upper Body Bathing: with supervision Pt Will Perform Lower Body Bathing: with min guard assist;with supervision;with set-up;with caregiver independent in assisting Pt Will Perform Upper Body Dressing: with supervision Pt Will Perform Lower Body Dressing: with min guard assist;with supervision;with set-up;with caregiver independent in assisting Pt Will Transfer to Toilet: with min guard assist;with supervision Pt Will Perform Toileting - Clothing Manipulation and hygiene: with supervision;sit to/from stand Pt Will Perform Tub/Shower Transfer: with min guard assist;with supervision;ambulating Pt/caregiver will Perform Home Exercise Program: Increased strength;Left upper extremity;With  theraband;With Supervision;With written HEP provided  OT Frequency: Min 2X/week   Barriers to D/C:    no barriers       Co-evaluation              AM-PAC PT "6 Clicks" Daily Activity     Outcome Measure Help from another person eating meals?: None Help from another person taking care of personal grooming?: A Little Help from another person toileting, which includes using toliet, bedpan, or urinal?: A Little Help from another person bathing (including washing, rinsing, drying)?: A Little Help from another person to put on and taking off regular upper body clothing?: A Little Help from another person to put on and taking off regular lower body clothing?: A Little 6 Click Score: 19   End of Session    Activity Tolerance: Patient tolerated treatment well Patient left: in chair;with call bell/phone within reach  OT Visit Diagnosis: Unsteadiness on feet (R26.81);Other abnormalities of gait and mobility (R26.89);Muscle weakness (generalized) (M62.81)                Time: 1517-6160 OT Time Calculation (min): 34 min Charges:  OT General Charges $OT Visit: 1 Visit OT Evaluation $OT Eval Moderate Complexity: 1 Mod G-Codes: OT G-codes **NOT FOR INPATIENT CLASS** Functional Assessment Tool Used: AM-PAC 6 Clicks Daily Activity    Britt Bottom 07/12/2017, 1:10 PM

## 2017-07-12 NOTE — Progress Notes (Signed)
Patient current BP 134/92 map 106.    RN contacted provider on call - verbally advised to old PRN BP meds at this time   New orders received   RN will continue to monitor patient

## 2017-07-12 NOTE — Evaluation (Signed)
Physical Therapy Evaluation Patient Details Name: Belinda Ramirez MRN: [id number] DOB: 06-10-1975 Today's Date: 07/12/2017   History of Present Illness  42yo female with PMH of OSA, CKD, HTN, hx of intracranial bleed, and obesity who presents with left-sided tingling and slurred speech  Clinical Impression  Orders received for PT evaluation. Patient demonstrates deficits in functional mobility as indicated below. Will benefit from continued skilled PT to address deficits and maximize function. Will see as indicated and progress as tolerated.      Follow Up Recommendations Home health PT;Supervision/Assistance - 24 hour    Equipment Recommendations  None recommended by PT    Recommendations for Other Services       Precautions / Restrictions Precautions Precautions: Fall Restrictions Weight Bearing Restrictions: No      Mobility  Bed Mobility Overal bed mobility: Needs Assistance Bed Mobility: Rolling;Sidelying to Sit Rolling: Supervision Sidelying to sit: Supervision       General bed mobility comments: increased time and effort to perform with HOB elevated 30 degrees. use of side rail ro pull to sidelying  Transfers Overall transfer level: Needs assistance Equipment used: 1 person hand held assist Transfers: Sit to/from Stand Sit to Stand: Min assist         General transfer comment: min assist for stability, with legs resting against bed rail posterior bais noted  Ambulation/Gait Ambulation/Gait assistance: Min assist Ambulation Distance (Feet): 40 Feet Assistive device: 1 person hand held assist Gait Pattern/deviations: Step-through pattern;Decreased stride length;Drifts right/left Gait velocity: decreased Gait velocity interpretation: <1.31 ft/sec, indicative of household ambulator General Gait Details: posterior instability noted with ambulation  Stairs            Wheelchair Mobility    Modified Rankin (Stroke Patients Only)        Balance Overall balance assessment: Needs assistance Sitting-balance support: Feet supported Sitting balance-Leahy Scale: Fair   Postural control: Posterior lean Standing balance support: During functional activity Standing balance-Leahy Scale: Fair               High level balance activites: Side stepping;Turns;Direction changes High Level Balance Comments: noted instability with ambulation             Pertinent Vitals/Pain Pain Assessment: No/denies pain    Home Living Family/patient expects to be discharged to:: Private residence Living Arrangements: Spouse/significant other   Type of Home: Apartment Home Access: Level entry     Home Layout: One level Home Equipment: Cane - single point;Grab bars - toilet;Grab bars - tub/shower      Prior Function Level of Independence: Independent   Gait / Transfers Assistance Needed: uses cane in the community  ADL's / Homemaking Assistance Needed: husband helps with standing in showers as well as donning shoes  Comments: ocassional use of cane     Hand Dominance   Dominant Hand: Right    Extremity/Trunk Assessment   Upper Extremity Assessment Upper Extremity Assessment: RUE deficits/detail;LUE deficits/detail RUE Deficits / Details: RUE 3+/5 grossly RUE Sensation: WNL RUE Coordination: (intact) LUE Deficits / Details: LUE 3/5 Grossly LUE Sensation: WNL LUE Coordination: decreased fine motor;decreased gross motor    Lower Extremity Assessment Lower Extremity Assessment: LLE deficits/detail LLE Deficits / Details: modest strength deficits note, 4/5 gross motions LLE Sensation: WNL LLE Coordination: decreased gross motor    Cervical / Trunk Assessment Cervical / Trunk Assessment: (increased body habitus)  Communication   Communication: No difficulties  Cognition Arousal/Alertness: Awake/alert Behavior During Therapy: Flat affect  General  Comments      Exercises     Assessment/Plan    PT Assessment Patient needs continued PT services  PT Problem List Decreased strength;Decreased activity tolerance;Decreased balance;Decreased mobility       PT Treatment Interventions DME instruction;Gait training;Functional mobility training;Therapeutic exercise;Therapeutic activities;Balance training;Patient/family education    PT Goals (Current goals can be found in the Care Plan section)  Acute Rehab PT Goals Patient Stated Goal: to feel better PT Goal Formulation: With patient Time For Goal Achievement: 07/26/17 Potential to Achieve Goals: Good    Frequency Min 3X/week   Barriers to discharge        Co-evaluation               AM-PAC PT "6 Clicks" Daily Activity  Outcome Measure Difficulty turning over in bed (including adjusting bedclothes, sheets and blankets)?: Unable Difficulty moving from lying on back to sitting on the side of the bed? : Unable Difficulty sitting down on and standing up from a chair with arms (e.g., wheelchair, bedside commode, etc,.)?: Unable Help needed moving to and from a bed to chair (including a wheelchair)?: A Little Help needed walking in hospital room?: A Little Help needed climbing 3-5 steps with a railing? : A Little 6 Click Score: 12    End of Session   Activity Tolerance: Patient tolerated treatment well;Patient limited by fatigue Patient left: in chair;with call bell/phone within reach Nurse Communication: Mobility status PT Visit Diagnosis: Unsteadiness on feet (R26.81);Other symptoms and signs involving the nervous system (R29.898)    Time: 2952-8413 PT Time Calculation (min) (ACUTE ONLY): 31 min   Charges:   PT Evaluation $PT Eval Moderate Complexity: 1 Mod     PT G Codes:        Alben Deeds, PT DPT  Board Certified Neurologic Specialist [place] 07/12/2017, 10:54 AM

## 2017-07-12 NOTE — Progress Notes (Signed)
EEG Completed; Results Pending  

## 2017-07-12 NOTE — Procedures (Signed)
ELECTROENCEPHALOGRAM REPORT  Date of Study: 07/12/2017  Patient's Name: Belinda Ramirez MRN: [id number] Date of Birth: Jan 25, 1976  Referring Provider: Dr. Lalla Brothers  Clinical History: This is a 42 year old woman with intermittent left-sided weakness  Medications: No AEDs or sedation  Technical Summary: A multichannel digital EEG recording measured by the international 10-20 system with electrodes applied with paste and impedances below 5000 ohms performed as portable with EKG monitoring in predominantly drowsy and asleep patient.  Hyperventilation was not performed. Photic stimulation was performed.  The digital EEG was referentially recorded, reformatted, and digitally filtered in a variety of bipolar and referential montages for optimal display.   Description: The patient is predominantly drowsy and asleep during the recording.  During maximal wakefulness, there is a symmetric, medium voltage 7.5 Hz posterior dominant rhythm that attenuates with eye opening. This is admixed with a small amount of diffuse 4-5 Hz theta and 2-3 Hz delta slowing of the background.  During drowsiness and sleep, there is an increase in theta and delta slowing of the background with vertex waves seen. Photic stimulation did not elicit any abnormalities.  There were no epileptiform discharges or electrographic seizures seen.    EKG lead showed sinus bradycardia at 60bpm.  Impression: This predominantly drowsy and asleep EEG is abnormal due to mild diffuse slowing of the waking background.  Clinical Correlation of the above findings indicates diffuse cerebral dysfunction that is non-specific in etiology and can be seen with hypoxic/ischemic injury, toxic/metabolic encephalopathies, medication effect, or due to excessive drowsiness.  The absence of epileptiform discharges does not rule out a clinical diagnosis of epilepsy.  Clinical correlation is advised.   Ellouise Newer, M.D.

## 2017-07-12 NOTE — Progress Notes (Signed)
   Subjective:  Patient lying in bed this morning. Endorses a throbbing right-sided headache that starts around her eye. She denies current numbness or weakness in her extremities. Discussed that her severely elevated BP was the cause of her symptoms and the importance of continuing anti-hypertensives at home.  Objective:  Vital signs in last 24 hours: Vitals:   07/12/17 0435 07/12/17 0455 07/12/17 0555 07/12/17 0615  BP: 140/81 (!) 142/83 (!) 164/90 (!) 157/89  Pulse:      Resp:      Temp:      TempSrc:      SpO2: 95% 96% 96% 96%  Weight:      Height:       GEN: Lying in bed in NAD CV: NR & RR, no m/r/g RESP: CTAB, no wheezes or rales NEURO: CN II-XII intact. Poor effort, but strength appears symmetric with 5/5 strength throughout. Normal sensation to light touch. Speech somewhat slowed but appropriate.  Assessment/Plan:  Principal Problem:   PRES (posterior reversible encephalopathy syndrome)  Ms. Jurewicz is a 42yo female with PMH of OSA, CKD, HTN, hx of intracranial bleed, and obesity who presents with 2-4 weeks of left-sided tingling, slurred speech, and 4 episodes of non-responsiveness, likely a result of severe symptomatic hypertension with sBP in the 200s on admission. No CVA seen on MRI, but did show changes concerning for PRES, as well as chronic microhemorrhages. Her BP has responded well to restarting home lisinopril and IV labetalol PRN.  Severe symptomatic hypertension, improved In the setting of not taking home BP medications due to financial barriers, including clonidine. BP improved with IV labetalol PRN and home lisinopril. Clonidine likely not a good choice for her as an outpatient. Will restart HCTZ today and likely will be ready for discharge tomorrow. - Neurology consulted; appreciate their assistance - Restart home HCTZ 25mg  daily - Hold home lisinopril 2/2 AKI - PT/OT eval - F/u EEG  AKI on CKD Creatinine 1.61->2.15 this AM. Likely secondary to severe  symptomatic hypertension. Baseline ~1.3-1.4. - BMP in AM - Holding home lisinopril  Dispo: Anticipated discharge in approximately 1-2 day(s).   Colbert Ewing, MD 07/12/2017, 6:29 AM Pager: Mamie Nick [phone]

## 2017-07-13 LAB — BASIC METABOLIC PANEL
ANION GAP: 11 (ref 5–15)
BUN: 20 mg/dL (ref 6–20)
CALCIUM: 8.6 mg/dL — AB (ref 8.9–10.3)
CHLORIDE: 106 mmol/L (ref 101–111)
CO2: 23 mmol/L (ref 22–32)
Creatinine, Ser: 2.16 mg/dL — ABNORMAL HIGH (ref 0.44–1.00)
GFR calc Af Amer: 31 mL/min — ABNORMAL LOW (ref >60)
GFR calc non Af Amer: 27 mL/min — ABNORMAL LOW (ref >60)
Glucose, Bld: 90 mg/dL (ref 65–99)
Potassium: 3.6 mmol/L (ref 3.5–5.1)
Sodium: 140 mmol/L (ref 135–145)

## 2017-07-13 LAB — URINALYSIS, ROUTINE W REFLEX MICROSCOPIC
Bilirubin Urine: NEGATIVE
GLUCOSE, UA: NEGATIVE mg/dL
Ketones, ur: NEGATIVE mg/dL
NITRITE: NEGATIVE
PROTEIN: NEGATIVE mg/dL
Specific Gravity, Urine: 1.008 (ref 1.005–1.030)
pH: 5 (ref 5.0–8.0)

## 2017-07-13 MED ORDER — AMLODIPINE BESYLATE 5 MG PO TABS: 5.0000 mg | ORAL_TABLET | Freq: Every day | ORAL | Status: DC

## 2017-07-13 MED ORDER — AMLODIPINE BESYLATE 10 MG PO TABS
10.0000 mg | ORAL_TABLET | Freq: Every day | ORAL | Status: DC
  Administered 2017-07-13 – 2017-07-14 (×2): 10 mg via ORAL
  Filled 2017-07-13 (×2): qty 1

## 2017-07-13 NOTE — Progress Notes (Signed)
Occupational Therapy Treatment Patient Details Name: Belinda Ramirez MRN: [id number] DOB: 1975/11/17 Today's Date: 07/13/2017    History of present illness 42yo female with PMH of OSA, CKD, HTN, hx of intracranial bleed, and obesity who presents with left-sided tingling and slurred speech   OT comments  Pt making progress with functional goals. OT will continue to follow acutely  Follow Up Recommendations  No OT follow up;Supervision - Intermittent    Equipment Recommendations  Tub/shower seat    Recommendations for Other Services      Precautions / Restrictions Precautions Precautions: Fall Restrictions Weight Bearing Restrictions: No       Mobility Bed Mobility Overal bed mobility: Needs Assistance Bed Mobility: Supine to Sit;Sit to Supine     Supine to sit: Supervision Sit to supine: Supervision      Transfers Overall transfer level: Needs assistance Equipment used: 1 person hand held assist Transfers: Sit to/from Stand Sit to Stand: Min guard              Balance Overall balance assessment: Needs assistance Sitting-balance support: Feet supported Sitting balance-Leahy Scale: Good     Standing balance support: During functional activity Standing balance-Leahy Scale: Fair                             ADL either performed or assessed with clinical judgement   ADL       Grooming: Wash/dry hands;Wash/dry face;Standing;Supervision/safety;Set up;Oral care   Upper Body Bathing: Sitting;Supervision/ safety   Lower Body Bathing: Min guard;Sit to/from stand;With caregiver independent assisting   Upper Body Dressing : Supervision/safety;Standing   Lower Body Dressing: Min guard;Sit to/from stand;With caregiver independent assisting   Toilet Transfer: Ambulation;Comfort height toilet;Grab bars;Min guard   Toileting- Clothing Manipulation and Hygiene: Sit to/from stand;Supervision/safety   Tub/ Shower Transfer: Walk-in  shower;Ambulation;Grab bars;Min guard   Functional mobility during ADLs: Min guard       Vision Patient Visual Report: No change from baseline     Perception     Praxis      Cognition Arousal/Alertness: Awake/alert Behavior During Therapy: WFL for tasks assessed/performed Overall Cognitive Status: Within Functional Limits for tasks assessed                                          Exercises     Shoulder Instructions       General Comments      Pertinent Vitals/ Pain       Pain Assessment: No/denies pain  Home Living       Type of Home: Apartment                              Lives With: Spouse    Prior Functioning/Environment              Frequency  Min 2X/week        Progress Toward Goals  OT Goals(current goals can now be found in the care plan section)  Progress towards OT goals: Progressing toward goals     Plan Discharge plan remains appropriate    Co-evaluation                 AM-PAC PT "6 Clicks" Daily Activity     Outcome Measure   Help from another person eating meals?: None  Help from another person taking care of personal grooming?: A Little Help from another person toileting, which includes using toliet, bedpan, or urinal?: A Little Help from another person bathing (including washing, rinsing, drying)?: A Little Help from another person to put on and taking off regular upper body clothing?: A Little Help from another person to put on and taking off regular lower body clothing?: A Little 6 Click Score: 19    End of Session    OT Visit Diagnosis: Unsteadiness on feet (R26.81);Other abnormalities of gait and mobility (R26.89);Muscle weakness (generalized) (M62.81)   Activity Tolerance Patient tolerated treatment well   Patient Left in bed;with call bell/phone within reach;with family/visitor present   Nurse Communication      Functional Assessment Tool Used: AM-PAC 6 Clicks Daily  Activity   Time: 3672-5500 OT Time Calculation (min): 27 min  Charges: OT G-codes **NOT FOR INPATIENT CLASS** Functional Assessment Tool Used: AM-PAC 6 Clicks Daily Activity OT General Charges $OT Visit: 1 Visit OT Treatments $Self Care/Home Management : 8-22 mins $Therapeutic Activity: 8-22 mins    Britt Bottom 07/13/2017, 2:08 PM

## 2017-07-13 NOTE — Progress Notes (Signed)
Subjective: No further paresthesia or headache.   Exam: Vitals:   07/13/17 0348 07/13/17 0804  BP: (!) 173/91 (!) 177/104  Pulse: (!) 52 (!) 57  Resp: 20 16  Temp: 98.5 F (36.9 C) 98.2 F (36.8 C)  SpO2: 98% 97%   Gen: In bed, NAD Resp: non-labored breathing, no acute distress Abd: soft, nt  Neuro: MS: awake, alert, interactive and appropriate JK:KXFGH, VFF Motor: MAEW Sensory:intact to LT  Pertinent Labs: Cr 2.15  Impression: 42 yo F with likely PRES in the setting of severe hypertension. Her spells sound concerning for migraine as well, and if she were to continue to have  These, then could consider prophylaxis with either topamax or depakote.   Recommendations: 1) Continue to treat as hypertensive emergency 2) Follow up with outpatient neurology(I have requested this)  3) No further recommendations at this time. Management moving forward is blood pressure control. Please call with further questions or concerns.   Roland Rack, MD [facility] [phone]  If 7pm- 7am, please page neurology on call as listed in [place].

## 2017-07-13 NOTE — Care Management Note (Addendum)
Case Management Note  Patient Details  Name: Belinda Ramirez MRN: [id number] Date of Birth: 10/14/1975  Subjective/Objective:     Pt admitted with PRES. She is form home with spouse. Pt has NP in [facility] but no medication assistance.                Action/Plan: CM met with the patient and she is interested in attending one of the [place]. CM was able to obtain her an appointment at [facility]. Information on the AVS. Pt can use [place] for medication assistance.  CM asked Butch Penny with [facility] to see if patient qualifies for [facility] services.  CM following for further d/c needs.  Expected Discharge Date:                  Expected Discharge Plan:     In-House Referral:     Discharge planning Services     Post Acute Care Choice:    Choice offered to:     DME Arranged:    DME Agency:     HH Arranged:    HH Agency:     Status of Service:  In process, will continue to follow  If discussed at Long Length of Stay Meetings, dates discussed:    Additional Comments:  Pollie Friar, RN 07/13/2017, 11:25 AM

## 2017-07-13 NOTE — Progress Notes (Signed)
   Subjective:  Patient lying in bed this morning and states she feels much better. She denies headache or dysuria. She denies current numbness or weakness. She has been able to walk around in her room and did feel lightheaded on one occasion, but this resolved. Discussed the plan to continue monitoring her BP and renal function.  Objective:  Vital signs in last 24 hours: Vitals:   07/12/17 1336 07/12/17 2004 07/12/17 2337 07/13/17 0348  BP: (!) 145/91 (!) 143/65 (!) 149/83 (!) 173/91  Pulse:  64 (!) 59 (!) 52  Resp:  18 18 20   Temp:  99.3 F (37.4 C) 99.3 F (37.4 C) 98.5 F (36.9 C)  TempSrc:  Oral Oral Oral  SpO2: 99% 96% 95% 98%  Weight:      Height:       GEN: Lying in bed in NAD CV: NR & RR, no m/r/g RESP: CTAB, no wheezes or rales NEURO: CN II-XII intact. 5/5 strength throughout. Normal sensation to light touch.  Assessment/Plan:  Principal Problem:   PRES (posterior reversible encephalopathy syndrome)  Ms. Costen is a 42yo female with PMH of OSA, CKD, HTN, hx of intracranial bleed, and obesity who is being managed for severe symptomatic hypertension, which has improved with anti-hypertensive therapy. BP currently elevated after having to hold lisinopril 2/2 AKI. Will start amlodipine today.  Severe symptomatic hypertension, improved Chronic hypertension BP elevated this morning 173/91 after holding lisinopril yesterday due to AKI. Will start amlodipine today to achieve better BP control. Will also attempt to discharge patient with medications in hand, as financial barriers seem to have been an issue prior to admission. - Continue home HCTZ 25mg  daily - Start amlodipine 10mg  daily - Holding home lisinopril 2/2 AKI - Will avoid beta blocker given bradycardia and borderline prolonged PR interval - PT/OT eval -> HH PT - Will consult CM  AKI on CKD Creatinine remains elevated but stable at 2.15->2.16 this AM. Acute increase could potentially be related to ATN in setting  of relative hypoperfusion in management of severely uncontrolled BP, or secondary to severe symptomatic hypertension. Baseline ~1.3-1.4. - BMP in AM - UA - Holding home lisinopril - Will continue management of HTN as above  Dispo: Anticipated discharge in approximately 1-2 day(s).   Colbert Ewing, MD 07/13/2017, 6:58 AM Pager: Mamie Nick [phone]

## 2017-07-13 NOTE — Evaluation (Signed)
Speech Language Pathology Evaluation Patient Details Name: Belinda Ramirez MRN: [id number] DOB: 1975-10-09 Today's Date: 07/13/2017 Time: 8527-7824 SLP Time Calculation (min) (ACUTE ONLY): 13 min  Problem List:  Patient Active Problem List   Diagnosis Date Noted  . Paresthesias 07/11/2017  . PRES (posterior reversible encephalopathy syndrome) 07/11/2017  . CKD (chronic kidney disease), stage III ([place]) 01/22/2017  . 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 Vision loss of right eye   . Exertional shortness of breath   . Exertional chest pain   . History of cerebral hemorrhage   . OSA (obstructive sleep apnea) 06/19/2015  . Cytotoxic brain edema ([place]) 05/05/2015  . Accelerated hypertension   . HLD (hyperlipidemia)   . Morbid obesity due to excess calories ([place])   . Intraparenchymal hematoma of brain ([place]) 04/30/2015  . ICH (intracerebral hemorrhage) ([place]) 04/30/2015  . Hyperlipidemia   . Obesity   . AKI (acute kidney injury) ([place])   . Hypokalemia    Past Medical History:  Past Medical History:  Diagnosis Date  . High cholesterol   . Hypertension   . Obesity   . Sickle cell trait ([place])   . Stroke [facility])    Past Surgical History:  Past Surgical History:  Procedure Laterality Date  . TUBAL LIGATION     HPI:  42yo female with PMH of OSA, CKD, HTN, hx of intracranial bleed, and obesity who presents with left-sided tingling and slurred speech.  MRI: NO acute stroke, but widespread multifocal microhemorrhage, affecting the deep nuclei, white matter, brainstem, and cerebellum, most notable in the RIGHT caudate and LEFT thalamus, consistent with sequelae of hypertensive cerebrovascular disease. There has been marked progression in the number of microhemorrhages since previous brain MR of 04/12/2016 most notably in the LEFT thalamus, and the brainstem.   Assessment / Plan / Recommendation Clinical Impression  Pt presents with baseline cognitive-communicative  function.  Speech is clear and fluent; comprehension/expression are WNL.  Working Marine scientist, attention, and verbal problemsolving are WFL.  No acute SLP needs are identified - our services will sign off.     SLP Assessment  SLP Recommendation/Assessment: Patient does not need any further Speech Lanaguage Pathology Services SLP Visit Diagnosis: Cognitive communication deficit (R41.841)    Follow Up Recommendations       Frequency and Duration           SLP Evaluation Cognition  Overall Cognitive Status: History of cognitive impairments - at baseline Arousal/Alertness: Awake/alert Orientation Level: Oriented X4 Attention: Selective Selective Attention: Appears intact Memory: Appears intact Awareness: Appears intact Executive Function: Reasoning Reasoning: Appears intact Safety/Judgment: Appears intact       Comprehension  Auditory Comprehension Overall Auditory Comprehension: Appears within functional limits for tasks assessed    Expression Expression Primary Mode of Expression: Verbal   Oral / Motor  Oral Motor/Sensory Function Overall Oral Motor/Sensory Function: Within functional limits Motor Speech Overall Motor Speech: Appears within functional limits for tasks assessed   GO                    [name] 07/13/2017, 12:01 PM

## 2017-07-13 NOTE — Progress Notes (Signed)
Pt's BP read 198/89 at 2124, iv Labetalol 10mg  given at 2136, rechecked at 2217, read 184/100, another dose of Labetalol 10mg  repeated at 2223 and BP checked at 2300, read 174/89, pt reassured, will however continue to monitor. [place]-Asidi, Chanele Douglas Efe

## 2017-07-13 NOTE — Progress Notes (Signed)
Physical Therapy Treatment Patient Details Name: Belinda Ramirez MRN: [id number] DOB: Feb 13, 1976 Today's Date: 07/13/2017    History of Present Illness 42yo female with PMH of OSA, CKD, HTN, hx of intracranial bleed, and obesity who presents with left-sided tingling and slurred speech    PT Comments    Patient seen for activity progression, tolerating activity well today. Was able to perform in hall ambulation and stair negotiation. VCs for cadence, utilized music to help facilitate speed setting which seemed successful. Will continue to see and progress as tolerated.   Follow Up Recommendations  Home health PT;Supervision/Assistance - 24 hour     Equipment Recommendations  None recommended by PT    Recommendations for Other Services       Precautions / Restrictions Precautions Precautions: Fall Restrictions Weight Bearing Restrictions: No    Mobility  Bed Mobility Overal bed mobility: Needs Assistance Bed Mobility: Supine to Sit;Sit to Supine Rolling: Supervision Sidelying to sit: Supervision Supine to sit: Supervision Sit to supine: Supervision   General bed mobility comments: increased time and effort to perform with HOB elevated 30 degrees. use of side rail ro pull to sidelying  Transfers Overall transfer level: Needs assistance Equipment used: None Transfers: Sit to/from Stand Sit to Stand: Min guard         General transfer comment: min guard for safety and mobility  Ambulation/Gait Ambulation/Gait assistance: Supervision Ambulation Distance (Feet): 280 Feet Assistive device: None Gait Pattern/deviations: Step-through pattern;Decreased stride length;Drifts right/left Gait velocity: decreased Gait velocity interpretation: <1.8 ft/sec, indicate of risk for recurrent falls General Gait Details: improved stability today, cues for increased cadence(utilized music for setting cadence successfully)   Stairs Stairs: Yes Stairs assistance:  Supervision Stair Management: One rail Right;Step to pattern Number of Stairs: 5 General stair comments: supervision for safety   Wheelchair Mobility    Modified Rankin (Stroke Patients Only)       Balance Overall balance assessment: Needs assistance Sitting-balance support: Feet supported Sitting balance-Leahy Scale: Good     Standing balance support: During functional activity Standing balance-Leahy Scale: Fair                              Cognition Arousal/Alertness: Awake/alert Behavior During Therapy: WFL for tasks assessed/performed Overall Cognitive Status: Within Functional Limits for tasks assessed                                        Exercises      General Comments        Pertinent Vitals/Pain Pain Assessment: No/denies pain    Home Living                      Prior Function            PT Goals (current goals can now be found in the care plan section) Acute Rehab PT Goals Patient Stated Goal: to feel better PT Goal Formulation: With patient Time For Goal Achievement: 07/26/17 Potential to Achieve Goals: Good Progress towards PT goals: Progressing toward goals    Frequency    Min 3X/week      PT Plan Current plan remains appropriate    Co-evaluation              AM-PAC PT "6 Clicks" Daily Activity  Outcome Measure  Difficulty turning over in bed (including adjusting  bedclothes, sheets and blankets)?: Unable Difficulty moving from lying on back to sitting on the side of the bed? : Unable Difficulty sitting down on and standing up from a chair with arms (e.g., wheelchair, bedside commode, etc,.)?: Unable Help needed moving to and from a bed to chair (including a wheelchair)?: A Little Help needed walking in hospital room?: A Little Help needed climbing 3-5 steps with a railing? : A Little 6 Click Score: 12    End of Session Equipment Utilized During Treatment: Gait belt Activity  Tolerance: Patient tolerated treatment well;Patient limited by fatigue Patient left: in bed;with call bell/phone within reach Nurse Communication: Mobility status PT Visit Diagnosis: Unsteadiness on feet (R26.81);Other symptoms and signs involving the nervous system (R29.898)     Time: 1610-9604 PT Time Calculation (min) (ACUTE ONLY): 21 min  Charges:  $Gait Training: 8-22 mins                    G Codes:       Alben Deeds, PT DPT  Board Certified Neurologic Specialist [place] 07/13/2017, 3:31 PM

## 2017-07-14 LAB — BASIC METABOLIC PANEL
Anion gap: 5 (ref 5–15)
BUN: 14 mg/dL (ref 6–20)
CO2: 27 mmol/L (ref 22–32)
CREATININE: 1.7 mg/dL — AB (ref 0.44–1.00)
Calcium: 8.5 mg/dL — ABNORMAL LOW (ref 8.9–10.3)
Chloride: 106 mmol/L (ref 101–111)
GFR calc Af Amer: 41 mL/min — ABNORMAL LOW (ref >60)
GFR calc non Af Amer: 36 mL/min — ABNORMAL LOW (ref >60)
GLUCOSE: 95 mg/dL (ref 65–99)
POTASSIUM: 3.6 mmol/L (ref 3.5–5.1)
Sodium: 138 mmol/L (ref 135–145)

## 2017-07-14 MED ORDER — CHLORTHALIDONE 50 MG PO TABS: 50.0000 mg | ORAL_TABLET | Freq: Every day | ORAL | 0 refills | Status: DC

## 2017-07-14 MED ORDER — AMLODIPINE BESYLATE 10 MG PO TABS: 10.0000 mg | ORAL_TABLET | Freq: Every day | ORAL | 0 refills | Status: DC

## 2017-07-14 MED FILL — CHLORTHALIDONE 50 MG TABLET: 50 | 30 days supply | Qty: 30 | Fill #0

## 2017-07-14 MED FILL — AMLODIPINE BESYLATE 10 MG T: 10 | 30 days supply | Qty: 30 | Fill #0

## 2017-07-14 NOTE — Care Management Note (Signed)
Case Management Note  Patient Details  Name: Amee Boothe MRN: [id number] Date of Birth: 1975-10-02  Subjective/Objective:                    Action/Plan: Pt discharging home with charity [facility] services. Pt has appt at [place] for f/u. Pt to use [facility] for her pharmacy needs and will pick up her medications today from there.  Pt states she has transportation home.   Expected Discharge Date:  07/14/17               Expected Discharge Plan:  [place]  In-House Referral:     Discharge planning Services  CM Consult, Medication Assistance, [facility]  Post Acute Care Choice:  Home Health Choice offered to:  Patient  DME Arranged:    DME Agency:     HH Arranged:  PT [place]:  [employer](charity )  Status of Service:  Completed, signed off  If discussed at [place] of Stay Meetings, dates discussed:    Additional Comments:  Pollie Friar, RN 07/14/2017, 1:43 PM

## 2017-07-14 NOTE — Progress Notes (Signed)
Patient has declined the CPAP at this time

## 2017-07-14 NOTE — Progress Notes (Signed)
   Subjective:  Patient reclining in bed this morning, she reports feeling better. Denies numbness or weakness. Was able to work with PT yesterday, amenable to [facility] PT on discharge. She is planning on switching her PCP to [place], will set up [facility] visit for 1 time hospital follow up in IMTS clinic.  Objective:  Vital signs in last 24 hours: Vitals:   07/13/17 2300 07/13/17 2341 07/14/17 0259 07/14/17 0800  BP: (!) 174/89 (!) 186/87 132/77 (!) 156/77  Pulse:  (!) 59 (!) 57 (!) 59  Resp: 18 18  17   Temp:  98.3 F (36.8 C)  98.4 F (36.9 C)  TempSrc:  Oral  Oral  SpO2:  95%  100%  Weight:      Height:       GEN: Lying in bed in NAD CV: NR & RR, no m/r/g RESP: CTAB, no wheezes or rales NEURO: CN II-XII grossly intact. Moving all extremities spontaneously.  Assessment/Plan:  Principal Problem:   PRES (posterior reversible encephalopathy syndrome) Active Problems:   Paresthesias  Ms. Deakins is a 42yo female with PMH of OSA, CKD, HTN, hx of intracranial bleed, and obesity who is being managed for severe symptomatic hypertension, which has improved with anti-hypertensive therapy. Currently on amlodipine and HCTZ, will discharge with plan for close follow up in our clinic and then with PCP  Severe symptomatic hypertension, improved Chronic hypertension BP 156/77 this AM. Symptoms have improved. - Continue amlodipine 10mg  daily - Switch HCTZ to chlorthalidone 50mg  daily on discharge - Holding home lisinopril 2/2 AKI - Avoiding beta blocker given bradycardia and borderline prolonged PR interval - PT/OT eval -> HH PT - Discharge today - F/u in IMTS clinic on 6/4  AKI on CKD Creatinine improved at 1.7 this morning with improved BP control, still above baseline ~1.3-1.4. - Holding home lisinopril - Will continue management of HTN as above - F/u in IMTS clinic for re-check  Dispo: Anticipated discharge today.  Colbert Ewing, MD 07/14/2017, 11:56 AM Pager: Mamie Nick  [phone]

## 2017-07-19 ENCOUNTER — Emergency Department (HOSPITAL_COMMUNITY)
Admission: EM | Admit: 2017-07-19 | Discharge: 2017-07-19 | Disposition: A | Discharge status: Home / Self Care | Payer: Self-pay | Attending: Emergency Medicine | Admitting: Emergency Medicine

## 2017-07-19 ENCOUNTER — Encounter: Payer: Self-pay | Admitting: Pediatric Intensive Care

## 2017-07-19 ENCOUNTER — Encounter (HOSPITAL_COMMUNITY): Payer: Self-pay | Admitting: Emergency Medicine

## 2017-07-19 ENCOUNTER — Emergency Department (HOSPITAL_COMMUNITY): Payer: Self-pay

## 2017-07-19 DIAGNOSIS — Z79899 Other long term (current) drug therapy: Secondary | ICD-10-CM | POA: Insufficient documentation

## 2017-07-19 DIAGNOSIS — I129 Hypertensive chronic kidney disease with stage 1 through stage 4 chronic kidney disease, or unspecified chronic kidney disease: Secondary | ICD-10-CM | POA: Insufficient documentation

## 2017-07-19 DIAGNOSIS — R42 Dizziness and giddiness: Secondary | ICD-10-CM | POA: Insufficient documentation

## 2017-07-19 DIAGNOSIS — I10 Essential (primary) hypertension: Secondary | ICD-10-CM

## 2017-07-19 DIAGNOSIS — Z91040 Latex allergy status: Secondary | ICD-10-CM | POA: Insufficient documentation

## 2017-07-19 DIAGNOSIS — Z8673 Personal history of transient ischemic attack (TIA), and cerebral infarction without residual deficits: Secondary | ICD-10-CM | POA: Insufficient documentation

## 2017-07-19 DIAGNOSIS — N183 Chronic kidney disease, stage 3 (moderate): Secondary | ICD-10-CM | POA: Insufficient documentation

## 2017-07-19 LAB — CBC
HCT: 37.7 % (ref 36.0–46.0)
Hemoglobin: 12.3 g/dL (ref 12.0–15.0)
MCH: 24.4 pg — ABNORMAL LOW (ref 26.0–34.0)
MCHC: 32.6 g/dL (ref 30.0–36.0)
MCV: 74.7 fL — AB (ref 78.0–100.0)
PLATELETS: 358 10*3/uL (ref 150–400)
RBC: 5.05 MIL/uL (ref 3.87–5.11)
RDW: 17.2 % — AB (ref 11.5–15.5)
WBC: 7.5 10*3/uL (ref 4.0–10.5)

## 2017-07-19 LAB — BASIC METABOLIC PANEL
Anion gap: 10 (ref 5–15)
BUN: 15 mg/dL (ref 6–20)
CALCIUM: 8.9 mg/dL (ref 8.9–10.3)
CO2: 26 mmol/L (ref 22–32)
CREATININE: 1.47 mg/dL — AB (ref 0.44–1.00)
Chloride: 104 mmol/L (ref 101–111)
GFR calc non Af Amer: 42 mL/min — ABNORMAL LOW (ref >60)
GFR, EST AFRICAN AMERICAN: 49 mL/min — AB (ref >60)
Glucose, Bld: 95 mg/dL (ref 65–99)
Potassium: 3.7 mmol/L (ref 3.5–5.1)
SODIUM: 140 mmol/L (ref 135–145)

## 2017-07-19 MED ORDER — ACETAMINOPHEN 500 MG PO TABS: 1000.0000 mg | ORAL_TABLET | Freq: Four times a day (QID) | ORAL | 0 refills | Status: AC | PRN

## 2017-07-19 MED ORDER — ACETAMINOPHEN 500 MG PO TABS
1000.0000 mg | ORAL_TABLET | Freq: Once | ORAL | Status: AC
  Administered 2017-07-19: 1000 mg via ORAL
  Filled 2017-07-19: qty 2

## 2017-07-19 NOTE — ED Provider Notes (Addendum)
[place] EMERGENCY DEPARTMENT Provider Note   CSN: [id number] Arrival date & time: 07/19/17  1140     History   Chief Complaint Chief Complaint  Patient presents with  . Headache  . Hypertension    HPI Belinda Ramirez is a 42 y.o. female with a history of CKD stage III, PRES, and HTN who presents to the emergency department with a chief complaint of headache.  The patient reports that she is a resident at [facility] and started to feel "off" so she asked for a nurse to take her blood pressure.  She reports that it was elevated, but she is unsure of how high.  She reports that she also developed a headache and dizziness while her blood pressure was elevated.  Patient was most recently discharged from the hospital on 07/11/2017 for PRES, symptom medic hypertension, and AKI after she had been having 2 to 4 weeks of left-sided paresthesias, blurry vision, headaches, and slurred speech.  MRI of the brain at that time were negative for CVA or acute intracranial bleed,  but was concerning for PRES. discharged with amlodipine and chlorthalidone.  She has a follow-up appointment with her PCP on 07/21/17.   She denies paresthesias, blurred vision, slurred speech, fever, chills, neck pain, stiffness, lightheadedness, syncope, seizures, chest pain, dyspnea, difficulty voiding, N/V/D, or abdominal pain.   She has been compliant with her home medications.  The history is provided by the patient. No language interpreter was used.  Headache   Pertinent negatives include no fever, no shortness of breath, no nausea and no vomiting.  Hypertension  Associated symptoms include headaches. Pertinent negatives include no chest pain, no abdominal pain and no shortness of breath.    Past Medical History:  Diagnosis Date  . 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 High cholesterol   . History of cerebral hemorrhage   . Hypertension   . Obesity   . Sickle cell trait  ([place])   . Stroke [facility])     Patient Active Problem List   Diagnosis Date Noted  . Essential hypertension 07/21/2017  . CKD (chronic kidney disease), stage III ([place]) 01/22/2017  . OSA (obstructive sleep apnea) 06/19/2015  . HLD (hyperlipidemia)   . Morbid obesity due to excess calories ([place])   . Hyperlipidemia     Past Surgical History:  Procedure Laterality Date  . TUBAL LIGATION       OB History   None      Home Medications    Prior to Admission medications   Medication Sig Start Date End Date Taking? Authorizing Provider  Multiple Vitamins-Minerals (ADULT GUMMY) CHEW Chew 1 tablet by mouth at bedtime.    Yes [provider]  acetaminophen (TYLENOL) 500 MG tablet Take 2 tablets (1,000 mg total) by mouth every 6 (six) hours as needed. 07/19/17   McDonald, Mia A, PA-C  amLODipine (NORVASC) 10 MG tablet Take 1 tablet (10 mg total) by mouth daily. IM Program. 07/21/17   Melanee Spry, MD  atorvastatin (LIPITOR) 40 MG tablet Take 1 tablet (40 mg total) by mouth daily at 6 PM. Patient not taking: Reported on 01/22/2017 05/06/15   Rinehuls, Early Chars, PA-C  chlorthalidone (HYGROTON) 50 MG tablet Take 1 tablet (50 mg total) by mouth daily. IM Program 07/21/17   Melanee Spry, MD  lisinopril (PRINIVIL,ZESTRIL) 20 MG tablet Take 1 tablet (20 mg total) by mouth daily. 07/21/17 07/21/18  Melanee Spry, MD    Family History  Family History  Problem Relation Age of Onset  . Heart attack Maternal Grandfather   . Diabetes Mother   . Hypertension Mother   . Cancer Father   . Heart disease Maternal Grandmother     Social History Social History   Tobacco Use  . Smoking status: Never Smoker  . Smokeless tobacco: Never Used  Substance Use Topics  . Alcohol use: Yes    Alcohol/week: 0.0 oz    Comment: occaisonal  . Drug use: No     Allergies   Oxycodone; Percocet [oxycodone-acetaminophen]; Amoxicillin; Aspirin; and Latex   Review of Systems Review of Systems    Constitutional: Negative for activity change, chills and fever.  HENT: Negative for congestion and sore throat.   Eyes: Negative for visual disturbance.  Respiratory: Negative for shortness of breath.   Cardiovascular: Negative for chest pain.  Gastrointestinal: Negative for abdominal pain, diarrhea, nausea and vomiting.  Genitourinary: Negative for difficulty urinating and dysuria.  Musculoskeletal: Negative for back pain, neck pain and neck stiffness.  Skin: Negative for rash.  Allergic/Immunologic: Negative for immunocompromised state.  Neurological: Positive for headaches. Negative for seizures, syncope, weakness and numbness.  Psychiatric/Behavioral: Negative for confusion.     Physical Exam Updated Vital Signs BP (!) 144/80 (BP Location: Right Arm)   Pulse 67   Temp 98.3 F (36.8 C)   Resp 16   SpO2 100%   Physical Exam  Constitutional: No distress.  HENT:  Head: Normocephalic.  Eyes: Pupils are equal, round, and reactive to light. Conjunctivae and EOM are normal. Right eye exhibits no discharge. Left eye exhibits no discharge.  Neck: Normal range of motion. Neck supple.  Cardiovascular: Normal rate, regular rhythm, normal heart sounds and intact distal pulses. Exam reveals no gallop and no friction rub.  No murmur heard. Pulmonary/Chest: Effort normal. No stridor. No respiratory distress. She has no wheezes. She has no rales. She exhibits no tenderness.  Abdominal: Soft. She exhibits no distension. There is no tenderness.  Musculoskeletal: Normal range of motion. She exhibits no edema, tenderness or deformity.  Neurological: She is alert.  CN II-XII are grossly intact.  GCS 15.  Speaks in complete, fluent sentences.  Moves all 4 extremities.  5 out of 5 strength against resistance of bilateral upper enlarged remedies.  Sensation is intact and symmetric throughout.  No pronator drift.  Negative Romberg.  Symmetric tandem gait.  Skin: Skin is warm. Capillary refill takes  less than 2 seconds. No rash noted. She is not diaphoretic.  Psychiatric: Her behavior is normal.  Nursing note and vitals reviewed.    ED Treatments / Results  Labs (all labs ordered are listed, but only abnormal results are displayed) Labs Reviewed  BASIC METABOLIC PANEL - Abnormal; Notable for the following components:      Result Value   Creatinine, Ser 1.47 (*)    GFR calc non Af Amer 42 (*)    GFR calc Af Amer 49 (*)    All other components within normal limits  CBC - Abnormal; Notable for the following components:   MCV 74.7 (*)    MCH 24.4 (*)    RDW 17.2 (*)    All other components within normal limits    EKG None   EKG Interpretation  Date/Time: 07/19/17 10:21    Ventricular Rate: 81   PR Interval: 230   QRS Duration: 34   QT Interval: 419   QTC Calculation: 487    Text Interpretation: Normal rate. Sinus rhythm. Prolonged  PR interval.           Radiology No results found.  Procedures Procedures (including critical care time)  Medications Ordered in ED Medications  acetaminophen (TYLENOL) tablet 1,000 mg (1,000 mg Oral Given 07/19/17 1756)     Initial Impression / Assessment and Plan / ED Course  I have reviewed the triage vital signs and the nursing notes.  Pertinent labs & imaging results that were available during my care of the patient were reviewed by me and considered in my medical decision making (see chart for details).     42 year old with a history of CKD stage III, PRES, and HTN who presents to the emergency department with a chief complaint of headache. BP 182/109 on arrival improving to 144/80 without treatment in the ED. given complaints of dizziness, CT head was ordered, which was negative for acute findings.  Tylenol given for headache.  On recheck, her headache had resolved.  Doubt hypertensive urgency, CVA, SAH, or ICH.  She has a follow-up appointment with her PCP in 2 days.  Given that she is currently asymptomatic and blood pressure  has improved, I feel that the patient is hemodynamically stable and safe for discharge home with outpatient follow-up.  She was given strict return precautions to the ED.  Final Clinical Impressions(s) / ED Diagnoses   Final diagnoses:  Essential hypertension    ED Discharge Orders        Ordered    acetaminophen (TYLENOL) 500 MG tablet  Every 6 hours PRN     07/19/17 1942       McDonald, Laymond Purser, PA-C 07/19/17 2353    Davonna Belling, MD 07/20/17 2155    Joline Maxcy A, PA-C 08/01/17 1732    Davonna Belling, MD 08/01/17 2356

## 2017-07-19 NOTE — ED Notes (Signed)
Pt informed she needs to provide a urine sample when possible. Pt verbalized understanding.

## 2017-07-19 NOTE — ED Triage Notes (Signed)
Pt states she was just discharged from the hospital last Thursday for HTN. Pt states today [facility] she just felt "werid" and like her blood pressure was elevated and she also reports a headache.

## 2017-07-19 NOTE — Congregational Nurse Program (Signed)
Congregational Nurse Program Note  Date of Encounter: 07/19/2017  Past Medical History: Past Medical History:  Diagnosis Date  . High cholesterol   . Hypertension   . Obesity   . Sickle cell trait ([place])   . Stroke [facility])     Encounter Details: CNP Questionnaire - 07/19/17 1040      Questionnaire   Patient Status  Not Applicable    Race  Black or African American    Location Patient Served At  [employer]  Not Applicable    Uninsured  Uninsured (NEW 1x/quarter)    Food  No food insecurities    Housing/Utilities  No permanent housing    Transportation  No transportation needs    Interpersonal Safety  Yes, feel physically and emotionally safe where you currently live    Medication  No medication insecurities    Medical Provider  Yes    Referrals  Emergency Department    ED Visit Averted  Not Applicable    Life-Saving Intervention Made  Not Applicable      New client encounter- client reports a history a stroke and hypertension. States that she "doesn't feel well and is very tired". Reports that she took her medication last night. Bilateral grips equal. No facial droop. Client is speaking very slowly and it is difficult to assess if this is client baseline. Given client BP and history, CN advises emergency department. Husband will take client to ED.

## 2017-07-19 NOTE — ED Notes (Signed)
Pt transported to CT ?

## 2017-07-19 NOTE — Discharge Instructions (Signed)
Thank you for allowing me to provide your care today in the emergency department.  Please keep your appointment on Thursday with your primary care provider.  Make sure they recheck your blood pressure.  Please keep taking your home blood pressure medications as prescribed.  Take 1000 mg of tylenol once every 8 hours for headache.   Return to the emergency department if your blood pressure significantly elevates and you develop other symptoms such as chest pain, changes in her vision, the worst headache of her life, or if you become unable to pee.

## 2017-07-21 ENCOUNTER — Encounter: Payer: Self-pay | Admitting: Internal Medicine

## 2017-07-21 ENCOUNTER — Ambulatory Visit (INDEPENDENT_AMBULATORY_CARE_PROVIDER_SITE_OTHER): Payer: Self-pay | Admitting: Internal Medicine

## 2017-07-21 DIAGNOSIS — Z79899 Other long term (current) drug therapy: Secondary | ICD-10-CM

## 2017-07-21 DIAGNOSIS — I129 Hypertensive chronic kidney disease with stage 1 through stage 4 chronic kidney disease, or unspecified chronic kidney disease: Secondary | ICD-10-CM

## 2017-07-21 DIAGNOSIS — I10 Essential (primary) hypertension: Secondary | ICD-10-CM

## 2017-07-21 DIAGNOSIS — N189 Chronic kidney disease, unspecified: Secondary | ICD-10-CM

## 2017-07-21 MED ORDER — CHLORTHALIDONE 50 MG PO TABS: 50.0000 mg | ORAL_TABLET | Freq: Every day | ORAL | 3 refills | Status: DC

## 2017-07-21 MED ORDER — LISINOPRIL 20 MG PO TABS: 20.0000 mg | ORAL_TABLET | Freq: Every day | ORAL | 3 refills | Status: DC

## 2017-07-21 MED ORDER — AMLODIPINE BESYLATE 10 MG PO TABS: 10.0000 mg | ORAL_TABLET | Freq: Every day | ORAL | 3 refills | Status: DC

## 2017-07-21 MED FILL — LISINOPRIL 20 MG TABLET: 20 | 30 days supply | Qty: 30 | Fill #0

## 2017-07-21 NOTE — Assessment & Plan Note (Signed)
BP Readings from Last 3 Encounters:  07/21/17 (!) 177/109  07/19/17 (!) 144/80  07/19/17 (!) 170/112   Uncontrolled hypertension, currently asymptomatic. Patient's current regimen  Includes Chlorthalidone 50 mg and Amlodipine 10 mg daily. She was previously on lisinopril but this was held at discharge due to AKI on CKD. Recent BMP with creatinine of 1.47, which is near the patient's baseline.   Plan: -Start Lisinopril: patient to take 10 mg daily for 1 week duration, then start taking 20 mg daily  -Continue Chlorthalidone 50 mg -Continue Amlodipine 10 mg daily -RTC in 1 month for BP check and repeat lab work

## 2017-07-21 NOTE — Patient Instructions (Signed)
Belinda Ramirez,   It was a pleasure meeting you today.   I have restarted your Lisinopril. I want you to take 10 mg (half of a tablet) for 1 week duration. After 1 week, start taking 20 mg daily.   Continue the chlorthalidone and amlodipine as prescribed.   I have sent refills to the [facility].   Return to our clinic in  1 month for blood pressure recheck and lab work.

## 2017-07-21 NOTE — Progress Notes (Signed)
   CC: uncontrolled hypertension   HPI:  [name] is a 42 y.o. female with past medical history as documented below presenting for hospital follow up and management of uncontrolled hypertension. The patient was recently admitted to the hospital for severe symptomatic hypertension and PRES. She initially presented with weeks of left-sided paresthesias, blurry vision, headaches, and slurred speech. Systolic BP was elevated to 190s-200s on admission. Her blood pressure was gradually reduced and she was discharged on chlorthalidone 50 mg and amlodipine 10 mg, which she has been taking daily. She returned to the ED on 07/19/17 for headache. Upon arrival, her blood pressure was elevated but normalized without intervention. She was given her oral medications and discharged. She denies paresthesia, HA, blurry vision, or slurred speech. She would like to establish care in our clinic.   Past Medical History:  Diagnosis Date  . 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 High cholesterol   . History of cerebral hemorrhage   . Hypertension   . Obesity   . Sickle cell trait ([place])   . Stroke [facility])    Review of Systems:   Review of Systems  Constitutional: Negative for chills and fever.  Eyes: Negative for blurred vision.  Respiratory: Negative for shortness of breath.   Cardiovascular: Negative for chest pain.  Genitourinary: Negative.   Neurological: Negative for tingling, sensory change, speech change, focal weakness and headaches.    Physical Exam:  Vitals:   07/21/17 1451  BP: (!) 177/109  Pulse: 82  Temp: 99.1 F (37.3 C)  TempSrc: Oral  SpO2: 100%  Weight: 254 lb 12.8 oz (115.6 kg)  Height: 5\' 4"  (1.626 m)   General: Sitting in wheelchair comfortably, NAD HEENT: [place]/AT, EOMI, no scleral icterus, PERRL Cardiac: RRR, No R/M/G appreciated Pulm: normal effort, CTAB Abd: soft, non tender, non distended, BS normal Ext: extremities well perfused, no peripheral  edema Neuro: alert and oriented X3, cranial nerves II-XII grossly intact   Assessment & Plan:   See Encounters Tab for problem based charting.  Patient discussed with Dr. Evette Doffing

## 2017-07-22 NOTE — Progress Notes (Signed)
Internal Medicine Clinic Attending  Case discussed with Dr. LaCroce at the time of the visit.  We reviewed the resident's history and exam and pertinent patient test results.  I agree with the assessment, diagnosis, and plan of care documented in the resident's note.  

## 2017-08-09 ENCOUNTER — Encounter: Payer: Self-pay | Admitting: Pediatric Intensive Care

## 2017-08-10 ENCOUNTER — Inpatient Hospital Stay (INDEPENDENT_AMBULATORY_CARE_PROVIDER_SITE_OTHER): Payer: Self-pay | Admitting: Physician Assistant

## 2017-08-16 ENCOUNTER — Encounter: Payer: Self-pay | Admitting: Pediatric Intensive Care

## 2017-08-17 ENCOUNTER — Ambulatory Visit: Payer: Self-pay

## 2017-08-17 ENCOUNTER — Emergency Department (HOSPITAL_COMMUNITY): Payer: Self-pay

## 2017-08-17 ENCOUNTER — Ambulatory Visit (INDEPENDENT_AMBULATORY_CARE_PROVIDER_SITE_OTHER): Payer: Self-pay | Admitting: Internal Medicine

## 2017-08-17 ENCOUNTER — Emergency Department (HOSPITAL_COMMUNITY)
Admission: EM | Admit: 2017-08-17 | Discharge: 2017-08-17 | Disposition: A | Discharge status: Home / Self Care | Payer: Self-pay | Attending: Emergency Medicine | Admitting: Emergency Medicine

## 2017-08-17 ENCOUNTER — Other Ambulatory Visit: Payer: Self-pay

## 2017-08-17 ENCOUNTER — Encounter: Payer: Self-pay | Admitting: Internal Medicine

## 2017-08-17 VITALS — BP 141/80 | HR 68 | Temp 98.2°F | Ht 64.0 in | Wt 264.8 lb

## 2017-08-17 DIAGNOSIS — H538 Other visual disturbances: Secondary | ICD-10-CM | POA: Insufficient documentation

## 2017-08-17 DIAGNOSIS — Z6841 Body Mass Index (BMI) 40.0 and over, adult: Secondary | ICD-10-CM

## 2017-08-17 DIAGNOSIS — Z79899 Other long term (current) drug therapy: Secondary | ICD-10-CM | POA: Insufficient documentation

## 2017-08-17 DIAGNOSIS — N183 Chronic kidney disease, stage 3 (moderate): Secondary | ICD-10-CM | POA: Insufficient documentation

## 2017-08-17 DIAGNOSIS — Z9181 History of falling: Secondary | ICD-10-CM

## 2017-08-17 DIAGNOSIS — Z87448 Personal history of other diseases of urinary system: Secondary | ICD-10-CM

## 2017-08-17 DIAGNOSIS — I129 Hypertensive chronic kidney disease with stage 1 through stage 4 chronic kidney disease, or unspecified chronic kidney disease: Secondary | ICD-10-CM | POA: Insufficient documentation

## 2017-08-17 DIAGNOSIS — R260 Ataxic gait: Secondary | ICD-10-CM

## 2017-08-17 DIAGNOSIS — R32 Unspecified urinary incontinence: Secondary | ICD-10-CM | POA: Insufficient documentation

## 2017-08-17 DIAGNOSIS — Z8673 Personal history of transient ischemic attack (TIA), and cerebral infarction without residual deficits: Secondary | ICD-10-CM | POA: Insufficient documentation

## 2017-08-17 DIAGNOSIS — I10 Essential (primary) hypertension: Secondary | ICD-10-CM

## 2017-08-17 DIAGNOSIS — I639 Cerebral infarction, unspecified: Secondary | ICD-10-CM | POA: Insufficient documentation

## 2017-08-17 DIAGNOSIS — R2990 Unspecified symptoms and signs involving the nervous system: Secondary | ICD-10-CM | POA: Insufficient documentation

## 2017-08-17 MED ORDER — ATORVASTATIN CALCIUM 80 MG PO TABS: 80.0000 mg | ORAL_TABLET | Freq: Every day | ORAL | 2 refills | Status: DC

## 2017-08-17 MED FILL — ATORVASTATIN 80 MG TABLET: 80 | 30 days supply | Qty: 30 | Fill #0

## 2017-08-17 NOTE — ED Notes (Signed)
Patient transported to CT 

## 2017-08-17 NOTE — ED Notes (Signed)
Patient verbalizes understanding of discharge instructions. Opportunity for questioning and answers were provided. Armband removed by staff, pt discharged from ED.  

## 2017-08-17 NOTE — Assessment & Plan Note (Signed)
Assessment:  BP 141/80 with pulse 68 today. On Chlorthalidone 50mg  daily, Amlodipine 10 mg daily and now Lisinopril 20mg  daily.      Plan: BP much better controlled since addition of Lisinopril. She is being sent to ED for evaluation of recurrent blurred vision which, although unlikely, could be related to relative hypotension given her history of uncontrolled hypertension. Ultimately she needs good blood pressure control but will await results of her ED work-up.

## 2017-08-17 NOTE — Assessment & Plan Note (Addendum)
Assessment & Plan: Pt with history of both ICH and CVA. Patient not currently on statin (pt thought this was discontinued). Appropriately not on plavix nor asa given hx of ICH and chronic microhemorrhages.  -Start high-intensity statin today w/ Atorvastatin 80mg 

## 2017-08-17 NOTE — ED Notes (Signed)
ED Provider at bedside. 

## 2017-08-17 NOTE — ED Provider Notes (Signed)
[facility] EMERGENCY DEPARTMENT Provider Note   CSN: [id number] Arrival date & time: 08/17/17  1154     History   Chief Complaint Chief Complaint  Patient presents with  . Follow-up    HPI Belinda Ramirez is a 42 y.o. female presenting for evaluation of intermittent blurred vision.  Patient states over the past 3 weeks, she has had intermittent blurred vision from her right eye.  She states that when she is standing up and doing things, she will develop blurred vision of the right eye.  This lasts for about 10 minutes, resolves when she lays down and closes her eyes.  This happens once every couple days, last happened 3 days ago.  She is was at her primary care doctor this morning discussing her symptoms, and sent to the ER for further evaluation.  She has a history of CVA in 2018, with residual left-sided arm and leg weakness.  She was admitted for press last month, with similar visual symptoms.  She does not follow-up with an eye doctor.  She denies photophobia, eye pain, or eye drainage.  She denies headache, slurred speech, numbness, chest pain, shortness of breath, nausea, vomiting, abdominal pain, urinary symptoms, abnormal bowel movements.  She denies fall, trauma, or injury.  She denies dizziness or lightheadedness.  She currently has no symptoms.  HPI  Past Medical History:  Diagnosis Date  . High cholesterol   . 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 Hypertension   . Obesity   . Sickle cell trait ([place])   . Stroke ([place])   . Urinary incontinence    For years. Resolved 5/19 spontaneously    Patient Active Problem List   Diagnosis Date Noted  . Blurred vision 08/17/2017  . History of CVA (cerebrovascular accident) 08/17/2017  . Urinary incontinence   .  Severe hypertension 07/21/2017  . PRES (posterior reversible encephalopathy syndrome) 07/11/2017  . CKD (chronic kidney disease), stage III ([place]) 01/22/2017  . OSA (obstructive sleep apnea) 06/19/2015  . Morbid obesity due to excess calories ([place])   . Hyperlipidemia   . History of cerebral hemorrhage 04/16/2015    Past Surgical History:  Procedure Laterality Date  . TUBAL LIGATION       OB History   None      Home Medications    Prior to Admission medications   Medication Sig Start Date End Date Taking? Authorizing Provider  acetaminophen (TYLENOL) 500 MG tablet Take 2 tablets (1,000 mg total) by mouth every 6 (six) hours as needed. 07/19/17   McDonald, Mia A, PA-C  amLODipine (NORVASC) 10 MG tablet Take 1 tablet (10 mg total) by mouth daily. IM Program. 07/21/17   Melanee Spry, MD  atorvastatin (LIPITOR) 80 MG tablet Take 1 tablet (80 mg total) by mouth daily at 6 PM. 08/17/17   Molt, Bethany, DO  chlorthalidone (HYGROTON) 50 MG tablet Take 1 tablet (50 mg total) by mouth daily. IM Program 07/21/17   Melanee Spry, MD  lisinopril (PRINIVIL,ZESTRIL) 20 MG tablet Take 1 tablet (20 mg total) by mouth daily. 07/21/17 07/21/18  Melanee Spry, MD  Multiple Vitamins-Minerals (ADULT GUMMY) CHEW Chew 1 tablet by mouth at bedtime.     [provider]    Family History Family History  Problem Relation  Age of Onset  . Heart attack Maternal Grandfather   . Diabetes Mother   . Hypertension Mother   . Cancer Father   . Heart disease Maternal Grandmother     Social History Social History   Tobacco Use  . Smoking status: Never Smoker  . Smokeless tobacco: Never Used  Substance Use Topics  . Alcohol use: Yes    Alcohol/week: 0.0 oz    Comment: occaisonal  . Drug use: No     Allergies   Oxycodone; Percocet [oxycodone-acetaminophen]; Amoxicillin; Aspirin; and Latex   Review of Systems Review of Systems  Eyes: Positive for visual disturbance (intermittent,  none currently).  All other systems reviewed and are negative.    Physical Exam Updated Vital Signs BP 138/87 (BP Location: Right Arm)   Pulse 66   Temp 98.4 F (36.9 C) (Oral)   Resp 17   LMP 08/09/2017   SpO2 100%   Physical Exam  Constitutional: She is oriented to person, place, and time. She appears well-developed and well-nourished. No distress.  Appears in no distress  HENT:  Head: Normocephalic and atraumatic.  OP clear without tonsillar swelling or exudate.  Uvula midline with equal palate rise.  TMs nonerythematous and nonbulging bilaterally.  Eyes: Conjunctivae and EOM are normal.  EOMI and PERRLA.  Possible microhemorrhages noted on the retina of the right eye.   Neck: Normal range of motion. Neck supple.  Cardiovascular: Normal rate, regular rhythm and intact distal pulses.  Pulmonary/Chest: Effort normal and breath sounds normal. No respiratory distress. She has no wheezes.  Abdominal: Soft. She exhibits no distension and no mass. There is no tenderness. There is no guarding.  Musculoskeletal: Normal range of motion.  Left upper and lower extremity weakness, patient reports this is baseline.  Radial pedal pulses intact bilaterally.  Neurological: She is alert and oriented to person, place, and time. No cranial nerve deficit or sensory deficit. GCS eye subscore is 4. GCS verbal subscore is 5. GCS motor subscore is 6.  No obvious new neurologic deficits.  CN intact.  Residual left-sided weakness from previous stroke, unknown if change from baseline (pt states its not)  Skin: Skin is warm and dry. Capillary refill takes less than 2 seconds.  Psychiatric: She has a normal mood and affect.  Nursing note and vitals reviewed.    ED Treatments / Results  Labs (all labs ordered are listed, but only abnormal results are displayed) Labs Reviewed - No data to display  EKG None  Radiology Ct Head Wo Contrast  Result Date: 08/17/2017 CLINICAL DATA:  Blurred vision in the  right eye for 3 weeks. Headache. EXAM: CT HEAD WITHOUT CONTRAST TECHNIQUE: Contiguous axial images were obtained from the base of the skull through the vertex without intravenous contrast. COMPARISON:  07/19/2017 FINDINGS: Brain: No acute intracranial abnormality. Specifically, no hemorrhage, hydrocephalus, mass lesion, acute infarction, or significant intracranial injury. Vascular: No hyperdense vessel or unexpected calcification. Skull: No acute calvarial abnormality. Sinuses/Orbits: Visualized paranasal sinuses and mastoids clear. Orbital soft tissues unremarkable. Other: None IMPRESSION: No acute intracranial abnormality. Electronically Signed   By: Rolm Baptise M.D.   On: 08/17/2017 13:37    Procedures Procedures (including critical care time)  Medications Ordered in ED Medications - No data to display   Initial Impression / Assessment and Plan / ED Course  I have reviewed the triage vital signs and the nursing notes.  Pertinent labs & imaging results that were available during my care of the patient were reviewed  by me and considered in my medical decision making (see chart for details).     Patient presenting for evaluation of intermittent right eye blurred vision.  Physical exam reassuring, no new obvious neurologic deficits.  Patient without symptoms at this time.  Has not had any blurred vision in several days.  Vital signs reassuring, blood pressure normal.  As patient was sent to the ER to get a scan, will obtain CT head.  Visual acuity obtained.  CT head without acute findings.  Visual acuity similar bilaterally.  On exam, concern for possible small retinal hemorrhages of the right eye.  I believe patient needs to follow-up with ophthalmology for further evaluation and management.  Discussed with patient.  At this time, doubt CVA, TIA, infection, press, or hypertensive urgency. Discussed with attending, Dr. Rex Kras agrees to plan.  At this time, patient appears a for discharge.  Return  precautions given.  Patient states she understands and agrees to plan.   Final Clinical Impressions(s) / ED Diagnoses   Final diagnoses:  Blurred vision, right eye    ED Discharge Orders    None       Franchot Heidelberg, PA-C 08/17/17 1349    Little, Wenda Overland, MD 08/19/17 [phone]

## 2017-08-17 NOTE — ED Triage Notes (Signed)
Pt reports that she went to internal medicine today for follow up from previous stroke a year ago. Pt told her doctor that for the past 3 weeks she has had intermittent blurred vision, they want her to have follow up CT scan. Pt A&OX4 in triage.

## 2017-08-17 NOTE — Assessment & Plan Note (Addendum)
Assessment:  Patient w/ history of ICH, CVA and PRES here with recurrent blurred vision which she notes is the same as her recent admission. This (in addition to confusion, L-sided paraesthesias and slurred speech) were felt to be consistent with PRES given her significant BP elevation, imaging findings and improvement with better BP control.    In clinic 2-month ago, BP was 177/100 and Lisinopril was resumed (10mg  x1 wk, 20mg  the following wk) and she noticed recurrent blurred vision just after the dose increase. Symptoms have persisted since then and she describes central vision loss which she notices mostly in the mornings and when she bends over or flexes her head forward. Initially told me she experienced this today, but when presented with idea of going to ED her story changed to yesterday.   She continues to have difficulty ambulating, stating her left leg doesn't respond like she wants it to and often stumbles and/or falls. Mobility and balance issues were noted during admission and [facility] PT services were recommended. Pt notes this hasn't started, but thinks they might be waiting until she moves out of her shelter to start.      Plan: Her recurrent blurred vision is concerning, especially in the setting of continued ambulation difficulties w/ improved BP. While her BP is much improved today and it is possible that her BP could be "too-low" given her baseline degree and duration of hypertension.   It could also be a primary ophthalmologic issue; however given the additional history of urinary incontinence, confusion at prior admission, and prior imaging studies with hydrocephalus, I wonder if patient could have NPH. She denies ever having a LP and ophthalmologic exam in clinic was limited but may have appreciated some abnormalities in the right eye.   Patient sent to ED for prompt evaluation. She will require further imaging, hopefully an ophthalmologic exam (to evaluate for papiledema) and metabolic  panel.

## 2017-08-17 NOTE — Progress Notes (Addendum)
   CC: follow-up of HTN, recurrent blurred vision  HPI:  Belinda Ramirez is a 42 y.o. F with morbid obesity, HTN and recent admission for PRES manefested by confusion, blurred vision, L-parasthesia's, ataxia and slurred speech, history of persistent urinary incontinence resolved a few weeks ago, history of ICH 3/17 and history of CVA 2/18 who presents today for BP follow-up, recurrence of her blurred vision and continued ataxia.  She reports return of blurred vision which she states is the same as her symptoms 84-month ago during admission for PRES. First noticed its return 3 weeks ago after increasing her Lisinopril from 10mg  to 20mg  and has experienced this intermittently since then. Episodes occur in the morning and when she bends her head forward and looks up. Initially stated it occurred this morning but later stated it occurred yesterday.   Does not feel she's had slurred speech but not certain. Still with ambulation difficulty and has fallen when she tries ambulating without a cane. Reports her left-leg "wont respond" like she wants to sometimes. No LOC. Endorses remote history of persistent urinary incontinence but this reportedly resolved on its own during her last hospitalization. Denies ever having an LP.   For details regarding today's visit and the status of their chronic medical issues, please refer to the assessment and plan.  Past Medical History:  Diagnosis Date  . High cholesterol   . 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 Hypertension   . Obesity   . Sickle cell trait ([place])   . Stroke ([place])   . Urinary incontinence    For years. Resolved 5/19 spontaneously   Review of Systems:   General: Admits to fatigue, feeling cold, headache. Denies fevers, chills or LOC HEENT:  Admits to blurred vision and occasional central vision loss. Denies sore throat, dysphagia or slurred speech  Cardiac: Denies CP or palpitations Pulmonary: Denies bothersome cough or SOB Abd: Denies nausea, vomiting, abdominal pain, diarrhea or hematochezia Extremities: Admits to LLE weakness and "clumsiness" Admits to stumbling and falls. Denies swelling GU: Denied dysuria or recent urinary incontinence   Physical Exam: General: Alert, in no acute distress. Pleasant and conversant HEENT: Facial muscles symmetric. No icterus, injection or ptosis. No hoarseness or dysarthria. Ophthalmoscopic exam limited in clinic but ?hemorrhages right eye? Cardiac: RRR, no MGR appreciated Pulmonary: CTA BL with normal WOB on RA. Able to speak in complete sentences Abd: Obese abdomen. Soft, non-tender. +bs Extremities: Warm, perfused. No significant pedal edema.  Neuro: EOMI. Strength 5/5 BL UE with intact sensation. Strength 4/5 LLE and 5/5 RLE. Ambulates with limp, uses cane.  Psych: Poor historian, timeline changes often  Vitals:   08/17/17 1048  BP: (!) 141/80  Pulse: 68  Temp: 98.2 F (36.8 C)  TempSrc: Oral  SpO2: 100%  Weight: 264 lb 12.8 oz (120.1 kg)  Height: 5\' 4"  (1.626 m)   Body mass index is 45.45 kg/m.  Assessment & Plan:   See Encounters Tab for problem based charting.  Patient discussed with Dr. Dareen Piano

## 2017-08-17 NOTE — Discharge Instructions (Addendum)
It is important that you contact the ophthalmologist for further evaluation and management of your blood vision. Continue taking all your home medications as prescribed. Return to the emergency room if you develop severe headache, numbness, weakness, slurred speech, or any new or concerning symptoms.

## 2017-08-17 NOTE — Patient Instructions (Addendum)
It was nice seeing you today. Thank you for choosing [facility] for your Primary Care.   Today we talked about:  1) Blurred Vision: Belinda Ramirez, it is concerning that your blurred vision has reoccurred, especially in the absence of high blood pressure. I think given this clinical change, it's important to get repeat imaging of your brain to ensure no other process is going on. We recommend going to the Emergency Department from this appointment for evaluation.  2) I've also started a medication for cholesterol given your history of stroke. This medication is free or $4 at the [facility]. It is waiting for you.  3) You have an appointment with Neurology 8/7. Please attend this appointment.   FOLLOW-UP INSTRUCTIONS When: 2 weeks (PCP next avail) For: BP, Blurred vision. What to bring: Medications  Please contact the clinic if you have any problems, or need to be seen sooner.

## 2017-08-19 MED FILL — LISINOPRIL 20 MG TABLET: 20 | 30 days supply | Qty: 30 | Fill #1

## 2017-08-19 MED FILL — AMLODIPINE BESYLATE 10 MG T: 10 | 30 days supply | Qty: 30 | Fill #0

## 2017-08-19 MED FILL — CHLORTHALIDONE 50 MG TAB: 50 | 30 days supply | Qty: 30 | Fill #0

## 2017-08-22 ENCOUNTER — Encounter: Payer: Self-pay | Admitting: Pediatric Intensive Care

## 2017-08-22 NOTE — Progress Notes (Signed)
Internal Medicine Clinic Attending  Case discussed with Dr. Molt at the time of the visit.  We reviewed the resident's history and exam and pertinent patient test results.  I agree with the assessment, diagnosis, and plan of care documented in the resident's note. 

## 2017-08-23 ENCOUNTER — Encounter: Payer: Self-pay | Admitting: Pediatric Intensive Care

## 2017-08-24 NOTE — Congregational Nurse Program (Signed)
Congregational Nurse Program Note  Date of Encounter: 08/09/2017  Past Medical History: Past Medical History:  Diagnosis Date  . High cholesterol   . 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 Hypertension   . Obesity   . Sickle cell trait ([place])   . Stroke ([place])   . Urinary incontinence    For years. Resolved 5/19 spontaneously    Encounter Details: CNP Questionnaire - 08/09/17 0900      Questionnaire   Patient Status  Not Applicable    Race  Black or African American    Location Patient Served At  [employer]  Not Applicable    Uninsured  Uninsured (Subsequent visits/quarter)    Food  No food insecurities    Housing/Utilities  No permanent housing    Transportation  No transportation needs    Interpersonal Safety  Yes, feel physically and emotionally safe where you currently live    Medication  No medication insecurities    Medical Provider  Yes    Referrals  Not Applicable    ED Visit Averted  Not Applicable    Life-Saving Intervention Made  Not Applicable      Clinical Intake - 08/17/17 1052      Pain   Pain   No/denies pain      Nutrition Screen   BMI - recorded  45.45    Nutritional Status  BMI > 30  Obese    Nutritional Risks  None    Diabetes  No      Functional Status   Activities of Daily Living  Independent    Ambulation  Independent with device- listed below    Home Assistive Devices/Equipment  Walker (specify Type)    Medication Administration  Independent    Home Management  -- IN [place]   IN [place]     Abuse/Neglect   Do you feel unsafe in your current relationship?  No    Do you feel physically threatened by others?  No    Anyone hurting you at home, work, or school?  No    Unable to ask?  No      Patient Literacy   How often do you need  to have someone help you when you read instructions, pamphlets, or other written materials from your doctor or pharmacy?  2 - Rarely RECENT STROKE / VISION   RECENT STROKE / VISION   What is the last grade level you completed in school?  [employer] GRADE      Investment banker, operational Needed?  No      Comments   Information entered by :  Lucky Rathke NTII [id number]   10:54AM     Declines BP check. States that she's having incaresed pain in her lower extremities. CN advised she does not prescribe or keep medication in office. Client will schedule MD appointment.

## 2017-09-09 ENCOUNTER — Encounter: Payer: Self-pay | Admitting: Pediatric Intensive Care

## 2017-09-20 NOTE — Congregational Nurse Program (Signed)
Congregational Nurse Program Note  Date of Encounter: 08/16/2017  Past Medical History: Past Medical History:  Diagnosis Date  . High cholesterol   . 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 Hypertension   . Obesity   . Sickle cell trait ([place])   . Stroke ([place])   . Urinary incontinence    For years. Resolved 5/19 spontaneously    Encounter Details:  BP check. Provided bus passes for PCP appointment.

## 2017-09-21 ENCOUNTER — Telehealth: Payer: Self-pay

## 2017-09-21 ENCOUNTER — Institutional Professional Consult (permissible substitution): Payer: PRIVATE HEALTH INSURANCE | Admitting: Neurology

## 2017-09-21 NOTE — Congregational Nurse Program (Signed)
Congregational Nurse Program Note  Date of Encounter: 08/22/2017  Past Medical History: Past Medical History:  Diagnosis Date  . High cholesterol   . 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 Hypertension   . Obesity   . Sickle cell trait ([place])   . Stroke ([place])   . Urinary incontinence    For years. Resolved 5/19 spontaneously    Encounter Details: BP check. Client states she has had continued issues with blurred vision and her PCP wants her to see an opthomologist. CN assisted with SCAT application.

## 2017-09-21 NOTE — Congregational Nurse Program (Signed)
Congregational Nurse Program Note  Date of Encounter: 08/23/2017  Past Medical History: Past Medical History:  Diagnosis Date  . High cholesterol   . 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 Hypertension   . Obesity   . Sickle cell trait ([place])   . Stroke ([place])   . Urinary incontinence    For years. Resolved 5/19 spontaneously    Encounter Details: CNP Questionnaire - 08/23/17 0900      Questionnaire   Patient Status  Not Applicable    Race  Black or African American    Location Patient Served At  [employer]  Not Applicable    Uninsured  Uninsured (Subsequent visits/quarter)    Food  No food insecurities    Housing/Utilities  No permanent housing    Transportation  No transportation needs    Interpersonal Safety  Yes, feel physically and emotionally safe where you currently live    Medication  No medication insecurities    Medical Provider  Yes    Referrals  Not Applicable    ED Visit Averted  Not Applicable    Life-Saving Intervention Made  Not Applicable     BP check.

## 2017-09-21 NOTE — Telephone Encounter (Signed)
Patient was a no show for her NP visit with Dr. Rexene Alberts today.

## 2017-09-22 ENCOUNTER — Encounter: Payer: Self-pay | Admitting: Neurology

## 2017-09-23 ENCOUNTER — Encounter: Payer: Self-pay | Admitting: Internal Medicine

## 2017-09-23 ENCOUNTER — Other Ambulatory Visit: Payer: Self-pay

## 2017-09-23 ENCOUNTER — Encounter: Payer: Self-pay | Admitting: Pediatric Intensive Care

## 2017-09-23 ENCOUNTER — Ambulatory Visit (INDEPENDENT_AMBULATORY_CARE_PROVIDER_SITE_OTHER): Payer: Self-pay | Admitting: Internal Medicine

## 2017-09-23 VITALS — BP 158/72 | HR 67 | Temp 99.2°F | Ht 64.0 in | Wt 273.5 lb

## 2017-09-23 DIAGNOSIS — N3946 Mixed incontinence: Secondary | ICD-10-CM

## 2017-09-23 DIAGNOSIS — Z79899 Other long term (current) drug therapy: Secondary | ICD-10-CM

## 2017-09-23 DIAGNOSIS — I10 Essential (primary) hypertension: Secondary | ICD-10-CM

## 2017-09-23 MED ORDER — CHLORTHALIDONE 50 MG PO TABS: 50.0000 mg | ORAL_TABLET | Freq: Every day | ORAL | 3 refills | Status: DC

## 2017-09-23 MED ORDER — LISINOPRIL 20 MG PO TABS: 20.0000 mg | ORAL_TABLET | Freq: Every day | ORAL | 3 refills | Status: DC

## 2017-09-23 MED ORDER — AMLODIPINE BESYLATE 10 MG PO TABS: 10.0000 mg | ORAL_TABLET | Freq: Every day | ORAL | 3 refills | Status: DC

## 2017-09-23 MED FILL — CHLORTHALIDONE 50 MG TABLET: 50 | 30 days supply | Qty: 30 | Fill #0

## 2017-09-23 MED FILL — AMLODIPINE BESYLATE 10 MG T: 10 | 30 days supply | Qty: 30 | Fill #0

## 2017-09-23 MED FILL — LISINOPRIL 20 MG TAB: 20 | 30 days supply | Qty: 30 | Fill #0

## 2017-09-23 NOTE — Progress Notes (Signed)
   CC: BP check, med refill, urinary incontinence   HPI:  [name] is a 42 y.o. with past medical history as listed below.  Hypertension:  Reports at home blood pressure readings has been within normal limits.  BP checked 2 days ago was 128/78.  BP initially elevated at today's clinic at 155/72 with repeat of 158/82.  Of encouraged patient to consistently take her blood pressure medications and will give her a refill today. -Continue amlodipine 10 mg daily -Continue chlorthalidone 50 mg daily -Continue lisinopril 20 mg daily  Mixed urinary incontinence with urge predominant: She has a history of urinary incontinence that spontaneously resolved in the past but however in the last month she reports that she has had 4 episodes of urinary incontinence.  She reports of an urge to urinate and would have an accident by the time she reaches the bathroom.  She has sometimes resolved wearing pads.  She also describes episodes where she would leak urine when she laughs or sneezes. -PT referral for Keagle exercises -Advised patient to avoid caffeine -I will obtain urinalysis to look for glucosuria or proteinuria  Past Medical History:  Diagnosis Date  . High cholesterol   . 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 Hypertension   . Obesity   . Sickle cell trait ([place])   . Stroke ([place])   . Urinary incontinence    For years. Resolved 5/19 spontaneously   Review of Systems: As per HPI  Physical Exam:  Vitals:   09/23/17 1106 09/23/17 1149  BP: (!) 155/72 (!) 158/72  Pulse: 68 67  Temp: 99.2 F (37.3 C)   TempSrc: Oral   SpO2: 100%   Weight: 273 lb 8 oz (124.1 kg)   Height: 5\' 4"  (1.626 m)    Constitutional: In no acute distress, ambulates with a front wheel walker Cardiovascular:  Regular rate and rhythm, no murmurs gallops or rubs Respiratory: Clear to auscultation bilaterally Abdomen: Abdominal distention secondary to body habitus otherwise normal bowel sounds, nontender to palpation.  Assessment & Plan:   See Encounters Tab for problem based charting.  Patient discussed with Dr. Daryll Drown

## 2017-09-23 NOTE — Assessment & Plan Note (Signed)
Mixed urinary incontinence with urge predominant: She has a history of urinary incontinence that spontaneously resolved in the past but however in the last month she reports that she has had 4 episodes of urinary incontinence.  She reports of an urge to urinate and would have an accident by the time she reaches the bathroom.  She has sometimes resolved wearing pads.  She also describes episodes where she would leak urine when she laughs or sneezes. -PT referral for Keagle exercises -Advised patient to avoid caffeine -I will obtain urinalysis to look for glucosuria or proteinuria

## 2017-09-23 NOTE — Patient Instructions (Signed)
Belinda Ramirez,  It was a pleasure taking care of you here at the clinic.  Your blood pressure was still elevated but I am giving you refills of your blood pressure medications.  Please consistently take your blood pressure medications and I would follow-up in a month  For the leaking of urine, you have something that we called mixed urinary incontinence.  I am referring you to physical therapy and you should receive a call to schedule an appointment.  I am also going to get a urine sample to check for glucose.  ~Dr. Eileen Stanford

## 2017-09-23 NOTE — Assessment & Plan Note (Signed)
Hypertension:  Reports at home blood pressure readings has been within normal limits.  BP checked 2 days ago was 128/78.  BP initially elevated at today's clinic at 155/72 with repeat of 158/82.  Of encouraged patient to consistently take her blood pressure medications and will give her a refill today. -Continue amlodipine 10 mg daily -Continue chlorthalidone 50 mg daily -Continue lisinopril 20 mg daily

## 2017-09-24 LAB — URINALYSIS, COMPLETE
BILIRUBIN UA: NEGATIVE
Glucose, UA: NEGATIVE
Ketones, UA: NEGATIVE
Nitrite, UA: NEGATIVE
PH UA: 5.5 (ref 5.0–7.5)
Protein, UA: NEGATIVE
RBC UA: NEGATIVE
Specific Gravity, UA: 1.011 (ref 1.005–1.030)
UUROB: 0.2 mg/dL (ref 0.2–1.0)

## 2017-09-24 LAB — MICROSCOPIC EXAMINATION: Casts: NONE SEEN /lpf

## 2017-09-26 ENCOUNTER — Ambulatory Visit (INDEPENDENT_AMBULATORY_CARE_PROVIDER_SITE_OTHER): Payer: Self-pay | Admitting: Internal Medicine

## 2017-09-26 ENCOUNTER — Other Ambulatory Visit: Payer: Self-pay

## 2017-09-26 DIAGNOSIS — R09A2 Foreign body sensation, throat: Secondary | ICD-10-CM | POA: Insufficient documentation

## 2017-09-26 DIAGNOSIS — I129 Hypertensive chronic kidney disease with stage 1 through stage 4 chronic kidney disease, or unspecified chronic kidney disease: Secondary | ICD-10-CM

## 2017-09-26 DIAGNOSIS — M7989 Other specified soft tissue disorders: Secondary | ICD-10-CM

## 2017-09-26 DIAGNOSIS — G4733 Obstructive sleep apnea (adult) (pediatric): Secondary | ICD-10-CM

## 2017-09-26 DIAGNOSIS — E785 Hyperlipidemia, unspecified: Secondary | ICD-10-CM

## 2017-09-26 DIAGNOSIS — F458 Other somatoform disorders: Secondary | ICD-10-CM

## 2017-09-26 DIAGNOSIS — N183 Chronic kidney disease, stage 3 (moderate): Secondary | ICD-10-CM

## 2017-09-26 DIAGNOSIS — M25562 Pain in left knee: Secondary | ICD-10-CM

## 2017-09-26 DIAGNOSIS — Z8669 Personal history of other diseases of the nervous system and sense organs: Secondary | ICD-10-CM

## 2017-09-26 DIAGNOSIS — R0989 Other specified symptoms and signs involving the circulatory and respiratory systems: Secondary | ICD-10-CM | POA: Insufficient documentation

## 2017-09-26 NOTE — Progress Notes (Signed)
Internal Medicine Clinic Attending  I saw and evaluated the patient.  I personally confirmed the key portions of the history and exam documented by Dr. Agyei and I reviewed pertinent patient test results.  The assessment, diagnosis, and plan were formulated together and I agree with the documentation in the resident's note.  

## 2017-09-26 NOTE — Patient Instructions (Addendum)
It was a pleasure to see you today Belinda Ramirez. I am sorry to hear about your left knee pain and your difficulty swallowing. Please use supportive measures of aspirin and ice on your knee.   If you have any questions or concerns, please call our clinic at [phone] between 9am-5pm and after hours call [phone] and ask for the internal medicine resident on call. If you feel you are having a medical emergency please call 911.   Thank you, we look forward to help you remain healthy!  Lars Mage, MD Internal Medicine PGY2

## 2017-09-26 NOTE — Assessment & Plan Note (Addendum)
The patient states that she woke up this morning with discomfort when swallowing solid food.  She states that she does not have any difficulty with swallowing water.  She describes a sensation as her swallowing her tongue and something being stuck in the back of her throat.  She has not had any changes in her voice and she is not a smoker.  She does not have any pain in her throat and there is no pain when swallowing.  Assessment and plan Acute nature of the patient's symptoms and without any accompanying pain with swallowing or significant risk factors her symptoms are likely due to globus sensation.

## 2017-09-26 NOTE — Addendum Note (Signed)
Addended by: Gilles Chiquito B on: 09/26/2017 07:19 AM   Modules accepted: Level of Service

## 2017-09-26 NOTE — Progress Notes (Signed)
   CC: left leg swelling  HPI:  [name] is a 42 y.o. with hypertension, OSA, PRES, CKD III, hyperlipidemia who presents with left leg swelling. Please see problem based charting for evaluation, assessment, and plan.  Past Medical History:  Diagnosis Date  . High cholesterol   . 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 Hypertension   . Obesity   . Sickle cell trait ([place])   . Stroke ([place])   . Urinary incontinence    For years. Resolved 5/19 spontaneously   Review of Systems:    Denies sob, heartburn, postnasal drip, sneezign, cough, dizziness  Physical Exam:  Vitals:   09/26/17 1412  BP: 130/62  Pulse: 67  Temp: 98.7 F (37.1 C)  TempSrc: Oral  SpO2: 100%  Weight: 274 lb 8 oz (124.5 kg)  Height: 5\' 4"  (1.626 m)   Physical Exam  Constitutional: She appears well-developed and well-nourished. No distress.  HENT:  Head: Normocephalic and atraumatic.  Eyes: Conjunctivae are normal.  Cardiovascular: Normal rate, regular rhythm and normal heart sounds.  Respiratory: Effort normal and breath sounds normal. No respiratory distress. She has no wheezes.  GI: Soft. Bowel sounds are normal.  Musculoskeletal: She exhibits tenderness (prepatellar tenderness to palpation). She exhibits no edema.  No warmth to touch or effusions noted  Neurological: She is alert.  Skin: She is not diaphoretic. No erythema.  Psychiatric: She has a normal mood and affect. Her behavior is normal. Judgment and thought content normal.    Assessment & Plan:   See Encounters Tab for problem based charting.  Left leg swelling The patient presents with 1 day history of left knee pain and swelling.  Patient describes her leg swelling is present to the mid left calf.  She states that the pain is  worse whenever she puts weight on it and when she squeezes her calf.  She has not had any prior injuries to her left leg.  She describes the left knee pain as 8/10 intensity and constantly present.  Assessment and plan Examination of the patient's left knee does not show any effusion or swelling.  The patient does not have any warmth or significant tenderness to palpation of her left knee.  The patient's symptoms are likely due to osteoarthritic changes.  She is at risk of osteoarthritis due to her BMI of 47.  We will not do any imaging studies at this time due to the acute nature of her symptoms and intensity of pain..  Recommended supportive measures such as ice, NSAIDs.  Discomfort with swallowing The patient states that she woke up this morning with discomfort when swallowing solid food.  She states that she does not have any difficulty with swallowing water.  She describes a sensation as her swallowing her tongue and something being stuck in the back of her throat.  She has not had any changes in her voice and she is not a smoker.  She does not have any pain in her throat and there is no pain when swallowing.  Assessment and plan Acute nature of the patient's symptoms and without any accompanying pain with swallowing or significant risk factors her symptoms are likely due to globus sensation.  Patient discussed with Dr. Evette Doffing

## 2017-09-26 NOTE — Assessment & Plan Note (Signed)
The patient presents with 1 day history of left knee pain and swelling.  Patient describes her leg swelling is present to the mid left calf.  She states that the pain is worse whenever she puts weight on it and when she squeezes her calf.  She has not had any prior injuries to her left leg.  She describes the left knee pain as 8/10 intensity and constantly present.  Assessment and plan Examination of the patient's left knee does not show any effusion or swelling.  The patient does not have any warmth or significant tenderness to palpation of her left knee.  The patient's symptoms are likely due to osteoarthritic changes.  She is at risk of osteoarthritis due to her BMI of 47.  We will not do any imaging studies at this time due to the acute nature of her symptoms and intensity of pain..  Recommended supportive measures such as ice, NSAIDs.

## 2017-09-27 NOTE — Progress Notes (Signed)
Internal Medicine Clinic Attending  I saw and evaluated the patient.  I personally confirmed the key portions of the history and exam documented by Dr. Chundi and I reviewed pertinent patient test results.  The assessment, diagnosis, and plan were formulated together and I agree with the documentation in the resident's note. 

## 2017-10-02 NOTE — Congregational Nurse Program (Signed)
BP check. Client states that she has been having urinary incontinence issues. CN gave pads. CN recommended making sure to empty bladder every hour rather than waiting to go. Follow up in clinic as needed.

## 2017-10-03 ENCOUNTER — Ambulatory Visit: Payer: Self-pay

## 2017-10-05 NOTE — Addendum Note (Signed)
Addended by: Hulan Fray on: 10/05/2017 06:56 PM   Modules accepted: Orders

## 2017-10-10 DIAGNOSIS — Z0271 Encounter for disability determination: Secondary | ICD-10-CM

## 2017-10-11 ENCOUNTER — Encounter: Payer: Self-pay | Admitting: Pediatric Intensive Care

## 2017-10-12 ENCOUNTER — Other Ambulatory Visit: Payer: Self-pay

## 2017-10-12 ENCOUNTER — Ambulatory Visit (INDEPENDENT_AMBULATORY_CARE_PROVIDER_SITE_OTHER): Payer: Self-pay | Admitting: Internal Medicine

## 2017-10-12 ENCOUNTER — Encounter: Payer: Self-pay | Admitting: Internal Medicine

## 2017-10-12 VITALS — BP 129/55 | HR 80 | Temp 98.7°F | Ht 64.0 in | Wt 274.5 lb

## 2017-10-12 DIAGNOSIS — R400 Somnolence: Secondary | ICD-10-CM

## 2017-10-12 DIAGNOSIS — G4733 Obstructive sleep apnea (adult) (pediatric): Secondary | ICD-10-CM

## 2017-10-12 DIAGNOSIS — Z8679 Personal history of other diseases of the circulatory system: Secondary | ICD-10-CM

## 2017-10-12 NOTE — Assessment & Plan Note (Signed)
Per chart review, Belinda Ramirez was given a presumed diagnosis of obstructive sleep apnea in March 2017 when she was hospitalized for hypertensive emergency and right basal ganglia hemorrhage though she has not had an official polysomnography.  She was readmitted in February 2018 for HTN emergency and was noted to have transient hypoxia and bradycardia.  She was given a trial of CPAP at that time and was advised to undergo a sleep study which has since not been done.  Today, she presents with 2-week history of increased somnolence.  She usually goes to bed at 9:30 PM and wake up at 5:30 AM and reports of getting a decent night sleep.  She reports of a long-standing history of snoring from childhood and reports that her mom told her she had apneic spells as a child.  Recently, she has been feeling more sleepy but denies morning headaches.  She will usually fall asleep at church, at clinic waiting area and at her shelter randomly.  She does not seem to have cataplexy or sleep paralysis and denies anhedonia, feeling of depression, hopelessness, suicidal ideation or homicidal ideation.  She reports of having relief from the trial of CPAP in 2018 when she was hospitalized.  It seems her ongoing symptoms of increased somnolence is most likely secondary to obstructive sleep apnea and less likely narcolepsy.    Per chart review, she has had laboratory findings of iron deficiency anemia as indicated with low ferritin, increased RDW and low MCV.  In addition, vitamin B12 in 2017 was on the lower limit of normal.  These hematologic findings also be contributing to her ongoing symptoms.  Plan: - Referral for split-night sleep study - Obtain B12, ferritin, CBC, TSH - She will benefit from weight loss and diet control.

## 2017-10-12 NOTE — Progress Notes (Signed)
   CC: Sleepiness   HPI:  BelindaBelinda Ramirez is a 42 y.o. with medical history significant for obstructive sleep apnea presenting for evaluation of increased sleepiness.  See problem based assessment below for further details.    Sleepiness: Per chart review, Belinda Ramirez was given a presumed diagnosis of obstructive sleep apnea in March 2017 when she was hospitalized for hypertensive emergency and right basal ganglia hemorrhage though she has not had an official polysomnography.  She was readmitted for HTN emergency in February 2018 was noted to have transient hypoxia and bradycardia.  She was given a trial of CPAP at that time and was advised to undergo a sleep study which has since not been done.  Today, she presents with 2-week history of increased somnolence.  She usually goes to bed at 9:30 PM and wake up at 5:30 AM and reports of getting a decent night sleep.  She reports of a long-standing history of snoring from childhood and reports that her mom told her she had apneic spells as a child.  Recently, she has been feeling more sleepy but denies morning headaches.  She will usually fall asleep at church, at clinic waiting area and at her shelter randomly.  She does not seem to have cataplexy or sleep paralysis and denies anhedonia, feeling of depression, hopelessness, suicidal ideation or homicidal ideation.  She reports of having relief from the trial of CPAP in 2018 when she was hospitalized.  It seems her ongoing symptoms of increased somnolence is most likely secondary to obstructive sleep apnea and less likely narcolepsy.    Per chart review, she has had laboratory findings of iron deficiency anemia as indicated with low ferritin, increased RDW and low MCV.  In addition, vitamin B12 in 2017 was on the lower limit of normal.  These hematologic findings also be contributing to her ongoing symptoms.  Plan: - Referral for split-night sleep study - Obtain B12, ferritin, CBC, TSH - She will  benefit from weight loss and diet control.   Past Medical History:  Diagnosis Date  . High cholesterol   . 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 Hypertension   . Obesity   . Sickle cell trait ([place])   . Stroke ([place])   . Urinary incontinence    For years. Resolved 5/19 spontaneously   Review of Systems:  AS per HPI  Physical Exam:  Vitals:   10/12/17 1504  BP: (!) 129/55  Pulse: 80  Temp: 98.7 F (37.1 C)  TempSrc: Oral  SpO2: 97%  Weight: 274 lb 8 oz (124.5 kg)  Height: 5\' 4"  (1.626 m)   Physical Exam  Constitutional: She is well-developed, well-nourished, and in no distress.  HENT:  Head: Normocephalic and atraumatic.  Cardiovascular: Normal rate, regular rhythm and normal heart sounds. Exam reveals no gallop and no friction rub.  No murmur heard. Pulmonary/Chest: Effort normal and breath sounds normal. No respiratory distress. She has no wheezes. She has no rales.  Abdominal: Bowel sounds are normal. She exhibits no distension. There is no tenderness.  Neurological: She is alert.  Psychiatric: Mood and affect normal.    Assessment & Plan:   See Encounters Tab for problem based charting.  Patient discussed with Dr. Lynnae January

## 2017-10-12 NOTE — Patient Instructions (Signed)
Ms. Belinda Ramirez,  It was a pleasure taking care of you at the clinic today and sorry to hear about your ongoing symptoms of feeling more sleepy.  My impression of her symptoms is that you probably have obstructive sleep apnea and as we discussed, here are my recommendations after today's visit:  1.  I have put in a referral for you to have a sleep study done 2.  I am ordering some labs today  I will have you follow-up at the clinic after you have these tests done.

## 2017-10-13 ENCOUNTER — Telehealth: Payer: Self-pay | Admitting: Internal Medicine

## 2017-10-13 DIAGNOSIS — D518 Other vitamin B12 deficiency anemias: Secondary | ICD-10-CM

## 2017-10-13 DIAGNOSIS — D509 Iron deficiency anemia, unspecified: Secondary | ICD-10-CM

## 2017-10-13 LAB — CBC
Hematocrit: 34.3 % (ref 34.0–46.6)
Hemoglobin: 10.9 g/dL — ABNORMAL LOW (ref 11.1–15.9)
MCH: 25.5 pg — ABNORMAL LOW (ref 26.6–33.0)
MCHC: 31.8 g/dL (ref 31.5–35.7)
MCV: 80 fL (ref 79–97)
Platelets: 351 10*3/uL (ref 150–450)
RBC: 4.27 x10E6/uL (ref 3.77–5.28)
RDW: 17.7 % — ABNORMAL HIGH (ref 12.3–15.4)
WBC: 7.1 10*3/uL (ref 3.4–10.8)

## 2017-10-13 LAB — FERRITIN: Ferritin: 13 ng/mL — ABNORMAL LOW (ref 15–150)

## 2017-10-13 LAB — VITAMIN B12: Vitamin B-12: 307 pg/mL (ref 232–1245)

## 2017-10-13 LAB — TSH: TSH: 1.83 u[IU]/mL (ref 0.450–4.500)

## 2017-10-13 MED ORDER — FERROUS SULFATE 325 (65 FE) MG PO TBEC: 325.0000 mg | DELAYED_RELEASE_TABLET | Freq: Two times a day (BID) | ORAL | 3 refills | Status: DC

## 2017-10-13 MED ORDER — CYANOCOBALAMIN 500 MCG PO TABS: 500.0000 ug | ORAL_TABLET | Freq: Every day | ORAL | 0 refills | Status: DC

## 2017-10-13 NOTE — Telephone Encounter (Signed)
I called the patient to discuss her lab results.  CBC is indicated for iron deficiency anemia.  I am giving her a prescription for ferrous sulfate 325 mg twice daily.  Also, her B12 is on the lower side and will benefit from an IM B12 injection.  This should be discussed with the patient at her next clinic appointment.

## 2017-10-13 NOTE — Telephone Encounter (Signed)
I called the patient to discuss her lab results.  CBC is indicated for iron deficiency anemia.  I am giving her a prescription for ferrous sulfate 325 mg twice daily.  Also, her B12 is on the lower side and will benefit from an IM B12 injection.  This should be discussed with the patient at her next clinic appointment. I have given a prescription for Vit b12 570mcg. Will follow up at the next clinic appointment

## 2017-10-14 ENCOUNTER — Other Ambulatory Visit: Payer: Self-pay | Admitting: Internal Medicine

## 2017-10-14 NOTE — Progress Notes (Signed)
Internal Medicine Clinic Attending  I saw and evaluated the patient.  I personally confirmed the key portions of the history and exam documented by Dr. Eileen Stanford and I reviewed pertinent patient test results.  The assessment, diagnosis, and plan were formulated together and I agree with the documentation in the resident's note. Her B 12 is low nl, similar to level 2 yrs ago. Would rec MMA or following before tx.

## 2017-10-24 NOTE — Congregational Nurse Program (Signed)
Client states leg swelling over last few days. Right greater than left. Denies calf pain. CN advised PCP visit for evaluation.

## 2017-10-25 NOTE — Congregational Nurse Program (Signed)
Bp check 

## 2017-11-14 ENCOUNTER — Encounter: Payer: Self-pay | Admitting: Internal Medicine

## 2017-11-22 ENCOUNTER — Telehealth: Payer: Self-pay

## 2017-11-22 ENCOUNTER — Institutional Professional Consult (permissible substitution): Payer: MEDICAID | Admitting: Neurology

## 2017-11-22 NOTE — Telephone Encounter (Signed)
Please review and follow protocol as per our No Show Policy.

## 2017-11-22 NOTE — Telephone Encounter (Signed)
Pt did not show for their appt with Dr. Rexene Alberts today.  This is pt's second new patient no show.

## 2017-11-23 ENCOUNTER — Encounter: Payer: Self-pay | Admitting: Neurology

## 2017-11-24 ENCOUNTER — Encounter: Payer: Self-pay | Admitting: Neurology

## 2017-12-02 MED FILL — CHLORTHALIDONE 50 MG TABS: 50 | 30 days supply | Qty: 30 | Fill #1

## 2017-12-02 MED FILL — LISINOPRIL 20 MG TAB: 20 | 30 days supply | Qty: 30 | Fill #1

## 2018-01-05 ENCOUNTER — Ambulatory Visit: Payer: Self-pay | Admitting: Internal Medicine

## 2018-01-24 ENCOUNTER — Ambulatory Visit: Payer: Self-pay | Admitting: Nurse Practitioner

## 2018-02-04 ENCOUNTER — Observation Stay (HOSPITAL_COMMUNITY)
Admission: EM | Admit: 2018-02-04 | Discharge: 2018-02-07 | Disposition: A | Discharge status: Home / Self Care | Payer: Medicaid Other | Attending: Internal Medicine | Admitting: Internal Medicine

## 2018-02-04 ENCOUNTER — Emergency Department (HOSPITAL_COMMUNITY): Payer: Medicaid Other

## 2018-02-04 ENCOUNTER — Encounter (HOSPITAL_COMMUNITY): Payer: Self-pay | Admitting: *Deleted

## 2018-02-04 DIAGNOSIS — E785 Hyperlipidemia, unspecified: Secondary | ICD-10-CM | POA: Insufficient documentation

## 2018-02-04 DIAGNOSIS — Z8673 Personal history of transient ischemic attack (TIA), and cerebral infarction without residual deficits: Secondary | ICD-10-CM

## 2018-02-04 DIAGNOSIS — R42 Dizziness and giddiness: Secondary | ICD-10-CM | POA: Diagnosis present

## 2018-02-04 DIAGNOSIS — I6783 Posterior reversible encephalopathy syndrome: Secondary | ICD-10-CM | POA: Insufficient documentation

## 2018-02-04 DIAGNOSIS — I129 Hypertensive chronic kidney disease with stage 1 through stage 4 chronic kidney disease, or unspecified chronic kidney disease: Secondary | ICD-10-CM | POA: Insufficient documentation

## 2018-02-04 DIAGNOSIS — N183 Chronic kidney disease, stage 3 unspecified: Secondary | ICD-10-CM | POA: Diagnosis present

## 2018-02-04 DIAGNOSIS — D573 Sickle-cell trait: Secondary | ICD-10-CM | POA: Insufficient documentation

## 2018-02-04 DIAGNOSIS — R51 Headache: Secondary | ICD-10-CM | POA: Diagnosis present

## 2018-02-04 DIAGNOSIS — Z886 Allergy status to analgesic agent status: Secondary | ICD-10-CM

## 2018-02-04 DIAGNOSIS — E7800 Pure hypercholesterolemia, unspecified: Secondary | ICD-10-CM | POA: Insufficient documentation

## 2018-02-04 DIAGNOSIS — Z91040 Latex allergy status: Secondary | ICD-10-CM

## 2018-02-04 DIAGNOSIS — Z885 Allergy status to narcotic agent status: Secondary | ICD-10-CM

## 2018-02-04 DIAGNOSIS — Z881 Allergy status to other antibiotic agents status: Secondary | ICD-10-CM

## 2018-02-04 DIAGNOSIS — Z79899 Other long term (current) drug therapy: Secondary | ICD-10-CM | POA: Insufficient documentation

## 2018-02-04 DIAGNOSIS — I10 Essential (primary) hypertension: Secondary | ICD-10-CM | POA: Diagnosis present

## 2018-02-04 DIAGNOSIS — I160 Hypertensive urgency: Principal | ICD-10-CM | POA: Insufficient documentation

## 2018-02-04 DIAGNOSIS — R110 Nausea: Secondary | ICD-10-CM | POA: Diagnosis present

## 2018-02-04 DIAGNOSIS — G43909 Migraine, unspecified, not intractable, without status migrainosus: Secondary | ICD-10-CM | POA: Insufficient documentation

## 2018-02-04 DIAGNOSIS — I69154 Hemiplegia and hemiparesis following nontraumatic intracerebral hemorrhage affecting left non-dominant side: Secondary | ICD-10-CM

## 2018-02-04 DIAGNOSIS — Z8669 Personal history of other diseases of the nervous system and sense organs: Secondary | ICD-10-CM

## 2018-02-04 LAB — CBC WITH DIFFERENTIAL/PLATELET
Abs Immature Granulocytes: 0.02 10*3/uL (ref 0.00–0.07)
Basophils Absolute: 0 10*3/uL (ref 0.0–0.1)
Basophils Relative: 0 %
Eosinophils Absolute: 0.1 10*3/uL (ref 0.0–0.5)
Eosinophils Relative: 1 %
HEMATOCRIT: 40.2 % (ref 36.0–46.0)
HEMOGLOBIN: 12.6 g/dL (ref 12.0–15.0)
Immature Granulocytes: 0 %
LYMPHS PCT: 26 %
Lymphs Abs: 2.2 10*3/uL (ref 0.7–4.0)
MCH: 24.2 pg — ABNORMAL LOW (ref 26.0–34.0)
MCHC: 31.3 g/dL (ref 30.0–36.0)
MCV: 77.3 fL — ABNORMAL LOW (ref 80.0–100.0)
MONOS PCT: 6 %
Monocytes Absolute: 0.5 10*3/uL (ref 0.1–1.0)
Neutro Abs: 5.9 10*3/uL (ref 1.7–7.7)
Neutrophils Relative %: 67 %
Platelets: 356 10*3/uL (ref 150–400)
RBC: 5.2 MIL/uL — ABNORMAL HIGH (ref 3.87–5.11)
RDW: 15.7 % — ABNORMAL HIGH (ref 11.5–15.5)
WBC: 8.7 10*3/uL (ref 4.0–10.5)
nRBC: 0 % (ref 0.0–0.2)

## 2018-02-04 LAB — POCT I-STAT, CHEM 8
BUN: 13 mg/dL (ref 6–20)
CREATININE: 1.5 mg/dL — AB (ref 0.44–1.00)
Calcium, Ion: 1.08 mmol/L — ABNORMAL LOW (ref 1.15–1.40)
Chloride: 105 mmol/L (ref 98–111)
GLUCOSE: 94 mg/dL (ref 70–99)
HCT: 40 % (ref 36.0–46.0)
Hemoglobin: 13.6 g/dL (ref 12.0–15.0)
Potassium: 4.3 mmol/L (ref 3.5–5.1)
Sodium: 141 mmol/L (ref 135–145)
TCO2: 32 mmol/L (ref 22–32)

## 2018-02-04 LAB — POCT I-STAT TROPONIN I: Troponin i, poc: 0.02 ng/mL (ref 0.00–0.08)

## 2018-02-04 MED ORDER — AMLODIPINE BESYLATE 10 MG PO TABS
10.0000 mg | ORAL_TABLET | Freq: Every day | ORAL | Status: DC
  Administered 2018-02-05 – 2018-02-07 (×4): 10 mg via ORAL
  Filled 2018-02-04 (×3): qty 1
  Filled 2018-02-04: qty 2
  Filled 2018-02-04: qty 1

## 2018-02-04 MED ORDER — LABETALOL HCL 5 MG/ML IV SOLN
10.0000 mg | Freq: Once | INTRAVENOUS | Status: AC
  Administered 2018-02-04: 10 mg via INTRAVENOUS
  Filled 2018-02-04: qty 4

## 2018-02-04 MED ORDER — LISINOPRIL 20 MG PO TABS
20.0000 mg | ORAL_TABLET | Freq: Every day | ORAL | Status: DC
  Administered 2018-02-05 – 2018-02-07 (×3): 20 mg via ORAL
  Filled 2018-02-04 (×3): qty 1

## 2018-02-04 MED ORDER — CHLORTHALIDONE 50 MG PO TABS
50.0000 mg | ORAL_TABLET | Freq: Every day | ORAL | Status: DC
  Administered 2018-02-05 – 2018-02-07 (×3): 50 mg via ORAL
  Filled 2018-02-04 (×3): qty 1

## 2018-02-04 MED ORDER — ATORVASTATIN CALCIUM 80 MG PO TABS
80.0000 mg | ORAL_TABLET | Freq: Every day | ORAL | Status: DC
  Administered 2018-02-06: 80 mg via ORAL
  Filled 2018-02-04: qty 1

## 2018-02-04 MED ORDER — ACETAMINOPHEN 500 MG PO TABS
1000.0000 mg | ORAL_TABLET | Freq: Once | ORAL | Status: AC
  Administered 2018-02-04: 1000 mg via ORAL
  Filled 2018-02-04: qty 2

## 2018-02-04 NOTE — ED Provider Notes (Signed)
[facility] DEPT Provider Note   CSN: [id number] Arrival date & time: 02/04/18  1635     History   Chief Complaint Chief Complaint  Patient presents with  . Headache  . Chest Pain    HPI Belinda Ramirez is a 42 y.o. female.  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  She presents with a headache and left arm weakness.  She states that since yesterday she has had intermittent sharp pains in the posterior right side of her head.  They have been coming and going all day.  She also has intermittent vision loss in her right eye.  She states it is normal now but does come and go.  She feels like her left arm is weaker than baseline.  She has some baseline mild weakness in the left arm and left leg from her prior stroke but she feels like her left arm weakness is worse today.  She does not have any increased weakness in her leg.  No numbness in her extremities.  No speech deficits.  She states that she has been compliant with her antihypertensive medications.  She denies any recent trauma.  No associated neck pain.  No fevers.  She has some photophobia but no vomiting.  She has been admitted for similar symptoms with PRESS syndrome other than she had more of a frontal headache that time.  She did have some fleeting episodes of central chest pain throughout today.  She currently denies any.  She states that last just a couple seconds when it comes.     Past Medical History:  Diagnosis Date  . High cholesterol   . 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 Hypertension   . Obesity   . Sickle cell trait ([place])   . Stroke ([place])   . Urinary  incontinence    For years. Resolved 5/19 spontaneously    Patient Active Problem List   Diagnosis Date Noted  . Globus pharyngeus 09/26/2017  . Left leg swelling 09/26/2017  . Blurred vision 08/17/2017  . History of CVA (cerebrovascular accident) 08/17/2017  . Urinary incontinence   . Severe hypertension 07/21/2017  . PRES (posterior reversible encephalopathy syndrome) 07/11/2017  . CKD (chronic kidney disease), stage III ([place]) 01/22/2017  . OSA (obstructive sleep apnea) 06/19/2015  . Morbid obesity due to excess calories ([place])   . Hyperlipidemia   . History of cerebral hemorrhage 04/16/2015    Past Surgical History:  Procedure Laterality Date  . TUBAL LIGATION       OB History   No obstetric history on file.      Home Medications    Prior to Admission medications   Medication Sig Start Date End Date Taking? Authorizing Provider  acetaminophen (TYLENOL) 500 MG tablet Take 2 tablets (1,000 mg total) by mouth every 6 (six) hours as needed. 07/19/17   McDonald, Mia A, PA-C  amLODipine (NORVASC) 10 MG tablet Take 1 tablet (10 mg total) by mouth daily. IM Program. 09/23/17   Jean Rosenthal, MD  atorvastatin (LIPITOR) 80 MG tablet Take 1 tablet (80 mg total) by mouth daily at 6 PM. 08/17/17   Molt, Bethany, DO  chlorthalidone (HYGROTON) 50 MG  tablet Take 1 tablet (50 mg total) by mouth daily. IM Program 09/23/17   Jean Rosenthal, MD  ferrous sulfate 325 (65 FE) MG EC tablet Take 1 tablet (325 mg total) by mouth 2 (two) times daily. 10/13/17 02/10/18  Jean Rosenthal, MD  lisinopril (PRINIVIL,ZESTRIL) 20 MG tablet Take 1 tablet (20 mg total) by mouth daily. 09/23/17 09/23/18  Jean Rosenthal, MD  Multiple Vitamins-Minerals (ADULT GUMMY) CHEW Chew 1 tablet by mouth at bedtime.     [provider]    Family History Family History  Problem Relation Age of Onset  . Heart attack Maternal Grandfather   . Diabetes Mother   . Hypertension Mother   . Cancer Father   . Heart disease Maternal  Grandmother     Social History Social History   Tobacco Use  . Smoking status: Never Smoker  . Smokeless tobacco: Never Used  Substance Use Topics  . Alcohol use: Yes    Alcohol/week: 0.0 standard drinks    Comment: occaisonal  . Drug use: No     Allergies   Oxycodone; Percocet [oxycodone-acetaminophen]; Amoxicillin; Aspirin; and Latex   Review of Systems Review of Systems  Constitutional: Negative for chills, diaphoresis, fatigue and fever.  HENT: Negative for congestion, rhinorrhea and sneezing.   Eyes: Positive for photophobia and visual disturbance.  Respiratory: Negative for cough, chest tightness and shortness of breath.   Cardiovascular: Positive for chest pain. Negative for leg swelling.  Gastrointestinal: Positive for nausea. Negative for abdominal pain, blood in stool, diarrhea and vomiting.  Genitourinary: Negative for difficulty urinating, flank pain, frequency and hematuria.  Musculoskeletal: Negative for arthralgias and back pain.  Skin: Negative for rash.  Neurological: Positive for numbness and headaches. Negative for dizziness, speech difficulty and weakness.     Physical Exam Updated Vital Signs BP (!) 185/107   Pulse 77   Temp 98.8 F (37.1 C) (Oral)   Resp 18   SpO2 98%   Physical Exam Constitutional:      Appearance: She is well-developed.  HENT:     Head: Normocephalic and atraumatic.  Eyes:     Extraocular Movements: Extraocular movements intact.     Pupils: Pupils are equal, round, and reactive to light.  Neck:     Musculoskeletal: Normal range of motion and neck supple.  Cardiovascular:     Rate and Rhythm: Normal rate and regular rhythm.     Heart sounds: Normal heart sounds.  Pulmonary:     Effort: Pulmonary effort is normal. No respiratory distress.     Breath sounds: Normal breath sounds. No wheezing or rales.  Chest:     Chest wall: No tenderness.  Abdominal:     General: Bowel sounds are normal.     Palpations: Abdomen is  soft.     Tenderness: There is no abdominal tenderness. There is no guarding or rebound.  Musculoskeletal: Normal range of motion.  Lymphadenopathy:     Cervical: No cervical adenopathy.  Skin:    General: Skin is warm and dry.     Findings: No rash.  Neurological:     Mental Status: She is alert and oriented to person, place, and time.     Comments: Motor 5 out of 5 right upper and lower extremities, 4 out of 5 in the left upper and lower extremities with some mild increased weakness in the left arm.  Sensation grossly intact to light touch all extremities, cranial nerves II through XII grossly intact, visual fields full  to confrontation.  Finger-nose intact.      ED Treatments / Results  Labs (all labs ordered are listed, but only abnormal results are displayed) Labs Reviewed  CBC WITH DIFFERENTIAL/PLATELET - Abnormal; Notable for the following components:      Result Value   RBC 5.20 (*)    MCV 77.3 (*)    MCH 24.2 (*)    RDW 15.7 (*)    All other components within normal limits  POCT I-STAT, CHEM 8 - Abnormal; Notable for the following components:   Creatinine, Ser 1.50 (*)    Calcium, Ion 1.08 (*)    All other components within normal limits  I-STAT CHEM 8, ED  I-STAT TROPONIN, ED  POCT I-STAT TROPONIN I    EKG EKG Interpretation  Date/Time:  Saturday February 04 2018 16:58:42 EST Ventricular Rate:  78 PR Interval:    QRS Duration: 112 QT Interval:  438 QTC Calculation: 499 R Axis:   41 Text Interpretation:  Sinus rhythm Borderline prolonged PR interval Probable left atrial enlargement Borderline intraventricular conduction delay Abnormal T, consider ischemia, lateral leads ST elevation, consider inferior injury similar to prior EKGs Confirmed by Malvin Johns [phone] on 02/04/2018 5:59:17 PM   Radiology Ct Head Wo Contrast  Result Date: 02/04/2018 CLINICAL DATA:  Pain in back of head since last night, chest pain, history hypertension, stroke, prior cerebral  hemorrhage EXAM: CT HEAD WITHOUT CONTRAST TECHNIQUE: Contiguous axial images were obtained from the base of the skull through the vertex without intravenous contrast. Sagittal and coronal MPR images reconstructed from axial data set. COMPARISON:  08/17/2017 FINDINGS: Brain: Normal ventricular morphology. No midline shift or mass effect. Small vessel chronic ischemic changes of deep cerebral white matter. Small old infarct posterior LEFT periventricular. No intracranial hemorrhage, mass lesion, or evidence of acute infarction. No extra-axial fluid collections. Vascular: Unremarkable Skull: Normal appearance Sinuses/Orbits: Clear Other: N/A IMPRESSION: Small vessel chronic ischemic changes of deep cerebral white matter. Old posterior LEFT parietal periventricular white matter infarct. No acute intracranial abnormalities. Electronically Signed   By: Lavonia Dana M.D.   On: 02/04/2018 17:34    Procedures Procedures (including critical care time)  Medications Ordered in ED Medications  labetalol (NORMODYNE,TRANDATE) injection 10 mg (10 mg Intravenous Given 02/04/18 1828)  acetaminophen (TYLENOL) tablet 1,000 mg (1,000 mg Oral Given 02/04/18 1828)     Initial Impression / Assessment and Plan / ED Course  I have reviewed the triage vital signs and the nursing notes.  Pertinent labs & imaging results that were available during my care of the patient were reviewed by me and considered in my medical decision making (see chart for details).     Patient is a 42 year old female with a history of hypertension, prior intracranial hemorrhage and PRESS syndrome who presents with marked hypertension, posterior headache, vision changes and increase in her left arm weakness.  I gave her a small dose of labetalol and her blood pressure improved to 185/107.  I do not want to lower it anymore until she gets the MRI.  I did discuss with Dr. Rory Percy with neurology who is on board with the patient getting an MRI.   Unfortunately, we do not have MRI capability here after 5 today.  I have discussed this with Dr. Vanita Panda at [facility] who has accepted the patient for transfer to the [facility] ED for MRI.  Dr. Rory Percy is available all night and knows about the patient if he needs to be consulted following the MRI.  Final Clinical Impressions(s) / ED Diagnoses   Final diagnoses:  Hypertension, unspecified type    ED Discharge Orders    None       Malvin Johns, MD 02/04/18 [phone]

## 2018-02-04 NOTE — ED Provider Notes (Signed)
Patient seen on arrival.  ABCs intact on arrival.  No focal neurological deficits on arrival.  Patient given 1 dose of IV labetalol on arrival for systolics greater than 223.  MR studies already ordered.   Hulan Saas, MD 02/04/18 2051    Carmin Muskrat, MD 02/05/18 [phone]

## 2018-02-04 NOTE — ED Triage Notes (Signed)
Pt complains of pain in the back of her head since last night. Pt also complains of chest pain since this morning.

## 2018-02-04 NOTE — H&P (Addendum)
Date: 02/05/2018               Patient Name:  Belinda Ramirez MRN: [id number]  DOB: [birth year] Age / Sex: 42 y.o., female   PCP: Delice Bison, DO         Medical Service: Internal Medicine Teaching Service         Attending Physician: Dr. Sid Falcon, MD    First Contact: Dr. Truman Hayward Pager: [phone]  Second Contact: Dr. Maricela Bo Pager: [phone]       After Hours (After 5p/  First Contact Pager: [phone]  weekends / holidays): Second Contact Pager: [phone]   Chief Complaint: headache  History of Present Illness: Belinda Ramirez is a 42 yo female with a medical history of HTN, hemorrhagic CVA in 2017 thought to be 2/2 to HTN with residual mild left-sided weakness, PRES, CKDIII, and HLD who presented to the ED with headache, vision changes, chest pain, and right hand weakness that started earlier today. She reports that for the past few days she has felt foggy with memory changes, but hasn't had any pain. She takes her BP at home and when she last took it two days ago it was in the 272Z systolic. This morning, shortly after waking, she began to experience sharp pain on the right side of the back of her head. The pain is associated with blurry vision in the right eye and a pressure-type, non-radiating pain in her mid chest. She also feels nauseous and dyspneic when the head pain occurs. These symptoms mostly occur when she is exerting herself and improve when she rests. She has been experiencing the pain about 4 times per hour. She endorses lightheadedness, but denies difficulty speaking and leg swelling. She currently feels better, but still has a slight right-sided headache. She denies any recent salty meals. She tries to avoid sodium. She takes chlorthalidone 50mg  and lisinopril 20mg  daily at home for her blood pressure. Of note, at her last clinic appointment in August 2019, she was noted to be on amlodipine 10mg  as well. At that time her BP was well-controlled at 129/55. However, she  does not remember ever being on this medication. She reports compliance with her medications and took them already today. She has never had a sleep study before, but this was ordered recently to evaluate for OSA.  She was last hospitalized in 06/2017 with hypertension and symptoms of left-sided paresthesias, blurry vision, headaches, and slurred speech. Brain imaging at this time was negative for acute intracranial bleed, but showed white matter changes concerning for PRES. During this hospitalization, her blood pressure was gradually lowered and her symptoms resolved.   On arrival to the ED, she was hypertensive to 231/113. Vitals were otherwise normal and stable. Labs significant for Cr of 1.5 (consistent with baseline) and negative troponin. EKG unchanged from prior. CT head showed NAICA, old posterior left parietal periventricular white matter infarct, and small vessel chronic ischemic changes of the deep cerebral white matter. MR brain showed a new, but nonacute pontine lacunar infarct compared to prior studies as well as old right basal ganglia and bilateral thalami infarcts and chronic microhemorrhages. In the ED she received 1g tylenol and labetalol 10mg  IV x2. Her BP decreased to the 366Y systolic.   Meds:  Current Meds  Medication Sig  . chlorthalidone (HYGROTON) 50 MG tablet Take 1 tablet (50 mg total) by mouth daily. IM Program  . ferrous sulfate 325 (65 FE) MG EC tablet Take 1 tablet (  325 mg total) by mouth 2 (two) times daily.  Marland Kitchen lisinopril (PRINIVIL,ZESTRIL) 20 MG tablet Take 1 tablet (20 mg total) by mouth daily.  . vitamin B-12 (CYANOCOBALAMIN) 1000 MCG tablet Take 1,000 mcg by mouth daily.   Allergies: Allergies as of 02/04/2018 - Review Complete 02/04/2018  Allergen Reaction Noted  . Oxycodone Hives 05/01/2013  . Percocet [oxycodone-acetaminophen] Hives 07/16/2013  . Amoxicillin Hives 07/11/2017  . Aspirin Other (See Comments) 05/26/2016  . Latex Itching 07/16/2013   Past  Medical History:  Diagnosis Date  . High cholesterol   . 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 Hypertension   . Obesity   . Sickle cell trait ([place])   . Stroke ([place])   . Urinary incontinence    For years. Resolved 5/19 spontaneously   Past Surgical History:  Procedure Laterality Date  . TUBAL LIGATION      Family History: Mother has HTN. No family history of stroke or kidney disease.  Social History: Previously unhomed, but currently lives alone in an apartment in [place]. Is getting married soon. Walks with a cane or walker due to left-sided weakness. Previously worked for the [employer], but she has not worked since her stroke. She reports that she is able to afford her medications because they are on the $4 list. Never smoker. Denies etoh or illicit drug use.  Review of Systems: A complete ROS was negative except as per HPI.   Physical Exam: Blood pressure (!) 178/83, pulse 72, temperature 98.6 F (37 C), temperature source Oral, resp. rate 18, SpO2 98 %.  Constitutional: Lying comfortably in bed. Obese. No distress. Neuro: Alert and oriented x3. Cranial nerves II-XII intact. Vision testing is the same in both eyes, but patient reports blurry vision in the R. Normal strength and sensation throughout. No dysmetria on cerebellar testing. Cardiovascular: Normal rate and regular rhythm. No murmurs, rubs, or gallops. Pulmonary/Chest: Effort normal. Clear to auscultation bilaterally. No wheezes, rales, or rhonchi. Abdominal: Bowel sounds present. Soft, non-distended, non-tender. Ext: No lower extremity edema. Skin: Warm and dry. No rashes or wounds.  EKG: personally reviewed my interpretation is NSR, T wave inversions of leads I and aVL (unchanged from prior),  atrial hypertrophy  Assessment & Plan by Problem: Active Problems:   Severe hypertension  Symptomatic Hypertension Belinda Ramirez is a 42 yo female with a medical history of HTN, hemorrhagic CVA with residual mild left-sided weakness, PRES, CKDIII, and HLD who presented to the ED with acute onset of right-sided occipital headache, right eye blurriness, chest pain, and right hand weakness. She was hypertensive to 231/113 on arrival, but her BP decreased to the 967R systolic after labetalol 10mg  IV x2. Brain MR negative for acute CVA. EKG without new ischemic changes and troponin negative.  - Blood pressures have been normal at home per patient  - Patient compliant with lisinopril and chlorthalidone, although she was unaware that she should also be taking amlodipine. Patient also compliant with low sodium diet. Unclear what sparked her acute and severe elevation in blood pressure from one day to the next.  - Low concern for PRES at this time given brain MR and normal mentation Plan - Tele - Continue home chlorthalidone 50mg  daily and lisinopril 20mg  daily - Start amlodipine 10mg  daily  - If patient's  symptoms return, consider additional IV therapy for HTN with hydralazine - am CBC, BMP - UA to evaluate for proteinuria  CKDIII  - Baseline Cr 1.5 - Kidney function appears stable at this time  FEN: no IV fluids, heart healthy diet, replace electrolytes as needed  DVT ppx: [place] Lovenox Code status: FULL code  Dispo: Admit patient to Observation with expected length of stay less than 2 midnights.  Signed: Corinne Ports, MD 02/05/2018, 12:45 AM  Pager: [phone]

## 2018-02-05 ENCOUNTER — Encounter (HOSPITAL_COMMUNITY): Payer: Self-pay | Admitting: *Deleted

## 2018-02-05 ENCOUNTER — Other Ambulatory Visit: Payer: Self-pay

## 2018-02-05 DIAGNOSIS — I10 Essential (primary) hypertension: Secondary | ICD-10-CM

## 2018-02-05 LAB — BASIC METABOLIC PANEL
ANION GAP: 11 (ref 5–15)
BUN: 9 mg/dL (ref 6–20)
CO2: 25 mmol/L (ref 22–32)
Calcium: 8.7 mg/dL — ABNORMAL LOW (ref 8.9–10.3)
Chloride: 104 mmol/L (ref 98–111)
Creatinine, Ser: 1.66 mg/dL — ABNORMAL HIGH (ref 0.44–1.00)
GFR calc Af Amer: 43 mL/min — ABNORMAL LOW (ref >60)
GFR calc non Af Amer: 37 mL/min — ABNORMAL LOW (ref >60)
GLUCOSE: 90 mg/dL (ref 70–99)
Potassium: 3.4 mmol/L — ABNORMAL LOW (ref 3.5–5.1)
Sodium: 140 mmol/L (ref 135–145)

## 2018-02-05 LAB — URINALYSIS, COMPLETE (UACMP) WITH MICROSCOPIC
BILIRUBIN URINE: NEGATIVE
Glucose, UA: NEGATIVE mg/dL
Ketones, ur: NEGATIVE mg/dL
Nitrite: NEGATIVE
Protein, ur: 100 mg/dL — AB
Specific Gravity, Urine: 1.02 (ref 1.005–1.030)
pH: 6 (ref 5.0–8.0)

## 2018-02-05 LAB — LIPID PANEL
Cholesterol: 218 mg/dL — ABNORMAL HIGH (ref 0–200)
HDL: 79 mg/dL (ref >40)
LDL Cholesterol: 129 mg/dL — ABNORMAL HIGH (ref 0–99)
Total CHOL/HDL Ratio: 2.8 RATIO
Triglycerides: 48 mg/dL (ref <150)
VLDL: 10 mg/dL (ref 0–40)

## 2018-02-05 LAB — RAPID URINE DRUG SCREEN, HOSP PERFORMED
Amphetamines: NOT DETECTED
Barbiturates: NOT DETECTED
Benzodiazepines: NOT DETECTED
COCAINE: NOT DETECTED
Opiates: NOT DETECTED
TETRAHYDROCANNABINOL: NOT DETECTED

## 2018-02-05 LAB — CBC
HCT: 38.1 % (ref 36.0–46.0)
Hemoglobin: 12 g/dL (ref 12.0–15.0)
MCH: 24.1 pg — ABNORMAL LOW (ref 26.0–34.0)
MCHC: 31.5 g/dL (ref 30.0–36.0)
MCV: 76.7 fL — ABNORMAL LOW (ref 80.0–100.0)
PLATELETS: 314 10*3/uL (ref 150–400)
RBC: 4.97 MIL/uL (ref 3.87–5.11)
RDW: 15.7 % — ABNORMAL HIGH (ref 11.5–15.5)
WBC: 8.6 10*3/uL (ref 4.0–10.5)
nRBC: 0 % (ref 0.0–0.2)

## 2018-02-05 LAB — MRSA PCR SCREENING: MRSA by PCR: NEGATIVE

## 2018-02-05 MED ORDER — ACETAMINOPHEN 325 MG PO TABS
650.0000 mg | ORAL_TABLET | Freq: Four times a day (QID) | ORAL | Status: DC | PRN
  Administered 2018-02-05 – 2018-02-06 (×4): 650 mg via ORAL
  Filled 2018-02-05 (×4): qty 2

## 2018-02-05 MED ORDER — SUMATRIPTAN SUCCINATE 50 MG PO TABS
50.0000 mg | ORAL_TABLET | Freq: Once | ORAL | Status: AC
  Administered 2018-02-05: 50 mg via ORAL
  Filled 2018-02-05: qty 1

## 2018-02-05 MED ORDER — HYDRALAZINE HCL 20 MG/ML IJ SOLN
5.0000 mg | INTRAMUSCULAR | Status: DC | PRN
  Administered 2018-02-05 – 2018-02-07 (×2): 5 mg via INTRAVENOUS
  Filled 2018-02-05 (×2): qty 1

## 2018-02-05 MED ORDER — ENOXAPARIN SODIUM 40 MG/0.4ML ~~LOC~~ SOLN
40.0000 mg | Freq: Every day | SUBCUTANEOUS | Status: DC
  Administered 2018-02-05 – 2018-02-07 (×3): 40 mg via SUBCUTANEOUS
  Filled 2018-02-05 (×3): qty 0.4

## 2018-02-05 NOTE — Progress Notes (Signed)
   Subjective:   Belinda Ramirez was seen resting in her bed eating breakfast this morning. She mentioned that she had severe headaches that were worsened with bright light and sound.   Objective:  Vital signs in last 24 hours: Vitals:   02/05/18 0300 02/05/18 0400 02/05/18 0800 02/05/18 0849  BP: (!) 207/111 (!) 177/133 (!) 121/98 (!) 171/96  Pulse: 67 66 74   Resp: 15 15 15    Temp: 98.2 F (36.8 C)  98.4 F (36.9 C)   TempSrc: Oral  Oral   SpO2: 96% 99% 99%   Weight: 124.8 kg     Height: 5' 4.5" (1.638 m)      Physical Exam  Constitutional: Appears well-developed and well-nourished. No distress.  HENT:  Head: Normocephalic and atraumatic.  Eyes: Conjunctivae are normal.  Cardiovascular: Normal rate, regular rhythm and normal heart sounds.  Respiratory: Effort normal and breath sounds normal. No respiratory distress. No wheezes.  GI: Soft. Bowel sounds are normal. No distension. There is no tenderness.  Musculoskeletal: No edema.  Neurological: Is alert.  Skin: Not diaphoretic. No erythema.  Psychiatric: Normal mood and affect. Behavior is normal. Judgment and thought content normal.   Assessment/Plan:  Belinda Ramirez is a 42 y.o f with htn, pres, ckd III, hld who presented with headache, vision changes, and right hand weakness. CT head and mri brain did not show any changes consistent with CVA or pres.   Hypertensive Urgency The patient presented with bp of 231/113 and is now with bp 170s/90s. No end organ damage.   -Chlorthalidone 50 mg daily -IV hydralazine 5 mg every 4 hours as needed -Amlodipine 10 mg daily -Lisinopril to 20 mg daily  Headaches The patient describes the headaches as being present for the last few days.  She says the headaches are worse with bright light and noise.  Likely due to high blood pressure and possible migraine.  -Sumatriptan 50 mg given -Tylenol 650 every 6 hours as needed -Made patient's room dark  Hyperlipidemia  -Continue atorvastatin  80 mg daily  CKD III Patient's cr=1.5 today which is at baseline.   Dispo: Anticipated discharge in approximately 1 day(s).   Lars Mage, MD 02/05/2018, 11:13 AM Pager: [phone]

## 2018-02-06 DIAGNOSIS — I69354 Hemiplegia and hemiparesis following cerebral infarction affecting left non-dominant side: Secondary | ICD-10-CM

## 2018-02-06 DIAGNOSIS — I160 Hypertensive urgency: Secondary | ICD-10-CM

## 2018-02-06 DIAGNOSIS — R51 Headache: Secondary | ICD-10-CM

## 2018-02-06 LAB — BASIC METABOLIC PANEL
Anion gap: 12 (ref 5–15)
BUN: 13 mg/dL (ref 6–20)
CHLORIDE: 103 mmol/L (ref 98–111)
CO2: 24 mmol/L (ref 22–32)
Calcium: 8.7 mg/dL — ABNORMAL LOW (ref 8.9–10.3)
Creatinine, Ser: 2.08 mg/dL — ABNORMAL HIGH (ref 0.44–1.00)
GFR calc Af Amer: 33 mL/min — ABNORMAL LOW (ref >60)
GFR calc non Af Amer: 28 mL/min — ABNORMAL LOW (ref >60)
Glucose, Bld: 103 mg/dL — ABNORMAL HIGH (ref 70–99)
Potassium: 3.5 mmol/L (ref 3.5–5.1)
Sodium: 139 mmol/L (ref 135–145)

## 2018-02-06 LAB — TROPONIN I: Troponin I: 0.03 ng/mL (ref <0.03)

## 2018-02-06 MED ORDER — DIPHENHYDRAMINE HCL 50 MG/ML IJ SOLN: 25.0000 mg | Freq: Once | INTRAMUSCULAR | Status: DC

## 2018-02-06 MED ORDER — SUMATRIPTAN SUCCINATE 50 MG PO TABS
50.0000 mg | ORAL_TABLET | Freq: Once | ORAL | Status: AC
  Administered 2018-02-06: 50 mg via ORAL
  Filled 2018-02-06: qty 1

## 2018-02-06 MED ORDER — DIPHENHYDRAMINE HCL 50 MG/ML IJ SOLN
12.5000 mg | Freq: Once | INTRAMUSCULAR | Status: AC
  Administered 2018-02-06: 12.5 mg via INTRAVENOUS
  Filled 2018-02-06: qty 1

## 2018-02-06 MED ORDER — PROCHLORPERAZINE EDISYLATE 10 MG/2ML IJ SOLN
5.0000 mg | Freq: Once | INTRAMUSCULAR | Status: AC
  Administered 2018-02-06: 5 mg via INTRAVENOUS
  Filled 2018-02-06: qty 1

## 2018-02-06 NOTE — Discharge Summary (Addendum)
Name: Belinda Ramirez MRN: [id number] DOB: 01/31/1976 42 y.o. PCP: Belinda Bison, DO  Date of Admission: 02/04/2018  5:44 PM Date of Discharge: 02/07/2018 Attending Physician: Belinda Kilts, MD   Discharge Diagnosis: 1. Hypertensive Urgency 2. Migraines 3. Hyperlipidemia  Discharge Medications: Allergies as of 02/07/2018      Reactions   Oxycodone Hives   Percocet [oxycodone-acetaminophen] Hives   Amoxicillin Hives   Has patient had a PCN reaction causing immediate rash, facial/tongue/throat swelling, SOB or lightheadedness with hypotension: No Has patient had a PCN reaction causing severe rash involving mucus membranes or skin necrosis: No Has patient had a PCN reaction that required hospitalization: No Has patient had a PCN reaction occurring within the last 10 years: Yes If all of the above answers are "NO", then may proceed with Cephalosporin use.   Aspirin Other (See Comments)   Hx of hemorrhagic stroke (pt does take low dose aspirin )   Latex Itching      Medication List    TAKE these medications   acetaminophen 500 MG tablet Commonly known as:  TYLENOL Take 2 tablets (1,000 mg total) by mouth every 6 (six) hours as needed.   amLODipine 10 MG tablet Commonly known as:  NORVASC Take 1 tablet (10 mg total) by mouth daily. Start taking on:  February 08, 2018 What changed:  additional instructions   atorvastatin 80 MG tablet Commonly known as:  LIPITOR Take 1 tablet (80 mg total) by mouth daily at 6 PM.   chlorthalidone 50 MG tablet Commonly known as:  HYGROTON Take 1 tablet (50 mg total) by mouth daily. IM Program   ferrous sulfate 325 (65 FE) MG EC tablet Take 1 tablet (325 mg total) by mouth 2 (two) times daily.   lisinopril 20 MG tablet Commonly known as:  PRINIVIL,ZESTRIL Take 1 tablet (20 mg total) by mouth daily.   SUMAtriptan 100 MG tablet Commonly known as:  IMITREX Take 0.5 tablets (50 mg total) by mouth daily as needed for  migraine. May repeat in 2 hours if headache persists or recurs.   vitamin B-12 1000 MCG tablet Commonly known as:  CYANOCOBALAMIN Take 1,000 mcg by mouth daily.       Disposition and follow-up:   [name] was discharged from [facility] in Stable condition.  At the hospital follow up visit please address:  1.  Hypertension- patient on lisinopril and chlorthalidone but was not taking amlodipine; assess compliance to these medications and recheck BP  2.  Migraines- patient started having posterior right sided headaches with right orbital pain and photophobia on admission, responded well to sumatriptan, see if patient is still having migraines and/or responding well to this medication  3.  Hyperlipidemia-make sure patient is continuing to take atorvastatin 80 mg daily  4.  Labs / imaging needed at time of follow-up: none  5.  Pending labs/ test needing follow-up: none  Follow-up Appointments:  Patient to follow with [facility] center next week.   Hospital Course by problem list: 1. Hypertensive Urgency-presented to the ED with headache, vision changes, chest pain and right hand weakness.  He was hypertensive to 231/113.  CT head showed a NAICA, old posterior left parietal periventricular white matter infarct, and small vessel chronic ischemic changes of deep cerebral white matter.  MR brain showed a new but acute pontine lacunar infarct compared to prior studies as well as old right basal ganglia bilateral thalami infarcts and chronic microhemorrhages.  Been IV labetalol 10 mg  x 2 and her BP decreased to 301 systolic.  Had been taking home medications chlorthalidone 50 mg and lisinopril 20 mg daily.  At her last clinic appointment in August 2019 she was noted to be on amlodipine pain 10 mg however patient does not remember being prescribed this medication.  She was restarted on amlodipine 10 mg along with her home medications and responded well.  She was discharged with  chlorthalidone 50 mg daily, lisinopril 20 mg daily and amlodipine 10 mg daily. 2. Migraines-patient had new onset headaches during this admission in the posterior right side and right orbital pain.  She said the headaches were worse with light and noise.  Please secondary to her high blood pressure and possible migraines.  She was treated with sumatriptan 50 mg and a migraine cocktail responded well.  Discharged with sumatriptan. 3. Hyperlipidemia- lipid panel on 12/22 showed cholesterol of 218 and LDL of 129.  She reported that she is not taking his atorvastatin.  Restarted atorvastatin 80 mg daily.  Discharge Vitals:   BP 129/89   Pulse 87   Temp 98.5 F (36.9 C) (Oral)   Resp (!) 21   Ht 5' 4.5" (1.638 m)   Wt 124.8 kg   SpO2 94%   BMI 46.50 kg/m   Pertinent Labs, Studies, and Procedures:   Today's Vitals   02/07/18 0800 02/07/18 0821 02/07/18 0918 02/07/18 1200  BP:  (!) 168/96 (!) 149/86 129/89  Pulse:  89 87   Resp:  (!) 23 (!) 21   Temp:      TempSrc:      SpO2:  95% 94%   Weight:      Height:      PainSc: 0-No pain      Body mass index is 46.5 kg/m. Lipid Panel     Component Value Date/Time   CHOL 218 (H) 02/05/2018 0339   TRIG 48 02/05/2018 0339   HDL 79 02/05/2018 0339   CHOLHDL 2.8 02/05/2018 0339   VLDL 10 02/05/2018 0339   LDLCALC 129 (H) 02/05/2018 0339   Ct Head Wo Contrast  Result Date: 02/04/2018 CLINICAL DATA:  Pain in back of head since last night, chest pain, history hypertension, stroke, prior cerebral hemorrhage EXAM: CT HEAD WITHOUT CONTRAST TECHNIQUE: Contiguous axial images were obtained from the base of the skull through the vertex without intravenous contrast. Sagittal and coronal MPR images reconstructed from axial data set. COMPARISON:  08/17/2017 FINDINGS: Brain: Normal ventricular morphology. No midline shift or mass effect. Small vessel chronic ischemic changes of deep cerebral white matter. Small old infarct posterior LEFT periventricular.  No intracranial hemorrhage, mass lesion, or evidence of acute infarction. No extra-axial fluid collections. Vascular: Unremarkable Skull: Normal appearance Sinuses/Orbits: Clear Other: N/A IMPRESSION: Small vessel chronic ischemic changes of deep cerebral white matter. Old posterior LEFT parietal periventricular white matter infarct. No acute intracranial abnormalities. Electronically Signed   By: Lavonia Dana M.D.   On: 02/04/2018 17:34   Mr Brain Wo Contrast  Result Date: 02/04/2018 CLINICAL DATA:  Posterior headache, vision changes and LEFT arm weakness. History of stroke, hypertension, hyperlipidemia. EXAM: MRI HEAD WITHOUT CONTRAST TECHNIQUE: Multiplanar, multiecho pulse sequences of the brain and surrounding structures were obtained without intravenous contrast. COMPARISON:  CT HEAD February 04, 2018 and MRI head Jul 11, 2017. FINDINGS: INTRACRANIAL CONTENTS: No reduced diffusion to suggest acute ischemia or hyperacute demyelination. Far > expected scattered chronic microhemorrhages, relatively similar in distribution to prior MRI. Old RIGHT basal ganglia and LEFT thalamus  infarcts with susceptibility artifact. Old RIGHT thalamus infarct. Ex vacuo dilatation subjacent ventricles with mild parenchymal brain volume loss. No hydrocephalus. Patchy supratentorial white matter FLAIR T2 hyperintensities. New nonacute small pontine infarcts. No suspicious parenchymal signal, masses, mass effect. No abnormal extra-axial fluid collections. No extra-axial masses. VASCULAR: Normal major intracranial vascular flow voids present at skull base. SKULL AND UPPER CERVICAL SPINE: No abnormal sellar expansion. No suspicious calvarial bone marrow signal. Generally decreased calvarial bone marrow signal seen with bone marrow reactivation. Craniocervical junction maintained. SINUSES/ORBITS: The mastoid air-cells and included paranasal sinuses are well-aerated.The included ocular globes and orbital contents are non-suspicious.  OTHER: None. IMPRESSION: 1. No acute intracranial process. 2. New nonacute pontine lacunar infarct superimposed on mild-to-moderate chronic small vessel ischemic changes. 3. Old RIGHT basal ganglia and bilateral thalami infarcts. Numerous similar chronic microhemorrhages seen with chronic hypertension. 4. Similar advanced parenchymal brain volume loss for age. Electronically Signed   By: Elon Alas M.D.   On: 02/04/2018 22:13    Discharge Instructions: Discharge Instructions    Diet - low sodium heart healthy   Complete by:  As directed    Discharge instructions   Complete by:  As directed    Ms. Herbison,  You were hospitalized due to severe hypertension. I want you to continue taking lisinopril 20 mg and chlorthalidone 50 mg and also START taking amlodipine 10 mg daily.  I have also sent in a prescription for Sumatriptan for your migraines. Take half a tablet, 50 mg daily as needed for migraines. May repeat another 50 mg dose in 2 hours if headache persists or recurs.  The [facility] clinic will be in touch with you to schedule a hospital follow up appointment sometime next week.  Thanks for allowing [name] to be a part of your care! Happy Holidays!   Increase activity slowly   Complete by:  As directed       Signed: Mike Craze, DO 02/07/2018, 2:46 PM   Pager: [phone]   Internal Medicine Attending Note:  I saw and examined the patient on the day of discharge. I reviewed and agree with the discharge summary written by the house staff.  Lenice Pressman, M.D., Ph.D.

## 2018-02-06 NOTE — Plan of Care (Signed)
  Problem: Health Behavior/Discharge Planning: Goal: Ability to manage health-related needs will improve Outcome: Progressing   Problem: Clinical Measurements: Goal: Ability to maintain clinical measurements within normal limits will improve Outcome: Progressing Goal: Diagnostic test results will improve Outcome: Progressing Goal: Cardiovascular complication will be avoided Outcome: Progressing   Problem: Activity: Goal: Risk for activity intolerance will decrease Outcome: Progressing   Problem: Coping: Goal: Level of anxiety will decrease Outcome: Progressing   Problem: Elimination: Goal: Will not experience complications related to bowel motility Outcome: Progressing Goal: Will not experience complications related to urinary retention Outcome: Progressing   Problem: Pain Managment: Goal: General experience of comfort will improve Outcome: Progressing   Problem: Safety: Goal: Ability to remain free from injury will improve Outcome: Progressing   Problem: Skin Integrity: Goal: Risk for impaired skin integrity will decrease Outcome: Progressing

## 2018-02-06 NOTE — Progress Notes (Addendum)
Patient sitting on side of bed. Orthostatic b/ps checked on patient. Lying- Patient tolerated standing up at bedside well. However much encouragement needed to walk. Ambulated to bathroom via help with husband. Continues to c/o headache and dizziness with headache being worse when standing and walking. Patient walked to door and says her head is spending and she feels too dizzy to go any further. Patient turned around and at bedside chair given self a bath with husband assistance. Will let MD know about patient's abilities. Patient says that she does not feel ready to go home. Another dose of imitrex and tylenol given for migraine at this time. Will continue to observe.   2130- Paged internal medicine Awaiting call back  0125- Patient up ambulating In room to bathroom. States that her head feels much better. Was given imitrex and tylenol together earlier in the shift. Will continue to observe. Patient had a steady gait coming from the bathroom. Denies headache and says the dizziness is not that bad now.

## 2018-02-06 NOTE — Progress Notes (Addendum)
   Subjective: Belinda Ramirez was seen resting comfortably in bed this morning. She was complaining of a headache that got worse when we turned the lights on. She reported the pain was on her posterior right side of her head and radiated to behind her right eye.  She denied any nausea, vomiting, changes in vision.  She felt this was a migraine.  She also felt that she was weaker on her left side.  She has residual left-sided weakness from her prior stroke however stated that she is now having more difficulty gripping things.  Objective:  Vital signs in last 24 hours: Vitals:   02/05/18 1121 02/05/18 1500 02/05/18 1946 02/06/18 0714  BP: (!) 152/68 121/62 (!) 143/62 (!) 156/76  Pulse: 78 69 73 69  Resp: (!) 25 (!) 22 14 16   Temp: 98.8 F (37.1 C) 98.2 F (36.8 C) 98.5 F (36.9 C) 98.3 F (36.8 C)  TempSrc: Oral Oral Oral Oral  SpO2: 94% 93% 100% 97%  Weight:      Height:       Physical Exam  Constitutional: She is oriented to person, place, and time and well-developed, well-nourished, and in no distress.  Cardiovascular: Normal rate, regular rhythm and normal heart sounds.  No murmur heard. Pulmonary/Chest: Effort normal and breath sounds normal. No respiratory distress. She has no wheezes.  Abdominal: Soft. Bowel sounds are normal. She exhibits no distension. There is no abdominal tenderness.  Musculoskeletal:        General: No edema.  Neurological: She is alert and oriented to person, place, and time. No cranial nerve deficit.  Left sided weakness in UE and LE   Skin: Skin is warm and dry.  Psychiatric: Mood, memory, affect and judgment normal.    Assessment/Plan:  Active Problems:   CKD (chronic kidney disease), stage III (HCC)   Severe hypertension   History of CVA (cerebrovascular accident)  Belinda Ramirez is a 42 y.o f with htn, pres, ckd III, hld who presented with headache, vision changes, and right hand weakness. CT head and mri brain did not show any changes consistent  with CVA or pres.   Hypertensive Urgency BP 115/101 today. She has only needed one dose of IV hydralazine. Plan to continue amlodipine at discharge along with chlorthalidone and lisinopril. -Chlorthalidone 50 mg daily -IV hydralazine 5 mg every 4 hours as needed -Amlodipine 10 mg daily -Lisinopril to 20 mg daily  Headaches Patient is continuing to have a headache today. She says the headaches are worse with bright light and noise, located on the posterior right side radiating to behind her right eye.  Stated she felt more weak on her left side.  Neuro exam was unremarkable. Likely due to high blood pressure and possible migraine. Will make sure patient's headache improves and ambulate the patient and make sure she is able to tolerate.  -ordered a migraine cocktail- benadryl 12.5 mg IV, compazine 5 mg IV  -Tylenol 650 every 6 hours as needed - Made patient's room dark  Hyperlipidemia -Continue atorvastatin 80 mg daily   Dispo: Anticipated discharge is today.  Mike Craze, DO 02/06/2018, 10:39 AM Pager: [phone]

## 2018-02-07 DIAGNOSIS — G43909 Migraine, unspecified, not intractable, without status migrainosus: Secondary | ICD-10-CM

## 2018-02-07 DIAGNOSIS — Z8673 Personal history of transient ischemic attack (TIA), and cerebral infarction without residual deficits: Secondary | ICD-10-CM

## 2018-02-07 MED ORDER — SUMATRIPTAN SUCCINATE 100 MG PO TABS: 50.0000 mg | ORAL_TABLET | Freq: Every day | ORAL | 0 refills | Status: DC | PRN

## 2018-02-07 MED ORDER — AMLODIPINE BESYLATE 10 MG PO TABS: 10.0000 mg | ORAL_TABLET | Freq: Every day | ORAL | 0 refills | Status: DC

## 2018-02-07 NOTE — Progress Notes (Signed)
   Subjective: Patient reports her headache feels better this morning. She isn't sure if the sumatriptan that she received yesterday helped her headache. She has no new symptoms or concerns today.  Objective:  Vital signs in last 24 hours: Vitals:   02/06/18 2010 02/06/18 2015 02/07/18 0013 02/07/18 0451  BP: (!) 143/80 (!) 153/88 (!) 149/74 (!) 149/83  Pulse:   70 76  Resp: 19 (!) 22 (!) 27 (!) 21  Temp:   99 F (37.2 C) 99.2 F (37.3 C)  TempSrc:   Oral Oral  SpO2:   90% 93%  Weight:      Height:       Physical Exam  Constitutional: She is oriented to person, place, and time and well-developed, well-nourished, and in no distress.  Cardiovascular: Normal rate, regular rhythm and normal heart sounds.  No murmur heard. Pulmonary/Chest: Effort normal and breath sounds normal. No respiratory distress. She has no wheezes.  Abdominal: Soft. Bowel sounds are normal. She exhibits no distension. There is no abdominal tenderness.  Musculoskeletal:        General: No edema.  Neurological: She is alert and oriented to person, place, and time.  Skin: Skin is warm and dry.  Psychiatric: Mood, memory, affect and judgment normal.    Assessment/Plan:  Active Problems:   CKD (chronic kidney disease), stage III (HCC)   Severe hypertension   History of CVA (cerebrovascular accident)   Ms. Gavitt is a 42 y.o f with htn, pres, ckd III, hld who presented with headache, vision changes, and right hand weakness. CT head and mri brain did not show any changes consistent with CVA or pres.   Hypertensive Urgency BP 149/83 today. Plan to continue amlodipine at discharge along with chlorthalidone and lisinopril. -Chlorthalidone 50 mg daily -IV hydralazine 5 mg every 4 hours as needed -Amlodipine 10 mg daily -Lisinopril to 20 mg daily  Headaches Plan to discharge with Sumatriptan   Hyperlipidemia -Continue atorvastatin 80 mg daily  Dispo: Patient is stable for discharge today.  Mike Craze, DO 02/07/2018, 6:32 AM Pager: [phone]

## 2018-02-10 ENCOUNTER — Telehealth: Payer: Self-pay

## 2018-02-10 NOTE — Telephone Encounter (Signed)
error 

## 2018-02-20 MED FILL — AMLODIPINE BESYLATE 10 MG T: 10 | 30 days supply | Qty: 30 | Fill #0

## 2018-02-20 MED FILL — SUMAtriptan SUCCINATE 100 M: 100 | 30 days supply | Qty: 9 | Fill #0

## 2018-02-24 ENCOUNTER — Encounter: Payer: Self-pay | Admitting: Nurse Practitioner

## 2018-02-24 ENCOUNTER — Ambulatory Visit: Payer: Self-pay | Attending: Nurse Practitioner | Admitting: Licensed Clinical Social Worker

## 2018-02-24 ENCOUNTER — Ambulatory Visit: Payer: Medicaid Other | Attending: Nurse Practitioner | Admitting: Nurse Practitioner

## 2018-02-24 VITALS — BP 147/84 | HR 76 | Temp 98.1°F | Ht 64.0 in | Wt 284.2 lb

## 2018-02-24 DIAGNOSIS — I129 Hypertensive chronic kidney disease with stage 1 through stage 4 chronic kidney disease, or unspecified chronic kidney disease: Secondary | ICD-10-CM | POA: Insufficient documentation

## 2018-02-24 DIAGNOSIS — E7800 Pure hypercholesterolemia, unspecified: Secondary | ICD-10-CM | POA: Diagnosis not present

## 2018-02-24 DIAGNOSIS — N3941 Urge incontinence: Secondary | ICD-10-CM | POA: Insufficient documentation

## 2018-02-24 DIAGNOSIS — I10 Essential (primary) hypertension: Secondary | ICD-10-CM | POA: Diagnosis not present

## 2018-02-24 DIAGNOSIS — Z885 Allergy status to narcotic agent status: Secondary | ICD-10-CM | POA: Insufficient documentation

## 2018-02-24 DIAGNOSIS — Z79899 Other long term (current) drug therapy: Secondary | ICD-10-CM | POA: Diagnosis not present

## 2018-02-24 DIAGNOSIS — Z8249 Family history of ischemic heart disease and other diseases of the circulatory system: Secondary | ICD-10-CM | POA: Diagnosis not present

## 2018-02-24 DIAGNOSIS — Z809 Family history of malignant neoplasm, unspecified: Secondary | ICD-10-CM | POA: Insufficient documentation

## 2018-02-24 DIAGNOSIS — N183 Chronic kidney disease, stage 3 unspecified: Secondary | ICD-10-CM

## 2018-02-24 DIAGNOSIS — Z6841 Body Mass Index (BMI) 40.0 and over, adult: Secondary | ICD-10-CM | POA: Diagnosis not present

## 2018-02-24 DIAGNOSIS — I69354 Hemiplegia and hemiparesis following cerebral infarction affecting left non-dominant side: Secondary | ICD-10-CM | POA: Diagnosis not present

## 2018-02-24 DIAGNOSIS — Z91040 Latex allergy status: Secondary | ICD-10-CM | POA: Diagnosis not present

## 2018-02-24 DIAGNOSIS — D573 Sickle-cell trait: Secondary | ICD-10-CM | POA: Diagnosis not present

## 2018-02-24 DIAGNOSIS — E538 Deficiency of other specified B group vitamins: Secondary | ICD-10-CM | POA: Insufficient documentation

## 2018-02-24 DIAGNOSIS — Z833 Family history of diabetes mellitus: Secondary | ICD-10-CM | POA: Diagnosis not present

## 2018-02-24 DIAGNOSIS — R45851 Suicidal ideations: Secondary | ICD-10-CM

## 2018-02-24 DIAGNOSIS — Z880 Allergy status to penicillin: Secondary | ICD-10-CM | POA: Diagnosis not present

## 2018-02-24 DIAGNOSIS — F419 Anxiety disorder, unspecified: Secondary | ICD-10-CM

## 2018-02-24 DIAGNOSIS — F331 Major depressive disorder, recurrent, moderate: Secondary | ICD-10-CM

## 2018-02-24 DIAGNOSIS — Z886 Allergy status to analgesic agent status: Secondary | ICD-10-CM | POA: Insufficient documentation

## 2018-02-24 MED ORDER — FLUTICASONE PROPIONATE 50 MCG/ACT NA SUSP: 2.0000 | Freq: Every day | NASAL | 6 refills | Status: AC

## 2018-02-24 NOTE — Progress Notes (Signed)
Patient states that she may have a ear infection.

## 2018-02-24 NOTE — BH Specialist Note (Signed)
Integrated Behavioral Health Initial Visit  MRN: [id number] Name: Belinda Ramirez  Number of [place] Clinician visits:: 1/6 Session Start time: 2:45 PM  Session End time: 3:20 PM Total time: 35 minutes  Type of Service: [place] Interpretor:No.    Warm Hand Off Completed.       SUBJECTIVE: Belinda Ramirez is a 43 y.o. female accompanied by SELF Patient was referred by NP Archie Patten for suicidal ideations and depressive symptoms. Patient reports the following symptoms/concerns: Pt reports that she has suicidal ideations with no plan. She reports that she has been dealing with depression for approximately one year since initial stroke and losing house. Duration of problem: 1 year; Severity of problem: moderate  OBJECTIVE: Mood: Anxious and Depressed and Affect: Depressed and Tearful Risk of harm to self or others: Suicidal ideation  LIFE CONTEXT: Family and Social: Pt has no friends/family support in [place]. [place] supports pt with housing. Speaks to mother daily who resides in [place].  School/Work: Currently unemployed and unable to work due to health concerns. Applies for disability and is appealing with a Chief Executive Officer.  Self-Care: Denies substance use. Enjoys walking outside but mobility is limited. Life Changes: Pt had a stroke one year ago that caused her to lose both jobs and housing.   GOALS ADDRESSED: Patient will: 1. Reduce symptoms of: anxiety, depression, insomnia and stress 2. Increase knowledge and/or ability of: coping skills and stress reduction  3. Demonstrate ability to: Increase healthy adjustment to current life circumstances and Increase adequate support systems for patient/family  INTERVENTIONS: Interventions utilized: Motivational Interviewing, Veterinary surgeon, Supportive Counseling and Link to [employer]  Standardized Assessments completed: C-SSRS Short, GAD-7 and PHQ  2&9  ASSESSMENT: Patient currently experiencing depressive and anxiety symptoms with suicidal ideations. Pt does not have plan or intent. Pt has low riskPt attended first counseling session on 02/23/2018 with family services and has another appt the following week. Pt acknowledges her depression and is taking the proper precautions. Pt has difficulty sleeping. MSW Intern encouraged pt to try mindful meditation with [employer] app.   Pt had stroke one year ago that caused her to lose job and housing. Pt has had about 3-4 strokes in the past year. [facility] while repeatedly being admitted to hospital for health concerns. Pt is currently enrolled in a program with [facility] where she has an apartment. Pt receives food stamps in the amount $187.    MSW intern provided pt with food resources and offered supportive services while attending counseling with family services.  PLAN: 1. Follow up with behavioral health clinician on : MSW intern encouraged pt to schedule appt if needed. 2. Behavioral recommendations: continue to attend counseling and try mindful meditation 3. Referral(s): [place] (In Clinic) and [employer] Resources:  Food 4. "From scale of 1-10, how likely are you to follow plan?"  Ruffin Pyo, MSW Intern 02/24/2017, 5:35PM

## 2018-02-24 NOTE — Progress Notes (Signed)
Assessment & Plan:  Belinda Ramirez was seen today for new patient (initial visit).  Diagnoses and all orders for this visit:  Essential hypertension Continue all antihypertensives as prescribed.  Remember to bring in your blood pressure log with you for your follow up appointment.  DASH/Mediterranean Diets are healthier choices for HTN.    Urge incontinence F/U at next office visit. Patient declines UA.  CKD (chronic kidney disease), stage III (HCC) -     CMP14+EGFR Baseline Cr+1.47 per notes. May need to dc chlorthalidone or decrease based on lab results.   B12 deficiency -     B12 and Folate Panel  Hemiplegia and hemiparesis following cerebral infarction affecting left non-dominant side (HCC) Stable.   Morbid obesity due to excess calories ([place]) Discussed diet and exercise for person with BMI >48. Instructed: You must burn more calories than you eat. Losing 5 percent of your body weight should be considered a success. In the longer term, losing more than 15 percent of your body weight and staying at this weight is an extremely good result. However, keep in mind that even losing 5 percent of your body weight leads to important health benefits, so try not to get discouraged if you're not able to lose more than this. Will recheck weight in 3-6 months.  Other orders -     fluticasone (FLONASE) 50 MCG/ACT nasal spray; Place 2 sprays into both nostrils daily.    Patient has been counseled on age-appropriate routine health concerns for screening and prevention. These are reviewed and up-to-date. Referrals have been placed accordingly. Immunizations are up-to-date or declined.    Subjective:   Chief Complaint  Patient presents with  . New Patient (Initial Visit)   HPI Belinda Ramirez 43 y.o. female presents to office today to establish care. She has a history of HTN, HPL, Morbid obesity, sickle cell trait and  hemorrhagic stroke (2017).  Based on her depression screening in the  office today she was referred to the onsite social worker.   ESSENTIAL HYPERTENSION She has HTN for which she takes Chlorthalidone 50m daily, Lisinopril 231mdaily and Amlodipine 1010maily. She currently denies chest pain, shortness of breath, palpitations, lightheadedness, dizziness, headaches or BLE edema. Taking all medications as prescribed. She does not monitor her blood pressure at home.  BP Readings from Last 3 Encounters:  02/24/18 (!) 147/84  02/07/18 129/89  10/12/17 (!) 129/55    Urge incontinence  Reports symptoms started after she was discharged from the hospital  For Accelerated HTN. She was not prescribed any new medications upon discharge aside from sumatriptan which she reports she stopped taking due to side effects of drowsiness. She leaks urine with with urge, with a full bladder. Patient describes the symptoms as  urge to urinate with little or no warning.  Factors associated with symptoms include none known. Evaluation to date includes none.Treatment to date includes none. We discussed weight reduction and bladder retraining. She currently denies dysuria, frequency, hesitancy. UA 02-05-2018 was negative for UTI but positive for trichomonas. She declines UA today. States she already went to the bathroom before she came to the office. Per office notes dated 08-17-2017 patient reported a previous history of persistent urinary incontinence which had resolved on its own.    Review of Systems  Constitutional: Negative for fever, malaise/fatigue and weight loss.  HENT: Negative.  Negative for nosebleeds.   Eyes: Negative.  Negative for blurred vision, double vision and photophobia.  Respiratory: Negative.  Negative for cough and  shortness of breath.   Cardiovascular: Negative.  Negative for chest pain, palpitations and leg swelling.  Gastrointestinal: Negative.  Negative for heartburn, nausea and vomiting.  Genitourinary:       SEE HPI  Musculoskeletal: Negative.  Negative for  myalgias.  Neurological: Positive for focal weakness. Negative for dizziness, seizures and headaches.  Psychiatric/Behavioral: Positive for depression. Negative for suicidal ideas.    Past Medical History:  Diagnosis Date  . High cholesterol   . 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 Hypertension   . Obesity   . Sickle cell trait ([place])   . Stroke ([place])   . Urinary incontinence    For years. Resolved 5/19 spontaneously    Past Surgical History:  Procedure Laterality Date  . TUBAL LIGATION      Family History  Problem Relation Age of Onset  . Heart attack Maternal Grandfather   . Diabetes Mother   . Hypertension Mother   . Cancer Father   . Heart disease Maternal Grandmother     Social History Reviewed with no changes to be made today.   Outpatient Medications Prior to Visit  Medication Sig Dispense Refill  . acetaminophen (TYLENOL) 500 MG tablet Take 2 tablets (1,000 mg total) by mouth every 6 (six) hours as needed. 30 tablet 0  . amLODipine (NORVASC) 10 MG tablet Take 1 tablet (10 mg total) by mouth daily. 30 tablet 0  . atorvastatin (LIPITOR) 80 MG tablet Take 1 tablet (80 mg total) by mouth daily at 6 PM. 90 tablet 2  . chlorthalidone (HYGROTON) 50 MG tablet Take 1 tablet (50 mg total) by mouth daily. IM Program 30 tablet 3  . lisinopril (PRINIVIL,ZESTRIL) 20 MG tablet Take 1 tablet (20 mg total) by mouth daily. 30 tablet 3  . SUMAtriptan (IMITREX) 100 MG tablet Take 0.5 tablets (50 mg total) by mouth daily as needed for migraine. May repeat in 2 hours if headache persists or recurs. 15 tablet 0  . vitamin B-12 (CYANOCOBALAMIN) 1000 MCG tablet Take 1,000 mcg by mouth daily.    . ferrous sulfate 325 (65 FE) MG EC tablet Take 1 tablet (325 mg total) by mouth 2 (two) times  daily. 60 tablet 3   No facility-administered medications prior to visit.     Allergies  Allergen Reactions  . Oxycodone Hives  . Percocet [Oxycodone-Acetaminophen] Hives  . Amoxicillin Hives    Has patient had a PCN reaction causing immediate rash, facial/tongue/throat swelling, SOB or lightheadedness with hypotension: No Has patient had a PCN reaction causing severe rash involving mucus membranes or skin necrosis: No Has patient had a PCN reaction that required hospitalization: No Has patient had a PCN reaction occurring within the last 10 years: Yes If all of the above answers are "NO", then may proceed with Cephalosporin use.   . Aspirin Other (See Comments)    Hx of hemorrhagic stroke (pt does take low dose aspirin )  . Latex Itching       Objective:    BP (!) 147/84   Pulse 76   Temp 98.1 F (36.7 C) (Oral)   Ht 5' 4" (1.626 m)   Wt 284 lb 3.2 oz (128.9 kg)   SpO2 100%   BMI 48.78 kg/m  Wt Readings from Last 3 Encounters:  02/24/18 284  lb 3.2 oz (128.9 kg)  02/05/18 275 lb 2.2 oz (124.8 kg)  10/12/17 274 lb 8 oz (124.5 kg)    Physical Exam Vitals signs and nursing note reviewed.  Constitutional:      Appearance: She is well-developed.  HENT:     Head: Normocephalic and atraumatic.  Neck:     Musculoskeletal: Normal range of motion.  Cardiovascular:     Rate and Rhythm: Normal rate and regular rhythm.     Heart sounds: Normal heart sounds. No murmur. No friction rub. No gallop.   Pulmonary:     Effort: Pulmonary effort is normal. No tachypnea or respiratory distress.     Breath sounds: Normal breath sounds. No decreased breath sounds, wheezing, rhonchi or rales.  Chest:     Chest wall: No tenderness.  Abdominal:     General: Bowel sounds are normal.     Palpations: Abdomen is soft.  Musculoskeletal: Normal range of motion.  Skin:    General: Skin is warm and dry.  Neurological:     Mental Status: She is alert and oriented to person, place, and time.       Coordination: Coordination normal.     Gait: Gait abnormal (uses cane for mobility).  Psychiatric:        Behavior: Behavior normal. Behavior is cooperative.        Thought Content: Thought content normal.        Judgment: Judgment normal.        Patient has been counseled extensively about nutrition and exercise as well as the importance of adherence with medications and regular follow-up. The patient was given clear instructions to go to ER or return to medical center if symptoms don't improve, worsen or new problems develop. The patient verbalized understanding.   Follow-up: Return in about 4 weeks (around 03/24/2018) for BP recheck; HTN, Needs appointment with financial representative.Gildardo Pounds, FNP-BC [facility] and [facility] [place], [place]   02/26/2018, 3:58 PM

## 2018-02-26 ENCOUNTER — Encounter: Payer: Self-pay | Admitting: Nurse Practitioner

## 2018-02-26 DIAGNOSIS — I69354 Hemiplegia and hemiparesis following cerebral infarction affecting left non-dominant side: Secondary | ICD-10-CM | POA: Insufficient documentation

## 2018-02-28 LAB — CMP14+EGFR
ALT: 8 IU/L (ref 0–32)
AST: 18 IU/L (ref 0–40)
Albumin/Globulin Ratio: 1.1 — ABNORMAL LOW (ref 1.2–2.2)
Albumin: 4.2 g/dL (ref 3.5–5.5)
Alkaline Phosphatase: 85 IU/L (ref 39–117)
BUN/Creatinine Ratio: 13 (ref 9–23)
BUN: 24 mg/dL (ref 6–24)
Bilirubin Total: 0.5 mg/dL (ref 0.0–1.2)
CHLORIDE: 102 mmol/L (ref 96–106)
CO2: 16 mmol/L — ABNORMAL LOW (ref 20–29)
Calcium: 9.6 mg/dL (ref 8.7–10.2)
Creatinine, Ser: 1.86 mg/dL — ABNORMAL HIGH (ref 0.57–1.00)
GFR calc Af Amer: 37 mL/min/{1.73_m2} — ABNORMAL LOW (ref >59)
GFR calc non Af Amer: 32 mL/min/{1.73_m2} — ABNORMAL LOW (ref >59)
GLUCOSE: 99 mg/dL (ref 65–99)
Globulin, Total: 3.9 g/dL (ref 1.5–4.5)
Potassium: 4.7 mmol/L (ref 3.5–5.2)
Sodium: 140 mmol/L (ref 134–144)
Total Protein: 8.1 g/dL (ref 6.0–8.5)

## 2018-02-28 LAB — B12 AND FOLATE PANEL

## 2018-03-01 ENCOUNTER — Other Ambulatory Visit: Payer: Self-pay | Admitting: Nurse Practitioner

## 2018-03-01 DIAGNOSIS — N183 Chronic kidney disease, stage 3 unspecified: Secondary | ICD-10-CM

## 2018-03-02 ENCOUNTER — Telehealth (INDEPENDENT_AMBULATORY_CARE_PROVIDER_SITE_OTHER): Payer: Self-pay

## 2018-03-02 NOTE — Telephone Encounter (Signed)
-----   Message from Gildardo Pounds, NP sent at 03/01/2018 11:13 PM EST ----- Kidney function has slightly improved. Will need to referred to Nephrology which is a kidney specialist for further recommendations.

## 2018-03-02 NOTE — Telephone Encounter (Signed)
Call placed to patient. Error message that call can not be completed as dialed, please hang up and try your call again later. Belinda Ramirez, CMA

## 2018-03-03 ENCOUNTER — Telehealth (INDEPENDENT_AMBULATORY_CARE_PROVIDER_SITE_OTHER): Payer: Self-pay

## 2018-03-03 ENCOUNTER — Encounter (INDEPENDENT_AMBULATORY_CARE_PROVIDER_SITE_OTHER): Payer: Self-pay

## 2018-03-03 NOTE — Telephone Encounter (Signed)
Unable to reach patient. Error message that call can not be completed at this time. Will mail unable to reach letter and results. Nat Christen, CMA

## 2018-03-03 NOTE — Telephone Encounter (Signed)
-----   Message from Gildardo Pounds, NP sent at 03/01/2018 11:13 PM EST ----- Kidney function has slightly improved. Will need to referred to Nephrology which is a kidney specialist for further recommendations.

## 2018-03-24 ENCOUNTER — Ambulatory Visit: Payer: Self-pay | Attending: Nurse Practitioner | Admitting: Nurse Practitioner

## 2018-03-24 ENCOUNTER — Encounter: Payer: Self-pay | Admitting: Nurse Practitioner

## 2018-03-24 VITALS — BP 166/115 | HR 90 | Temp 98.8°F | Ht 64.0 in | Wt 281.2 lb

## 2018-03-24 DIAGNOSIS — I10 Essential (primary) hypertension: Secondary | ICD-10-CM

## 2018-03-24 MED ORDER — ALBUTEROL SULFATE HFA 108 (90 BASE) MCG/ACT IN AERS: 2.0000 | INHALATION_SPRAY | Freq: Four times a day (QID) | RESPIRATORY_TRACT | 2 refills | Status: DC | PRN

## 2018-03-24 MED ORDER — CLONIDINE HCL 0.1 MG PO TABS: 0.1000 mg | ORAL_TABLET | Freq: Three times a day (TID) | ORAL | 3 refills | Status: DC

## 2018-03-24 MED ORDER — CLONIDINE HCL 0.1 MG PO TABS
0.2000 mg | ORAL_TABLET | Freq: Once | ORAL | Status: AC
  Administered 2018-03-24: 0.2 mg via ORAL

## 2018-03-24 MED ORDER — VITAMIN B-12 1000 MCG PO TABS: 1000.0000 ug | ORAL_TABLET | Freq: Every day | ORAL | 2 refills | Status: AC

## 2018-03-24 MED FILL — cloNIDine HCL 0.1 MG TABS: 0.1 | 30 days supply | Qty: 90 | Fill #0

## 2018-03-24 MED FILL — !VENTOLIN HFA INHALER: 108 (90 BAS | 25 days supply | Qty: 18 | Fill #0

## 2018-03-24 NOTE — Progress Notes (Signed)
Assessment & Plan:  Belinda Ramirez was seen today for follow-up.  Diagnoses and all orders for this visit:  Essential hypertension -     cloNIDine (CATAPRES) tablet 0.2 mg -     cloNIDine (CATAPRES) 0.1 MG tablet; Take 1 tablet (0.1 mg total) by mouth 2 (two) times daily. Continue all antihypertensives as prescribed.  Remember to bring in your blood pressure log with you for your follow up appointment.  DASH/Mediterranean Diets are healthier choices for HTN.    Other orders -     albuterol (PROVENTIL HFA;VENTOLIN HFA) 108 (90 Base) MCG/ACT inhaler; Inhale 2 puffs into the lungs every 6 (six) hours as needed for wheezing or shortness of breath. -     vitamin B-12 (CYANOCOBALAMIN) 1000 MCG tablet; Take 1 tablet (1,000 mcg total) by mouth daily.    Patient has been counseled on age-appropriate routine health concerns for screening and prevention. These are reviewed and up-to-date. Referrals have been placed accordingly. Immunizations are up-to-date or declined.    Subjective:   Chief Complaint  Patient presents with  . Follow-up    Pt. is here to follow-up on hypertension.    HPI Belinda Ramirez 43 y.o. female presents to office today for follow up to HTN.   Essential Hypertension She is currently taking amlodipine 10mg  daily, lisinopril 20mg  daily as prescribed. However blood pressure is not well controlled. Will start clonidine 0.1mg  BID. Denies chest pain, shortness of breath, palpitations, lightheadedness, dizziness, headaches or BLE edema.  BP Readings from Last 3 Encounters:  03/24/18 (!) 166/115  02/24/18 (!) 147/84  02/07/18 129/89    Review of Systems  Constitutional: Negative for fever, malaise/fatigue and weight loss.  HENT: Negative.  Negative for nosebleeds.   Eyes: Negative.  Negative for blurred vision, double vision and photophobia.  Respiratory: Negative.  Negative for cough and shortness of breath.   Cardiovascular: Negative.  Negative for chest pain,  palpitations and leg swelling.  Gastrointestinal: Negative.  Negative for heartburn, nausea and vomiting.  Musculoskeletal: Negative.  Negative for myalgias.  Neurological: Negative.  Negative for dizziness, focal weakness, seizures and headaches.  Psychiatric/Behavioral: Negative.  Negative for suicidal ideas.    Past Medical History:  Diagnosis Date  . High cholesterol   . 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 Hypertension   . Obesity   . Sickle cell trait ([place])   . Stroke ([place])   . Urinary incontinence    For years. Resolved 5/19 spontaneously    Past Surgical History:  Procedure Laterality Date  . TUBAL LIGATION      Family History  Problem Relation Age of Onset  . Heart attack Maternal Grandfather   . Diabetes Mother   . Hypertension Mother   . Cancer Father   . Heart disease Maternal Grandmother     Social History Reviewed with no changes to be made today.   Outpatient Medications Prior to Visit  Medication Sig Dispense Refill  . acetaminophen (TYLENOL) 500 MG tablet Take 2 tablets (1,000 mg total) by mouth every 6 (six) hours as needed. 30 tablet 0  . amLODipine (NORVASC) 10 MG tablet Take 1 tablet (10 mg total) by mouth daily. 30 tablet 0  . atorvastatin (LIPITOR) 80 MG tablet Take 1 tablet (80 mg total) by mouth daily at 6 PM.  90 tablet 2  . fluticasone (FLONASE) 50 MCG/ACT nasal spray Place 2 sprays into both nostrils daily. 16 g 6  . lisinopril (PRINIVIL,ZESTRIL) 20 MG tablet Take 1 tablet (20 mg total) by mouth daily. 30 tablet 3  . vitamin B-12 (CYANOCOBALAMIN) 1000 MCG tablet Take 1,000 mcg by mouth daily.    . ferrous sulfate 325 (65 FE) MG EC tablet Take 1 tablet (325 mg total) by mouth 2 (two) times daily. 60 tablet 3  . SUMAtriptan (IMITREX) 100 MG tablet  Take 0.5 tablets (50 mg total) by mouth daily as needed for migraine. May repeat in 2 hours if headache persists or recurs. 15 tablet 0   No facility-administered medications prior to visit.     Allergies  Allergen Reactions  . Oxycodone Hives  . Percocet [Oxycodone-Acetaminophen] Hives  . Amoxicillin Hives    Has patient had a PCN reaction causing immediate rash, facial/tongue/throat swelling, SOB or lightheadedness with hypotension: No Has patient had a PCN reaction causing severe rash involving mucus membranes or skin necrosis: No Has patient had a PCN reaction that required hospitalization: No Has patient had a PCN reaction occurring within the last 10 years: Yes If all of the above answers are "NO", then may proceed with Cephalosporin use.   . Aspirin Other (See Comments)    Hx of hemorrhagic stroke (pt does take low dose aspirin )  . Latex Itching       Objective:    BP (!) 166/115 (BP Location: Right Arm, Patient Position: Sitting, Cuff Size: Large)   Pulse 90   Temp 98.8 F (37.1 C) (Oral)   Ht 5\' 4"  (1.626 m)   Wt 281 lb 3.2 oz (127.6 kg)   SpO2 95%   BMI 48.27 kg/m  Wt Readings from Last 3 Encounters:  03/24/18 281 lb 3.2 oz (127.6 kg)  02/24/18 284 lb 3.2 oz (128.9 kg)  02/05/18 275 lb 2.2 oz (124.8 kg)    Physical Exam Vitals signs and nursing note reviewed.  Constitutional:      Appearance: She is well-developed.  HENT:     Head: Normocephalic and atraumatic.  Neck:     Musculoskeletal: Normal range of motion.  Cardiovascular:     Rate and Rhythm: Normal rate and regular rhythm.     Heart sounds: Normal heart sounds. No murmur. No friction rub. No gallop.   Pulmonary:     Effort: Pulmonary effort is normal. No tachypnea or respiratory distress.     Breath sounds: Normal breath sounds. No decreased breath sounds, wheezing, rhonchi or rales.  Chest:     Chest wall: No tenderness.  Abdominal:     General: Bowel sounds are normal.     Palpations:  Abdomen is soft.  Musculoskeletal: Normal range of motion.  Skin:    General: Skin is warm and dry.  Neurological:     Mental Status: She is alert and oriented to person, place, and time.     Coordination: Coordination normal.  Psychiatric:        Behavior: Behavior normal. Behavior is cooperative.        Thought Content: Thought content normal.        Judgment: Judgment normal.          Patient has been counseled extensively about nutrition and exercise as well as the importance of adherence with medications and regular follow-up. The patient was given clear instructions to go to ER or return to medical center if symptoms don't improve, worsen or  new problems develop. The patient verbalized understanding.   Follow-up: Return in about 3 weeks (around 04/14/2018) for BP RECHECk.   Gildardo Pounds, FNP-BC [facility] and [place], [place]   03/25/2018, 10:00 PM

## 2018-03-25 MED ORDER — CLONIDINE HCL 0.1 MG PO TABS: 0.1000 mg | ORAL_TABLET | Freq: Two times a day (BID) | ORAL | 3 refills | Status: DC

## 2018-03-28 ENCOUNTER — Emergency Department (HOSPITAL_COMMUNITY): Payer: Medicaid Other

## 2018-03-28 ENCOUNTER — Observation Stay (HOSPITAL_COMMUNITY): Payer: Medicaid Other

## 2018-03-28 ENCOUNTER — Telehealth: Payer: Self-pay

## 2018-03-28 ENCOUNTER — Inpatient Hospital Stay (HOSPITAL_COMMUNITY)
Admission: EM | Admit: 2018-03-28 | Discharge: 2018-03-31 | DRG: 065 | Disposition: A | Discharge status: Home Health | Payer: Medicaid Other | Attending: Internal Medicine | Admitting: Internal Medicine

## 2018-03-28 ENCOUNTER — Encounter (HOSPITAL_COMMUNITY): Payer: Self-pay | Admitting: Emergency Medicine

## 2018-03-28 ENCOUNTER — Other Ambulatory Visit: Payer: Self-pay

## 2018-03-28 DIAGNOSIS — R4781 Slurred speech: Secondary | ICD-10-CM | POA: Diagnosis not present

## 2018-03-28 DIAGNOSIS — Z91040 Latex allergy status: Secondary | ICD-10-CM

## 2018-03-28 DIAGNOSIS — I161 Hypertensive emergency: Secondary | ICD-10-CM | POA: Diagnosis present

## 2018-03-28 DIAGNOSIS — H539 Unspecified visual disturbance: Secondary | ICD-10-CM | POA: Diagnosis not present

## 2018-03-28 DIAGNOSIS — I69392 Facial weakness following cerebral infarction: Secondary | ICD-10-CM | POA: Diagnosis not present

## 2018-03-28 DIAGNOSIS — R29818 Other symptoms and signs involving the nervous system: Secondary | ICD-10-CM | POA: Diagnosis present

## 2018-03-28 DIAGNOSIS — Z7951 Long term (current) use of inhaled steroids: Secondary | ICD-10-CM

## 2018-03-28 DIAGNOSIS — I6381 Other cerebral infarction due to occlusion or stenosis of small artery: Secondary | ICD-10-CM

## 2018-03-28 DIAGNOSIS — H02402 Unspecified ptosis of left eyelid: Secondary | ICD-10-CM | POA: Diagnosis present

## 2018-03-28 DIAGNOSIS — Z881 Allergy status to other antibiotic agents status: Secondary | ICD-10-CM

## 2018-03-28 DIAGNOSIS — I69328 Other speech and language deficits following cerebral infarction: Secondary | ICD-10-CM | POA: Diagnosis not present

## 2018-03-28 DIAGNOSIS — I639 Cerebral infarction, unspecified: Secondary | ICD-10-CM

## 2018-03-28 DIAGNOSIS — Z6841 Body Mass Index (BMI) 40.0 and over, adult: Secondary | ICD-10-CM

## 2018-03-28 DIAGNOSIS — D573 Sickle-cell trait: Secondary | ICD-10-CM | POA: Diagnosis present

## 2018-03-28 DIAGNOSIS — I69354 Hemiplegia and hemiparesis following cerebral infarction affecting left non-dominant side: Secondary | ICD-10-CM

## 2018-03-28 DIAGNOSIS — E785 Hyperlipidemia, unspecified: Secondary | ICD-10-CM | POA: Diagnosis present

## 2018-03-28 DIAGNOSIS — N183 Chronic kidney disease, stage 3 (moderate): Secondary | ICD-10-CM | POA: Diagnosis present

## 2018-03-28 DIAGNOSIS — H538 Other visual disturbances: Secondary | ICD-10-CM | POA: Diagnosis present

## 2018-03-28 DIAGNOSIS — Z8249 Family history of ischemic heart disease and other diseases of the circulatory system: Secondary | ICD-10-CM

## 2018-03-28 DIAGNOSIS — R471 Dysarthria and anarthria: Secondary | ICD-10-CM | POA: Diagnosis present

## 2018-03-28 DIAGNOSIS — Z79899 Other long term (current) drug therapy: Secondary | ICD-10-CM

## 2018-03-28 DIAGNOSIS — Z885 Allergy status to narcotic agent status: Secondary | ICD-10-CM

## 2018-03-28 DIAGNOSIS — I129 Hypertensive chronic kidney disease with stage 1 through stage 4 chronic kidney disease, or unspecified chronic kidney disease: Secondary | ICD-10-CM | POA: Diagnosis present

## 2018-03-28 DIAGNOSIS — R29702 NIHSS score 2: Secondary | ICD-10-CM | POA: Diagnosis present

## 2018-03-28 DIAGNOSIS — G4733 Obstructive sleep apnea (adult) (pediatric): Secondary | ICD-10-CM | POA: Diagnosis present

## 2018-03-28 DIAGNOSIS — Z886 Allergy status to analgesic agent status: Secondary | ICD-10-CM

## 2018-03-28 DIAGNOSIS — R29810 Facial weakness: Secondary | ICD-10-CM | POA: Diagnosis present

## 2018-03-28 DIAGNOSIS — Z8669 Personal history of other diseases of the nervous system and sense organs: Secondary | ICD-10-CM

## 2018-03-28 LAB — COMPREHENSIVE METABOLIC PANEL
ALT: 9 U/L (ref 0–44)
AST: 12 U/L — ABNORMAL LOW (ref 15–41)
Albumin: 3.3 g/dL — ABNORMAL LOW (ref 3.5–5.0)
Alkaline Phosphatase: 70 U/L (ref 38–126)
Anion gap: 11 (ref 5–15)
BUN: 9 mg/dL (ref 6–20)
CO2: 27 mmol/L (ref 22–32)
Calcium: 9.1 mg/dL (ref 8.9–10.3)
Chloride: 102 mmol/L (ref 98–111)
Creatinine, Ser: 1.53 mg/dL — ABNORMAL HIGH (ref 0.44–1.00)
GFR calc non Af Amer: 41 mL/min — ABNORMAL LOW (ref >60)
GFR, EST AFRICAN AMERICAN: 47 mL/min — AB (ref >60)
Glucose, Bld: 114 mg/dL — ABNORMAL HIGH (ref 70–99)
Potassium: 3.9 mmol/L (ref 3.5–5.1)
Sodium: 140 mmol/L (ref 135–145)
Total Bilirubin: 0.8 mg/dL (ref 0.3–1.2)
Total Protein: 7.4 g/dL (ref 6.5–8.1)

## 2018-03-28 LAB — DIFFERENTIAL
Abs Immature Granulocytes: 0.02 10*3/uL (ref 0.00–0.07)
Basophils Absolute: 0 10*3/uL (ref 0.0–0.1)
Basophils Relative: 0 %
Eosinophils Absolute: 0.1 10*3/uL (ref 0.0–0.5)
Eosinophils Relative: 1 %
Immature Granulocytes: 0 %
Lymphocytes Relative: 29 %
Lymphs Abs: 2 10*3/uL (ref 0.7–4.0)
Monocytes Absolute: 0.5 10*3/uL (ref 0.1–1.0)
Monocytes Relative: 7 %
Neutro Abs: 4.2 10*3/uL (ref 1.7–7.7)
Neutrophils Relative %: 63 %

## 2018-03-28 LAB — CBC
HCT: 37 % (ref 36.0–46.0)
Hemoglobin: 11.7 g/dL — ABNORMAL LOW (ref 12.0–15.0)
MCH: 24.3 pg — ABNORMAL LOW (ref 26.0–34.0)
MCHC: 31.6 g/dL (ref 30.0–36.0)
MCV: 76.8 fL — AB (ref 80.0–100.0)
Platelets: 352 10*3/uL (ref 150–400)
RBC: 4.82 MIL/uL (ref 3.87–5.11)
RDW: 17.2 % — ABNORMAL HIGH (ref 11.5–15.5)
WBC: 6.8 10*3/uL (ref 4.0–10.5)
nRBC: 0 % (ref 0.0–0.2)

## 2018-03-28 LAB — APTT: aPTT: 33 seconds (ref 24–36)

## 2018-03-28 LAB — PROTIME-INR
INR: 1.06
Prothrombin Time: 13.7 seconds (ref 11.4–15.2)

## 2018-03-28 LAB — I-STAT BETA HCG BLOOD, ED (MC, WL, AP ONLY): I-stat hCG, quantitative: <5 m[IU]/mL (ref <5)

## 2018-03-28 MED ORDER — AMLODIPINE BESYLATE 10 MG PO TABS: 10.0000 mg | ORAL_TABLET | Freq: Every day | ORAL | Status: DC

## 2018-03-28 MED ORDER — ATORVASTATIN CALCIUM 80 MG PO TABS
80.0000 mg | ORAL_TABLET | Freq: Every day | ORAL | Status: DC
  Administered 2018-03-28 – 2018-03-30 (×3): 80 mg via ORAL
  Filled 2018-03-28 (×3): qty 1

## 2018-03-28 MED ORDER — ACETAMINOPHEN 160 MG/5ML PO SOLN: 650.0000 mg | ORAL | Status: DC | PRN

## 2018-03-28 MED ORDER — SENNOSIDES-DOCUSATE SODIUM 8.6-50 MG PO TABS: 1.0000 | ORAL_TABLET | Freq: Every evening | ORAL | Status: DC | PRN

## 2018-03-28 MED ORDER — LISINOPRIL 20 MG PO TABS: 20.0000 mg | ORAL_TABLET | Freq: Every day | ORAL | Status: DC

## 2018-03-28 MED ORDER — DIPHENHYDRAMINE HCL 50 MG/ML IJ SOLN
25.0000 mg | Freq: Once | INTRAMUSCULAR | Status: AC
  Administered 2018-03-28: 25 mg via INTRAVENOUS
  Filled 2018-03-28: qty 1

## 2018-03-28 MED ORDER — HYDRALAZINE HCL 25 MG PO TABS: 25.0000 mg | ORAL_TABLET | Freq: Four times a day (QID) | ORAL | Status: DC | PRN

## 2018-03-28 MED ORDER — VITAMIN B-12 1000 MCG PO TABS
1000.0000 ug | ORAL_TABLET | Freq: Every day | ORAL | Status: DC
  Administered 2018-03-29 – 2018-03-31 (×3): 1000 ug via ORAL
  Filled 2018-03-28 (×3): qty 1

## 2018-03-28 MED ORDER — SODIUM CHLORIDE 0.9% FLUSH
3.0000 mL | Freq: Once | INTRAVENOUS | Status: DC
Start: 2018-03-28 — End: 2018-03-31

## 2018-03-28 MED ORDER — STROKE: EARLY STAGES OF RECOVERY BOOK: Freq: Once | Status: DC

## 2018-03-28 MED ORDER — ASPIRIN EC 81 MG PO TBEC
81.0000 mg | DELAYED_RELEASE_TABLET | Freq: Every day | ORAL | Status: DC
  Administered 2018-03-28 – 2018-03-31 (×4): 81 mg via ORAL
  Filled 2018-03-28 (×4): qty 1

## 2018-03-28 MED ORDER — HYDRALAZINE HCL 25 MG PO TABS
25.0000 mg | ORAL_TABLET | Freq: Four times a day (QID) | ORAL | Status: DC | PRN
  Administered 2018-03-28 – 2018-03-29 (×3): 25 mg via ORAL
  Filled 2018-03-28 (×3): qty 1

## 2018-03-28 MED ORDER — ASPIRIN EC 81 MG PO TBEC: 81.0000 mg | DELAYED_RELEASE_TABLET | Freq: Every day | ORAL | Status: DC

## 2018-03-28 MED ORDER — ACETAMINOPHEN 650 MG RE SUPP: 650.0000 mg | RECTAL | Status: DC | PRN

## 2018-03-28 MED ORDER — ACETAMINOPHEN 325 MG PO TABS
650.0000 mg | ORAL_TABLET | ORAL | Status: DC | PRN
  Administered 2018-03-28 – 2018-03-30 (×3): 650 mg via ORAL
  Filled 2018-03-28 (×3): qty 2

## 2018-03-28 MED ORDER — ENOXAPARIN SODIUM 40 MG/0.4ML ~~LOC~~ SOLN
40.0000 mg | SUBCUTANEOUS | Status: DC
  Administered 2018-03-28 – 2018-03-30 (×3): 40 mg via SUBCUTANEOUS
  Filled 2018-03-28 (×3): qty 0.4

## 2018-03-28 MED ORDER — CLONIDINE HCL 0.1 MG PO TABS
0.1000 mg | ORAL_TABLET | Freq: Once | ORAL | Status: AC
  Administered 2018-03-28: 0.1 mg via ORAL
  Filled 2018-03-28: qty 1

## 2018-03-28 MED ORDER — PROCHLORPERAZINE EDISYLATE 10 MG/2ML IJ SOLN
10.0000 mg | Freq: Once | INTRAMUSCULAR | Status: AC
  Administered 2018-03-28: 10 mg via INTRAVENOUS
  Filled 2018-03-28: qty 2

## 2018-03-28 NOTE — Telephone Encounter (Signed)
CMA attempt to call patient to inform on PCP instruction.  No answer and left a VM for patient.

## 2018-03-28 NOTE — ED Notes (Signed)
Patient transported to CT 

## 2018-03-28 NOTE — H&P (Addendum)
Date: 03/28/2018          Patient Name:  Belinda Ramirez MRN: [id number]  DOB: [birth year] Age / Sex: 43 y.o., female   PCP: Gildardo Pounds, NP         Medical Service: Internal Medicine Teaching Service         Attending Physician: Dr. Sid Falcon, MD    First Contact: Dr. Annie Paras Pager: [phone]  Second Contact: Dr. Shan Levans Pager: [phone]       After Hours (After 5p/  First Contact Pager: [phone]  weekends / holidays): Second Contact Pager: [phone]   Chief Complaint: slurred speech  History of Present Illness: Belinda Ramirez is a 43 year old woman with a history of hemorrhagic CVA in 2017 with residual left-sided weakness, hypertension, hyperlipidemia, and CKDIII who presents for evaluation of 1 day of slurred speech and weakness.  History is obtained from patient and husband who is at bedside.  Patient states that she saw her primary care physician on 2/7 and she was prescribed clonidine for poorly-controlled hypertension.  She was previously taking amlodipine and lisinopril but these were discontinued at that visit.  She did not take any medications after that visit until 2/10 when she had a headache and took her first dose of clonidine.  Within an hour she experienced slurred speech and new left-sided weakness in the upper and lower extremities.  Her husband returned from work later that evening and noticed these changes, but she decided to wait until the morning to see if her symptoms improved.  When she awoke, she had worsening speech and weakness that was distinctly different from her prior stroke.  She had trouble showering and getting dressed which has never been a problem before.  Over this time her headache did not improve but the pain shifted to the front of her head.  She also noticed drooping of the left eyelid and intermittent blurry vision in her left eye.  Patient does endorse residual deficits from her stroke in 2017, including some weakness on the left side that  requires her to use a cane and a walker and prevents her from working.  On further review of systems, patient denies fevers, nausea, vomiting, loss of consciousness.  In the emergency department, patient was noted to have blood pressure of 207/110 with other vital signs normal.  CT head showed no acute process.  Neurology was consulted and recommended stroke workup and blood pressure management.  She was given clonidine, prochlorperazine, and diphenhydramine before being admitted to the internal medicine teaching service.  Medical History: PRES, 2018 CVA (hemorrhage in right basal ganglia secondary to hypertension), 2017 Hypertension Hyperlipidemia Sick cell trait Asthma  Medications: Acetaminophen 1,000mg  q6hrs prn Albuterol prn Clonidine 0.1 mg BID Ferrous sulfate 325mg  BID Fluticasone prn Sumatriptan 0.5mg  prn Amlodipine 10mg  daily Atorvastatin 80mg  daily Lisinopril 20mg  daily Vitamin B12 1,000 mcg daily  Surgical History: Tubal ligation  Allergies: Oxycodone (hives) Hydrocodone (hives) Amoxicillin (hives) Latex (itching)  Family History: Significant for mother with diabetes and hypertension, father died of unknown cancer.  Maternal grandfather died of a heart attach, maternal grandmother with heart disease.  Social History: Patient lives nearby in [place] with her husband.  She worked for [employer] for 14 years, and [employer] for 18 years, before she suffered a stroke in 2017 that left her disabled.  She denies any history of tobacco, alcohol, or illicit drug use.  Review of Systems: Comprehensive review of systems was conducted with the patient and findings are  reported in the HPI.  Physical Exam: Blood pressure (!) 172/94, pulse 60, temperature 98.1 F (36.7 C), temperature source Oral, resp. rate (!) 27, height 5\' 4"  (1.626 m), weight 127 kg, SpO2 95 %.  Gen: well-appearing, lying in bed without distress. HEENT: oropharynx without erythema or  exudate, moist mucous membranes. Pulm: unlabored breathing on room air, lung sounds distant but clear bilaterally. CV: regular rate and rhythm without murmurs. Abd: obese, soft, non-tender. Ext: 4x distal pulses palpable and symmetric.  Neuro: patient is alert and oriented , PERRL, extra occular movements intact, there is weakness on the left side of the face with slight ptosis and asymmetric smile, 5/5 right upper and lower extremity strength, 4/5 left upper and lower extremity strength, symmetric sensation to light touch throughout, finger-to-nose normal bilaterally.  Lab results: Labs at the time of admission were reviewed with the following pertinent findings:  Creatinine 1.53 Potassium 3.9 WBC 6.8 Hemoglobin 11.7 MCV 76.8  EKG shows normal sinus rhythm and incomplete left bundle branch block.  Imaging results:  CT head without contrast shows no evidence of mass lesion, acute hemorrhage, or infarction.  Assessment & Plan by Problem: Belinda Ramirez is a 43 year old woman with a history of hemorrhagic CVA in 2017, hypertension, hyperlipidemia, and CKDIII who presents with 1 day of slurred speech, left-sided weakness, and headache that occurred after she took her first dose of newly-prescribed clonidine.  Differential includes side effect of clonidine due to acute drop in blood pressure vs stroke/TIA, less likely complex migraine headache.    Slurred speech Weakness Headache: her symptoms are similar to deficits from stoke in 2017, but have worsened over the past 24 hours following first dose of clonidine on 2/10 with poorly-controlled BP.  Exam shows left hemiplegia and facial asymmetry, CT without acute process.  Consider that sudden drop in blood pressure from new medication may be causing exacerbation of her neurological deficits from her prior stroke.  May also be developing new stoke or TIA.  Will need further workup and careful control of blood pressure. - Neurology is  following, appreciate recommendations - MRI/MRA brain without contrast - Permissive HTN - Echocardiogram - Telemetry - ASA 81 mg daily - SLP consult for stroke swallow screen - PT/OT consult - serial neuro exams - A1c, lipid panel  Symptomatic hypertension: has history of hemorrhagic stoke in 2017 secondary to hypertension.  Recently seen by PCP on 2/7 and blood pressure was found to be poorly controlled, so clonidine was added to her regimen, which also includes amlodipine and lisinopril. She was not taking anti-hypertensive meds for several days after.  SBP to 200s at time of admission with headache.  Avoid dropping BP too quickly as this is likely contributing to her symptoms. - permissive HTN in the setting of acute CVA - hold home clonidine 0.1mg  BID, amlodipine 10mg  daily, and lisinopril 20mg  daily - hydralazine 25mg  prn for sbp >185  CKD III: creatinine at admission is 1.5, consistent with baseline, continue medications as above  Hyperlipidemia: continue home atorvastatin, follow up lipid panel.  FEN: hold IVF, replete electrolytes as needed, NPO pending stroke swallow screen VTE PPx: enoxaparin Code status: full code  Signed: Gypsy Decant, Medical Student 03/28/2018, 3:37 PM   Attestation for Student Documentation:  I personally was present and performed or re-performed the history, physical exam and medical decision-making activities of this service and have verified that the service and findings are accurately documented in the student's note.  , Andree Elk, MD 03/28/2018, 5:55  PM

## 2018-03-28 NOTE — ED Triage Notes (Signed)
Onset one day ago started a new medication and developed headache and left side numbness. LKW [id number] The new medication is clonidine 0.1 mg.  LVO negative. Alert answering and following commands appropriate.

## 2018-03-28 NOTE — Telephone Encounter (Signed)
-----   Message from Gildardo Pounds, NP sent at 03/25/2018 10:21 PM EST ----- Please call patient and let her know to only take clonidine twice a day instead of three times per day

## 2018-03-28 NOTE — Consult Note (Signed)
Neurology Consultation  Reason for Consult: slowed and slurry speech Referring Physician: [name]  CC: slowed and slurry speech  History is obtained from:patient  HPI: Belinda Ramirez is a 43 y.o. female with significant pmhx of stroke in 2015 which left her with L sided hemiparesis and PRES in 2018, uncontrolled HTN/HLP who presents today after noticing slurred and slowed speech yesterday at 6pm. Patient states at 4pm she developed a bad R occipital headache, she recently was taken off her BP meds and switched to clonidine 1mg  TID. She took the medication at 6pm and within minutes noticed her speech was slowed and slurred. She went about her day and in the morning and when symptoms did not improve she decided to come in. She also complains of L facial numbness which started after the medication was taken.    ROS: A 14 point ROS was performed and is negative except as noted in the HPI.   Past Medical History:  Diagnosis Date  . High cholesterol   . 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 Hypertension   . Obesity   . Sickle cell trait ([place])   . Stroke ([place])   . Urinary incontinence    For years. Resolved 5/19 spontaneously     Family History  Problem Relation Age of Onset  . Heart attack Maternal Grandfather   . Diabetes Mother   . Hypertension Mother   . Cancer Father   . Heart disease Maternal Grandmother   Social History:   reports that she has never smoked. She has never used smokeless tobacco. She reports current alcohol use. She reports that she does not use drugs.  Medications  Current Facility-Administered Medications:  .  sodium chloride flush (NS) 0.9 % injection 3 mL, 3 mL, Intravenous, Once, Blanchie Dessert, MD  Current Outpatient Medications:  .   acetaminophen (TYLENOL) 500 MG tablet, Take 2 tablets (1,000 mg total) by mouth every 6 (six) hours as needed. (Patient taking differently: Take 1,000 mg by mouth every 6 (six) hours as needed for mild pain. ), Disp: 30 tablet, Rfl: 0 .  albuterol (PROVENTIL HFA;VENTOLIN HFA) 108 (90 Base) MCG/ACT inhaler, Inhale 2 puffs into the lungs every 6 (six) hours as needed for wheezing or shortness of breath., Disp: 1 Inhaler, Rfl: 2 .  cloNIDine (CATAPRES) 0.1 MG tablet, Take 1 tablet (0.1 mg total) by mouth 2 (two) times daily for 30 days., Disp: 60 tablet, Rfl: 3 .  amLODipine (NORVASC) 10 MG tablet, Take 1 tablet (10 mg total) by mouth daily. (Patient not taking: Reported on 03/28/2018), Disp: 30 tablet, Rfl: 0 .  atorvastatin (LIPITOR) 80 MG tablet, Take 1 tablet (80 mg total) by mouth daily at 6 PM. (Patient not taking: Reported on 03/28/2018), Disp: 90 tablet, Rfl: 2 .  ferrous sulfate 325 (65 FE) MG EC tablet, Take 1 tablet (325 mg total) by mouth 2 (two) times daily. (Patient not taking: Reported on 03/28/2018), Disp: 60 tablet, Rfl: 3 .  fluticasone (FLONASE) 50 MCG/ACT nasal spray, Place 2 sprays into both nostrils daily. (Patient not taking: Reported on 03/28/2018), Disp: 16 g, Rfl: 6 .  lisinopril (PRINIVIL,ZESTRIL) 20 MG tablet, Take 1 tablet (20 mg total) by mouth daily. (Patient not taking: Reported on 03/28/2018), Disp: 30 tablet, Rfl: 3 .  SUMAtriptan (IMITREX) 100  MG tablet, Take 0.5 tablets (50 mg total) by mouth daily as needed for migraine. May repeat in 2 hours if headache persists or recurs. (Patient not taking: Reported on 03/28/2018), Disp: 15 tablet, Rfl: 0 .  vitamin B-12 (CYANOCOBALAMIN) 1000 MCG tablet, Take 1 tablet (1,000 mcg total) by mouth daily. (Patient not taking: Reported on 03/28/2018), Disp: 90 tablet, Rfl: 2   Exam: Current vital signs: BP (!) 181/97   Pulse 74   Temp 98.1 F (36.7 C) (Oral)   Resp (!) 22   Ht 5\' 4"  (1.626 m)   Wt 127 kg   SpO2 97%   BMI 48.06 kg/m   Vital signs in last 24 hours: Temp:  [98.1 F (36.7 C)] 98.1 F (36.7 C) (02/11 0949) Pulse Rate:  [72-74] 74 (02/11 1145) Resp:  [16-23] 22 (02/11 1230) BP: (126-207)/(97-110) 181/97 (02/11 1230) SpO2:  [97 %-98 %] 97 % (02/11 1145) Weight:  [509 kg] 127 kg (02/11 0951)  Physical Exam  Constitutional: Appears well-developed and well-nourished.  Psych: Affect appropriate to situation Eyes: No scleral injection HENT: No OP obstrucion Head: Normocephalic.  Cardiovascular: Normal rate and regular rhythm.    Neuro: Mental Status: Patient is awake, alert, oriented to person, place, month, year, and situation. Patient is able to give a clear and coherent history. No signs of aphasia or neglect but speech is slow with mild dysarthria Cranial Nerves: II: Visual Fields are full. Pupils are equal, round, and reactive to light.   III,IV, VI: EOMI without ptosis or diploplia.  V: Facial sensation is symmetric to light touch but states L face feel numb inside VII: Facial movement is symmetric.  VIII: hearing is intact to voice X: Uvula elevates symmetrically XI: Shoulder shrug is symmetric. XII: tongue is midline without atrophy or fasciculations.  Motor: Tone is normal. Bulk is normal. 5/5 strength was present on the L hemibody, 4/5 on the L ( at baseline as per patient, currently unchanged)  Sensory: Sensation is symmetric to light touch and temperature in the arms and legs.  Plantars: Toes are downgoing bilaterally.  Cerebellar: FNF and HKS are intact bilaterally  NIHSS 2    CBC    Component Value Date/Time   WBC 6.8 03/28/2018 1014   RBC 4.82 03/28/2018 1014   HGB 11.7 (L) 03/28/2018 1014   HGB 10.9 (L) 10/12/2017 1549   HCT 37.0 03/28/2018 1014   HCT 34.3 10/12/2017 1549   PLT 352 03/28/2018 1014   PLT 351 10/12/2017 1549   MCV 76.8 (L) 03/28/2018 1014   MCV 80 10/12/2017 1549   MCH 24.3 (L) 03/28/2018 1014   MCHC 31.6 03/28/2018 1014   RDW 17.2 (H) 03/28/2018  1014   RDW 17.7 (H) 10/12/2017 1549   LYMPHSABS 2.0 03/28/2018 1014   MONOABS 0.5 03/28/2018 1014   EOSABS 0.1 03/28/2018 1014   BASOSABS 0.0 03/28/2018 1014    CMP     Component Value Date/Time   NA 140 03/28/2018 1014   NA 140 02/24/2018 1612   K 3.9 03/28/2018 1014   CL 102 03/28/2018 1014   CO2 27 03/28/2018 1014   GLUCOSE 114 (H) 03/28/2018 1014   BUN 9 03/28/2018 1014   BUN 24 02/24/2018 1612   CREATININE 1.53 (H) 03/28/2018 1014   CALCIUM 9.1 03/28/2018 1014   PROT 7.4 03/28/2018 1014   PROT 8.1 02/24/2018 1612   ALBUMIN 3.3 (L) 03/28/2018 1014   ALBUMIN 4.2 02/24/2018 1612   AST 12 (L) 03/28/2018 1014  ALT 9 03/28/2018 1014   ALKPHOS 70 03/28/2018 1014   BILITOT 0.8 03/28/2018 1014   BILITOT 0.5 02/24/2018 1612   GFRNONAA 41 (L) 03/28/2018 1014   GFRAA 47 (L) 03/28/2018 1014    Lipid Panel     Component Value Date/Time   CHOL 218 (H) 02/05/2018 0339   TRIG 48 02/05/2018 0339   HDL 79 02/05/2018 0339   CHOLHDL 2.8 02/05/2018 0339   VLDL 10 02/05/2018 0339   LDLCALC 129 (H) 02/05/2018 0339     Imaging  CTH- no acute abnormality  03/28/2018, 1:48 PM     Assessment: 43 yo female with hx as above presents with slurred and slowed speech along with L face numbness after taking clonidine last night with no improved this morning.    Impression:TIA vs Stroke vs medication effect  Recommendations:  Admit to neuro step down on hospitalist service for stroke workup and BP management   Recommend 1. HgbA1c, fasting lipid panel 2. MRI/MRA of the brain without contrast 3. PT consult, OT consult, Speech consult 4. Echocardiogram 5. 80 mg of Atorvistatin 6. Prophylactic therapy-Antiplatelet med:ASA 81mg  daily 7. Risk factor modification 8. Telemetry monitoring 9. Frequent neuro checks 10 NPO until passes stroke swallow screen 11. SBP < 140 12 please page stroke NP  Or  PA  Or MD from 8am -4 pm  as this patient from this time will be  followed by the  stroke.   You can look them up on [email or web address]  Password [email or web address]

## 2018-03-28 NOTE — ED Notes (Signed)
Patient has arrived in room 056.  RN has not received report on this patient.

## 2018-03-28 NOTE — ED Provider Notes (Signed)
[facility] EMERGENCY DEPARTMENT Provider Note   CSN: [id number] Arrival date & time: 03/28/18  3016     History   Chief Complaint Chief Complaint  Patient presents with  . Numbness  . Headache    HPI Belinda Ramirez is a 43 y.o. female with history of hyperlipidemia, sickle cell trait, PRES syndrome, CKD, hypertension, CVA with residual hemiparesis of the left side presenting for evaluation of acute onset, persistent dysarthria.  She reports that symptoms started at around 4 PM yesterday.  She was recently started on clonidine after seeing her PCP on 03/24/2018 and took the first tablet yesterday at around 6 PM.  She reports that within an hour she developed left-sided facial numbness, blurred vision, and she thinks a facial droop.  Husband reports that she has been somewhat confused, asking repetitive questions.  She reports that she then went to bed and within around 10 minutes of awakening at around 6 AM she developed a right-sided headache radiating from the occiput to the temple.  She states it is dull and gradually became more severe but is not the most severe headache she has ever experienced.  No aggravating or alleviating factors noted.  She denies chest pain, shortness of breath, abdominal pain, nausea, or vomiting.  She has left-sided deficits at baseline and ambulates with a cane.  The history is provided by the patient.    Past Medical History:  Diagnosis Date  . High cholesterol   . 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 Hypertension   . Obesity   . Sickle cell trait ([place])   . Stroke ([place])   . Urinary incontinence    For years. Resolved 5/19 spontaneously    Patient Active Problem List   Diagnosis Date Noted  . Neurological abnormality 03/28/2018    . Hemiplegia and hemiparesis following cerebral infarction affecting left non-dominant side ([place]) 02/26/2018  . Globus pharyngeus 09/26/2017  . Left leg swelling 09/26/2017  . Blurred vision 08/17/2017  . History of CVA (cerebrovascular accident) 08/17/2017  . Urinary incontinence   . Severe hypertension 07/21/2017  . PRES (posterior reversible encephalopathy syndrome) 07/11/2017  . CKD (chronic kidney disease), stage III ([place]) 01/22/2017  . OSA (obstructive sleep apnea) 06/19/2015  . Morbid obesity due to excess calories ([place])   . Hyperlipidemia   . History of cerebral hemorrhage 04/16/2015    Past Surgical History:  Procedure Laterality Date  . TUBAL LIGATION       OB History   No obstetric history on file.      Home Medications    Prior to Admission medications   Medication Sig Start Date End Date Taking? Authorizing Provider  acetaminophen (TYLENOL) 500 MG tablet Take 2 tablets (1,000 mg total) by mouth every 6 (six) hours as needed. Patient taking differently: Take 1,000 mg by mouth every 6 (six) hours as needed for mild pain.  07/19/17  Yes McDonald, Mia A, PA-C  albuterol (PROVENTIL HFA;VENTOLIN HFA) 108 (90 Base) MCG/ACT inhaler Inhale 2 puffs into the lungs every 6 (six) hours as needed for wheezing or shortness of breath. 03/24/18  Yes Gildardo Pounds, NP  cloNIDine (CATAPRES) 0.1 MG tablet Take 1 tablet (0.1 mg total) by mouth 2 (two) times daily for 30 days. 03/25/18 04/24/18 Yes Gildardo Pounds, NP  amLODipine (NORVASC) 10 MG tablet Take 1 tablet (10 mg total) by mouth daily. Patient not taking: Reported on 03/28/2018 02/08/18   Rehman, Areeg N, DO  atorvastatin (LIPITOR) 80 MG tablet Take 1 tablet (80 mg total) by mouth daily at 6 PM. Patient not taking: Reported on 03/28/2018 08/17/17   Molt, Bethany, DO  ferrous sulfate 325 (65 FE) MG EC tablet Take 1 tablet (325 mg total) by mouth 2 (two) times daily. Patient not taking: Reported on 03/28/2018 10/13/17 02/10/18  Jean Rosenthal, MD  fluticasone (FLONASE) 50 MCG/ACT nasal spray Place 2 sprays into both nostrils daily. Patient not taking: Reported on 03/28/2018 02/24/18   Gildardo Pounds, NP  lisinopril (PRINIVIL,ZESTRIL) 20 MG tablet Take 1 tablet (20 mg total) by mouth daily. Patient not taking: Reported on 03/28/2018 09/23/17 09/23/18  Jean Rosenthal, MD  SUMAtriptan (IMITREX) 100 MG tablet Take 0.5 tablets (50 mg total) by mouth daily as needed for migraine. May repeat in 2 hours if headache persists or recurs. Patient not taking: Reported on 03/28/2018 02/07/18 03/09/18  Rehman, Areeg N, DO  vitamin B-12 (CYANOCOBALAMIN) 1000 MCG tablet Take 1 tablet (1,000 mcg total) by mouth daily. Patient not taking: Reported on 03/28/2018 03/24/18 06/22/18  Gildardo Pounds, NP    Family History Family History  Problem Relation Age of Onset  . Heart attack Maternal Grandfather   . Diabetes Mother   . Hypertension Mother   . Cancer Father   . Heart disease Maternal Grandmother     Social History Social History   Tobacco Use  . Smoking status: Never Smoker  . Smokeless tobacco: Never Used  Substance Use Topics  . Alcohol use: Yes    Alcohol/week: 0.0 standard drinks    Comment: occaisonal  . Drug use: No     Allergies   Oxycodone; Percocet [oxycodone-acetaminophen]; Amoxicillin; Aspirin; and Latex   Review of Systems Review of Systems  Constitutional: Negative for chills and fever.  Eyes: Positive for visual disturbance.  Respiratory: Negative for shortness of breath.   Cardiovascular: Negative for chest pain.  Gastrointestinal: Negative for abdominal pain, nausea and vomiting.  Neurological: Positive for facial asymmetry, numbness and headaches.  All other systems reviewed and are negative.    Physical Exam Updated Vital Signs BP (!) 178/95   Pulse (!) 58   Temp 98.1 F (36.7 C) (Oral)   Resp (!) 26   Ht 5\' 4"  (1.626 m)   Wt 127 kg   SpO2 95%   BMI 48.06 kg/m   Physical Exam Vitals signs and  nursing note reviewed.  Constitutional:      General: She is not in acute distress.    Appearance: She is well-developed. She is obese.  HENT:     Head: Normocephalic and atraumatic.  Eyes:     General:        Right eye: No discharge.        Left eye: No discharge.     Extraocular Movements: Extraocular movements intact.     Conjunctiva/sclera: Conjunctivae normal.     Pupils: Pupils are equal, round, and reactive to light.  Neck:     Musculoskeletal: Normal range of motion and neck supple.     Vascular: No JVD.     Trachea: No tracheal deviation.  Cardiovascular:     Rate and Rhythm: Normal rate and regular rhythm.  Pulmonary:     Effort: Pulmonary effort is normal.     Breath sounds: Normal breath sounds.  Abdominal:     General: Bowel sounds are normal. There is no distension.     Palpations: Abdomen is soft.  Musculoskeletal: Normal range of motion.  Skin:    General: Skin is warm and dry.     Findings: No erythema.  Neurological:     Mental Status: She is alert. She is confused.     GCS: GCS eye subscore is 4. GCS verbal subscore is 5. GCS motor subscore is 6.     Comments: Mildly dysarthric speech.  Mild left-sided facial droop.  Sensation intact to soft touch of extremities and face.  Very mild left pronator drift.  4+/5 grip strength and hip flexor strength on the left, otherwise 5/5 strength of BUE and BLE major muscle groups.  No disconjugate gaze.  Psychiatric:        Behavior: Behavior normal.      ED Treatments / Results  Labs (all labs ordered are listed, but only abnormal results are displayed) Labs Reviewed  CBC - Abnormal; Notable for the following components:      Result Value   Hemoglobin 11.7 (*)    MCV 76.8 (*)    MCH 24.3 (*)    RDW 17.2 (*)    All other components within normal limits  COMPREHENSIVE METABOLIC PANEL - Abnormal; Notable for the following components:   Glucose, Bld 114 (*)    Creatinine, Ser 1.53 (*)    Albumin 3.3 (*)    AST  12 (*)    GFR calc non Af Amer 41 (*)    GFR calc Af Amer 47 (*)    All other components within normal limits  PROTIME-INR  APTT  DIFFERENTIAL  HEMOGLOBIN A1C  LIPID PANEL  CBG MONITORING, ED  I-STAT BETA HCG BLOOD, ED ([facility], [facility], [facility] ONLY)    EKG EKG Interpretation  Date/Time:  Tuesday March 28 2018 10:08:04 EST Ventricular Rate:  68 PR Interval:  206 QRS Duration: 110 QT Interval:  440 QTC Calculation: 467 R Axis:   22 Text Interpretation:  Normal sinus rhythm Incomplete left bundle branch block No significant change since last tracing Confirmed by Blanchie Dessert [phone]) on 03/28/2018 11:49:40 AM   Radiology Ct Head Wo Contrast  Result Date: 03/28/2018 CLINICAL DATA:  Headache. EXAM: CT HEAD WITHOUT CONTRAST TECHNIQUE: Contiguous axial images were obtained from the base of the skull through the vertex without intravenous contrast. COMPARISON:  CT scan and MRI scan of February 04, 2018. FINDINGS: Brain: Mild chronic ischemic white matter disease is noted. No mass effect or midline shift is noted. Ventricular size is within normal limits. There is no evidence of mass lesion, hemorrhage or acute infarction. Vascular: No hyperdense vessel or unexpected calcification. Skull: Normal. Negative for fracture or focal lesion. Sinuses/Orbits: Right maxillary mucous retention cyst is noted. Mild left maxillary sinusitis is noted. Other: None. IMPRESSION: Mild chronic ischemic white matter disease. No acute intracranial abnormality seen. Electronically Signed   By: Marijo Conception, M.D.   On: 03/28/2018 11:44    Procedures Procedures (including critical care time)  Medications Ordered in ED Medications  sodium chloride flush (NS) 0.9 % injection 3 mL (has no administration in time range)   stroke: mapping our early stages of recovery book (has no administration in time range)  acetaminophen (TYLENOL) tablet 650 mg (has no administration in time range)    Or  acetaminophen (TYLENOL)  solution 650 mg (has no administration in time range)    Or  acetaminophen (TYLENOL) suppository 650  mg (has no administration in time range)  senna-docusate (Senokot-S) tablet 1 tablet (has no administration in time range)  enoxaparin (LOVENOX) injection 40 mg (has no administration in time range)  atorvastatin (LIPITOR) tablet 80 mg (has no administration in time range)  lisinopril (PRINIVIL,ZESTRIL) tablet 20 mg (has no administration in time range)  amLODipine (NORVASC) tablet 10 mg (has no administration in time range)  cloNIDine (CATAPRES) tablet 0.1 mg (0.1 mg Oral Given 03/28/18 1337)  prochlorperazine (COMPAZINE) injection 10 mg (10 mg Intravenous Given 03/28/18 1343)  diphenhydrAMINE (BENADRYL) injection 25 mg (25 mg Intravenous Given 03/28/18 1343)     Initial Impression / Assessment and Plan / ED Course  I have reviewed the triage vital signs and the nursing notes.  Pertinent labs & imaging results that were available during my care of the patient were reviewed by me and considered in my medical decision making (see chart for details).     Patient presenting for evaluation of headaches and left-sided facial droop and dysarthric speech.  She is a somewhat poor historian and it is difficult to establish a clear timeline but from what I can ascertain her symptoms began around 4 PM yesterday.  She was recently started on clonidine for her blood pressure resistant to treatment with other medications.  She was quite hypertensive on initial evaluation.  Very mild left-sided deficits noted which could be chronic from a prior stroke; she does report that the weakness noted on examination of the upper and lower extremities is baseline for her.  Spoke with Dr. Cristobal Goldmann with neurology who will see and evaluate the patient but presently the patient is not LVO positive and so a code stroke was not called.  Her CT scan shows mild chronic ischemic white matter disease with no acute intracranial  abnormality.  EKG shows normal sinus rhythm with no significant changes from last tracing.  Remainder of lab work reviewed by me shows elevated creatinine, mild anemia.  No metabolic derangements.  She was given a dose of clonidine in the ED with greater than 10% reduction in her blood pressure.  Neurology recommends admission for stroke work-up and blood pressure management.  Internal medicine teaching service to admit.  Will order MRI/MRA of the brain without contrast.   Final Clinical Impressions(s) / ED Diagnoses   Final diagnoses:  Hypertensive emergency    ED Discharge Orders    None       Debroah Baller 03/28/18 1724    Blanchie Dessert, MD 03/28/18 2028

## 2018-03-28 NOTE — ED Notes (Signed)
[facility] at bedside

## 2018-03-29 ENCOUNTER — Observation Stay (HOSPITAL_BASED_OUTPATIENT_CLINIC_OR_DEPARTMENT_OTHER): Payer: Medicaid Other

## 2018-03-29 ENCOUNTER — Inpatient Hospital Stay (HOSPITAL_COMMUNITY): Payer: Medicaid Other

## 2018-03-29 DIAGNOSIS — I161 Hypertensive emergency: Secondary | ICD-10-CM | POA: Diagnosis present

## 2018-03-29 DIAGNOSIS — R739 Hyperglycemia, unspecified: Secondary | ICD-10-CM

## 2018-03-29 DIAGNOSIS — I639 Cerebral infarction, unspecified: Secondary | ICD-10-CM

## 2018-03-29 DIAGNOSIS — R4781 Slurred speech: Secondary | ICD-10-CM | POA: Diagnosis not present

## 2018-03-29 DIAGNOSIS — Z8249 Family history of ischemic heart disease and other diseases of the circulatory system: Secondary | ICD-10-CM | POA: Diagnosis not present

## 2018-03-29 DIAGNOSIS — N183 Chronic kidney disease, stage 3 (moderate): Secondary | ICD-10-CM | POA: Diagnosis present

## 2018-03-29 DIAGNOSIS — Z886 Allergy status to analgesic agent status: Secondary | ICD-10-CM | POA: Diagnosis not present

## 2018-03-29 DIAGNOSIS — I6381 Other cerebral infarction due to occlusion or stenosis of small artery: Secondary | ICD-10-CM | POA: Diagnosis present

## 2018-03-29 DIAGNOSIS — Z6841 Body Mass Index (BMI) 40.0 and over, adult: Secondary | ICD-10-CM | POA: Diagnosis not present

## 2018-03-29 DIAGNOSIS — R29810 Facial weakness: Secondary | ICD-10-CM | POA: Diagnosis present

## 2018-03-29 DIAGNOSIS — R471 Dysarthria and anarthria: Secondary | ICD-10-CM | POA: Diagnosis present

## 2018-03-29 DIAGNOSIS — H538 Other visual disturbances: Secondary | ICD-10-CM

## 2018-03-29 DIAGNOSIS — H02402 Unspecified ptosis of left eyelid: Secondary | ICD-10-CM | POA: Diagnosis present

## 2018-03-29 DIAGNOSIS — I129 Hypertensive chronic kidney disease with stage 1 through stage 4 chronic kidney disease, or unspecified chronic kidney disease: Secondary | ICD-10-CM | POA: Diagnosis present

## 2018-03-29 DIAGNOSIS — I69354 Hemiplegia and hemiparesis following cerebral infarction affecting left non-dominant side: Secondary | ICD-10-CM | POA: Diagnosis not present

## 2018-03-29 DIAGNOSIS — E785 Hyperlipidemia, unspecified: Secondary | ICD-10-CM | POA: Diagnosis present

## 2018-03-29 DIAGNOSIS — Z79899 Other long term (current) drug therapy: Secondary | ICD-10-CM | POA: Diagnosis not present

## 2018-03-29 DIAGNOSIS — I6359 Cerebral infarction due to unspecified occlusion or stenosis of other cerebral artery: Secondary | ICD-10-CM | POA: Diagnosis not present

## 2018-03-29 DIAGNOSIS — G8194 Hemiplegia, unspecified affecting left nondominant side: Secondary | ICD-10-CM

## 2018-03-29 DIAGNOSIS — Z885 Allergy status to narcotic agent status: Secondary | ICD-10-CM | POA: Diagnosis not present

## 2018-03-29 DIAGNOSIS — Z7951 Long term (current) use of inhaled steroids: Secondary | ICD-10-CM | POA: Diagnosis not present

## 2018-03-29 DIAGNOSIS — D573 Sickle-cell trait: Secondary | ICD-10-CM | POA: Diagnosis present

## 2018-03-29 DIAGNOSIS — R29702 NIHSS score 2: Secondary | ICD-10-CM | POA: Diagnosis present

## 2018-03-29 DIAGNOSIS — G4733 Obstructive sleep apnea (adult) (pediatric): Secondary | ICD-10-CM | POA: Diagnosis present

## 2018-03-29 DIAGNOSIS — Z91040 Latex allergy status: Secondary | ICD-10-CM | POA: Diagnosis not present

## 2018-03-29 DIAGNOSIS — Z881 Allergy status to other antibiotic agents status: Secondary | ICD-10-CM | POA: Diagnosis not present

## 2018-03-29 LAB — HEMOGLOBIN A1C
Hgb A1c MFr Bld: 6.2 % — ABNORMAL HIGH (ref 4.8–5.6)
Mean Plasma Glucose: 131.24 mg/dL

## 2018-03-29 LAB — LIPID PANEL
Cholesterol: 170 mg/dL (ref 0–200)
HDL: 60 mg/dL (ref >40)
LDL Cholesterol: 99 mg/dL (ref 0–99)
Total CHOL/HDL Ratio: 2.8 RATIO
Triglycerides: 56 mg/dL (ref <150)
VLDL: 11 mg/dL (ref 0–40)

## 2018-03-29 LAB — ECHOCARDIOGRAM COMPLETE
Height: 64 in
Weight: 4479.75 oz

## 2018-03-29 MED ORDER — PERFLUTREN LIPID MICROSPHERE
1.0000 mL | INTRAVENOUS | Status: AC | PRN
  Administered 2018-03-29: 2 mL via INTRAVENOUS
  Filled 2018-03-29: qty 10

## 2018-03-29 NOTE — Care Management Note (Signed)
Case Management Note  Patient Details  Name: Belinda Ramirez MRN: [id number] Date of Birth: 1975-10-13  Subjective/Objective:     Pt admitted with a stroke. She is from home with spouse.  DME: walker, cane Transportation: [facility] No issues with home medications--uses [facility] PCP: [facility] No insurance               Action/Plan: Awaiting PT/OT evals. CM following for d/c needs, physician orders.  Expected Discharge Date:                  Expected Discharge Plan:     In-House Referral:     Discharge planning Services     Post Acute Care Choice:    Choice offered to:     DME Arranged:    DME Agency:     HH Arranged:    HH Agency:     Status of Service:  In process, will continue to follow  If discussed at Long Length of Stay Meetings, dates discussed:    Additional Comments:  Pollie Friar, RN 03/29/2018, 10:52 AM

## 2018-03-29 NOTE — Progress Notes (Signed)
Patient had a 2.73 pause and heart rate dropped to 38. Patient bradycardic with heart rate at 38. Patient denies any chest pain or SOB. Dr Nathanial Rancher made aware.

## 2018-03-29 NOTE — Evaluation (Signed)
Speech Language Pathology Evaluation Patient Details Name: Belinda Ramirez MRN: [id number] DOB: 1975/03/18 Today's Date: 03/29/2018 Time: 1400-1430 SLP Time Calculation (min) (ACUTE ONLY): 30 min  Problem List:  Patient Active Problem List   Diagnosis Date Noted  . Neurological abnormality 03/28/2018  . Hemiplegia and hemiparesis following cerebral infarction affecting left non-dominant side ([place]) 02/26/2018  . Globus pharyngeus 09/26/2017  . Left leg swelling 09/26/2017  . Blurred vision 08/17/2017  . History of CVA (cerebrovascular accident) 08/17/2017  . Urinary incontinence   . Severe hypertension 07/21/2017  . PRES (posterior reversible encephalopathy syndrome) 07/11/2017  . CKD (chronic kidney disease), stage III ([place]) 01/22/2017  . OSA (obstructive sleep apnea) 06/19/2015  . Morbid obesity due to excess calories ([place])   . Hyperlipidemia   . History of cerebral hemorrhage 04/16/2015   Past Medical History:  Past Medical History:  Diagnosis Date  . High cholesterol   . 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 Hypertension   . Obesity   . Sickle cell trait ([place])   . Stroke ([place])   . Urinary incontinence    For years. Resolved 5/19 spontaneously   Past Surgical History:  Past Surgical History:  Procedure Laterality Date  . TUBAL LIGATION     HPI:  Pt is a 43 y/o female admitted secondary to slurred speech. MRI revealed 1 cm acute ischemic nonhemorrhagic white matter infarct involving the posterior right corona radiata. Additional acute to early subacute 6 mm nonhemorrhagic white matter infarct involving the overlying right centrum semi ovale. PMH including but not limited to HTN, sickle cell trait, prior cerebral hemorrhage and cerebral infarct 2017.   Assessment / Plan  / Recommendation Clinical Impression  Pt presents with clear, fluent speech.  Dysarthria has resolved.  Language is intact as expected based on neuropathology.  Pt presents with adequate immediate, delayed, and prospective recall.  Verbal problem-solving and awareness of deficits are WNL.  Higher-level orders of attention are WNL.  No SLP intervention is needed - our service will sign off. Pt verbalizes understanding.     SLP Assessment  SLP Recommendation/Assessment: Patient does not need any further Speech Lanaguage Pathology Services SLP Visit Diagnosis: Cognitive communication deficit (R41.841)    Follow Up Recommendations  None    Frequency and Duration           SLP Evaluation Cognition  Overall Cognitive Status: Within Functional Limits for tasks assessed Arousal/Alertness: Awake/alert Orientation Level: Oriented X4 Attention: Selective Selective Attention: Appears intact Memory: Appears intact Awareness: Appears intact Problem Solving: Appears intact Safety/Judgment: Appears intact       Comprehension  Auditory Comprehension Overall Auditory Comprehension: Appears within functional limits for tasks assessed Visual Recognition/Discrimination Discrimination: Within Function Limits Reading Comprehension Reading Status: Within funtional limits    Expression Expression Primary Mode of Expression: Verbal Verbal Expression Overall Verbal Expression: Appears within functional limits for tasks assessed Written Expression Dominant Hand: Right Written Expression: Not tested   Oral / Motor  Oral Motor/Sensory Function Overall Oral Motor/Sensory Function: Within functional limits Motor Speech Overall Motor Speech: Appears within functional limits for tasks assessed   GO                    [name] 03/29/2018, 2:40 PM  Yuvonne Lanahan L. Tricia Pledger, MA CCC/SLP Acute Rehabilitation  Services Office number [phone] Pager [phone]

## 2018-03-29 NOTE — Evaluation (Signed)
Occupational Therapy Evaluation Patient Details Name: Belinda Ramirez MRN: [id number] DOB: 05/31/1975 Today's Date: 03/29/2018    History of Present Illness Pt is a 43 y/o female admitted secondary to slurred speech. MRI revealed 1 cm acute ischemic nonhemorrhagic white matter infarct involving the posterior right corona radiata. Additional acute to early subacute 6 mm nonhemorrhagic white matter infarct involving the overlying right centrum semi ovale. PMH including but not limited to HTN, sickle cell trait, prior cerebral hemorrhage and cerebral infarct.   Clinical Impression   PTA, pt was living with her husband and was independent with ADLs and simple IADLs; using cane as needed. Pt currently performing ADLs at supervision level. Provided education on safe tub transfer and use of 3N1. Pt demonstrated understanding and performed with supervision. Recommend dc home once medically stable per physician. Answered all pt and family questions and all acute OT needs met. Will sign off.   BP supine before session: 175/81 (107) BP supine after session: 187/72 (106)  RN notified.    Follow Up Recommendations  No OT follow up;Supervision/Assistance - 24 hour    Equipment Recommendations  3 in 1 bedside commode    Recommendations for Other Services PT consult     Precautions / Restrictions Precautions Precautions: Fall Restrictions Weight Bearing Restrictions: No      Mobility Bed Mobility Overal bed mobility: Needs Assistance Bed Mobility: Supine to Sit;Sit to Supine     Supine to sit: Supervision Sit to supine: Supervision   General bed mobility comments: for safety  Transfers Overall transfer level: Needs assistance Equipment used: None;1 person hand held assist Transfers: Sit to/from Stand Sit to Stand: Min guard         General transfer comment: min guard for safety,    Balance Overall balance assessment: Needs assistance Sitting-balance support: Feet  supported Sitting balance-Leahy Scale: Good     Standing balance support: Single extremity supported;Bilateral upper extremity supported Standing balance-Leahy Scale: Fair                             ADL either performed or assessed with clinical judgement   ADL Overall ADL's : Needs assistance/impaired                                       General ADL Comments: Pt performing ADLs at supervision level near baseline function. Pt performing toileting, hand hygiene, and tub transfer with supervision. Providing pt and husband with education on safe tub transfer and use of 3N1 as shower seat. Pt had been crawling into tub and getting down into it with husband assistance to pull out. Recommended as purchase of HH shower head.      Vision         Perception     Praxis      Pertinent Vitals/Pain Pain Assessment: No/denies pain     Hand Dominance Right   Extremity/Trunk Assessment Upper Extremity Assessment Upper Extremity Assessment: Overall WFL for tasks assessed   Lower Extremity Assessment Lower Extremity Assessment: Generalized weakness   Cervical / Trunk Assessment Cervical / Trunk Assessment: Other exceptions Cervical / Trunk Exceptions: Increased body habitus   Communication Communication Communication: No difficulties   Cognition Arousal/Alertness: Awake/alert Behavior During Therapy: WFL for tasks assessed/performed;Flat affect Overall Cognitive Status: Within Functional Limits for tasks assessed  General Comments  Husband at bedside. Pt with elevated BP adn RN notified; supine on RUE 175/81. At end of session, BP 187/72    Exercises     Shoulder Instructions      Home Living Family/patient expects to be discharged to:: Private residence Living Arrangements: Spouse/significant other Available Help at Discharge: Family;Available PRN/intermittently Type of Home: Apartment Home  Access: Stairs to enter Entrance Stairs-Number of Steps: 2   Home Layout: One level     Bathroom Shower/Tub: Teacher, early years/pre: Standard     Home Equipment: Cane - single point;Walker - 4 wheels      Lives With: Spouse    Prior Functioning/Environment Level of Independence: Independent with assistive device(s)        Comments: ambulates with either a rollator or cane        OT Problem List: Decreased activity tolerance;Impaired balance (sitting and/or standing);Decreased knowledge of precautions;Decreased knowledge of use of DME or AE      OT Treatment/Interventions:      OT Goals(Current goals can be found in the care plan section) Acute Rehab OT Goals Patient Stated Goal: to get stronger OT Goal Formulation: All assessment and education complete, DC therapy  OT Frequency:     Barriers to D/C:            Co-evaluation              AM-PAC OT "6 Clicks" Daily Activity     Outcome Measure Help from another person eating meals?: None Help from another person taking care of personal grooming?: None Help from another person toileting, which includes using toliet, bedpan, or urinal?: None Help from another person bathing (including washing, rinsing, drying)?: None Help from another person to put on and taking off regular upper body clothing?: None Help from another person to put on and taking off regular lower body clothing?: None 6 Click Score: 24   End of Session Nurse Communication: Mobility status  Activity Tolerance: Patient tolerated treatment well Patient left: in bed;with call bell/phone within reach;with bed alarm set;with family/visitor present  OT Visit Diagnosis: Other abnormalities of gait and mobility (R26.89);Muscle weakness (generalized) (M62.81)                Time: 5051-8335 OT Time Calculation (min): 19 min Charges:  OT General Charges $OT Visit: 1 Visit OT Evaluation $OT Eval Moderate Complexity: Maxwell, OTR/L Acute Rehab Pager: [phone] Office: [place] 03/29/2018, 5:28 PM

## 2018-03-29 NOTE — Progress Notes (Signed)
  Echocardiogram 2D Echocardiogram has been performed.  Belinda Ramirez 03/29/2018, 9:26 AM

## 2018-03-29 NOTE — Evaluation (Signed)
Physical Therapy Evaluation Patient Details Name: Belinda Ramirez MRN: [id number] DOB: [birth year] Today's Date: 03/29/2018   History of Present Illness  Pt is a 43 y/o female admitted secondary to slurred speech. MRI revealed 1 cm acute ischemic nonhemorrhagic white matter infarct involving the posterior right corona radiata. Additional acute to early subacute 6 mm nonhemorrhagic white matter infarct involving the overlying right centrum semi ovale. PMH including but not limited to HTN, sickle cell trait, prior cerebral hemorrhage and cerebral infarct.  Clinical Impression  Pt presented supine in bed with HOB elevated, awake and willing to participate in therapy session. Prior to admission, pt reported that she ambulated with use of a cane or rollator depending on the day. Pt currently limited in general due to weakness, fatigue and deconditioning. She is at supervision to min guard level with use of RW to ambulate a short distance. Pt would continue to benefit from skilled physical therapy services at this time while admitted and after d/c to address the below listed limitations in order to improve overall safety and independence with functional mobility.     Follow Up Recommendations Home health PT    Equipment Recommendations  3in1 (PT)    Recommendations for Other Services       Precautions / Restrictions Precautions Precautions: Fall Restrictions Weight Bearing Restrictions: No      Mobility  Bed Mobility Overal bed mobility: Needs Assistance Bed Mobility: Supine to Sit     Supine to sit: Supervision     General bed mobility comments: for safety  Transfers Overall transfer level: Needs assistance Equipment used: Rolling walker (2 wheeled) Transfers: Sit to/from Stand Sit to Stand: Min guard         General transfer comment: min guard for safety, cueing for hand placement  Ambulation/Gait Ambulation/Gait assistance: Min guard Gait Distance (Feet): 50  Feet Assistive device: Rolling walker (2 wheeled) Gait Pattern/deviations: Step-through pattern;Decreased stride length Gait velocity: decreased   General Gait Details: pt with slow, cautious gait; no instability but limited secondary to fatigue  Stairs            Wheelchair Mobility    Modified Rankin (Stroke Patients Only) Modified Rankin (Stroke Patients Only) Pre-Morbid Rankin Score: Moderate disability Modified Rankin: Moderate disability     Balance Overall balance assessment: Needs assistance Sitting-balance support: Feet supported Sitting balance-Leahy Scale: Good     Standing balance support: Single extremity supported;Bilateral upper extremity supported Standing balance-Leahy Scale: Poor                               Pertinent Vitals/Pain Pain Assessment: No/denies pain    Home Living Family/patient expects to be discharged to:: Private residence Living Arrangements: Spouse/significant other Available Help at Discharge: Family;Available PRN/intermittently Type of Home: Apartment Home Access: Stairs to enter   Entrance Stairs-Number of Steps: 2 Home Layout: One level Home Equipment: Cane - single point;Walker - 4 wheels      Prior Function Level of Independence: Independent with assistive device(s)         Comments: ambulates with either a rollator or cane     Hand Dominance   Dominant Hand: Right    Extremity/Trunk Assessment   Upper Extremity Assessment Upper Extremity Assessment: Defer to OT evaluation;Generalized weakness    Lower Extremity Assessment Lower Extremity Assessment: Generalized weakness       Communication   Communication: No difficulties  Cognition Arousal/Alertness: Awake/alert Behavior During Therapy: Flat affect  Overall Cognitive Status: Within Functional Limits for tasks assessed                                        General Comments      Exercises     Assessment/Plan     PT Assessment Patient needs continued PT services  PT Problem List Decreased strength;Decreased balance;Decreased mobility;Decreased coordination;Decreased knowledge of use of DME;Decreased safety awareness;Decreased knowledge of precautions       PT Treatment Interventions DME instruction;Gait training;Stair training;Functional mobility training;Therapeutic activities;Therapeutic exercise;Balance training;Neuromuscular re-education;Patient/family education    PT Goals (Current goals can be found in the Care Plan section)  Acute Rehab PT Goals Patient Stated Goal: to get stronger PT Goal Formulation: With patient Time For Goal Achievement: 04/12/18 Potential to Achieve Goals: Good    Frequency Min 4X/week   Barriers to discharge        Co-evaluation               AM-PAC PT "6 Clicks" Mobility  Outcome Measure Help needed turning from your back to your side while in a flat bed without using bedrails?: None Help needed moving from lying on your back to sitting on the side of a flat bed without using bedrails?: None Help needed moving to and from a bed to a chair (including a wheelchair)?: None Help needed standing up from a chair using your arms (e.g., wheelchair or bedside chair)?: None Help needed to walk in hospital room?: A Little Help needed climbing 3-5 steps with a railing? : A Little 6 Click Score: 22    End of Session Equipment Utilized During Treatment: Gait belt Activity Tolerance: Patient tolerated treatment well Patient left: in chair;with call bell/phone within reach;with chair alarm set Nurse Communication: Mobility status PT Visit Diagnosis: Other abnormalities of gait and mobility (R26.89)    Time: 8412-8208 PT Time Calculation (min) (ACUTE ONLY): 23 min   Charges:   PT Evaluation $PT Eval Moderate Complexity: 1 Mod PT Treatments $Gait Training: 8-22 mins        Sherie Don, PT, DPT  Acute Rehabilitation Services Pager  [phone] Office [place] 03/29/2018, 12:57 PM

## 2018-03-29 NOTE — Progress Notes (Signed)
Carotid duplex has been completed.   Preliminary results in CV Proc.   Abram Sander 03/29/2018 3:28 PM

## 2018-03-29 NOTE — Progress Notes (Signed)
   Subjective:  This morning Ms. Kimery is in good spirits and feels like her symptoms have improved.  Her speech is more clear although she says her tongue still feels heavy.  She still feels some left-sided weakness but has been able to ambulate with a walker.  Says she is urinating more than usual but denies dysuria.  Passed her bedside swallow evaluation and is eating and drinking without difficulty.  Patient and husband requesting resources to learn about diets to prevent high blood pressure and heart disease.  We discussed our plan for management and addressed all questions.  Objective:  Vital signs in last 24 hours: Vitals:   03/28/18 1600 03/28/18 1940 03/29/18 0001 03/29/18 0309  BP: (!) 178/95 (!) 191/95 (!) 155/73 (!) 188/102  Pulse: (!) 58 63 62 (!) 57  Resp: (!) 26 20 16  (!) 21  Temp:  98.3 F (36.8 C) 98.7 F (37.1 C) 98.4 F (36.9 C)  TempSrc:   Oral Oral  SpO2: 95% 96% 100% 100%  Weight:      Height:       Gen: pleasant, well-appearing, sitting up in bed without distress. Pulm: breathing comfortably on room air, lungs clear bilaterally. CV: regular rate and rhythm without murmurs. Abd: obese, soft, non-tender. Ext: 4x distal pulses palpable and symmetric, no lower extremity swelling. Neuro: alert and oriented, left-sided weakness in face, upper, and lower extremities is appreciated but improved from initial exam. Dysarthria has improved. Left sided facial droop has also improved, but it still slightly present.  Assessment/Plan:  Belinda Ramirez is a 43 year old woman with a history of hemorrhagic CVA in 2017, hypertension, hyperlipidemia, and CKDIII who presents with 1 day of slurred speech, left hemiplegia, and headache that occurred after she took her first dose of newly-prescribed clonidine.  MRI head shows acute nonhemorrhagic infarcts in corona radiata and centrum semiovale.  Ischemic stroke Symptomatic hypertension: MRI/MRA shows acute 10 mm nonhemorrhagic  infarct of corona radiata and early subacute 6 mm nonhemorrhagic infarct of centrum ovale. Echocardiogram with LVH, EF > 65%, no valvular disease, no vegetations, no PFO. CVA is likely due to long-standing and poorly-controlled hypertension.  Patient exhibiting left hemiplegia which is improving on the second day of hospitalization though not back to baseline.  Will require careful blood pressure control for next 24-48 hours and home health PT upon discharge. - Neurology following, appreciate recs - Permissive hypertension to SBP 185 - Holding home amlodipine, lisinopril, clonidine - Hydralazine PRN for SBP > 185 - Aspirin 81 mg daily - Follow up carotid US - Telemetry - may have soft diet today  CKD III: creatinine on admission 1.5, consistent with baseline.    Elevated A1c: A1c is 6.2%, she will need outpatient f/u with her PCP to optimize blood sugars.  Hyperlipidemia: lipid panel shows total cholesterol 170, HDL 60, LDL 99.  Continue home atorvastatin.  FEN: holding IV fluids, replete electrolytes PRN, soft diet VTE Prophylaxis: enoxaparin Code status: full code  Dispo: Anticipated discharge in approximately 2-3 days.  Gypsy Decant, Medical Student 03/29/2018, 8:36 AM  Attestation for Student Documentation:  I personally was present and performed or re-performed the history, physical exam and medical decision-making activities of this service and have verified that the service and findings are accurately documented in the student's note.  Dorrell, Andree Elk, MD 03/29/2018, 1:43 PM

## 2018-03-29 NOTE — Telephone Encounter (Signed)
CMA spoke to patient to inform to take Clonidine 2x a day.  Pt. Understood. Pt. Verified DOB.

## 2018-03-29 NOTE — Progress Notes (Signed)
Patient had a 3.74 pause, Dr Eileen Stanford notified.

## 2018-03-29 NOTE — Progress Notes (Signed)
STROKE TEAM PROGRESS NOTE   INTERVAL HISTORY No family is at the bedside.  Speech and numbness resolved  Vitals:   03/28/18 1600 03/28/18 1940 03/29/18 0001 03/29/18 0309  BP: (!) 178/95 (!) 191/95 (!) 155/73 (!) 188/102  Pulse: (!) 58 63 62 (!) 57  Resp: (!) 26 20 16  (!) 21  Temp:  98.3 F (36.8 C) 98.7 F (37.1 C) 98.4 F (36.9 C)  TempSrc:   Oral Oral  SpO2: 95% 96% 100% 100%  Weight:      Height:        CBC:  Recent Labs  Lab 03/28/18 1014  WBC 6.8  NEUTROABS 4.2  HGB 11.7*  HCT 37.0  MCV 76.8*  PLT 782    Basic Metabolic Panel:  Recent Labs  Lab 03/28/18 1014  NA 140  K 3.9  CL 102  CO2 27  GLUCOSE 114*  BUN 9  CREATININE 1.53*  CALCIUM 9.1   Lipid Panel:     Component Value Date/Time   CHOL 170 03/29/2018 0505   TRIG 56 03/29/2018 0505   HDL 60 03/29/2018 0505   CHOLHDL 2.8 03/29/2018 0505   VLDL 11 03/29/2018 0505   LDLCALC 99 03/29/2018 0505   HgbA1c:  Lab Results  Component Value Date   HGBA1C 6.2 (H) 03/29/2018   Urine Drug Screen:     Component Value Date/Time   LABOPIA NONE DETECTED 02/05/2018 0141   COCAINSCRNUR NONE DETECTED 02/05/2018 0141   LABBENZ NONE DETECTED 02/05/2018 0141   AMPHETMU NONE DETECTED 02/05/2018 0141   THCU NONE DETECTED 02/05/2018 0141   LABBARB NONE DETECTED 02/05/2018 0141    Alcohol Level No results found for: [facility]  IMAGING Ct Head Wo Contrast  Result Date: 03/28/2018 CLINICAL DATA:  Headache. EXAM: CT HEAD WITHOUT CONTRAST TECHNIQUE: Contiguous axial images were obtained from the base of the skull through the vertex without intravenous contrast. COMPARISON:  CT scan and MRI scan of February 04, 2018. FINDINGS: Brain: Mild chronic ischemic white matter disease is noted. No mass effect or midline shift is noted. Ventricular size is within normal limits. There is no evidence of mass lesion, hemorrhage or acute infarction. Vascular: No hyperdense vessel or unexpected calcification. Skull: Normal. Negative for  fracture or focal lesion. Sinuses/Orbits: Right maxillary mucous retention cyst is noted. Mild left maxillary sinusitis is noted. Other: None. IMPRESSION: Mild chronic ischemic white matter disease. No acute intracranial abnormality seen. Electronically Signed   By: Marijo Conception, M.D.   On: 03/28/2018 11:44   Mr Jodene Nam Head Wo Contrast  Result Date: 03/28/2018 CLINICAL DATA:  Initial evaluation for acute stroke. EXAM: MRI HEAD WITHOUT CONTRAST MRA HEAD WITHOUT CONTRAST TECHNIQUE: Multiplanar, multiecho pulse sequences of the brain and surrounding structures were obtained without intravenous contrast. Angiographic images of the head were obtained using MRA technique without contrast. COMPARISON:  Prior CT from earlier the same day. FINDINGS: MRI HEAD FINDINGS Brain: Cerebral volume within normal limits for age. Moderate chronic microvascular ischemic change present within the periventricular deep white matter both cerebral hemispheres. Multiple scattered superimposed remote lacunar infarcts seen involving the bilateral basal ganglia, thalami, and pons. 1 cm acute ischemic nonhemorrhagic infarct present at the posterior right corona radiata (series 3, image 36). Additional faint acute to early subacute 6 mm nonhemorrhagic infarct seen just superiorly within the right centrum semi ovale (series 3, image 40). No other evidence for acute or subacute ischemia. Gray-white matter differentiation otherwise maintained. No acute intracranial hemorrhage. Innumerable scattered chronic micro hemorrhages  noted, most notable about the cerebellum, brainstem, and deep gray nuclei, compatible with chronic poorly controlled hypertension. No mass lesion, midline shift or mass effect. No hydrocephalus. No extra-axial fluid collection. Pituitary gland within normal limits. Vascular: Major intracranial vascular flow voids maintained. Skull and upper cervical spine: Craniocervical junction normal. Bone marrow signal intensity diffusely  decreased on T1 weighted imaging, most commonly related to anemia, smoking, or obesity. Scalp soft tissues unremarkable. Sinuses/Orbits: Globes and orbital soft tissues within normal limits. Mild scattered mucosal thickening throughout the paranasal sinuses. Superimposed small right maxillary sinus retention cyst. No air-fluid levels to suggest acute sinusitis. No mastoid effusion. Inner ear structures grossly normal. Other: None. MRA HEAD FINDINGS ANTERIOR CIRCULATION: Visualized distal cervical segments of the internal carotid arteries are widely patent with symmetric antegrade flow. Petrous, cavernous, and supraclinoid segments widely patent bilaterally. A1 segments, anterior communicating artery common anterior cerebral arteries widely patent to their distal aspects without stenosis. No M1 stenosis or occlusion. Normal MCA bifurcations. Distal MCA branches well perfused and symmetric. POSTERIOR CIRCULATION: Vertebral arteries widely patent to the vertebrobasilar junction without stenosis. Posterior inferior cerebral arteries patent bilaterally. Basilar widely patent to its distal aspect without stenosis. Superior cerebellar and posterior cerebral arteries well perfused and widely patent bilaterally. IMPRESSION: MRI HEAD IMPRESSION: 1. 1 cm acute ischemic nonhemorrhagic white matter infarct involving the posterior right corona radiata. 2. Additional acute to early subacute 6 mm nonhemorrhagic white matter infarct involving the overlying right centrum semi ovale. 3. Underlying moderate chronic microvascular ischemic disease with multiple remote lacunar infarcts involving the bilateral basal ganglia, thalami, and pons. 4. Multiple chronic micro hemorrhages scattered throughout the brain in a distribution most consistent with chronic poorly controlled hypertension. MRA HEAD IMPRESSION: Normal intracranial MRA. Electronically Signed   By: Jeannine Boga M.D.   On: 03/28/2018 18:17   Mr Brain Wo  Contrast  Result Date: 03/28/2018 CLINICAL DATA:  Initial evaluation for acute stroke. EXAM: MRI HEAD WITHOUT CONTRAST MRA HEAD WITHOUT CONTRAST TECHNIQUE: Multiplanar, multiecho pulse sequences of the brain and surrounding structures were obtained without intravenous contrast. Angiographic images of the head were obtained using MRA technique without contrast. COMPARISON:  Prior CT from earlier the same day. FINDINGS: MRI HEAD FINDINGS Brain: Cerebral volume within normal limits for age. Moderate chronic microvascular ischemic change present within the periventricular deep white matter both cerebral hemispheres. Multiple scattered superimposed remote lacunar infarcts seen involving the bilateral basal ganglia, thalami, and pons. 1 cm acute ischemic nonhemorrhagic infarct present at the posterior right corona radiata (series 3, image 36). Additional faint acute to early subacute 6 mm nonhemorrhagic infarct seen just superiorly within the right centrum semi ovale (series 3, image 40). No other evidence for acute or subacute ischemia. Gray-white matter differentiation otherwise maintained. No acute intracranial hemorrhage. Innumerable scattered chronic micro hemorrhages noted, most notable about the cerebellum, brainstem, and deep gray nuclei, compatible with chronic poorly controlled hypertension. No mass lesion, midline shift or mass effect. No hydrocephalus. No extra-axial fluid collection. Pituitary gland within normal limits. Vascular: Major intracranial vascular flow voids maintained. Skull and upper cervical spine: Craniocervical junction normal. Bone marrow signal intensity diffusely decreased on T1 weighted imaging, most commonly related to anemia, smoking, or obesity. Scalp soft tissues unremarkable. Sinuses/Orbits: Globes and orbital soft tissues within normal limits. Mild scattered mucosal thickening throughout the paranasal sinuses. Superimposed small right maxillary sinus retention cyst. No air-fluid  levels to suggest acute sinusitis. No mastoid effusion. Inner ear structures grossly normal. Other: None. MRA HEAD FINDINGS ANTERIOR  CIRCULATION: Visualized distal cervical segments of the internal carotid arteries are widely patent with symmetric antegrade flow. Petrous, cavernous, and supraclinoid segments widely patent bilaterally. A1 segments, anterior communicating artery common anterior cerebral arteries widely patent to their distal aspects without stenosis. No M1 stenosis or occlusion. Normal MCA bifurcations. Distal MCA branches well perfused and symmetric. POSTERIOR CIRCULATION: Vertebral arteries widely patent to the vertebrobasilar junction without stenosis. Posterior inferior cerebral arteries patent bilaterally. Basilar widely patent to its distal aspect without stenosis. Superior cerebellar and posterior cerebral arteries well perfused and widely patent bilaterally. IMPRESSION: MRI HEAD IMPRESSION: 1. 1 cm acute ischemic nonhemorrhagic white matter infarct involving the posterior right corona radiata. 2. Additional acute to early subacute 6 mm nonhemorrhagic white matter infarct involving the overlying right centrum semi ovale. 3. Underlying moderate chronic microvascular ischemic disease with multiple remote lacunar infarcts involving the bilateral basal ganglia, thalami, and pons. 4. Multiple chronic micro hemorrhages scattered throughout the brain in a distribution most consistent with chronic poorly controlled hypertension. MRA HEAD IMPRESSION: Normal intracranial MRA. Electronically Signed   By: Jeannine Boga M.D.   On: 03/28/2018 18:17    PHYSICAL EXAM Constitutional: Appears well-developed and well-nourished.  Psych: Affect appropriate to situation Eyes: No scleral injection HENT: No OP obstrucion Head: Normocephalic.  Cardiovascular: Normal rate and regular rhythm.    Neuro: Mental Status: Patient is awake, alert, oriented to person, place, month, year, and  situation. Patient is able to give a clear and coherent history. No signs of aphasia or neglect, slowed and slurred speech resolved Cranial Nerves: II: Visual Fields are full. Pupils are equal, round, and reactive to light.   III,IV, VI: EOMI without ptosis or diploplia.  V: Facial sensation is symmetric to light touch  VII: Facial movement is symmetric.  VIII: hearing is intact to voice X: Uvula elevates symmetrically XI: Shoulder shrug is symmetric. XII: tongue is midline without atrophy or fasciculations.  Motor: Tone is normal. Bulk is normal. 5/5 strength was present on the L hemibody, 4/5 on the L ( at baseline as per patient, currently unchanged)  Sensory: Sensation is symmetric to light touch and temperature in the arms and legs.  Plantars: Toes are downgoing bilaterally.  Cerebellar: FNF and HKS are intact bilaterally   ASSESSMENT/PLAN Ms. Belinda Ramirez is a 43 y.o. female with history of stroke in 2015 which left her with L sided hemiparesis and PRES in 2018, uncontrolled HTN/HLP  presenting with slowed and slurry speech.   Stroke:  Small R corona radiata and centrum semiovale infarcts  secondary to small vessel disease source in setting of uncontrolled HTN  CT head no acute stroke. Small vessel disease.     MRI  sm R CR infarct. sm R CSV infarct. Small vessel disease. Atrophy. Old B BG, thalami and pontine lacunes. Chronic microhemorrhages.  MRA  normal  Carotid Doppler  pending   2D Echo  EF >65%. No source of embolus   LDL 99  HgbA1c 6.2  Lovenox 40 mg sq daily for VTE prophylaxis  No antithrombotic prior to admission, now on aspirin 81 mg daily. Continue at d/c  Therapy recommendations:  HH PT, OT  Disposition:  pending  Ok for d/c from stroke standpoint once carotids completed  Follow-up Stroke Clinic at [facility] in 4 weeks. Office will call with appointment date and time. Order placed.  Hypertension . Elevated up to  207/110 . Consider secondary hypertensive workup - RAS, catecholamines, etc . Permissive hypertension (OK if < 220/120)  but gradually normalize in 5-7 days . Long-term BP goal normotensive  Hyperlipidemia  Home meds:  lipitor 80, resumed in hospital  LDL 99, goal < 70  Continue statin at discharge  Other Stroke Risk Factors  ETOH use, advised to drink no more than 1 drink(s) a day  Morbid Obesity, Body mass index is 48.06 kg/m., recommend weight loss, diet and exercise as appropriate   Hx stroke/TIA  2017 R BG d/t SVD  sickle cell trait  Other Active Problems  Hx PRES 2018  CKD stage III  Hospital day # 0  Burnetta Sabin, MSN, APRN, ANVP-BC, AGPCNP-BC Advanced Practice Stroke Nurse [place] for Schedule & Pager information 03/29/2018 1:33 PM   Speech and numbness resolved  Continue ASA and modify stroke risk factors as above BP is very poorly controlled, consider secondary HTN screening with renal artery [name] and Catecholamines urine screening Still needs Neck imagining, ordered above   To contact Stroke Continuity provider, please refer to [email or web address] After hours, contact General Neurology

## 2018-03-29 NOTE — Progress Notes (Signed)
OT Cancellation Note  Patient Details Name: Belinda Ramirez MRN: [id number] DOB: Feb 18, 1975   Cancelled Treatment:    Reason Eval/Treat Not Completed: Patient at procedure or test/ unavailable(Will return as schedule allows. Thank you.)  [place], OTR/L Acute Rehab Pager: [phone] Office: [phone] 03/29/2018, 9:44 AM

## 2018-03-30 DIAGNOSIS — R7309 Other abnormal glucose: Secondary | ICD-10-CM

## 2018-03-30 DIAGNOSIS — R350 Frequency of micturition: Secondary | ICD-10-CM

## 2018-03-30 DIAGNOSIS — R42 Dizziness and giddiness: Secondary | ICD-10-CM

## 2018-03-30 DIAGNOSIS — I161 Hypertensive emergency: Secondary | ICD-10-CM

## 2018-03-30 DIAGNOSIS — Z7982 Long term (current) use of aspirin: Secondary | ICD-10-CM

## 2018-03-30 DIAGNOSIS — R471 Dysarthria and anarthria: Secondary | ICD-10-CM

## 2018-03-30 DIAGNOSIS — I6381 Other cerebral infarction due to occlusion or stenosis of small artery: Principal | ICD-10-CM

## 2018-03-30 DIAGNOSIS — N186 End stage renal disease: Secondary | ICD-10-CM

## 2018-03-30 MED ORDER — AMLODIPINE BESYLATE 10 MG PO TABS
10.0000 mg | ORAL_TABLET | Freq: Every day | ORAL | Status: DC
  Administered 2018-03-30 – 2018-03-31 (×2): 10 mg via ORAL
  Filled 2018-03-30 (×2): qty 1

## 2018-03-30 MED ORDER — LISINOPRIL 20 MG PO TABS
20.0000 mg | ORAL_TABLET | Freq: Every day | ORAL | Status: DC
  Administered 2018-03-30 – 2018-03-31 (×2): 20 mg via ORAL
  Filled 2018-03-30 (×2): qty 1

## 2018-03-30 NOTE — Progress Notes (Signed)
Physical Therapy Treatment Patient Details Name: Belinda Ramirez MRN: [id number] DOB: Sep 17, 1975 Today's Date: 03/30/2018    History of Present Illness Pt is a 43 y/o female admitted secondary to slurred speech. MRI revealed 1 cm acute ischemic nonhemorrhagic white matter infarct involving the posterior right corona radiata. Additional acute to early subacute 6 mm nonhemorrhagic white matter infarct involving the overlying right centrum semi ovale. PMH including but not limited to HTN, sickle cell trait, prior cerebral hemorrhage and cerebral infarct.    PT Comments    Pt making progress towards her functional mobility goals. Limited this session secondary to elevated BP (supine = 187/98 mmHg and sitting = 204/104 mmHg); therefore, limited pt to ambulating to bathroom only as she needed to void. Pt's husband was present throughout and RN was notified at end of session. Pt would continue to benefit from skilled physical therapy services at this time while admitted and after d/c to address the below listed limitations in order to improve overall safety and independence with functional mobility.    Follow Up Recommendations  Home health PT     Equipment Recommendations  3in1 (PT)    Recommendations for Other Services       Precautions / Restrictions Precautions Precautions: Fall Precaution Comments: monitor BP Restrictions Weight Bearing Restrictions: No    Mobility  Bed Mobility Overal bed mobility: Needs Assistance Bed Mobility: Supine to Sit     Supine to sit: Supervision     General bed mobility comments: for safety  Transfers Overall transfer level: Needs assistance Equipment used: None Transfers: Sit to/from Stand Sit to Stand: Min guard         General transfer comment: min guard for safety,  Ambulation/Gait Ambulation/Gait assistance: Min guard Gait Distance (Feet): 20 Feet Assistive device: None Gait Pattern/deviations: Step-through pattern;Decreased  stride length Gait velocity: decreased   General Gait Details: pt with slow, cautious gait with mild instability, close min guard for safety; limited distance due to elevated BP pt needing to use bathroom   Stairs             Wheelchair Mobility    Modified Rankin (Stroke Patients Only) Modified Rankin (Stroke Patients Only) Pre-Morbid Rankin Score: Moderate disability Modified Rankin: Moderate disability     Balance Overall balance assessment: Needs assistance Sitting-balance support: Feet supported Sitting balance-Leahy Scale: Good     Standing balance support: No upper extremity supported Standing balance-Leahy Scale: Fair                              Cognition Arousal/Alertness: Awake/alert Behavior During Therapy: WFL for tasks assessed/performed;Flat affect Overall Cognitive Status: Within Functional Limits for tasks assessed                                        Exercises      General Comments        Pertinent Vitals/Pain Pain Assessment: No/denies pain    Home Living                      Prior Function            PT Goals (current goals can now be found in the care plan section) Acute Rehab PT Goals PT Goal Formulation: With patient Time For Goal Achievement: 04/12/18 Potential to Achieve Goals: Good Progress towards PT  goals: Progressing toward goals    Frequency    Min 4X/week      PT Plan Current plan remains appropriate    Co-evaluation              AM-PAC PT "6 Clicks" Mobility   Outcome Measure  Help needed turning from your back to your side while in a flat bed without using bedrails?: None Help needed moving from lying on your back to sitting on the side of a flat bed without using bedrails?: None Help needed moving to and from a bed to a chair (including a wheelchair)?: None Help needed standing up from a chair using your arms (e.g., wheelchair or bedside chair)?: None Help  needed to walk in hospital room?: A Little Help needed climbing 3-5 steps with a railing? : A Little 6 Click Score: 22    End of Session   Activity Tolerance: Patient tolerated treatment well Patient left: with call bell/phone within reach;with family/visitor present;Other (comment)(seated on the toilet) Nurse Communication: Mobility status;Other (comment)(elevated BP) PT Visit Diagnosis: Other abnormalities of gait and mobility (R26.89)     Time: 8325-4982 PT Time Calculation (min) (ACUTE ONLY): 16 min  Charges:  $Therapeutic Activity: 8-22 mins                     Sherie Don, PT, DPT  Acute Rehabilitation Services Pager [phone] Office [place] 03/30/2018, 10:23 AM

## 2018-03-30 NOTE — Progress Notes (Signed)
   Subjective:  This morning Ms. Strehle reports that she feels dizzy when she gets out of bed and walks to the bathroom.  The dizziness improves when she returns to bed and rests for a few minutes.  She denies headache, changes to vision, weakness, or chest pain during these episodes.  She also continues to urinate more frequently than usual since coming to the hospital, but denies dysuria or urgency.  We discussed discharge planning today, including importance of taking her anti-hypertensive medications regularly when she returns home.  She would like to wait until after lunch to see if her dizziness improves before she is comfortable with discharge.  Objective:  Vital signs in last 24 hours: Vitals:   03/29/18 2033 03/29/18 2346 03/30/18 0400 03/30/18 1049  BP: (!) 169/88 (!) 199/91 (!) 163/86 (!) 153/71  Pulse: 74 77 70   Resp: 17 15 19    Temp: 98.1 F (36.7 C) 98.5 F (36.9 C) 97.9 F (36.6 C)   TempSrc: Oral Oral Oral   SpO2: 97% 100% 99%   Weight:      Height:       Gen: pleasant, well-appearing, sitting up in bed without distress. Pulm: breathing comfortably on room air, lungs clear bilaterally. CV: regular rate and rhythm without murmurs. Ext: 4x distal pulses palpable and symmetric, no lower extremity swelling. Neuro: alert and oriented, left-sided weakness in face, upper and lower extremities is resolved.  No dysarthria.  Assessment/Plan:  Belinda Ramirez is a 43 year old woman with a history of hemorrhagic CVA in 2017, hypertension, hyperlipidemia, and CKDIII who presents with 1 day of slurred speech, left hemiplegia, and headache that occurred after she took her first dose of newly-prescribed clonidine.  MRI head shows acute nonhemorrhagic infarcts in corona radiata and centrum semiovale.  Ischemic stroke: MRI with 2x acute nonhemorrhagic infarcts.  No embolic source on echocardiogram or carotid ultrasound. Mild ICA stenosis noted on carotid doppler. Left hemiplegia and  dysarthria have resolved on the third day of hospitalization and neurology has signed off with plan to see outpatient in 4 weeks. - Resume home amlodipine and lisinopril. Consider adding spironolactone on f/u with PCP. - Hydralazine PRN for SBP > 185 - Aspirin 81 mg daily - Telemetry - Follow up with neurology in 4 weeks - Ready for discharge today  Symptomatic hypertension: Workup for secondary hypertension including renal artery [name] in 2017 and urine metanephrines in 2018 negative. Continues to be severely hypertensive on home medications although not taking them reliably.  Will discontinue clonidine as this can cause rebound hypertension after missed dose.  Says she has taken chlorthalidone previously with good BP control.  Pressures today have been fluctuating and may explain her dizziness. - Resume home amlodipine and lisinopril - Discontinue home clonidine  CKD III: creatinine on admission 1.5, consistent with baseline.    Elevated A1c: A1c is 6.2%, she will need outpatient f/u with her PCP to optimize blood sugars.  Hyperlipidemia: LDL 99 with target < 70.  Continue home atorvastatin 80 mg.  FEN: holding IV fluids, replete electrolytes PRN, regular diet VTE Prophylaxis: enoxaparin Code status: full code  Dispo: Anticipated discharge today or tomorrow.  Gypsy Decant, Medical Student 03/30/2018, 11:24 AM   Attestation for Student Documentation:  I personally was present and performed or re-performed the history, physical exam and medical decision-making activities of this service and have verified that the service and findings are accurately documented in the student's note.  Zivah Mayr, Andree Elk, MD 03/30/2018, 1:44 PM

## 2018-03-30 NOTE — Progress Notes (Signed)
STROKE TEAM PROGRESS NOTE   INTERVAL HISTORY Patient is no distress. BP better controlled and stated she felt slightly lightheaded, most likely from finally normalizing her BP and her normally living in a much higher range. It was explained to her that her body will have to adjust to her BP being lower as for years it is used to functioning at a much higher range  Vitals:   03/29/18 1724 03/29/18 2033 03/29/18 2346 03/30/18 0400  BP: (!) 187/72 (!) 169/88 (!) 199/91 (!) 163/86  Pulse:  74 77 70  Resp:  17 15 19   Temp:  98.1 F (36.7 C) 98.5 F (36.9 C) 97.9 F (36.6 C)  TempSrc:  Oral Oral Oral  SpO2:  97% 100% 99%  Weight:      Height:        CBC:  Recent Labs  Lab 03/28/18 1014  WBC 6.8  NEUTROABS 4.2  HGB 11.7*  HCT 37.0  MCV 76.8*  PLT 536    Basic Metabolic Panel:  Recent Labs  Lab 03/28/18 1014  NA 140  K 3.9  CL 102  CO2 27  GLUCOSE 114*  BUN 9  CREATININE 1.53*  CALCIUM 9.1   Lipid Panel:     Component Value Date/Time   CHOL 170 03/29/2018 0505   TRIG 56 03/29/2018 0505   HDL 60 03/29/2018 0505   CHOLHDL 2.8 03/29/2018 0505   VLDL 11 03/29/2018 0505   LDLCALC 99 03/29/2018 0505   HgbA1c:  Lab Results  Component Value Date   HGBA1C 6.2 (H) 03/29/2018   Urine Drug Screen:     Component Value Date/Time   LABOPIA NONE DETECTED 02/05/2018 0141   COCAINSCRNUR NONE DETECTED 02/05/2018 0141   LABBENZ NONE DETECTED 02/05/2018 0141   AMPHETMU NONE DETECTED 02/05/2018 0141   THCU NONE DETECTED 02/05/2018 0141   LABBARB NONE DETECTED 02/05/2018 0141    Alcohol Level No results found for: [facility]  IMAGING Ct Head Wo Contrast  Result Date: 03/28/2018 CLINICAL DATA:  Headache. EXAM: CT HEAD WITHOUT CONTRAST TECHNIQUE: Contiguous axial images were obtained from the base of the skull through the vertex without intravenous contrast. COMPARISON:  CT scan and MRI scan of February 04, 2018. FINDINGS: Brain: Mild chronic ischemic white matter disease is noted.  No mass effect or midline shift is noted. Ventricular size is within normal limits. There is no evidence of mass lesion, hemorrhage or acute infarction. Vascular: No hyperdense vessel or unexpected calcification. Skull: Normal. Negative for fracture or focal lesion. Sinuses/Orbits: Right maxillary mucous retention cyst is noted. Mild left maxillary sinusitis is noted. Other: None. IMPRESSION: Mild chronic ischemic white matter disease. No acute intracranial abnormality seen. Electronically Signed   By: Marijo Conception, M.D.   On: 03/28/2018 11:44   Mr Jodene Nam Head Wo Contrast  Result Date: 03/28/2018 CLINICAL DATA:  Initial evaluation for acute stroke. EXAM: MRI HEAD WITHOUT CONTRAST MRA HEAD WITHOUT CONTRAST TECHNIQUE: Multiplanar, multiecho pulse sequences of the brain and surrounding structures were obtained without intravenous contrast. Angiographic images of the head were obtained using MRA technique without contrast. COMPARISON:  Prior CT from earlier the same day. FINDINGS: MRI HEAD FINDINGS Brain: Cerebral volume within normal limits for age. Moderate chronic microvascular ischemic change present within the periventricular deep white matter both cerebral hemispheres. Multiple scattered superimposed remote lacunar infarcts seen involving the bilateral basal ganglia, thalami, and pons. 1 cm acute ischemic nonhemorrhagic infarct present at the posterior right corona radiata (series 3, image 36). Additional  faint acute to early subacute 6 mm nonhemorrhagic infarct seen just superiorly within the right centrum semi ovale (series 3, image 40). No other evidence for acute or subacute ischemia. Gray-white matter differentiation otherwise maintained. No acute intracranial hemorrhage. Innumerable scattered chronic micro hemorrhages noted, most notable about the cerebellum, brainstem, and deep gray nuclei, compatible with chronic poorly controlled hypertension. No mass lesion, midline shift or mass effect. No  hydrocephalus. No extra-axial fluid collection. Pituitary gland within normal limits. Vascular: Major intracranial vascular flow voids maintained. Skull and upper cervical spine: Craniocervical junction normal. Bone marrow signal intensity diffusely decreased on T1 weighted imaging, most commonly related to anemia, smoking, or obesity. Scalp soft tissues unremarkable. Sinuses/Orbits: Globes and orbital soft tissues within normal limits. Mild scattered mucosal thickening throughout the paranasal sinuses. Superimposed small right maxillary sinus retention cyst. No air-fluid levels to suggest acute sinusitis. No mastoid effusion. Inner ear structures grossly normal. Other: None. MRA HEAD FINDINGS ANTERIOR CIRCULATION: Visualized distal cervical segments of the internal carotid arteries are widely patent with symmetric antegrade flow. Petrous, cavernous, and supraclinoid segments widely patent bilaterally. A1 segments, anterior communicating artery common anterior cerebral arteries widely patent to their distal aspects without stenosis. No M1 stenosis or occlusion. Normal MCA bifurcations. Distal MCA branches well perfused and symmetric. POSTERIOR CIRCULATION: Vertebral arteries widely patent to the vertebrobasilar junction without stenosis. Posterior inferior cerebral arteries patent bilaterally. Basilar widely patent to its distal aspect without stenosis. Superior cerebellar and posterior cerebral arteries well perfused and widely patent bilaterally. IMPRESSION: MRI HEAD IMPRESSION: 1. 1 cm acute ischemic nonhemorrhagic white matter infarct involving the posterior right corona radiata. 2. Additional acute to early subacute 6 mm nonhemorrhagic white matter infarct involving the overlying right centrum semi ovale. 3. Underlying moderate chronic microvascular ischemic disease with multiple remote lacunar infarcts involving the bilateral basal ganglia, thalami, and pons. 4. Multiple chronic micro hemorrhages scattered  throughout the brain in a distribution most consistent with chronic poorly controlled hypertension. MRA HEAD IMPRESSION: Normal intracranial MRA. Electronically Signed   By: Jeannine Boga M.D.   On: 03/28/2018 18:17   Mr Brain Wo Contrast  Result Date: 03/28/2018 CLINICAL DATA:  Initial evaluation for acute stroke. EXAM: MRI HEAD WITHOUT CONTRAST MRA HEAD WITHOUT CONTRAST TECHNIQUE: Multiplanar, multiecho pulse sequences of the brain and surrounding structures were obtained without intravenous contrast. Angiographic images of the head were obtained using MRA technique without contrast. COMPARISON:  Prior CT from earlier the same day. FINDINGS: MRI HEAD FINDINGS Brain: Cerebral volume within normal limits for age. Moderate chronic microvascular ischemic change present within the periventricular deep white matter both cerebral hemispheres. Multiple scattered superimposed remote lacunar infarcts seen involving the bilateral basal ganglia, thalami, and pons. 1 cm acute ischemic nonhemorrhagic infarct present at the posterior right corona radiata (series 3, image 36). Additional faint acute to early subacute 6 mm nonhemorrhagic infarct seen just superiorly within the right centrum semi ovale (series 3, image 40). No other evidence for acute or subacute ischemia. Gray-white matter differentiation otherwise maintained. No acute intracranial hemorrhage. Innumerable scattered chronic micro hemorrhages noted, most notable about the cerebellum, brainstem, and deep gray nuclei, compatible with chronic poorly controlled hypertension. No mass lesion, midline shift or mass effect. No hydrocephalus. No extra-axial fluid collection. Pituitary gland within normal limits. Vascular: Major intracranial vascular flow voids maintained. Skull and upper cervical spine: Craniocervical junction normal. Bone marrow signal intensity diffusely decreased on T1 weighted imaging, most commonly related to anemia, smoking, or obesity.  Scalp soft tissues unremarkable. Sinuses/Orbits:  Globes and orbital soft tissues within normal limits. Mild scattered mucosal thickening throughout the paranasal sinuses. Superimposed small right maxillary sinus retention cyst. No air-fluid levels to suggest acute sinusitis. No mastoid effusion. Inner ear structures grossly normal. Other: None. MRA HEAD FINDINGS ANTERIOR CIRCULATION: Visualized distal cervical segments of the internal carotid arteries are widely patent with symmetric antegrade flow. Petrous, cavernous, and supraclinoid segments widely patent bilaterally. A1 segments, anterior communicating artery common anterior cerebral arteries widely patent to their distal aspects without stenosis. No M1 stenosis or occlusion. Normal MCA bifurcations. Distal MCA branches well perfused and symmetric. POSTERIOR CIRCULATION: Vertebral arteries widely patent to the vertebrobasilar junction without stenosis. Posterior inferior cerebral arteries patent bilaterally. Basilar widely patent to its distal aspect without stenosis. Superior cerebellar and posterior cerebral arteries well perfused and widely patent bilaterally. IMPRESSION: MRI HEAD IMPRESSION: 1. 1 cm acute ischemic nonhemorrhagic white matter infarct involving the posterior right corona radiata. 2. Additional acute to early subacute 6 mm nonhemorrhagic white matter infarct involving the overlying right centrum semi ovale. 3. Underlying moderate chronic microvascular ischemic disease with multiple remote lacunar infarcts involving the bilateral basal ganglia, thalami, and pons. 4. Multiple chronic micro hemorrhages scattered throughout the brain in a distribution most consistent with chronic poorly controlled hypertension. MRA HEAD IMPRESSION: Normal intracranial MRA. Electronically Signed   By: Jeannine Boga M.D.   On: 03/28/2018 18:17   Vas US Carotid  Result Date: 03/29/2018 Carotid Arterial Duplex Study Indications:  CVA. Risk Factors:  Hypertension, prior CVA. Performing Technologist: Abram Sander RVS  Examination Guidelines: A complete evaluation includes B-mode imaging, spectral Doppler, color Doppler, and power Doppler as needed of all accessible portions of each vessel. Bilateral testing is considered an integral part of a complete examination. Limited examinations for reoccurring indications may be performed as noted.  Right Carotid Findings: +----------+--------+--------+--------+-----------+--------+           PSV cm/sEDV cm/sStenosisDescribe   Comments +----------+--------+--------+--------+-----------+--------+ CCA Prox  86      11              homogeneous         +----------+--------+--------+--------+-----------+--------+ CCA Distal60      12              homogeneous         +----------+--------+--------+--------+-----------+--------+ ICA Prox  47      13      1-39%   homogeneous         +----------+--------+--------+--------+-----------+--------+ ICA Distal84      22                                  +----------+--------+--------+--------+-----------+--------+ ECA       67      11                                  +----------+--------+--------+--------+-----------+--------+ +----------+--------+-------+--------+-------------------+           PSV cm/sEDV cmsDescribeArm Pressure (mmHG) +----------+--------+-------+--------+-------------------+ [id number]                                         +----------+--------+-------+--------+-------------------+ +---------+--------+--+--------+--+---------+ VertebralPSV cm/s40EDV cm/s10Antegrade +---------+--------+--+--------+--+---------+  Left Carotid Findings: +----------+--------+--------+--------+-----------+--------+           PSV cm/sEDV cm/sStenosisDescribe   Comments +----------+--------+--------+--------+-----------+--------+  CCA Prox  131     19              homogeneous          +----------+--------+--------+--------+-----------+--------+ CCA Distal77      14              homogeneous         +----------+--------+--------+--------+-----------+--------+ ICA Prox  51      16      1-39%   homogeneous         +----------+--------+--------+--------+-----------+--------+ ICA Distal105     35                                  +----------+--------+--------+--------+-----------+--------+ ECA       98      13                                  +----------+--------+--------+--------+-----------+--------+ +----------+--------+--------+--------+-------------------+ SubclavianPSV cm/sEDV cm/sDescribeArm Pressure (mmHG) +----------+--------+--------+--------+-------------------+           119                                         +----------+--------+--------+--------+-------------------+ +---------+--------+--+--------+-+---------+ VertebralPSV cm/s36EDV cm/s8Antegrade +---------+--------+--+--------+-+---------+  Summary: Right Carotid: Velocities in the right ICA are consistent with a 1-39% stenosis. Left Carotid: Velocities in the left ICA are consistent with a 1-39% stenosis. Vertebrals: Bilateral vertebral arteries demonstrate antegrade flow. *See table(s) above for measurements and observations.  Electronically signed by Antony Contras MD on 03/29/2018 at 4:08:34 PM.    Final     PHYSICAL EXAM Constitutional: Appears well-developed and well-nourished.  Psych: Affect appropriate to situation Eyes: No scleral injection HENT: No OP obstrucion Head: Normocephalic.  Cardiovascular: Normal rate and regular rhythm.   Neuro: Mental Status: Patient is awake, alert, oriented to person, place, month, year, and situation. Patient is able to give a clear and coherent history. No signs of aphasia or neglect, slowed and slurred speech resolved Cranial Nerves: II: Visual Fields are full. Pupils are equal, round, and reactive to light.   III,IV, VI: EOMI  without ptosis or diploplia.  V: Facial sensation is symmetric to light touch  VII: Facial movement is symmetric.  VIII: hearing is intact to voice X: Uvula elevates symmetrically XI: Shoulder shrug is symmetric. XII: tongue is midline without atrophy or fasciculations.  Motor: Tone is normal. Bulk is normal. 5/5 strength was present on the L hemibody, 4/5 on the L ( at baseline as per patient, currently unchanged)  Sensory: Sensation is symmetric to light touch and temperature in the arms and legs. Plantars: Toes are downgoing bilaterally.  Cerebellar: FNF and HKS are intact bilaterally   ASSESSMENT/PLAN Ms. Belinda Ramirez is a 43 y.o. female with history of stroke in 2015 which left her with L sided hemiparesis and PRES in 2018, uncontrolled HTN/HLP  presenting with slowed and slurry speech.   Stroke:  Small R corona radiata and centrum semiovale infarcts  secondary to small vessel disease source in setting of uncontrolled HTN  CT head no acute stroke. Small vessel disease.     MRI  sm R CR infarct. sm R CSV infarct. Small vessel disease. Atrophy. Old B BG, thalami and pontine lacunes. Chronic microhemorrhages.  MRA  normal  Carotid Doppler  B ICA 1-39% stenosis, VAs antegrade   2D Echo  EF >65%. No source of embolus   LDL 99  HgbA1c 6.2  Lovenox 40 mg sq daily for VTE prophylaxis  No antithrombotic prior to admission, now on aspirin 81 mg daily. Continue at d/c  Therapy recommendations:  HH PT, OT  Disposition:  pending  [place] [facility] [place] for d/c from stroke standpoint  Follow-up Stroke Clinic at [facility] in 4 weeks. Office will call with appointment date and time.   Hypertension . Elevated up to 207/110 . Consider secondary hypertensive workup - RAS, catecholamines, etc . SBP goal now < 160 . Long-term BP goal normotensive  Hyperlipidemia  Home meds:  lipitor 80, resumed in hospital  LDL 99, goal < 70  Continue statin at discharge  Other  Stroke Risk Factors  ETOH use, advised to drink no more than 1 drink(s) a day  Morbid Obesity, Body mass index is 48.06 kg/m., recommend weight loss, diet and exercise as appropriate   Hx stroke/TIA  2017 R BG d/t SVD  sickle cell trait  Other Active Problems  Hx PRES 2018  CKD stage III  Hospital day # 1  Speech and numbness resolved  Continue ASA and modify stroke risk factors as above Carotid US- WNL BP management as per primary team Neuro workup complete Please call back with any questions    To contact Stroke Continuity provider, please refer to [email or web address] After hours, contact General Neurology

## 2018-03-30 NOTE — Plan of Care (Signed)
Nutrition Education Note  RD consulted for nutrition education regarding hypertension.  RD provided "Low Sodium Nutrition Therapy" handout from the Academy of [employer]. Reviewed patient's dietary recall. Provided examples on ways to decrease sodium intake in diet. Discouraged intake of processed foods and use of salt shaker. Encouraged fresh fruits and vegetables as well as whole grain sources of carbohydrates to maximize fiber intake.Teach back method used.  Expect good compliance.  Body mass index is 48.06 kg/m. Pt meets criteria for morbid obesity based on current BMI.  Current diet order is regular. Pt reports having a good appetite currently and PTA with no other difficulties. Labs and medications reviewed. No further nutrition interventions warranted at this time. RD contact information provided. If additional nutrition issues arise, please re-consult RD.   Corrin Parker, MS, RD, LDN Pager # [phone] After hours/ weekend pager # [phone]

## 2018-03-31 MED ORDER — ASPIRIN 81 MG PO TBEC: 81.0000 mg | DELAYED_RELEASE_TABLET | Freq: Every day | ORAL | 1 refills | Status: DC

## 2018-03-31 NOTE — Progress Notes (Signed)
Physical Therapy Treatment Patient Details Name: Belinda Ramirez MRN: [id number] DOB: 01/20/1976 Today's Date: 03/31/2018    History of Present Illness Pt is a 43 y/o female admitted secondary to slurred speech. MRI revealed 1 cm acute ischemic nonhemorrhagic white matter infarct involving the posterior right corona radiata. Additional acute to early subacute 6 mm nonhemorrhagic white matter infarct involving the overlying right centrum semi ovale. PMH including but not limited to HTN, sickle cell trait, prior cerebral hemorrhage and cerebral infarct.    PT Comments    Pt making steady progress with functional mobility and tolerated ambulating a further distance this session. However, she remains limited secondary to fatigue and ambulates at an incredibly slow pace. Pt would continue to benefit from skilled physical therapy services at this time while admitted and after d/c to address the below listed limitations in order to improve overall safety and independence with functional mobility.  All VSS throughout.    Follow Up Recommendations  Home health PT     Equipment Recommendations  3in1 (PT)    Recommendations for Other Services       Precautions / Restrictions Precautions Precautions: Fall Precaution Comments: monitor BP Restrictions Weight Bearing Restrictions: No    Mobility  Bed Mobility Overal bed mobility: Needs Assistance Bed Mobility: Supine to Sit;Sit to Supine     Supine to sit: Supervision Sit to supine: Supervision   General bed mobility comments: for safety  Transfers Overall transfer level: Needs assistance Equipment used: Straight cane Transfers: Sit to/from Stand Sit to Stand: Supervision         General transfer comment: for safety  Ambulation/Gait Ambulation/Gait assistance: Min guard Gait Distance (Feet): 75 Feet Assistive device: Straight cane Gait Pattern/deviations: Step-to pattern;Step-through pattern;Decreased step length -  right;Decreased step length - left;Decreased stride length Gait velocity: greatly decreased   General Gait Details: pt with very slow, cautious gait, no instability or LOB, min guard for safety   Stairs             Wheelchair Mobility    Modified Rankin (Stroke Patients Only) Modified Rankin (Stroke Patients Only) Pre-Morbid Rankin Score: Moderate disability Modified Rankin: Moderate disability     Balance Overall balance assessment: Needs assistance Sitting-balance support: Feet supported Sitting balance-Leahy Scale: Good     Standing balance support: No upper extremity supported Standing balance-Leahy Scale: Fair                              Cognition Arousal/Alertness: Awake/alert Behavior During Therapy: WFL for tasks assessed/performed;Flat affect Overall Cognitive Status: Within Functional Limits for tasks assessed                                        Exercises      General Comments        Pertinent Vitals/Pain Pain Assessment: No/denies pain    Home Living                      Prior Function            PT Goals (current goals can now be found in the care plan section) Acute Rehab PT Goals PT Goal Formulation: With patient Time For Goal Achievement: 04/12/18 Potential to Achieve Goals: Good Progress towards PT goals: Progressing toward goals    Frequency    Min 4X/week  PT Plan Current plan remains appropriate    Co-evaluation              AM-PAC PT "6 Clicks" Mobility   Outcome Measure  Help needed turning from your back to your side while in a flat bed without using bedrails?: None Help needed moving from lying on your back to sitting on the side of a flat bed without using bedrails?: None Help needed moving to and from a bed to a chair (including a wheelchair)?: None Help needed standing up from a chair using your arms (e.g., wheelchair or bedside chair)?: None Help needed to  walk in hospital room?: A Little Help needed climbing 3-5 steps with a railing? : A Little 6 Click Score: 22    End of Session   Activity Tolerance: Patient tolerated treatment well Patient left: in bed;with call bell/phone within reach;with bed alarm set Nurse Communication: Mobility status PT Visit Diagnosis: Other abnormalities of gait and mobility (R26.89)     Time: 4461-9012 PT Time Calculation (min) (ACUTE ONLY): 30 min  Charges:  $Gait Training: 8-22 mins $Therapeutic Activity: 8-22 mins                     Sherie Don, PT, DPT  Acute Rehabilitation Services Pager [phone] Office [place] 03/31/2018, 1:35 PM

## 2018-03-31 NOTE — Progress Notes (Signed)
   Subjective:  This morning Belinda Ramirez is very pleasant.  She says she feels great and notes that her dizziness is completely resolved.  We addressed her questions about which medications to take at home and how she will continue physical therapy with home health.  She is requesting to be discharged today and primary team agrees that is appropriate.  Objective:  Vital signs in last 24 hours: Vitals:   03/30/18 2009 03/30/18 2340 03/31/18 0405 03/31/18 0800  BP: 139/72 (!) 183/86 (!) 154/83 (!) 141/79  Pulse: 82 77 67 65  Resp: 17 16 17 19   Temp: 98.6 F (37 C) 98.6 F (37 C) 97.6 F (36.4 C) 97.9 F (36.6 C)  TempSrc: Oral Oral Oral Oral  SpO2: 91% 100% 93% 94%  Weight:      Height:       Gen: pleasant, well-appearing, sitting up in bed without distress. Pulm: breathing comfortably on room air, lungs clear bilaterally. CV: regular rate and rhythm without murmurs. Ext: 4x distal pulses palpable and symmetric, no lower extremity swelling. Neuro: alert and oriented, no dysarthria, smile is symmetric, left upper and lower extremities slightly weaker than right.  Assessment/Plan:  Belinda Ramirez is a 43 year old woman with a history of hemorrhagic CVA in 2017, hypertension, hyperlipidemia, and CKDIII who presents with 1 day of slurred speech, left hemiplegia, and headache that occurred after she took her first dose of newly-prescribed clonidine.  MRI head shows acute nonhemorrhagic infarcts in corona radiata and centrum semiovale.  Ischemic stroke: MRI with 2x acute nonhemorrhagic infarcts.  No embolic source on echocardiogram or carotid ultrasound. Mild ICA stenosis noted on carotid doppler. Left hemiplegia is at baseline and dysarthria is resolved on the fourth day of hospitalization.  Neurology has signed off with plan to see outpatient in 4 weeks. - Continue home amlodipine and lisinopril. - Aspirin 81 mg daily - Follow up with neurology in 4 weeks - Ready for discharge  today  Symptomatic hypertension: Workup for secondary hypertension including renal artery [name] in 2017 and urine metanephrines in 2018 negative. She understands not to take clonidine as this can cause rebound hypertension after missed dose.  Resumed home anti-hypertensive medications yesterday and blood pressures were down to 140s, will continue to slowly normalize her BP.  Consider adding spironolactone at later follow up with PCP. - Continue home amlodipine and lisinopril  CKD III: creatinine on admission 1.5, consistent with baseline.    Elevated A1c: A1c is 6.2%, she will need outpatient f/u with her PCP to optimize blood sugars.  Hyperlipidemia: LDL 99 with target < 70.  Continue home atorvastatin 80 mg.  FEN: holding IV fluids, replete electrolytes PRN, regular diet VTE Prophylaxis: enoxaparin Code status: full code  Dispo: Anticipated discharge today or tomorrow.  Gypsy Decant, Medical Student 03/31/2018, 2:22 PM   Attestation for Student Documentation:  I personally was present and performed or re-performed the history, physical exam and medical decision-making activities of this service and have verified that the service and findings are accurately documented in the student's note.  , Andree Elk, MD 03/31/2018, 2:33 PM

## 2018-03-31 NOTE — Discharge Summary (Signed)
Name: Belinda Ramirez MRN: [id number] DOB: 08-28-1975 43 y.o. PCP: Gildardo Pounds, NP  Date of Admission: 03/28/2018 10:07 AM Date of Discharge: 03/31/2018 Attending Physician: Dr. Daryll Drown  Discharge Diagnosis: 1. Ischemic CVA 2. HTN 3. CKDIII 4. Elevated A1c 5. HLD  Discharge Medications: Allergies as of 03/31/2018      Reactions   Oxycodone Hives   Percocet [oxycodone-acetaminophen] Hives   Amoxicillin Hives   Has patient had a PCN reaction causing immediate rash, facial/tongue/throat swelling, SOB or lightheadedness with hypotension: No Has patient had a PCN reaction causing severe rash involving mucus membranes or skin necrosis: No Has patient had a PCN reaction that required hospitalization: No Has patient had a PCN reaction occurring within the last 10 years: Yes If all of the above answers are "NO", then may proceed with Cephalosporin use.   Aspirin Other (See Comments)   Hx of hemorrhagic stroke (pt does take low dose aspirin )   Latex Itching      Medication List    STOP taking these medications   cloNIDine 0.1 MG tablet Commonly known as:  CATAPRES     TAKE these medications   acetaminophen 500 MG tablet Commonly known as:  TYLENOL Take 2 tablets (1,000 mg total) by mouth every 6 (six) hours as needed. What changed:  reasons to take this   albuterol 108 (90 Base) MCG/ACT inhaler Commonly known as:  PROVENTIL HFA;VENTOLIN HFA Inhale 2 puffs into the lungs every 6 (six) hours as needed for wheezing or shortness of breath.   amLODipine 10 MG tablet Commonly known as:  NORVASC Take 1 tablet (10 mg total) by mouth daily.   aspirin 81 MG EC tablet Take 1 tablet (81 mg total) by mouth daily. Start taking on:  April 01, 2018   atorvastatin 80 MG tablet Commonly known as:  LIPITOR Take 1 tablet (80 mg total) by mouth daily at 6 PM.   ferrous sulfate 325 (65 FE) MG EC tablet Take 1 tablet (325 mg total) by mouth 2 (two) times daily.   fluticasone  50 MCG/ACT nasal spray Commonly known as:  FLONASE Place 2 sprays into both nostrils daily.   lisinopril 20 MG tablet Commonly known as:  PRINIVIL,ZESTRIL Take 1 tablet (20 mg total) by mouth daily.   SUMAtriptan 100 MG tablet Commonly known as:  IMITREX Take 0.5 tablets (50 mg total) by mouth daily as needed for migraine. May repeat in 2 hours if headache persists or recurs.   vitamin B-12 1000 MCG tablet Commonly known as:  CYANOCOBALAMIN Take 1 tablet (1,000 mcg total) by mouth daily.       Disposition and follow-up:   Ms.Fianna Biswell was discharged from [facility] in Good condition.  At the hospital follow up visit please address:  1. Ischemic CVA - Please ensure patient has appropriate f/u with neurology  HTN - Patient resumed on home lisinopril 20mg  and amlodipine 10mg  at discharge. - Clonidine was discontinued. - Please monitor BP in the outpatient setting and continue to titrate medications. - If hypertensive at next f/u, consider increasing lisinopril dose or adding on spironolactone  Elevated A1c - A1c was 6.2.  - Please follow patient for glycemic control  2.  Labs / imaging needed at time of follow-up: none  3.  Pending labs/ test needing follow-up: none  Follow-up Appointments: Follow-up Information    [facility] Follow up in 4 week(s).   Specialty:  Neurology Why:  stroke clinic. office will call  with appt date and time Contact information: [address] [place] by problem list: 1. Ischemic CVA: Belinda Ramirez is a 43 year old woman with a history of hemorrhagic CVA in 2017,PRES, hypertension, hyperlipidemia, and CKDIIIwho presented with 1 day of slurred speech, left hemiplegia, and headache that occurred after she took her first dose of newly-prescribed clonidine.MRI head showed acute nonhemorrhagic infarcts in corona radiata  and centrum semiovale as well as diffuse microhemorrhages consistent with uncontrolled HTN. ECHO and carotid dopplers were wnl. She was started on a daily aspirin 81mg . Her statin was continued. All of her symptoms resolved. She continues to have mild right-sided weakness that has been present since her 2017 stroke. She will f/u with neurology as an outpatient. She was discharged home with home PT.   2. HTN: She was hypertensive into the 409W systolic. Her blood pressure medications were held for 48 hours post stroke for permissive htn. Pressures with sbp > 185 were treated with prn hydralazine. Upon discharge, her home medications of lisinopril 20mg  daily and amlodipine 10mg  daily were resumed with improvement of her BP into the 119J systolic. Her home clonidine was discontinued. She should f/u with her PCP for continued monitoring and titration of her BP meds.   3. CKDIII: Cr was 1.5 on admission, consistent with baseline.   4. Elevated A1c: A1c is 6.2%, she will need outpatient f/u with her PCP to optimize blood sugars.  5. HLD: LDL 99 with target < 70.  Home atorvastatin 80 mg was continued.  Discharge Vitals:   BP (!) 141/79 (BP Location: Right Arm)   Pulse 65   Temp 97.9 F (36.6 C) (Oral)   Resp 19   Ht 5\' 4"  (1.626 m)   Wt 127 kg   SpO2 94%   BMI 48.06 kg/m   Pertinent Labs, Studies, and Procedures:  CBC Latest Ref Rng & Units 03/28/2018 02/05/2018 02/04/2018  WBC 4.0 - 10.5 K/uL 6.8 8.6 -  Hemoglobin 12.0 - 15.0 g/dL 11.7(L) 12.0 13.6  Hematocrit 36.0 - 46.0 % 37.0 38.1 40.0  Platelets 150 - 400 K/uL 352 314 -   CMP Latest Ref Rng & Units 03/28/2018 02/24/2018 02/05/2018  Glucose 70 - 99 mg/dL 114(H) 99 103(H)  BUN 6 - 20 mg/dL 9 24 13   Creatinine 0.44 - 1.00 mg/dL 1.53(H) 1.86(H) 2.08(H)  Sodium 135 - 145 mmol/L 140 140 139  Potassium 3.5 - 5.1 mmol/L 3.9 4.7 3.5  Chloride 98 - 111 mmol/L 102 102 103  CO2 22 - 32 mmol/L 27 16(L) 24  Calcium 8.9 - 10.3 mg/dL 9.1 9.6 8.7(L)    Total Protein 6.5 - 8.1 g/dL 7.4 8.1 -  Total Bilirubin 0.3 - 1.2 mg/dL 0.8 0.5 -  Alkaline Phos 38 - 126 U/L 70 85 -  AST 15 - 41 U/L 12(L) 18 -  ALT 0 - 44 U/L 9 8 -   Brain MRI 03/28/2018 1. 1 cm acute ischemic nonhemorrhagic white matter infarct involving the posterior right corona radiata. 2. Additional acute to early subacute 6 mm nonhemorrhagic white matter infarct involving the overlying right centrum semi ovale. 3. Underlying moderate chronic microvascular ischemic disease with multiple remote lacunar infarcts involving the bilateral basal ganglia, thalami, and pons. 4. Multiple chronic micro hemorrhages scattered throughout the brain in a distribution most consistent with chronic poorly controlled hypertension. 5. MRA HEAD IMPRESSION: Normal intracranial MRA.  Carotid Dopplers Right Carotid:  Velocities in the right ICA are consistent with a 1-39% stenosis. Left Carotid: Velocities in the left ICA are consistent with a 1-39% stenosis. Vertebrals: Bilateral vertebral arteries demonstrate antegrade flow.  Discharge Instructions: Discharge Instructions    Ambulatory referral to Neurology   Complete by:  As directed    Follow up with stroke clinic NP (Jessica Vanschaick or Cecille Rubin, if both not available, consider Dr. Antony Contras, Dr. Bess Harvest, or Dr. Sarina Ill) at [facility] in about 4 weeks.   Diet - low sodium heart healthy   Complete by:  As directed    Discharge instructions   Complete by:  As directed    It was a pleasure taking care of you during your hospitalization, Ms. Varnadore!  1. You were hospitalized for a stroke. You will need to follow-up with neurology in about 4 weeks. They will call you to schedule an appointment. You should also take an aspirin 81mg  daily.   2. It is very important to keep your blood pressure under control to prevent future strokes. For your blood pressure, take lisinopril 20mg  daily and amlodipine  10mg  daily. Do not take clonidine.   3. You need to follow-up with your PCP in the next 2-3 weeks for blood pressure monitoring.  Feel free to call our clinic at [phone] if you have any questions.  Thanks, Dr. Annie Paras   Increase activity slowly   Complete by:  As directed       Signed: Tylik Treese, Andree Elk, MD 03/31/2018, 12:50 PM   Pager: [phone]

## 2018-03-31 NOTE — Care Management Note (Signed)
Case Management Note  Patient Details  Name: Belinda Ramirez MRN: [id number] Date of Birth: Dec 29, 1975  Subjective/Objective:                    Action/Plan: Pt discharging home with orders for [facility] services. Pt is without insurance so [employer] is the charity provider this week and will assess her.  Pt with orders for 3 in 1. Jermaine with [facility] DME notified and equipment delivered to the room. Pt has transportation home.  Expected Discharge Date:  03/31/18               Expected Discharge Plan:  [place]  In-House Referral:     Discharge planning Services  CM Consult  Post Acute Care Choice:  Durable Medical Equipment, Home Health Choice offered to:  Patient  DME Arranged:  3-N-1 DME Agency:  [place]:  PT [facility] Agency:  [facility](charity)  Status of Service:  Completed, signed off  If discussed at [place] of Stay Meetings, dates discussed:    Additional Comments:  Pollie Friar, RN 03/31/2018, 12:56 PM

## 2018-04-03 ENCOUNTER — Telehealth: Payer: Self-pay
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
## 2018-04-10 ENCOUNTER — Other Ambulatory Visit: Payer: Self-pay

## 2018-04-10 NOTE — Patient Outreach (Signed)
Patient triggered Red on EMMI Stroke Dashboard, notification sent to:  Enzo Montgomery, RN

## 2018-04-10 NOTE — Patient Outreach (Signed)
[place] [facility]) Care Management  04/10/2018  Belinda Ramirez 19-Feb-1975 [id number]    [name] on APL RED ON EMMI ALERT Day # 9 Date: 04/09/2018 Red Alert Reason: " Lost interest in things they used to enjoy? Yes  Sad,hopeless, anxious,or empty? Yes"   Outreach attempt #1 to patient. No answer. RN CM left HIPAA compliant voicemail message along with contact info.       Plan: RN CM will make outreach attempt to patient within 3-4 business days.  Enzo Montgomery, RN,BSN,CCM [place] Management Telephonic Care Management Coordinator Direct Phone: [phone] Toll Free: [phone] Fax: [phone]

## 2018-04-11 ENCOUNTER — Ambulatory Visit: Payer: Medicaid Other | Attending: Nurse Practitioner | Admitting: Nurse Practitioner

## 2018-04-11 ENCOUNTER — Encounter: Payer: Self-pay | Admitting: Nurse Practitioner

## 2018-04-11 ENCOUNTER — Other Ambulatory Visit: Payer: Self-pay

## 2018-04-11 ENCOUNTER — Telehealth: Payer: Self-pay | Admitting: Nurse Practitioner

## 2018-04-11 ENCOUNTER — Encounter: Payer: Self-pay | Admitting: Neurology

## 2018-04-11 VITALS — BP 162/84 | HR 69 | Temp 98.9°F | Ht 64.0 in | Wt 281.2 lb

## 2018-04-11 DIAGNOSIS — I10 Essential (primary) hypertension: Secondary | ICD-10-CM

## 2018-04-11 DIAGNOSIS — I639 Cerebral infarction, unspecified: Secondary | ICD-10-CM | POA: Diagnosis not present

## 2018-04-11 DIAGNOSIS — R7303 Prediabetes: Secondary | ICD-10-CM

## 2018-04-11 LAB — GLUCOSE, POCT (MANUAL RESULT ENTRY): POC Glucose: 91 mg/dl (ref 70–99)

## 2018-04-11 MED ORDER — LISINOPRIL 40 MG PO TABS: 40.0000 mg | ORAL_TABLET | Freq: Every day | ORAL | 3 refills | Status: DC

## 2018-04-11 MED FILL — LISINOPRIL 40 MG TABLET: 40 | 30 days supply | Qty: 30 | Fill #0

## 2018-04-11 NOTE — Patient Outreach (Signed)
[place] [facility]) Care Management  04/11/2018  Belinda Ramirez 1975/07/14 [id number]    [name] on APL RED ON EMMI ALERT Day # 9 Date: 04/09/2018 Red Alert Reason: " Lost interest in things they used to enjoy? Yes  Sad,hopeless, anxious,or empty? Yes"    Outreach attempt #2 to patient. No answer at present as call went straight to voicemail.      Plan: RN CM will make outreach attempt to patient within 3-4 business days.  Enzo Montgomery, RN,BSN,CCM [place] Management Telephonic Care Management Coordinator Direct Phone: [phone] Toll Free: [phone] Fax: [phone]

## 2018-04-11 NOTE — Telephone Encounter (Signed)
Will route to PCP 

## 2018-04-11 NOTE — Progress Notes (Signed)
Assessment & Plan:  Belinda Ramirez was seen today for follow-up.  Diagnoses and all orders for this visit:  Essential hypertension -     lisinopril (PRINIVIL,ZESTRIL) 40 MG tablet; Take 1 tablet (40 mg total) by mouth daily. Continue all antihypertensives as prescribed.  Remember to bring in your blood pressure log with you for your follow up appointment.  DASH/Mediterranean Diets are healthier choices for HTN.   Prediabetes -     Glucose (CBG) Continue blood sugar control as discussed in office today, low carbohydrate diet, and regular physical exercise as tolerated, 150 minutes per week (30 min each day, 5 days per week, or 50 min 3 days per week).   Acute CVA (cerebrovascular accident) [facility]) -     Ambulatory referral to Neurology    Patient has been counseled on age-appropriate routine health concerns for screening and prevention. These are reviewed and up-to-date. Referrals have been placed accordingly. Immunizations are up-to-date or declined.    Subjective:   Chief Complaint  Patient presents with  . Follow-up    Pt. is here for HFU follow-up.    HPI Belinda Ramirez 43 y.o. female presents to office today for follow up. She has a history of HTN, prediabetes, hemorrhagic CVA (2017), CKD stage 3 and most recently ischemic stroke.  She was admitted to the hospital 03-28-2018 with complaints of 1 day of slurred speech, left hemiplegia, and headache that occurred after she took her first dose of newly-prescribed clonidine.MRI head showed acute nonhemorrhagic infarcts in corona radiata and centrum semiovale as well as diffuse microhemorrhages consistent with uncontrolled HTN. ECHO and carotid dopplers were wnl. She was started on a daily aspirin 81mg . Her statin was continued. All of her symptoms resolved and she was discharged on 03-31-2018. She was discharged home with home PT and instructed to f/u with neurology as an outpatient.    Essential Hypertension Poorly controlled  today. She is currently taking amlodipine 10mg  and lisinopril 20mg . Her clonidine was discontinued in the hospital as there was concern that the recent restart of clonidine precipitated the stroke. However patient had been on clonidine in the past for over a year. Today I will increase her lisinopril from 20mg  to 40mg .   BP Readings from Last 3 Encounters:  04/11/18 (!) 162/84  03/31/18 (!) 141/79  03/24/18 (!) 166/115    Prediabetes Chronic and stable. She is not exercise or diet compliant. Denies any hypo or hyperglycemic symptoms. She does not monitor her blood glucose levels.  Lab Results  Component Value Date   HGBA1C 6.2 (H) 03/29/2018    Review of Systems  Constitutional: Negative for fever, malaise/fatigue and weight loss.  HENT: Negative.  Negative for nosebleeds.   Eyes: Negative.  Negative for blurred vision, double vision and photophobia.  Respiratory: Negative.  Negative for cough and shortness of breath.   Cardiovascular: Negative.  Negative for chest pain, palpitations and leg swelling.  Gastrointestinal: Negative.  Negative for heartburn, nausea and vomiting.  Musculoskeletal: Negative.  Negative for myalgias.  Neurological: Positive for weakness (She continues to have mild right-sided weakness that has been present since her 2017 stroke. ). Negative for dizziness, focal weakness, seizures and headaches.  Psychiatric/Behavioral: Negative.  Negative for suicidal ideas.    Past Medical History:  Diagnosis Date  . High cholesterol   . 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 History of cerebral infarction 04/13/2016   Dont see evidence for  thrombosis but do see prior hx of CVAs  . Hypertension   . Obesity   . Sickle cell trait ([place])   . Stroke ([place])   . Urinary incontinence    For years. Resolved 5/19 spontaneously    Past Surgical History:  Procedure Laterality Date  .  TUBAL LIGATION      Family History  Problem Relation Age of Onset  . Heart attack Maternal Grandfather   . Diabetes Mother   . Hypertension Mother   . Cancer Father   . Heart disease Maternal Grandmother     Social History Reviewed with no changes to be made today.   Outpatient Medications Prior to Visit  Medication Sig Dispense Refill  . albuterol (PROVENTIL HFA;VENTOLIN HFA) 108 (90 Base) MCG/ACT inhaler Inhale 2 puffs into the lungs every 6 (six) hours as needed for wheezing or shortness of breath. 1 Inhaler 2  . amLODipine (NORVASC) 10 MG tablet Take 1 tablet (10 mg total) by mouth daily. 30 tablet 0  . aspirin EC 81 MG EC tablet Take 1 tablet (81 mg total) by mouth daily. 30 tablet 1  . vitamin B-12 (CYANOCOBALAMIN) 1000 MCG tablet Take 1 tablet (1,000 mcg total) by mouth daily. 90 tablet 2  . lisinopril (PRINIVIL,ZESTRIL) 20 MG tablet Take 1 tablet (20 mg total) by mouth daily. 30 tablet 3  . acetaminophen (TYLENOL) 500 MG tablet Take 2 tablets (1,000 mg total) by mouth every 6 (six) hours as needed. (Patient not taking: Reported on 04/11/2018) 30 tablet 0  . atorvastatin (LIPITOR) 80 MG tablet Take 1 tablet (80 mg total) by mouth daily at 6 PM. (Patient not taking: Reported on 03/28/2018) 90 tablet 2  . ferrous sulfate 325 (65 FE) MG EC tablet Take 1 tablet (325 mg total) by mouth 2 (two) times daily. (Patient not taking: Reported on 03/28/2018) 60 tablet 3  . fluticasone (FLONASE) 50 MCG/ACT nasal spray Place 2 sprays into both nostrils daily. (Patient not taking: Reported on 03/28/2018) 16 g 6  . SUMAtriptan (IMITREX) 100 MG tablet Take 0.5 tablets (50 mg total) by mouth daily as needed for migraine. May repeat in 2 hours if headache persists or recurs. (Patient not taking: Reported on 03/28/2018) 15 tablet 0   No facility-administered medications prior to visit.     Allergies  Allergen Reactions  . Oxycodone Hives  . Percocet [Oxycodone-Acetaminophen] Hives  . Amoxicillin Hives     Has patient had a PCN reaction causing immediate rash, facial/tongue/throat swelling, SOB or lightheadedness with hypotension: No Has patient had a PCN reaction causing severe rash involving mucus membranes or skin necrosis: No Has patient had a PCN reaction that required hospitalization: No Has patient had a PCN reaction occurring within the last 10 years: Yes If all of the above answers are "NO", then may proceed with Cephalosporin use.   . Aspirin Other (See Comments)    Hx of hemorrhagic stroke (pt does take low dose aspirin )  . Latex Itching       Objective:    BP (!) 162/84 (BP Location: Left Arm, Patient Position: Sitting, Cuff Size: Large)   Pulse 69   Temp 98.9 F (37.2 C) (Oral)   Ht 5\' 4"  (1.626 m)   Wt 281 lb 3.2 oz (127.6 kg)   SpO2 98%   BMI 48.27 kg/m  Wt Readings from Last 3 Encounters:  04/11/18 281 lb 3.2 oz (127.6 kg)  03/28/18 279 lb 15.8 oz (127 kg)  03/24/18 281 lb 3.2  oz (127.6 kg)    Physical Exam Vitals signs and nursing note reviewed.  Constitutional:      Appearance: She is well-developed.  HENT:     Head: Normocephalic and atraumatic.  Neck:     Musculoskeletal: Normal range of motion.  Cardiovascular:     Rate and Rhythm: Normal rate and regular rhythm.     Heart sounds: Normal heart sounds. No murmur. No friction rub. No gallop.   Pulmonary:     Effort: Pulmonary effort is normal. No tachypnea or respiratory distress.     Breath sounds: Normal breath sounds. No decreased breath sounds, wheezing, rhonchi or rales.  Chest:     Chest wall: No tenderness.  Abdominal:     General: Bowel sounds are normal.     Palpations: Abdomen is soft.  Musculoskeletal: Normal range of motion.  Skin:    General: Skin is warm and dry.  Neurological:     Mental Status: She is alert and oriented to person, place, and time.     Coordination: Coordination normal.  Psychiatric:        Behavior: Behavior normal. Behavior is cooperative.        Thought  Content: Thought content normal.        Judgment: Judgment normal.          Patient has been counseled extensively about nutrition and exercise as well as the importance of adherence with medications and regular follow-up. The patient was given clear instructions to go to ER or return to medical center if symptoms don't improve, worsen or new problems develop. The patient verbalized understanding.   Follow-up: Return in about 3 days (around 04/14/2018) for BP RECHECK WITH LUKE.   Gildardo Pounds, FNP-BC [facility] [place], [place]   04/12/2018, 10:50 PM

## 2018-04-11 NOTE — Telephone Encounter (Signed)
Once a week for 2 weeks for PT

## 2018-04-12 ENCOUNTER — Encounter: Payer: Self-pay | Admitting: Nurse Practitioner

## 2018-04-12 ENCOUNTER — Other Ambulatory Visit: Payer: Self-pay

## 2018-04-12 NOTE — Telephone Encounter (Signed)
Approved.  

## 2018-04-12 NOTE — Patient Outreach (Signed)
[facility]) Care Management  04/12/2018  Tandi Hanko 09-25-1975 [id number]   [name] on APL RED ON EMMI ALERT Day #9 Date:04/09/2018 Red Alert Reason:" Lost interest in things they used to enjoy? Yes Sad,hopeless, anxious,or empty? Yes"    Outreach attempt #3 to patient. No answer.      Plan: RN CM will close case at this time.    Enzo Montgomery, RN,BSN,CCM [place] Management Telephonic Care Management Coordinator Direct Phone: [phone] Toll Free: [phone] Fax: [phone]

## 2018-04-13 NOTE — Telephone Encounter (Signed)
CMA attempt to call Belinda Ramirez from [place] to inform PCP approved for the verbal order. No answer and left a VM.

## 2018-04-14 ENCOUNTER — Ambulatory Visit: Payer: Medicaid Other | Attending: Nurse Practitioner | Admitting: Pharmacist

## 2018-04-14 ENCOUNTER — Other Ambulatory Visit: Payer: Self-pay

## 2018-04-14 ENCOUNTER — Encounter: Payer: Self-pay | Admitting: Pharmacist

## 2018-04-14 VITALS — BP 153/81 | HR 73

## 2018-04-14 DIAGNOSIS — I10 Essential (primary) hypertension: Secondary | ICD-10-CM

## 2018-04-14 MED ORDER — ATORVASTATIN CALCIUM 80 MG PO TABS: 80.0000 mg | ORAL_TABLET | Freq: Every day | ORAL | 2 refills | Status: DC

## 2018-04-14 MED ORDER — AMLODIPINE BESYLATE 10 MG PO TABS: 10.0000 mg | ORAL_TABLET | Freq: Every day | ORAL | 2 refills | Status: DC

## 2018-04-14 MED FILL — AMLODIPINE BESYLATE 10 MG T: 10 | 30 days supply | Qty: 30 | Fill #0

## 2018-04-14 MED FILL — ATORVASTATIN 80 MG TABLET: 80 | 30 days supply | Qty: 30 | Fill #0

## 2018-04-14 NOTE — Patient Instructions (Signed)
Thank you for coming to see [name] today.   Blood pressure today is improved.   Continue taking blood pressure medications as prescribed. Start taking lisinopril in the morning and amlodipine in the evening.   Limiting salt and caffeine, as well as exercising as able for at least 30 minutes for 5 days out of the week, can also help you lower your blood pressure.  Take your blood pressure at home if you are able. Please write down these numbers and bring them to your visits.  If you have any questions about medications, please call me [phone].  [name]

## 2018-04-14 NOTE — Progress Notes (Signed)
   S:   PCP: Zelda  Patient arrives in good spirits.  Presents to the clinic for management. Patient was referred by Zelda on 04/11/18. BP at that visit 162/84. Lisinopril increased to 40 mg daily.   Of note, pt with hospital admission 03/28/18. Discharged 04/10/18. Presented in hypertensive emergency; later dx with ischemic stroke.   Today, pt denies HA or blurred vision. Denies chest pain, dyspnea, or lower extremity swelling.   Patient reports adherence with medications.  Current BP Medications include:   - Amlodipine 10 mg daily - Lisinopril 40 mg daily (taking 2 of her 20 mg tablets)  Antihypertensives tried in the past include:  - clonidine - HCTZ?  - chlorthalidone - hydralazine  Dietary habits include: limits salt, drinks 2, 6-oz bottles Pepsi Exercise habits include: limited mobility (recent stroke) Family / Social history:  - FHx: HTN (mother) - Tobacco: never smoker - Alcohol: occasional alcohol use   Home BP readings: doesn't take at home  O:  L arm after 5 minutes rest: 153/81, HR 73  Last 3 Office BP readings: BP Readings from Last 3 Encounters:  04/11/18 (!) 162/84  03/31/18 (!) 141/79  03/24/18 (!) 166/115   BMET    Component Value Date/Time   NA 140 03/28/2018 1014   NA 140 02/24/2018 1612   K 3.9 03/28/2018 1014   CL 102 03/28/2018 1014   CO2 27 03/28/2018 1014   GLUCOSE 114 (H) 03/28/2018 1014   BUN 9 03/28/2018 1014   BUN 24 02/24/2018 1612   CREATININE 1.53 (H) 03/28/2018 1014   CALCIUM 9.1 03/28/2018 1014   GFRNONAA 41 (L) 03/28/2018 1014   GFRAA 47 (L) 03/28/2018 1014   Renal function: Estimated Creatinine Clearance: 62.1 mL/min (A) (by C-G formula based on SCr of 1.53 mg/dL (H)).  Clinical ASCVD: Yes - recent stroke The ASCVD Risk score Mikey Bussing DC Jr., et al., 2013) failed to calculate for the following reasons:   The patient has a prior MI or stroke diagnosis  A/P: Hypertension longstanding currently uncontrolled on current  medications. BP Goal <130/80 mmHg. Patient is adherent with current medications. Discussed with PCP today. Will have patient follow-up with her for further modifications.   In the future, I recommend to add back low dose (12.5 mg daily) chlorthalidone for morbidity/mortality benefit. She has been on the 50 mg dose but I worry about her risk of hypokalemia given her hx/most recent K level of 3.9 (2/11).   -Continued amlodipine, lisinopril.  -Will have pt pick-up refills today. Will re-start atorvastatin as pt reports not taking in some time. Most recent LFT WNL (2/11). -Counseled on lifestyle modifications for blood pressure control including reduced dietary sodium, increased exercise, adequate sleep  Results reviewed and written information provided. Total time in face-to-face counseling 15 minutes.   F/U Clinic Visit in 1 month with PCP after establishing with Neurology.    Patient seen with:  Beckey Rutter, PharmD Candidate  [employer]  Class of 2022  Benard Halsted, PharmD, [place] [phone]

## 2018-04-14 NOTE — Patient Outreach (Signed)
[facility]) Care Management  04/14/2018  Belinda Ramirez 06/22/1975 [id number]    [name] ON APL RED ON EMMI ALERT Day # 13 Date: 227/2020 Red Alert Reason: " Questions/problems with meds? Yes"   Outreach attempt  to patient. No answer at present.    Plan: RN CM will close case due to inability to reach pt.  Enzo Montgomery, RN,BSN,CCM [place] Management Telephonic Care Management Coordinator Direct Phone: [phone] Toll Free: [phone] Fax: [phone]

## 2018-04-21 MED FILL — traZODone HCL 50 MG TABS: 50 | 30 days supply | Qty: 15 | Fill #0

## 2018-04-21 MED FILL — ESCITALOPRAM 10 MG TABLET: 10 | 30 days supply | Qty: 30 | Fill #0

## 2018-04-26 ENCOUNTER — Ambulatory Visit: Payer: Self-pay | Admitting: Nurse Practitioner

## 2018-05-01 NOTE — Telephone Encounter (Signed)
Patient called requesting a letter for pick up that states she cannot work.

## 2018-05-08 ENCOUNTER — Encounter: Payer: Self-pay | Admitting: Nurse Practitioner

## 2018-05-08 NOTE — Telephone Encounter (Signed)
I attempted to reach the patient. LVM. Her letter is in the system. Please let her know she has an upcoming appt with [facility] on 05-11-2018 that she can not miss. As her determination for length of how long she should be out of work will also be determined by Neurology.

## 2018-05-08 NOTE — Telephone Encounter (Signed)
Will route to PCP 

## 2018-05-08 NOTE — Telephone Encounter (Signed)
Follow up  Pt is calling back to check on the status of her letter stating she can not work. Please f/u

## 2018-05-09 NOTE — Telephone Encounter (Signed)
Patient was informed regarding letter. Pt. Was also informed on her [facility] appt.

## 2018-05-10 NOTE — Progress Notes (Signed)
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  History supplemented by hospital records.  She was admitted to [facility] on 03/28/2018 after presenting with one day of headache, slurred speech and worsening left sided weakness that began after first dose of clonidine.  She had recently been hospitalized for hypertensive emergency.  Blood pressure in the ED was 178/95 but had been up to 207/110.  CT of head was personally reviewed and showed chronic small vessel ischemic changes but no acute abnormality.  She was not a tPA candidate as she was outside the window.  MRI of brain was personally reviewed and showed small acute right corona radiata and centrum semiovale infarcts.  Atrophy, chronic microhemorrhages and remote lacunar infarcts involving both basal ganglia, thalami and pons also noted.  MRA of head was personally reviewed and showed no emergent large vessel occlusion or stenosis.  Carotid doppler revealed 1-39% bilateral ICA stenosis with antegrade flow of both vertebral arteries.  2D Echo showed EF above 65% with no source of embolus.  LDL was 99.  Hgb A1c was 6.2.  Blood pressure medications were held for permissive hypertension.  Upon discharge, lisinopril and amlodipine were resumed.  Clonidine was discontinued.  She reportedly had not been on antiplatelet therapy and was started on ASA 81mg  daily.  However, she reports to me that she had been taking one ASA daily.  She was continued on Lipitor  80mg  daily.    She has residual left sided weakness.  When she is watching TV, she sometimes sees double.  She still has intermittent headaches, moderate right occipital pounding that radiates down the right posterior neck.  There is associated nausea, photophobia and phonophobia.  It lasts 1 to 2 hours and has been occurring once every 2 days.  She has not been treating it with analgesics.  PAST MEDICAL HISTORY: Past Medical History:  Diagnosis Date  . High cholesterol   . 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 Hypertension   . Obesity   . Sickle cell trait ([place])   . Stroke ([place])   . Urinary incontinence    For years. Resolved 5/19 spontaneously    PAST SURGICAL HISTORY: Past Surgical History:  Procedure Laterality Date  . TUBAL LIGATION      MEDICATIONS: Current Outpatient Medications on File Prior to Visit  Medication Sig Dispense Refill  . acetaminophen (TYLENOL) 500 MG tablet Take 2 tablets (1,000 mg total) by mouth every 6 (six) hours as needed. (Patient not taking: Reported on 04/11/2018) 30 tablet 0  . albuterol (PROVENTIL HFA;VENTOLIN HFA) 108 (90 Base) MCG/ACT inhaler Inhale 2 puffs into the lungs every 6 (six) hours as needed for wheezing or shortness of breath. 1 Inhaler 2  . amLODipine (NORVASC) 10 MG tablet Take 1 tablet (10 mg total) by mouth daily. 30 tablet 2  . aspirin EC 81 MG EC tablet  Take 1 tablet (81 mg total) by mouth daily. 30 tablet 1  . atorvastatin (LIPITOR) 80 MG tablet Take 1 tablet (80 mg total) by mouth daily at 6 PM. 30 tablet 2  . ferrous sulfate 325 (65 FE) MG EC tablet Take 1 tablet (325 mg total) by mouth 2 (two) times daily. (Patient not taking: Reported on 03/28/2018) 60 tablet 3  . fluticasone (FLONASE) 50 MCG/ACT nasal spray Place 2 sprays into  both nostrils daily. (Patient not taking: Reported on 03/28/2018) 16 g 6  . lisinopril (PRINIVIL,ZESTRIL) 40 MG tablet Take 1 tablet (40 mg total) by mouth daily. 90 tablet 3  . SUMAtriptan (IMITREX) 100 MG tablet Take 0.5 tablets (50 mg total) by mouth daily as needed for migraine. May repeat in 2 hours if headache persists or recurs. (Patient not taking: Reported on 03/28/2018) 15 tablet 0  . vitamin B-12 (CYANOCOBALAMIN) 1000 MCG tablet Take 1 tablet (1,000 mcg total) by mouth daily. 90 tablet 2   No current facility-administered medications on file prior to visit.     ALLERGIES: Allergies  Allergen Reactions  . Oxycodone Hives  . Percocet [Oxycodone-Acetaminophen] Hives  . Amoxicillin Hives    Has patient had a PCN reaction causing immediate rash, facial/tongue/throat swelling, SOB or lightheadedness with hypotension: No Has patient had a PCN reaction causing severe rash involving mucus membranes or skin necrosis: No Has patient had a PCN reaction that required hospitalization: No Has patient had a PCN reaction occurring within the last 10 years: Yes If all of the above answers are "NO", then may proceed with Cephalosporin use.   . Aspirin Other (See Comments)    Hx of hemorrhagic stroke (pt does take low dose aspirin )  . Latex Itching    FAMILY HISTORY: Family History  Problem Relation Age of Onset  . Heart attack Maternal Grandfather   . Diabetes Mother   . Hypertension Mother   . Cancer Father   . Heart disease Maternal Grandmother    SOCIAL HISTORY: Social History   Socioeconomic History  . Marital status: Single    Spouse name: Not on file  . Number of children: 0  . Years of education: 15  . Highest education level: Not on file  Occupational History  . Not on file  Social Needs  . Financial resource strain: Not very hard  . Food insecurity:    Worry: Never true    Inability: Never true  . Transportation needs:    Medical: No    Non-medical: No  Tobacco  Use  . Smoking status: Never Smoker  . Smokeless tobacco: Never Used  Substance and Sexual Activity  . Alcohol use: Yes    Alcohol/week: 0.0 standard drinks    Comment: occaisonal  . Drug use: No  . Sexual activity: Yes    Birth control/protection: Other-see comments    Comment: BTL  Lifestyle  . Physical activity:    Days per week: 1 day    Minutes per session: 10 min  . Stress: Only a little  Relationships  . Social connections:    Talks on phone: Three times a week    Gets together: Twice a week    Attends religious service: More than 4 times per year    Active member of club or organization: No    Attends meetings of clubs or organizations: Never    Relationship status: Never married  . Intimate partner violence:    Fear of current or ex partner: No  Emotionally abused: No    Physically abused: No    Forced sexual activity: No  Other Topics Concern  . Not on file  Social History Narrative   Drinks soda and tea daily     REVIEW OF SYSTEMS: Constitutional: No fevers, chills, or sweats, no generalized fatigue, change in appetite Eyes: No visual changes, double vision, eye pain Ear, nose and throat: No hearing loss, ear pain, nasal congestion, sore throat Cardiovascular: No chest pain, palpitations Respiratory:  No shortness of breath at rest or with exertion, wheezes GastrointestinaI: No nausea, vomiting, diarrhea, abdominal pain, fecal incontinence Genitourinary:  No dysuria, urinary retention or frequency Musculoskeletal:  No neck pain, back pain Integumentary: No rash, pruritus, skin lesions Neurological: as above Psychiatric: No depression, insomnia, anxiety Endocrine: No palpitations, fatigue, diaphoresis, mood swings, change in appetite, change in weight, increased thirst Hematologic/Lymphatic:  No purpura, petechiae. Allergic/Immunologic: no itchy/runny eyes, nasal congestion, recent allergic reactions, rashes  PHYSICAL EXAM: Blood pressure (!) 190/112,  pulse 79, temperature 98.1 F (36.7 C), height 5\' 4"  (1.626 m), weight 282 lb (127.9 kg), SpO2 97 %. General: No acute distress.  Patient appears well-groomed.  Head:  Normocephalic/atraumatic Eyes:  fundi examined but not visualized Neck: supple, no paraspinal tenderness, full range of motion Back: No paraspinal tenderness Heart: regular rate and rhythm Lungs: Clear to auscultation bilaterally. Vascular: No carotid bruits. Neurological Exam: Mental status: alert and oriented to person, place, and time, recent and remote memory intact, fund of knowledge intact, attention and concentration intact, speech fluent and not dysarthric, language intact. Cranial nerves: CN I: not tested CN II: pupils equal, round and reactive to light, visual fields intact CN III, IV, VI:  full range of motion, no nystagmus, no ptosis CN V: facial sensation intact CN VII: upper and lower face symmetric CN VIII: hearing intact CN IX, X: gag intact, uvula midline CN XI: sternocleidomastoid and trapezius muscles intact CN XII: tongue midline Bulk & Tone: normal, no fasciculations. Motor:  4+/5 deltoids bilaterally, otherwise 5-/5 in all extremities, slightly weaker in left triceps/biceps/grip and hip flexion). Sensation:  Pinprick and vibration sensation intact. Deep Tendon Reflexes:  2+ throughout, toes downgoing.  Finger to nose testing:  Without dysmetria.  Heel to shin:  Without dysmetria.  Gait:  Cautious, antalgic gait, wide-based.  Unable to tandem walk. Romberg negative.  IMPRESSION: 1.  Small right corona radiata and centrum semiovale ischemic infarcts secondary to small vessel disease 2.  Longstanding uncontrolled hypertension.  Likely contributing factor to stroke.  She states medication compliance.  .   3.  Hyperlipidemia 4.  Morbid obesity (BMI 48.41) 5.  History of PRES  PLAN: 1.  Continue ASA 81mg  daily for secondary stroke prevention 2.  Continue Lipitor 80mg  daily.  LDL goal less than 70.    3.  Optimize blood pressure control.  PCP may want to consider workup for pheochromocytoma if not yet performed.  Given her CKD and uncontrolled blood pressure, may want to consider referral to nephrology as well. 4.  Optimize glycemic control.  Hgb A1c goal less than 7.0 5.  Weight loss/exercise/diet 6.  Follow up in 4 months.  Thank you for allowing me to take part in the care of this patient.  Metta Clines, DO  CC:  Geryl Rankins, NP

## 2018-05-11 ENCOUNTER — Other Ambulatory Visit: Payer: Self-pay

## 2018-05-11 ENCOUNTER — Encounter: Payer: Self-pay | Admitting: Neurology

## 2018-05-11 ENCOUNTER — Ambulatory Visit (INDEPENDENT_AMBULATORY_CARE_PROVIDER_SITE_OTHER): Payer: Medicaid Other | Admitting: Neurology

## 2018-05-11 VITALS — BP 190/112 | HR 79 | Temp 98.1°F | Ht 64.0 in | Wt 282.0 lb

## 2018-05-11 DIAGNOSIS — I10 Essential (primary) hypertension: Secondary | ICD-10-CM

## 2018-05-11 DIAGNOSIS — N183 Chronic kidney disease, stage 3 unspecified: Secondary | ICD-10-CM

## 2018-05-11 DIAGNOSIS — E785 Hyperlipidemia, unspecified: Secondary | ICD-10-CM

## 2018-05-11 DIAGNOSIS — I69354 Hemiplegia and hemiparesis following cerebral infarction affecting left non-dominant side: Secondary | ICD-10-CM | POA: Diagnosis not present

## 2018-05-11 NOTE — Patient Instructions (Signed)
1.  Increase aspirin to 325mg  daily 2.  Continue atorvastatin 80mg  daily 3.  Continue blood pressure medications.   4.  Follow the DASH diet. 5.  Routine exercise (daily walks) 6.  Follow up in 4 months.

## 2018-05-16 ENCOUNTER — Ambulatory Visit: Payer: Self-pay | Admitting: Nurse Practitioner

## 2018-05-31 ENCOUNTER — Ambulatory Visit: Payer: Medicaid Other | Attending: Primary Care | Admitting: Nurse Practitioner

## 2018-05-31 ENCOUNTER — Telehealth: Payer: Self-pay | Admitting: Nurse Practitioner

## 2018-05-31 ENCOUNTER — Other Ambulatory Visit: Payer: Self-pay

## 2018-05-31 DIAGNOSIS — R7303 Prediabetes: Secondary | ICD-10-CM | POA: Diagnosis not present

## 2018-05-31 DIAGNOSIS — M542 Cervicalgia: Secondary | ICD-10-CM | POA: Diagnosis present

## 2018-05-31 DIAGNOSIS — M79662 Pain in left lower leg: Secondary | ICD-10-CM | POA: Insufficient documentation

## 2018-05-31 DIAGNOSIS — M7989 Other specified soft tissue disorders: Principal | ICD-10-CM

## 2018-05-31 DIAGNOSIS — N183 Chronic kidney disease, stage 3 unspecified: Secondary | ICD-10-CM

## 2018-05-31 DIAGNOSIS — Z79899 Other long term (current) drug therapy: Secondary | ICD-10-CM | POA: Diagnosis not present

## 2018-05-31 MED ORDER — MISC. DEVICES MISC: 0 refills | Status: DC

## 2018-05-31 MED ORDER — ACCU-CHEK FASTCLIX LANCET KIT: 1.0000 | PACK | Freq: Once | 0 refills | Status: AC

## 2018-05-31 MED ORDER — ACCU-CHEK FASTCLIX LANCETS MISC: 3 refills | Status: DC

## 2018-05-31 MED ORDER — ACCU-CHEK GUIDE ME W/DEVICE KIT: 1.0000 | PACK | Freq: Once | 0 refills | Status: AC

## 2018-05-31 MED ORDER — GLUCOSE BLOOD VI STRP: ORAL_STRIP | 12 refills | Status: DC

## 2018-05-31 MED ORDER — ACCU-CHEK GUIDE CONTROL VI LIQD: 1.0000 | Freq: Once | 0 refills | Status: DC | PRN

## 2018-05-31 MED ORDER — CYCLOBENZAPRINE HCL 5 MG PO TABS: 5.0000 mg | ORAL_TABLET | Freq: Three times a day (TID) | ORAL | 1 refills | Status: AC | PRN

## 2018-05-31 NOTE — Progress Notes (Deleted)
Assessment & Plan:  There are no diagnoses linked to this encounter.  Patient has been counseled on age-appropriate routine health concerns for screening and prevention. These are reviewed and up-to-date. Referrals have been placed accordingly. Immunizations are up-to-date or declined.    Subjective:   Chief Complaint  Patient presents with  . Ear Problem    Pt. stated her lower part of her ear hurts when she turn her heard and the back of her head is sore. Pt. is requesting allergy medication.   HPI Belinda Ramirez 43 y.o. female presents to office today    Pain is intermittent  Swelling is outer left knee    ROS  Past Medical History:  Diagnosis Date  . High cholesterol   . 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 Hypertension   . Obesity   . Sickle cell trait ([place])   . Stroke ([place])   . Urinary incontinence    For years. Resolved 5/19 spontaneously    Past Surgical History:  Procedure Laterality Date  . TUBAL LIGATION    . WISDOM TOOTH EXTRACTION      Family History  Problem Relation Age of Onset  . Heart attack Maternal Grandfather   . Diabetes Mother   . Hypertension Mother   . Cancer Father   . Heart disease Maternal Grandmother   . Diabetes Sister   . Diabetes Brother     Social History Reviewed with no changes to be made today.   Outpatient Medications Prior to Visit  Medication Sig Dispense Refill  . albuterol (PROVENTIL HFA;VENTOLIN HFA) 108 (90 Base) MCG/ACT inhaler Inhale 2 puffs into the lungs every 6 (six) hours as needed for wheezing or shortness of breath. 1 Inhaler 2  . amLODipine (NORVASC) 10 MG tablet Take 1 tablet (10 mg total) by mouth daily. 30 tablet 2  . aspirin EC 81 MG EC tablet Take 1 tablet (81 mg total) by mouth daily. 30 tablet 1   . atorvastatin (LIPITOR) 80 MG tablet Take 1 tablet (80 mg total) by mouth daily at 6 PM. 30 tablet 2  . fluticasone (FLONASE) 50 MCG/ACT nasal spray Place 2 sprays into both nostrils daily. 16 g 6  . lisinopril (PRINIVIL,ZESTRIL) 40 MG tablet Take 1 tablet (40 mg total) by mouth daily. 90 tablet 3  . vitamin B-12 (CYANOCOBALAMIN) 1000 MCG tablet Take 1 tablet (1,000 mcg total) by mouth daily. 90 tablet 2  . acetaminophen (TYLENOL) 500 MG tablet Take 2 tablets (1,000 mg total) by mouth every 6 (six) hours as needed. (Patient not taking: Reported on 04/11/2018) 30 tablet 0  . ferrous sulfate 325 (65 FE) MG EC tablet Take 1 tablet (325 mg total) by mouth 2 (two) times daily. 60 tablet 3  . SUMAtriptan (IMITREX) 100 MG tablet Take 0.5 tablets (50 mg total) by mouth daily as needed for migraine. May repeat in 2 hours if headache persists or recurs. 15 tablet 0   No facility-administered medications prior to visit.     Allergies  Allergen Reactions  . Oxycodone Hives  . Percocet [Oxycodone-Acetaminophen] Hives  . Amoxicillin Hives    Has patient had a PCN reaction causing immediate rash, facial/tongue/throat swelling, SOB or lightheadedness with hypotension: No Has patient had a PCN reaction causing severe rash involving mucus membranes or  skin necrosis: No Has patient had a PCN reaction that required hospitalization: No Has patient had a PCN reaction occurring within the last 10 years: Yes If all of the above answers are "NO", then may proceed with Cephalosporin use.   . Aspirin Other (See Comments)    Hx of hemorrhagic stroke (pt does take low dose aspirin )  . Clonidine Derivatives   . Latex Itching       Objective:    There were no vitals taken for this visit. Wt Readings from Last 3 Encounters:  05/11/18 282 lb (127.9 kg)  04/11/18 281 lb 3.2 oz (127.6 kg)  03/28/18 279 lb 15.8 oz (127 kg)    Physical Exam       Patient has been counseled extensively about nutrition and  exercise as well as the importance of adherence with medications and regular follow-up. The patient was given clear instructions to go to ER or return to medical center if symptoms don't improve, worsen or new problems develop. The patient verbalized understanding.   Follow-up: No follow-ups on file.   Gildardo Pounds, FNP-BC [facility] [place], [place]   05/31/2018, 11:21 AM

## 2018-05-31 NOTE — Telephone Encounter (Signed)
Belinda Ramirez with imaging called for a prior authorization. States she will be in until 4 and will be back in tomorrow at 8. States she will provide fax# so it can be sent.   [phone]

## 2018-05-31 NOTE — Telephone Encounter (Signed)
CMA attempt to reach Belinda Ramirez to inform patient need to get the DVT imaging done and CMA will still proceed the prior auth for approval. Per provider patient need DVT ultrasound asap.  No answer and left a VM.

## 2018-06-01 ENCOUNTER — Telehealth (HOSPITAL_COMMUNITY): Payer: Self-pay | Admitting: Rehabilitation

## 2018-06-01 NOTE — Telephone Encounter (Signed)
The above patient or their representative was contacted and gave the following answers to these questions:         Do you have any of the following symptoms? No  Fever                    Cough                   Shortness of breath  Do  you have any of the following other symptoms? No   muscle pain         vomiting,        diarrhea        rash         weakness        red eye        abdominal pain         bruising          bruising or bleeding              joint pain           severe headache    Have you been in contact with someone who was or has been sick in the past 2 weeks? No  Yes                 Unsure                         Unable to assess   Does the person that you were in contact with have any of the following symptoms?   Cough         shortness of breath           muscle pain         vomiting,            diarrhea            rash            weakness           fever            red eye           abdominal pain           bruising  or  bleeding                joint pain                severe headache               Have you  or someone you have been in contact with traveled internationally in th last month? No        If yes, which countries?   Have you  or someone you have been in contact with traveled outside [place] in th last month? No         If yes, which state and city?   COMMENTS OR ACTION PLAN FOR THIS PATIENT:          

## 2018-06-01 NOTE — Telephone Encounter (Signed)
New Message  Crystal from imaging is calling because she spoke with you about a prior auth for the pt's DVT, she states she called the pt to come get the test today but pt needed transportation and per her own preference wants to wait until tomorrow. Please f/u

## 2018-06-02 ENCOUNTER — Encounter: Payer: Self-pay | Admitting: Nurse Practitioner

## 2018-06-02 ENCOUNTER — Encounter (HOSPITAL_COMMUNITY): Payer: Self-pay

## 2018-06-02 NOTE — Telephone Encounter (Signed)
CMA spoke to Caspar from the imaging and wanted to inform PCP that they had tried to get patient in for a STAT DVT on Thursday, due to transportation patient stated she can make it on Friday and was schedule for a stat DVT.  Patient called and stated her SCAT took her to the wrong place and she missed her appt. For the DVT.  Crystal wanted to let PCP know they had tried getting patient in but patient was a no show.   Patient did stated she will be coming Tuesday to the imaging center to get her DVT done.

## 2018-06-02 NOTE — Progress Notes (Signed)
Virtual Visit via Telephone Note  I connected with Belinda Ramirez on 05/31/18 at 11:21 AM EDT by telephone and verified that I am speaking with the correct person using two identifiers.   I discussed the limitations, risks, security and privacy concerns of performing an evaluation and management service by telephone and the availability of in person appointments. I also discussed with the patient that there may be a patient responsible charge related to this service. The patient expressed understanding and agreed to proceed.   History of Present Illness: She has complaints of right sided neck pain and swelling of LLE. She has history of prediabetes and stage III CKD.  She is requesting a meter in order to start monitoring her blood glucose levels at home.  Lab Results  Component Value Date   HGBA1C 6.2 (H) 03/29/2018   Neck Pain Patient complains of neck pain. Event that precipitate these symptoms: none known, Could have possibly exacerbated her neck pain as she sleeps in a recliner. Onset of symptoms several days ago, unchanged since that time. Patient denies radiating, stabbing pain or paresthesia. Patient has had no prior neck problems.  Previous treatments include: none.  Edema Patient complains of edema. The location of the edema is knee(s) left.  The edema has been mild and moderate.  Onset of symptoms was several days ago, unchanged since that time. The edema is present intermittently. The swelling has been aggravated by nothing, relieved by nothing, and been associated with risk for DVT morbid obesity, history of stroke. Cardiac risk factors include dyslipidemia and obesity (BMI >= 30 kg/m2).  She states the swelling is located around the outer left knee and slightly below. There is also pain associated with the swelling.      Observations/Objective: Alert and Oriented x3  Assessment and Plan: Neck pain on right side -     cyclobenzaprine (FLEXERIL) 5 MG tablet; Take 1 tablet (5  mg total) by mouth 3 (three) times daily as needed for up to 14 days for muscle spasms. May alternate with heat and ice application. May also alternate with tylenol and ibuprofen as needed for pain.   Pain and swelling of left lower leg -     Misc. Devices MISC; Please provide patient with insurance approved thigh high compression stockings for swelling. -     VAS [name] LOWER EXTREMITY VENOUS (DVT); Future  Prediabetes -     glucose blood (ACCU-CHEK GUIDE) test strip; Use as instructed. Check blood glucose by fingerstick twice per day. -     Blood Glucose Monitoring Suppl (ACCU-CHEK GUIDE ME) w/Device KIT; 1 each by Does not apply route once for 1 dose. -     Lancets Misc. (ACCU-CHEK FASTCLIX LANCET) KIT; 1 kit by Does not apply route once for 1 dose. -     Blood Glucose Calibration (ACCU-CHEK GUIDE CONTROL) LIQD; 1 each by In Vitro route once as needed for up to 1 dose. -     Accu-Chek FastClix Lancets MISC; Use as instructed. Inject into the skin twice daily Controlled Continue medications as prescribed.  Continue blood sugar control as discussed in office today, low carbohydrate diet, and regular physical exercise as tolerated, 150 minutes per week (30 min each day, 5 days per week, or 50 min 3 days per week). Keep blood sugar logs with fasting goal of 90-130 mg/dl, post prandial (after you eat) less than 180.  For Hypoglycemia: BS <60 and Hyperglycemia BS >400; contact the clinic ASAP. Annual eye exams and foot  exams are recommended.  Stage 3 chronic kidney disease ([place]) -     Ambulatory referral to Nephrology   Follow Up Instructions:  Return in about 4 weeks (around 06/28/2018).     I discussed the assessment and treatment plan with the patient. The patient was provided an opportunity to ask questions and all were answered. The patient agreed with the plan and demonstrated an understanding of the instructions.   The patient was advised to call back or seek an in-person evaluation if  the symptoms worsen or if the condition fails to improve as anticipated.  I provided 20 minutes of non-face-to-face time during this encounter.   Gildardo Pounds, NP

## 2018-06-02 NOTE — Telephone Encounter (Signed)
Noted  

## 2018-06-05 ENCOUNTER — Telehealth (HOSPITAL_COMMUNITY): Payer: Self-pay | Admitting: Rehabilitation

## 2018-06-05 NOTE — Telephone Encounter (Signed)
The above patient or their representative was contacted and gave the following answers to these questions:         Do you have any of the following symptoms? No  Fever                    Cough                   Shortness of breath  Do  you have any of the following other symptoms? No   muscle pain         vomiting,        diarrhea        rash         weakness        red eye        abdominal pain         bruising          bruising or bleeding              joint pain           severe headache    Have you been in contact with someone who was or has been sick in the past 2 weeks? No  Yes                 Unsure                         Unable to assess   Does the person that you were in contact with have any of the following symptoms?   Cough         shortness of breath           muscle pain         vomiting,            diarrhea            rash            weakness           fever            red eye           abdominal pain           bruising  or  bleeding                joint pain                severe headache               Have you  or someone you have been in contact with traveled internationally in th last month? No        If yes, which countries?   Have you  or someone you have been in contact with traveled outside [place] in th last month? No         If yes, which state and city?   COMMENTS OR ACTION PLAN FOR THIS PATIENT:          

## 2018-06-06 ENCOUNTER — Ambulatory Visit (HOSPITAL_COMMUNITY)
Admission: RE | Admit: 2018-06-06 | Discharge: 2018-06-06 | Disposition: A | Discharge status: Home / Self Care | Payer: Medicaid Other | Source: Ambulatory Visit | Attending: Nurse Practitioner | Admitting: Nurse Practitioner

## 2018-06-06 ENCOUNTER — Other Ambulatory Visit: Payer: Self-pay

## 2018-06-06 DIAGNOSIS — M7989 Other specified soft tissue disorders: Secondary | ICD-10-CM | POA: Diagnosis not present

## 2018-06-06 DIAGNOSIS — M79662 Pain in left lower leg: Secondary | ICD-10-CM | POA: Diagnosis not present

## 2018-06-06 MED FILL — ACCU-CHEK FASTCLIX LANCETS: 50 days supply | Qty: 102 | Fill #0

## 2018-06-06 MED FILL — FLUTICASONE PROP 50 MCG SPR: 50 | 30 days supply | Qty: 16 | Fill #0

## 2018-06-06 MED FILL — ACCU-CHEK GUIDE TEST STRIP: 50 days supply | Qty: 100 | Fill #0

## 2018-06-06 MED FILL — ACCU-CHEK GUIDE W/DEVICE KI: W/DEVICE | 30 days supply | Qty: 1 | Fill #0

## 2018-06-06 MED FILL — CYCLOBENZAPRINE 5 MG TABLET: 5 | 20 days supply | Qty: 60 | Fill #0

## 2018-06-06 NOTE — Telephone Encounter (Signed)
Noted  

## 2018-06-09 NOTE — Telephone Encounter (Signed)
-----   Message from Gildardo Pounds, NP sent at 06/08/2018 10:44 AM EDT ----- [name] does not show DVT however it does show varicose veins and as we discussed before you will need compression stockings. Please let us know if you would like for [name] to fax your prescription to a medical supply store or if you would like to come pick up your prescription for compression stockings. You will need to wear them for at least 6 weeks to notice significant improvement. You do not wear them at night.

## 2018-06-09 NOTE — Telephone Encounter (Signed)
CMA spoke to patient to inform on Ultrasound results and PCP advising.  Pt. Stated she would like to pick up her Rx script for compression stocking and take it to a medical supply store herself.

## 2018-06-16 MED FILL — hydrALAZINE HCL 50 MG TABS: 50 | 30 days supply | Qty: 90 | Fill #0

## 2018-06-19 MED ORDER — MISC. DEVICES MISC: 0 refills | Status: DC

## 2018-06-19 NOTE — Telephone Encounter (Signed)
Patient came to pick up her stocking compression script.

## 2018-06-20 ENCOUNTER — Other Ambulatory Visit: Payer: Self-pay | Admitting: Nephrology

## 2018-06-20 DIAGNOSIS — N183 Chronic kidney disease, stage 3 unspecified: Secondary | ICD-10-CM

## 2018-06-23 MED FILL — CEPHALEXIN 500 MG CAPSULE: 500 | 5 days supply | Qty: 10 | Fill #0

## 2018-06-23 MED FILL — VIT D2 1.25 MG (50,000 UNIT: 1.25 MG | 84 days supply | Qty: 12 | Fill #0

## 2018-06-30 ENCOUNTER — Other Ambulatory Visit: Payer: Medicaid Other

## 2018-07-05 ENCOUNTER — Ambulatory Visit
Admission: RE | Admit: 2018-07-05 | Discharge: 2018-07-05 | Disposition: A | Discharge status: Home / Self Care | Payer: Medicaid Other | Source: Ambulatory Visit | Attending: Nephrology | Admitting: Nephrology

## 2018-07-05 DIAGNOSIS — N183 Chronic kidney disease, stage 3 unspecified: Secondary | ICD-10-CM

## 2018-08-17 DIAGNOSIS — Z0271 Encounter for disability determination: Secondary | ICD-10-CM

## 2018-09-10 NOTE — Progress Notes (Addendum)
Virtual Visit via Telephone Note The purpose of this virtual visit is to provide medical care while limiting exposure to the novel coronavirus.    Consent was obtained for phone visit:  Yes Answered questions that patient had about telehealth interaction:  Yes I discussed the limitations, risks, security and privacy concerns of performing an evaluation and management service by telephone. I also discussed with the patient that there may be a patient responsible charge related to this service. The patient expressed understanding and agreed to proceed.  Pt location: Home Physician Location: Home Name of referring provider:  Gildardo Pounds, NP I connected with .Belinda Ramirez at patients initiation/request on 09/11/2018 at  9:50 AM EDT by telephone and verified that I am speaking with the correct person using two identifiers.  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  UPDATE: Current medications:  ASA 81mg ; atorvastatin 80mg ; amlodipine; lisinopirl  No new issues  HISTORY: She was admitted to [facility] on 03/28/2018 after presenting with one day of headache, slurred speech and worsening left sided weakness that began after first dose of clonidine.  She had recently been hospitalized for hypertensive emergency.  Blood pressure in the ED was 178/95 but had been up to 207/110.  CT of head was personally reviewed and showed chronic small vessel ischemic changes but no acute abnormality.  She was not a tPA candidate as she was outside the window.  MRI of brain was personally reviewed and showed small acute right corona radiata and  centrum semiovale infarcts.  Atrophy, chronic microhemorrhages and remote lacunar infarcts involving both basal ganglia, thalami and pons also noted.  MRA of head was personally reviewed and showed no emergent large vessel occlusion or stenosis.  Carotid doppler revealed 1-39% bilateral ICA stenosis with antegrade flow of both vertebral arteries.  2D Echo showed EF above 65% with no source of embolus.  LDL was 99.  Hgb A1c was 6.2.  Blood pressure medications were held for permissive hypertension.  Upon discharge, lisinopril and amlodipine were resumed.  Clonidine was discontinued.  She reportedly had not been on antiplatelet therapy and was started on ASA 81mg  daily.  However, she reports to me that she had been taking one ASA daily.  She was continued on Lipitor 80mg  daily.    She has residual left sided weakness.  When she is watching TV, she sometimes sees double.  She still has intermittent headaches, moderate right occipital pounding that radiates down the right posterior neck.  There is associated nausea, photophobia and phonophobia.  It lasts 1 to 2 hours and has been occurring once every 2 days.  She has not been treating it with analgesics.    Observations/Objective:   There were no vitals taken for this visit. No acute distress.  Alert and oriented.  Speech fluent and not dysarthric.  Language intact.    Assessment and Plan:   1.  Small right corona radiata and centrum semiovale ischemic infarcts secondary to small vessel disease. 2.  Hypertension 3.  Hyperlipidemia 4.  History of PRES  1.  Continue ASA 81mg  daily for secondary stroke prevention 2.  Continue Lipitor 80mg  daily.  LDL  goal less than 70.   3.  Optimize blood pressure control.  PCP may want to consider workup for pheochromocytoma if not yet performed.  Given her CKD and uncontrolled blood pressure, may want to consider referral to nephrology as well. 4.  Optimize glycemic control.  Hgb A1c goal less than 7.0 5.  Weight  loss/exercise/diet 6.  Follow up in 7 months.    Follow Up Instructions:    -I discussed the assessment and treatment plan with the patient. The patient was provided an opportunity to ask questions and all were answered. The patient agreed with the plan and demonstrated an understanding of the instructions.   The patient was advised to call back or seek an in-person evaluation if the symptoms worsen or if the condition fails to improve as anticipated.    Total Time spent in visit with the patient was: 17 minutes  Dudley Major, DO

## 2018-09-11 ENCOUNTER — Other Ambulatory Visit: Payer: Self-pay

## 2018-09-11 ENCOUNTER — Telehealth (INDEPENDENT_AMBULATORY_CARE_PROVIDER_SITE_OTHER): Payer: Medicaid Other | Admitting: Neurology

## 2018-09-11 ENCOUNTER — Encounter: Payer: Self-pay | Admitting: Neurology

## 2018-09-11 DIAGNOSIS — I69354 Hemiplegia and hemiparesis following cerebral infarction affecting left non-dominant side: Secondary | ICD-10-CM | POA: Diagnosis not present

## 2018-09-11 DIAGNOSIS — I6783 Posterior reversible encephalopathy syndrome: Secondary | ICD-10-CM

## 2018-10-11 MED FILL — FUROSEMIDE 40 MG TAB: 40 | 30 days supply | Qty: 30 | Fill #0

## 2018-10-12 ENCOUNTER — Telehealth: Payer: Self-pay

## 2018-10-12 MED FILL — VENTOLIN HFA 90 MCG INHALER: 108 (90 BAS | 25 days supply | Qty: 18 | Fill #1

## 2018-10-12 NOTE — Telephone Encounter (Signed)
Patient called and would like a referral to an eye doctor.

## 2018-10-20 MED FILL — FUROSEMIDE 40 MG TAB: 40 | 30 days supply | Qty: 30 | Fill #0

## 2018-10-27 ENCOUNTER — Telehealth: Payer: Self-pay

## 2018-10-27 NOTE — Telephone Encounter (Signed)
Completed SCAT application faxed to SCAT eligibility.  

## 2018-11-03 MED FILL — SPIRONOLACTONE 25 MG TABLET: 25 | 30 days supply | Qty: 30 | Fill #0

## 2018-11-13 ENCOUNTER — Ambulatory Visit: Payer: Medicaid Other | Admitting: Nurse Practitioner

## 2018-11-13 ENCOUNTER — Ambulatory Visit: Payer: Medicaid Other | Attending: Nurse Practitioner | Admitting: Nurse Practitioner

## 2018-11-13 ENCOUNTER — Encounter: Payer: Self-pay | Admitting: Nurse Practitioner

## 2018-11-13 ENCOUNTER — Ambulatory Visit (HOSPITAL_BASED_OUTPATIENT_CLINIC_OR_DEPARTMENT_OTHER): Payer: Medicaid Other | Admitting: Pharmacist

## 2018-11-13 ENCOUNTER — Other Ambulatory Visit: Payer: Self-pay

## 2018-11-13 VITALS — BP 168/97 | HR 88 | Temp 98.8°F | Ht 64.0 in | Wt 287.6 lb

## 2018-11-13 DIAGNOSIS — E7800 Pure hypercholesterolemia, unspecified: Secondary | ICD-10-CM | POA: Diagnosis not present

## 2018-11-13 DIAGNOSIS — Z7982 Long term (current) use of aspirin: Secondary | ICD-10-CM | POA: Diagnosis not present

## 2018-11-13 DIAGNOSIS — N3946 Mixed incontinence: Secondary | ICD-10-CM

## 2018-11-13 DIAGNOSIS — R531 Weakness: Secondary | ICD-10-CM | POA: Diagnosis not present

## 2018-11-13 DIAGNOSIS — Z8249 Family history of ischemic heart disease and other diseases of the circulatory system: Secondary | ICD-10-CM | POA: Insufficient documentation

## 2018-11-13 DIAGNOSIS — N183 Chronic kidney disease, stage 3 unspecified: Secondary | ICD-10-CM

## 2018-11-13 DIAGNOSIS — Z1231 Encounter for screening mammogram for malignant neoplasm of breast: Secondary | ICD-10-CM | POA: Diagnosis not present

## 2018-11-13 DIAGNOSIS — R51 Headache: Secondary | ICD-10-CM | POA: Insufficient documentation

## 2018-11-13 DIAGNOSIS — M7989 Other specified soft tissue disorders: Secondary | ICD-10-CM | POA: Diagnosis not present

## 2018-11-13 DIAGNOSIS — R7303 Prediabetes: Secondary | ICD-10-CM

## 2018-11-13 DIAGNOSIS — R350 Frequency of micturition: Secondary | ICD-10-CM | POA: Diagnosis not present

## 2018-11-13 DIAGNOSIS — Z7901 Long term (current) use of anticoagulants: Secondary | ICD-10-CM | POA: Insufficient documentation

## 2018-11-13 DIAGNOSIS — Z8673 Personal history of transient ischemic attack (TIA), and cerebral infarction without residual deficits: Secondary | ICD-10-CM

## 2018-11-13 DIAGNOSIS — I10 Essential (primary) hypertension: Secondary | ICD-10-CM

## 2018-11-13 DIAGNOSIS — M79662 Pain in left lower leg: Secondary | ICD-10-CM | POA: Diagnosis not present

## 2018-11-13 DIAGNOSIS — Z79899 Other long term (current) drug therapy: Secondary | ICD-10-CM | POA: Diagnosis not present

## 2018-11-13 DIAGNOSIS — Z23 Encounter for immunization: Secondary | ICD-10-CM | POA: Diagnosis not present

## 2018-11-13 DIAGNOSIS — E782 Mixed hyperlipidemia: Secondary | ICD-10-CM | POA: Diagnosis not present

## 2018-11-13 DIAGNOSIS — Z6841 Body Mass Index (BMI) 40.0 and over, adult: Secondary | ICD-10-CM | POA: Diagnosis not present

## 2018-11-13 DIAGNOSIS — R232 Flushing: Secondary | ICD-10-CM | POA: Diagnosis not present

## 2018-11-13 DIAGNOSIS — D573 Sickle-cell trait: Secondary | ICD-10-CM | POA: Diagnosis not present

## 2018-11-13 LAB — POCT URINALYSIS DIP (CLINITEK)
Bilirubin, UA: NEGATIVE
Glucose, UA: NEGATIVE mg/dL
Ketones, POC UA: NEGATIVE mg/dL
Nitrite, UA: NEGATIVE
POC PROTEIN,UA: 100 — AB
Spec Grav, UA: 1.02 (ref 1.010–1.025)
Urobilinogen, UA: 0.2 E.U./dL
pH, UA: 7.5 (ref 5.0–8.0)

## 2018-11-13 LAB — POCT GLYCOSYLATED HEMOGLOBIN (HGB A1C): Hemoglobin A1C: 5.9 % — AB (ref 4.0–5.6)

## 2018-11-13 LAB — GLUCOSE, POCT (MANUAL RESULT ENTRY): POC Glucose: 111 mg/dl — AB (ref 70–99)

## 2018-11-13 MED ORDER — BLOOD PRESSURE MONITOR DEVI: 0 refills | Status: AC

## 2018-11-13 MED ORDER — SPIRONOLACTONE 25 MG PO TABS: 25.0000 mg | ORAL_TABLET | Freq: Every day | ORAL | 3 refills | Status: DC

## 2018-11-13 MED ORDER — CLONIDINE HCL 0.1 MG PO TABS
0.2000 mg | ORAL_TABLET | Freq: Once | ORAL | Status: AC
  Administered 2018-11-13: 0.2 mg via ORAL

## 2018-11-13 MED ORDER — AMLODIPINE BESYLATE 10 MG PO TABS: 10.0000 mg | ORAL_TABLET | Freq: Every day | ORAL | 2 refills | Status: DC

## 2018-11-13 MED ORDER — PAROXETINE HCL 10 MG PO TABS: 10.0000 mg | ORAL_TABLET | Freq: Every day | ORAL | 0 refills | Status: DC

## 2018-11-13 MED ORDER — MISC. DEVICES MISC: 0 refills | Status: AC

## 2018-11-13 MED ORDER — LISINOPRIL 40 MG PO TABS: 40.0000 mg | ORAL_TABLET | Freq: Every day | ORAL | 3 refills | Status: DC

## 2018-11-13 MED ORDER — ATORVASTATIN CALCIUM 80 MG PO TABS: 80.0000 mg | ORAL_TABLET | Freq: Every day | ORAL | 2 refills | Status: DC

## 2018-11-13 MED FILL — LISINOPRIL 40 MG TABLET: 40 | 90 days supply | Qty: 90 | Fill #0

## 2018-11-13 MED FILL — PAROXETINE HCL 10 MG TABS: 10 | 90 days supply | Qty: 90 | Fill #0

## 2018-11-13 MED FILL — AMLODIPINE BESYLATE 10 MG T: 10 | 90 days supply | Qty: 90 | Fill #0

## 2018-11-13 MED FILL — ATORVASTATIN 80 MG TABLET: 80 | 30 days supply | Qty: 30 | Fill #0

## 2018-11-13 NOTE — Progress Notes (Signed)
Patient presents for vaccination against influenza per orders of Zelda. Consent given. Counseling provided. No contraindications exists. Vaccine administered without incident.   

## 2018-11-13 NOTE — Progress Notes (Signed)
Assessment & Plan:  Belinda Ramirez was seen today for follow-up.  Diagnoses and all orders for this visit:  Poorly-controlled hypertension -     amLODipine (NORVASC) 10 MG tablet; Take 1 tablet (10 mg total) by mouth daily. -     Ambulatory referral to Ophthalmology -     lisinopril (ZESTRIL) 40 MG tablet; Take 1 tablet (40 mg total) by mouth daily. -     Blood Pressure Monitor DEVI; Please provide patient with insurance approved blood pressure monitor -     cloNIDine (CATAPRES) tablet 0.2 mg -     POCT URINALYSIS DIP (CLINITEK) -     spironolactone (ALDACTONE) 25 MG tablet; Take 1 tablet (25 mg total) by mouth daily. Continue all antihypertensives as prescribed.  Remember to bring in your blood pressure log with you for your follow up appointment.  DASH/Mediterranean Diets are healthier choices for HTN.    Prediabetes -     Glucose (CBG) -     HgB A1c Continue blood sugar control as discussed in office today, low carbohydrate diet, and regular physical exercise as tolerated, 150 minutes per week (30 min each day, 5 days per week, or 50 min 3 days per week).  Lab Results  Component Value Date   HGBA1C 5.9 (A) 11/13/2018    Mixed hyperlipidemia -     Lipid panel -     atorvastatin (LIPITOR) 80 MG tablet; Take 1 tablet (80 mg total) by mouth daily at 6 PM. INSTRUCTIONS: Work on a low fat, heart healthy diet and participate in regular aerobic exercise program by working out at least 150 minutes per week; 5 days a week-30 minutes per day. Avoid red meat, fried foods. junk foods, sodas, sugary drinks, unhealthy snacking, alcohol and smoking.  Drink at least 48oz of water per day and monitor your carbohydrate intake daily.  Lab Results  Component Value Date   LDLCALC 99 03/29/2018   CKD (chronic kidney disease), stage III (HCC) -     CBC -     CMP14+EGFR DASH Diet Avoid NSAIDs   Breast cancer screening by mammogram -     MM 3D SCREEN BREAST BILATERAL; Future  History of CVA  (cerebrovascular accident) -     Ambulatory referral to Ophthalmology  Pain and swelling of left lower leg -     Misc. Devices MISC; Please provide patient with insurance approved thigh high compression stockings for swelling. Continue furosemide as prescribed DASH Diet  Hot flashes -     PARoxetine (PAXIL) 10 MG tablet; Take 1 tablet (10 mg total) by mouth daily.  Mixed stress and urge urinary incontinence -     Ambulatory referral to Physical Therapy -     Ambulatory referral to Urology    Patient has been counseled on age-appropriate routine health concerns for screening and prevention. These are reviewed and up-to-date. Referrals have been placed accordingly. Immunizations are up-to-date or declined.    Subjective:   Chief Complaint  Patient presents with   Follow-up    Pt. is no longer having a knot on her right breast. Pt. stated she need to talk about her bladder, not being able to hold her urine well before she reach the bathroom .    HPI Belinda Ramirez 43 y.o. female presents to office today with complaints of incontinence.   Essential Hypertension Blood pressure is chronically elevated. She endorses medication compliance taking amlodipine '10mg'$  and lisinopril 40 mg daily. She was recently started on an additional blood  pressure medication that she can not recall the name of. However unfortunately she has not taken this medication today. Per pharmacy the medication is spironolactone 25 mg daily. I have instructed Belinda Ramirez that she should be taking all 3 medications as prescribed. Will recheck blood pressure at next office visit for PAP smear. She endorses BLE swelling. Was started on temp dose of furosemide by her podiatrist. She has poor dietary habits in regard to excessive sodium intake. I have also ordered her compression stockings in the past which she does not have on today. States they made her legs itch. Denies chest pain, shortness of breath, palpitations,  lightheadedness, dizziness. She does experience intermittent headaches and also needs to follow up with and ophthalmologist.  BP Readings from Last 3 Encounters:  11/13/18 (!) 168/97  05/11/18 (!) 190/112  04/14/18 (!) 153/81    Urinary Incontinence: Patient complains of urinary incontinence. This has been present on and off for several years. She leaks urine with standing, walking, with urge. Patient describes the symptoms as  frequent urination (severalx per day), urge to urinate with little or no warning, urine leakage with coughing/heavy physical activity and urine leaking unpredictably.  Factors associated with symptoms include medications, morbid obesity with BMI 49. Evaluation to date includes UA/CS: normal and referral to physical therapy for pelvic floor therapy however she was not able to follow up with PT. Treatment to date includes none.  She has mixed urinary incontinence with urge predominant and will require pelvic floor muscle exercises. She has also seen a Urologist in the past and states she has no idea no idea what they said or what her treatment was supposed to be.   Review of Systems  Constitutional: Negative for fever, malaise/fatigue and weight loss.       HOT FLASHES  HENT: Negative.  Negative for nosebleeds.   Eyes: Negative.  Negative for blurred vision, double vision and photophobia.  Respiratory: Negative.  Negative for cough, shortness of breath and wheezing.   Cardiovascular: Positive for leg swelling. Negative for chest pain and palpitations.  Gastrointestinal: Negative.  Negative for heartburn, nausea and vomiting.  Genitourinary: Positive for frequency and urgency. Negative for dysuria, flank pain and hematuria.       SEE HPI  Musculoskeletal: Negative.  Negative for myalgias.  Neurological: Positive for weakness. Negative for dizziness, focal weakness, seizures and headaches.  Psychiatric/Behavioral: Positive for depression. Negative for suicidal ideas.     Past Medical History:  Diagnosis Date   High cholesterol    History of cerebral hemorrhage 04/2015   2x2cm hemorrhage in right basal ganglia related to hypertensive parynchemal hemorrhage   History of cerebral infarction 04/13/2016   Dont see evidence for thrombosis but do see prior hx of CVAs   Hypertension    Obesity    Sickle cell trait (HCC)    Stroke ([place])    Urinary incontinence    For years. Resolved 5/19 spontaneously    Past Surgical History:  Procedure Laterality Date   TUBAL LIGATION     WISDOM TOOTH EXTRACTION      Family History  Problem Relation Age of Onset   Heart attack Maternal Grandfather    Diabetes Mother    Hypertension Mother    Cancer Father    Heart disease Maternal Grandmother    Diabetes Sister    Diabetes Brother     Social History Reviewed with no changes to be made today.   Outpatient Medications Prior to Visit  Medication Sig Dispense  Refill   Accu-Chek FastClix Lancets MISC Use as instructed. Inject into the skin twice daily 100 each 3   albuterol (PROVENTIL HFA;VENTOLIN HFA) 108 (90 Base) MCG/ACT inhaler Inhale 2 puffs into the lungs every 6 (six) hours as needed for wheezing or shortness of breath. 1 Inhaler 2   aspirin EC 81 MG EC tablet Take 1 tablet (81 mg total) by mouth daily. 30 tablet 1   Blood Glucose Calibration (ACCU-CHEK GUIDE CONTROL) LIQD 1 each by In Vitro route once as needed for up to 1 dose. 1 each 0   fluticasone (FLONASE) 50 MCG/ACT nasal spray Place 2 sprays into both nostrils daily. 16 g 6   glucose blood (ACCU-CHEK GUIDE) test strip Use as instructed. Check blood glucose by fingerstick twice per day. 100 each 12   amLODipine (NORVASC) 10 MG tablet Take 1 tablet (10 mg total) by mouth daily. 30 tablet 2   atorvastatin (LIPITOR) 80 MG tablet Take 1 tablet (80 mg total) by mouth daily at 6 PM. 30 tablet 2   Misc. Devices MISC Please provide patient with insurance approved thigh high  compression stockings for swelling. 1 each 0   acetaminophen (TYLENOL) 500 MG tablet Take 2 tablets (1,000 mg total) by mouth every 6 (six) hours as needed. (Patient not taking: Reported on 04/11/2018) 30 tablet 0   ferrous sulfate 325 (65 FE) MG EC tablet Take 1 tablet (325 mg total) by mouth 2 (two) times daily. 60 tablet 3   lisinopril (PRINIVIL,ZESTRIL) 40 MG tablet Take 1 tablet (40 mg total) by mouth daily. 90 tablet 3   SUMAtriptan (IMITREX) 100 MG tablet Take 0.5 tablets (50 mg total) by mouth daily as needed for migraine. May repeat in 2 hours if headache persists or recurs. 15 tablet 0   No facility-administered medications prior to visit.     Allergies  Allergen Reactions   Oxycodone Hives   Percocet [Oxycodone-Acetaminophen] Hives   Amoxicillin Hives    Has patient had a PCN reaction causing immediate rash, facial/tongue/throat swelling, SOB or lightheadedness with hypotension: No Has patient had a PCN reaction causing severe rash involving mucus membranes or skin necrosis: No Has patient had a PCN reaction that required hospitalization: No Has patient had a PCN reaction occurring within the last 10 years: Yes If all of the above answers are "NO", then may proceed with Cephalosporin use.    Aspirin Other (See Comments)    Hx of hemorrhagic stroke (pt does take low dose aspirin )   Clonidine Derivatives    Latex Itching       Objective:    BP (!) 168/97 (BP Location: Right Arm, Patient Position: Sitting, Cuff Size: Large)    Pulse 88    Temp 98.8 F (37.1 C) (Oral)    Ht '5\' 4"'$  (1.626 m)    Wt 287 lb 9.6 oz (130.5 kg)    SpO2 97%    BMI 49.37 kg/m  Wt Readings from Last 3 Encounters:  11/13/18 287 lb 9.6 oz (130.5 kg)  05/11/18 282 lb (127.9 kg)  04/11/18 281 lb 3.2 oz (127.6 kg)    Physical Exam Vitals signs and nursing note reviewed.  Constitutional:      Appearance: She is well-developed.  HENT:     Head: Normocephalic and atraumatic.  Neck:      Musculoskeletal: Normal range of motion.  Cardiovascular:     Rate and Rhythm: Normal rate and regular rhythm.     Heart sounds: Normal heart sounds.  No murmur. No friction rub. No gallop.   Pulmonary:     Effort: Pulmonary effort is normal. No tachypnea or respiratory distress.     Breath sounds: Normal breath sounds. No decreased breath sounds, wheezing, rhonchi or rales.  Chest:     Chest wall: No tenderness.  Abdominal:     General: Bowel sounds are normal.     Palpations: Abdomen is soft.  Musculoskeletal: Normal range of motion.     Right lower leg: Edema present.     Left lower leg: Edema present.  Skin:    General: Skin is warm and dry.  Neurological:     Mental Status: She is alert and oriented to person, place, and time.     Coordination: Coordination abnormal (history of stroke. Uses straight Cane).     Gait: Gait abnormal (uses cane for assistance with mobility).  Psychiatric:        Behavior: Behavior normal. Behavior is cooperative.        Thought Content: Thought content normal.        Judgment: Judgment normal.        Patient has been counseled extensively about nutrition and exercise as well as the importance of adherence with medications and regular follow-up. The patient was given clear instructions to go to ER or return to medical center if symptoms don't improve, worsen or new problems develop. The patient verbalized understanding.   Follow-up: Return for PAP SMEAR.   Gildardo Pounds, FNP-BC [facility] and [place], [place]   11/13/2018, 1:14 PM

## 2018-11-28 ENCOUNTER — Ambulatory Visit (INDEPENDENT_AMBULATORY_CARE_PROVIDER_SITE_OTHER): Payer: Medicaid Other | Admitting: Family Medicine

## 2018-11-28 ENCOUNTER — Other Ambulatory Visit: Payer: Self-pay

## 2018-11-28 VITALS — Ht 64.0 in | Wt 290.0 lb

## 2018-11-28 DIAGNOSIS — R7303 Prediabetes: Secondary | ICD-10-CM

## 2018-11-29 NOTE — Progress Notes (Signed)
Encounter opened in error. Patient established with PCP.    Donia Pounds  APRN, MSN, FNP-C Patient [place] [address] [place], [place] [phone]

## 2018-11-30 MED FILL — traZODone HCL 50 MG TABS: 50 | 30 days supply | Qty: 15 | Fill #0

## 2018-11-30 MED FILL — ESCITALOPRAM 10 MG TABLET: 10 | 30 days supply | Qty: 30 | Fill #0

## 2018-12-06 ENCOUNTER — Encounter: Payer: Self-pay | Admitting: Physical Therapy

## 2018-12-06 ENCOUNTER — Ambulatory Visit: Payer: Medicaid Other | Attending: Nurse Practitioner | Admitting: Physical Therapy

## 2018-12-06 ENCOUNTER — Other Ambulatory Visit: Payer: Self-pay

## 2018-12-06 DIAGNOSIS — M6281 Muscle weakness (generalized): Secondary | ICD-10-CM | POA: Insufficient documentation

## 2018-12-06 DIAGNOSIS — R279 Unspecified lack of coordination: Secondary | ICD-10-CM | POA: Diagnosis present

## 2018-12-06 NOTE — Therapy (Signed)
[facility] [address], [place], [place], [address] Phone: [phone]   Fax:  [phone]  Physical Therapy Evaluation  Patient Details  Name: Belinda Ramirez MRN: [id number] Date of Birth: 1975-09-30 Referring Provider (PT): Gildardo Pounds, NP   Encounter Date: 12/06/2018  PT End of Session - 12/06/18 0932    Visit Number  1    Date for PT Re-Evaluation  01/31/19    Authorization Type  Medicaid, submitting for auth 12/06/18    PT Start Time  0932    PT Stop Time  1033    PT Time Calculation (min)  61 min    Equipment Utilized During Treatment  Other (comment)   straight cane   Activity Tolerance  Patient tolerated treatment well       Past Medical History:  Diagnosis Date  . High cholesterol   . 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 Hypertension   . Obesity   . Sickle cell trait ([place])   . Stroke ([place])   . Urinary incontinence    For years. Resolved 5/19 spontaneously    Past Surgical History:  Procedure Laterality Date  . TUBAL LIGATION    . WISDOM TOOTH EXTRACTION      There were no vitals filed for this visit.   Subjective Assessment - 12/06/18 0937    Subjective  Pt referred to PT with mixed incontinence which has worsened over the last few years.  Pt has episodes of leakage every day.  Pt consistently leaks when walking 1 block to bus stop and while grocery shopping.  Pt reports loss of urine soaks underwear.  Wears depends daily.  PM voiding is 4-5 times and leaks about 50% of time when trying to get to bathroom.  Pt uses bathroom every 45 min during the day.    Pertinent History  morbid obesity BMI 49, History of CVA affecting Lt non-dominant side, chronic kidney disease Stage III, HTN,  Prediabetes, thigh high compression stockings (doesn't have any right now, Rt leg swells)    Limitations  Standing;Walking;House hold activities;Lifting    How long can you stand comfortably?  leaks throughout day with standing tasks    How long can you walk comfortably?  1 block = leakage    Patient Stated Goals  less leakage over 24 hour period, "be able to walk in heels again"    Currently in Pain?  No/denies         [facility] PT Assessment - 12/06/18 0001      Assessment   Medical Diagnosis  N39.46 (ICD-10-CM) - Mixed stress and urge urinary incontinence    Referring Provider (PT)  Gildardo Pounds, NP    Onset Date/Surgical Date  --   several years ago   Hand Dominance  Right   CVA affecting Lt side   Next MD Visit  12/25/18    Prior Therapy  no      Precautions   Precautions  Fall      Restrictions   Weight Bearing Restrictions  No      Balance Screen   Has the patient fallen in the past 6 months  Yes    How many times?  6    Has the patient had a decrease in activity level  because of a fear of falling?   Yes    Is the patient reluctant to leave their home because of a fear of falling?   Yes      [place]  Private residence    Living Arrangements  Alone    Type of [place]  One level    [place] - single point   rollator     Prior Function   Level of Independence  Independent with household mobility with device;Independent with community mobility with device    Vocation  Unemployed   in process of disability   Leisure  knitting      Cognition   Overall Cognitive Status  Within Functional Limits for tasks assessed      Observation/Other Assessments   Observations  Pt not wearing Depends, underwear soaked through      Posture/Postural Control   Posture/Postural Control  Postural limitations    Postural Limitations  Weight shift right;Increased lumbar lordosis;Forward head;Rounded Shoulders;Anterior  pelvic tilt      ROM / Strength   AROM / PROM / Strength  AROM;Strength      AROM   Overall AROM Comments  trunk cardinal planes of motion signif limited by 80% without pain, stiffness present, bil hips limited 50% all planes      Strength   Overall Strength Comments  Lt LE 3/5 throughout (hemiparesis present on Lt), Rt LE 3+/5 throughout    Strength Assessment Site  --      Palpation   Palpation comment  tenderness present Lt adductors      Ambulation/Gait   Pre-Gait Activities  uses Rt UE SPC, slow, weakness on Lt side (hemiparesis from CVA), decreased weight shifting into Lt LE      High Level Balance   High Level Balance Comments  able to march in place, fair balance Rt, poor Lt in SLS                Objective measurements completed on examination: See above findings.    Pelvic Floor Special Questions - 12/06/18 0001    Prior Pelvic/Prostate Exam  Yes    Date of Last Pelvic/Prostate Exam  --   approx 2 years ago, next one in Nov 2020   Result Pelvic/Prostate Exam   normal PAP smear    Are you Pregnant or attempting pregnancy?  No    Prior Pregnancies  No    Currently Sexually Active  Yes    Is this Painful  No    History of sexually transmitted disease  Yes    Urinary Leakage  Yes    How often  6 during day, 3 at night    Pad use  consistent use of depends day/night    Activities that cause leaking  With strong urge;Sneezing;Laughing;Lifting;Bending;Walking;Exercising    Urinary urgency  Yes    Urinary frequency  every 45 min    Fecal incontinence  No    Fluid intake  unknown    Caffeine beverages  4 Pepsi/day    Falling out feeling (prolapse)  Yes    Activities that cause feeling of prolapse  consistent    External Perineal Exam  Pt gives permission for external exam    Skin Integrity  Intact    Scar  none    Perineal Body/Introitus   Descended    External Palpation  no tenderness present throughout pelvic clock  Prolapse  Other   no gross evidence  of prolapse   Pelvic Floor Internal Exam  Pt gives permission for internal exam    Exam Type  Vaginal    Sensation  intact    Palpation  non-tender    Strength  weak squeeze, no lift    Strength # of reps  4    Strength # of seconds  2       [facility] Adult PT Treatment/Exercise - 12/06/18 0001      Bed Mobility   Bed Mobility  Sit to Sidelying Left;Left Sidelying to Sit;Supine to Sit    Left Sidelying to Sit  Minimal Assistance - Patient >75%    Supine to Sit  Minimal Assistance - Patient > 75%    Sit to Sidelying Left  Set up assist      Transfers   Transfers  Sit to Stand    Sit to Stand  6: Modified independent (Device/Increase time)                  PT Long Term Goals - 12/06/18 1320      PT LONG TERM GOAL #1   Title  Pt will be ind in advanced HEP and verbalize daily compliance with pelvic floor strengthening exercises.    Baseline  no knowledge    Time  8    Period  Weeks    Status  New    Target Date  01/31/19      PT LONG TERM GOAL #2   Title  Pt will achieve pelvic floor strength of at least 3/5 to reduce leakage episodes to </= 4 episodes throughout daily tasks.    Baseline  2/5, 9+ leakage episodes per 24 hours    Time  8    Period  Weeks    Status  New    Target Date  01/31/19      PT LONG TERM GOAL #3   Title  Pt will report ability to stretch voiding times with less urgency to at least 1.5 hours between voids throughout waking hours.    Baseline  voids every 45 min or less    Time  8    Period  Weeks    Status  New    Target Date  01/31/19      PT LONG TERM GOAL #4   Title  Pt will be able to perform light household tasks, transfers and walk to bus stop with light or no leakage at least 75% of the time.    Baseline  soaks underwear with all physical movement including bending, lifting, sit to stand and walking 100% of the time    Time  8    Period  Weeks    Status  New    Target Date  01/31/19      PT LONG TERM GOAL #5   Title  Pt will  understand how to better manage fluids and reduce number of voids at night to </= 3 times to improve restful sleep.    Baseline  no knowledge, voids up to 5 times a night    Time  8    Period  Weeks    Status  New    Target Date  01/31/19             Plan - 12/06/18 1039    Clinical Impression Statement  Pt is a 43yo female with complex medical history including multiple CVAs with hemiparesis affecting Lt non-dominant  side of body.  She has moderate/high fall risk, reporting 6 falls over past 6 months.  Pt is referred to PT with history of worsening mixed incontinence x 2-3 years.  She wears Depends throughout day and night (although wasn't wearing today) and reports leakage that soaks underwear approx 9 times over 24 hour period.  She wakes to void at 4-5 times at night.  She has urge to void every 45 min and reports leakage 50% of the time on her way to the bathroom.  She also leaks with most physical movement, most notably when she has to walk 1 block to bus stop.  Her mobility is greatly limited throughout trunk and hips.  She lives a mostly stationary lifestyle and is in the process of applying for disability.  Gait is slow with Lt hemiparesis, with use of SPC in Rt UE.  Transfers are slow for sit to stand, stand to sit and all bed mobility.  PT provided assistance with LE dressing for pelvic assessment.  Pelvic region was non-tender with exception of Lt adductors.  Pelvic floor strength was 2/5 (poor) with lack of squeeze or lift on Lt side which may be related to hemiparesis from strokes.  Endurance of pelvic floor was 2 sec.  PERFECT score was 2/2/4//3.  Pt was educated about fluid management, bladder irritants, given a bladder diary and instructed on how to track fluid intake, voiding frequency and leakage over 24 hours.  Pt also given pelvic floor Kegel parameters for HEP.  PT encouraged Pt to use PF contractions during transfers, when walking to toilet, and with light physical activity to  see if she can reduce leakage frequency and urine control.  Pt will benefit from skilled PT for Pt education, strength, coordination and endurance of pelvic floor, and progression of daily activity and movement with pelvic floor muscle control.    Personal Factors and Comorbidities  Comorbidity 1;Comorbidity 2;Comorbidity 3+;Social Background;Time since onset of injury/illness/exacerbation;Transportation    Comorbidities  multiple CVAs with Lt hemiparesis, morbid obesity, kidney disease Stage III, history of LE swelling, applying for disability, cannot drive (uses [employer]AT), onset x 2-3 years ago    Examination-Activity Limitations  Locomotion Level;Transfers;Bed Mobility;Bathing;Bend;Sleep;Continence;Dressing;Stand;Lift;Hygiene/Grooming;Toileting    Examination-Participation Restrictions  Meal Prep;Community Activity;Driving;Laundry;Shop;Cleaning    Stability/Clinical Decision Making  Evolving/Moderate complexity    Clinical Decision Making  Moderate    Rehab Potential  Good    PT Frequency  1x / week    PT Duration  8 weeks    PT Treatment/Interventions  ADLs/Self Care Home Management;Biofeedback;Electrical Stimulation;Moist Heat;Gait training;Functional mobility training;Therapeutic activities;Therapeutic exercise;Neuromuscular re-education;Patient/family education;Manual techniques;Passive range of motion    PT Next Visit Plan  f/u on bladder diary, PERFECT score 2/2/4//3, Kegel compliance HEP, dietary changes (switch Pepsi for waters), re-check pelvic contraction success, progress to sitting    PT Home Exercise Plan  next visit    Consulted and Agree with Plan of Care  Patient       Patient will benefit from skilled therapeutic intervention in order to improve the following deficits and impairments:  Abnormal gait, Decreased coordination, Decreased range of motion, Difficulty walking, Impaired tone, Decreased endurance, Increased muscle spasms, Obesity, Decreased activity tolerance, Hypomobility,  Impaired flexibility, Improper body mechanics, Decreased mobility, Decreased strength, Postural dysfunction  Visit Diagnosis: Muscle weakness (generalized) - Plan: PT plan of care cert/re-cert  Unspecified lack of coordination - Plan: PT plan of care cert/re-cert     Problem List Patient Active Problem List   Diagnosis Date Noted  .  Neurological abnormality 03/28/2018  . Hemiplegia and hemiparesis following cerebral infarction affecting left non-dominant side ([place]) 02/26/2018  . Globus pharyngeus 09/26/2017  . Left leg swelling 09/26/2017  . Blurred vision 08/17/2017  . History of CVA (cerebrovascular accident) 08/17/2017  . Urinary incontinence   . Severe hypertension 07/21/2017  . PRES (posterior reversible encephalopathy syndrome) 07/11/2017  . CKD (chronic kidney disease), stage III 01/22/2017  . OSA (obstructive sleep apnea) 06/19/2015  . Morbid obesity due to excess calories ([place])   . Hyperlipidemia   . History of cerebral hemorrhage 04/16/2015    Baruch Merl, PT 12/06/18 1:36 PM   [facility] [address] [place], [place], [address] Phone: [phone]   Fax:  [phone]  Name: Belinda Ramirez MRN: [id number] Date of Birth: [birth year]

## 2018-12-18 ENCOUNTER — Ambulatory Visit: Payer: Medicaid Other | Attending: Nurse Practitioner | Admitting: Physical Therapy

## 2018-12-18 ENCOUNTER — Telehealth: Payer: Self-pay | Admitting: Physical Therapy

## 2018-12-18 DIAGNOSIS — R279 Unspecified lack of coordination: Secondary | ICD-10-CM | POA: Insufficient documentation

## 2018-12-18 DIAGNOSIS — M6281 Muscle weakness (generalized): Secondary | ICD-10-CM | POA: Insufficient documentation

## 2018-12-18 NOTE — Telephone Encounter (Signed)
LM about missed appointment on 12/18/18 and reminded her of next appointment on 12/25/18.  Asked for her to call if she needed to make changes to her schedule.  Johanna Beuhring, PT 12/18/18 12:25 PM

## 2018-12-19 ENCOUNTER — Other Ambulatory Visit: Payer: Medicaid Other | Admitting: Nurse Practitioner

## 2018-12-25 ENCOUNTER — Encounter: Payer: Self-pay | Admitting: Physical Therapy

## 2018-12-25 ENCOUNTER — Other Ambulatory Visit: Payer: Self-pay

## 2018-12-25 ENCOUNTER — Ambulatory Visit: Payer: Medicaid Other | Admitting: Physical Therapy

## 2018-12-25 DIAGNOSIS — M6281 Muscle weakness (generalized): Secondary | ICD-10-CM

## 2018-12-25 DIAGNOSIS — R279 Unspecified lack of coordination: Secondary | ICD-10-CM | POA: Diagnosis present

## 2018-12-25 NOTE — Patient Instructions (Signed)
Toileting Techniques for Bowel Movements (Defecation) Using your belly (abdomen) and pelvic floor muscles to have a bowel movement is usually instinctive.  Sometimes people can have problems with these muscles and have to relearn proper defecation (emptying) techniques.  If you have weakness in your muscles, organs that are falling out, decreased sensation in your pelvis, or ignore your urge to go, you may find yourself straining to have a bowel movement.  You are straining if you are: . holding your breath or taking in a huge gulp of air and holding it  . keeping your lips and jaw tensed and closed tightly . turning red in the face because of excessive pushing or forcing . developing or worsening your  hemorrhoids . getting faint while pushing . not emptying completely and have to defecate many times a day  If you are straining, you are actually making it harder for yourself to have a bowel movement.  Many people find they are pulling up with the pelvic floor muscles and closing off instead of opening the anus. Due to lack pelvic floor relaxation and coordination the abdominal muscles, one has to work harder to push the feces out.  Many people have never been taught how to defecate efficiently and effectively.  Notice what happens to your body when you are having a bowel movement.  While you are sitting on the toilet pay attention to the following areas: . Jaw and mouth position . Angle of your hips   . Whether your feet touch the ground or not . Arm placement  . Spine position . Waist . Belly tension . Anus (opening of the anal canal)  An Evacuation/Defecation Plan   Here are the 4 basic points:  1. Lean forward enough for your elbows to rest on your knees 2. Support your feet on the floor or use a low stool if your feet don't touch the floor  3. Push out your belly as if you have swallowed a beach ball-you should feel a widening of your waist 4. Open and relax your pelvic floor muscles,  rather than tightening around the anus       The following conditions my require modifications to your toileting posture:  . If you have had surgery in the past that limits your back, hip, pelvic, knee or ankle flexibility . Constipation   Your healthcare practitioner may make the following additional suggestions and adjustments:  1) Sit on the toilet  a) Make sure your feet are supported. b) Notice your hip angle and spine position-most people find it effective to lean forward or raise their knees, which can help the muscles around the anus to relax  c) When you lean forward, place your forearms on your thighs for support  2) Relax suggestions a) Breath deeply in through your nose and out slowly through your mouth as if you are smelling the flowers and blowing out the candles. b) To become aware of how to relax your muscles, contracting and releasing muscles can be helpful.  Pull your pelvic floor muscles in tightly by using the image of holding back gas, or closing around the anus (visualize making a circle smaller) and lifting the anus up and in.  Then release the muscles and your anus should drop down and feel open. Repeat 5 times ending with the feeling of relaxation. c) Keep your pelvic floor muscles relaxed; let your belly bulge out. d) The digestive tract starts at the mouth and ends at the anal opening, so   be sure to relax both ends of the tube.  Place your tongue on the roof of your mouth with your teeth separated.  This helps relax your mouth and will help to relax the anus at the same time.  3) Empty (defecation) a) Keep your pelvic floor and sphincter relaxed, then bulge your anal muscles.  Make the anal opening wide.  b) Stick your belly out as if you have swallowed a beach ball. c) Make your belly wall hard using your belly muscles while continuing to breathe. Doing this makes it easier to open your anus. d) Breath out and give a grunt (or try using other sounds such as  ahhhh, shhhhh, ohhhh or grrrrrrr).  4) Finish a) As you finish your bowel movement, pull the pelvic floor muscles up and in.  This will leave your anus in the proper place rather than remaining pushed out and down. If you leave your anus pushed out and down, it will start to feel as though that is normal and give you incorrect signals about needing to have a bowel movement.  Relaxation Exercises with the Urge to Void   When you experience an urge to void:  FIRST  Stop and stand very still    Sit down if you can    Don't move    You need to stay very still to maintain control  SECOND Squeeze your pelvic floor muscles 5 times, like a quick flick, to keep from leaking  THIRD Relax  Take a deep breath and then let it out  Try to make the urge go away by using relaxation and visualization techniques  FINALLY When you feel the urge go away somewhat, walk normally to the bathroom.   If the urge gets suddenly stronger on the way, you may stop again and relax to regain control.

## 2018-12-25 NOTE — Therapy (Addendum)
[facility] [address], [place], [place], [address] Phone: [phone]   Fax:  [phone]  Physical Therapy Treatment  Patient Details  Name: Nakina Spatz MRN: [id number] Date of Birth: 1976/01/24 Referring Provider (PT): Gildardo Pounds, NP   Encounter Date: 12/25/2018  PT End of Session - 12/25/18 1254    Visit Number  2    Date for PT Re-Evaluation  01/31/19    Authorization Type  3 units approved through 18/56/31, Epic re-cert 49/70/26    PT Start Time  1105    PT Stop Time  1148    PT Time Calculation (min)  43 min    Activity Tolerance  Patient tolerated treatment well    Behavior During Therapy  [facility] for tasks assessed/performed       Past Medical History:  Diagnosis Date  . High cholesterol   . 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 Hypertension   . Obesity   . Sickle cell trait ([place])   . Stroke ([place])   . Urinary incontinence    For years. Resolved 5/19 spontaneously    Past Surgical History:  Procedure Laterality Date  . TUBAL LIGATION    . WISDOM TOOTH EXTRACTION      There were no vitals filed for this visit.  Subjective Assessment - 12/25/18 1110    Subjective  I have been doing the exercises but didn't do the bladder log.  I go to the bathroom less often (every hour now vs every 45 min).  I can now squeeze my muscles and don't leak as much but I still leak frequently.  About 50% less frequency of leakage and underwear isn't as soaked.  Night voiding is down from 5 to 4 times at night but still leak sometimes on the way.  I feel like something is falling out of my vagina after I urinate.    Pertinent History  morbid obesity BMI 49, History of CVA affecting Lt non-dominant side, chronic  kidney disease Stage III, HTN, Prediabetes, thigh high compression stockings (doesn't have any right now, Rt leg swells)    How long can you stand comfortably?  leaks throughout day with standing tasks    How long can you walk comfortably?  1 block = leakage    Patient Stated Goals  less leakage over 24 hour period, "be able to walk in heels again"    Currently in Pain?  No/denies                    Pelvic Floor Special Questions - 12/25/18 0001    Pelvic Floor Internal Exam  Pt gives permission for internal exam    Exam Type  Vaginal    Sensation  intact    Palpation  non-tender    Strength  weak squeeze, no lift   more uniform squeeze and 2+/5 with addition of ball squeeze   Strength # of reps  4    Strength # of seconds  5        [facility] Adult PT Treatment/Exercise - 12/25/18 0001      Neuro Re-ed    Neuro Re-ed Details   PT manual quick stretch and tapping Lt pubococcygeus and levator ani to enhance recruitment of PF in supine with  hips propped with pillow 10x5 sec, 3x5 quick flicks, seated on towel roll for seated contraction and with sit to stand      Exercises   Exercises  Lumbar;Knee/Hip      Lumbar Exercises: Supine   Other Supine Lumbar Exercises  PF squeeze with and without ball squeeze (see neuro re-ed for PF reps)      Knee/Hip Exercises: Seated   Ball Squeeze  5x5 sec with PF contraction, seated on towel roll    Sit to Sand  5 reps;Other (comment)   seated on doubled towel roll for biofeedback, VCs PF            PT Education - 12/25/18 1152    Education Details  Access Code: [email or web address]  Seated Hip Adduction Isometrics with Ball Sit to Stand with Pelvic Floor Contraction- 10 reps- 3 sets- 5 hold Supine Hip Adduction Isometric with Ball - PROP HIPS WITH PILLOW    Person(s) Educated  Patient    Methods  Explanation;Handout    Comprehension  Verbalized understanding;Returned demonstration          PT Long Term Goals - 12/25/18 1303      PT  LONG TERM GOAL #1   Title  Pt will be ind in advanced HEP and verbalize daily compliance with pelvic floor strengthening exercises.    Baseline  compliant with initial HEP    Status  On-going      PT LONG TERM GOAL #2   Title  Pt will achieve pelvic floor strength of at least 3/5 to reduce leakage episodes to </= 4 episodes throughout daily tasks.    Baseline  2/5, less quantity of leakage but still consistently leaking    Status  On-going      PT LONG TERM GOAL #3   Title  Pt will report ability to stretch voiding times with less urgency to at least 1.5 hours between voids throughout waking hours.    Baseline  voids approx every hour vs every 45 min now    Status  On-going      PT LONG TERM GOAL #4   Title  Pt will be able to perform light household tasks, transfers and walk to bus stop with light or no leakage at least 75% of the time.    Status  On-going      PT LONG TERM GOAL #5   Title  Pt will understand how to better manage fluids and reduce number of voids at night to </= 3 times to improve restful sleep.    Baseline  voids 4x/night vs 5x    Status  On-going            Plan - 12/25/18 1255    Clinical Impression Statement  Pt returns for first treatment session after initial evaluation.  She reports compliance with reducing soda intake and replacing with water as well as working on PF exercises.  She reports reduction in urgency/frequency by 50% and less quantity and frequency of leakage.  PF strength remains a 2/5 but improved with addition of adductor ball squeeze.  Lt PF recruitment is not as consistent likely secondary to strokes affecting Lt side but PF contraction was more uniform and 2+/5 with addition of manual tactile cues internally and addition of ball squeeze.  PT progressed HEP to include seated contractions, sit to stand with use of towel roll sitting for biofeedback, and supine ball squeeze with hips propped to enhance PF contraction with gravity assist.  PT  encouraged Pt to aim for tracking toileting and amount of leakage at least 1 day this week.  PT also taught urge drills and gave toileting techniques to reduce straining on toilet during BMs.  Pt will continue to benefit from skilled PT to address weakness of PF and improve control for more independence in the community.    Comorbidities  multiple CVAs with Lt hemiparesis, morbid obesity, kidney disease Stage III, history of LE swelling, applying for disability, cannot drive (uses [employer]), onset x 2-3 years ago    Examination-Activity Limitations  Locomotion Level;Transfers;Bed Mobility;Bathing;Bend;Sleep;Continence;Dressing;Stand;Lift;Hygiene/Grooming;Toileting    Stability/Clinical Decision Making  Evolving/Moderate complexity    Rehab Potential  Good    PT Frequency  1x / week    PT Duration  8 weeks    PT Treatment/Interventions  ADLs/Self Care Home Management;Biofeedback;Electrical Stimulation;Moist Heat;Gait training;Functional mobility training;Therapeutic activities;Therapeutic exercise;Neuromuscular re-education;Patient/family education;Manual techniques;Passive range of motion    PT Next Visit Plan  f/u on sit to stand with towel roll and ball squeeze, add standing march, recheck PF strength/recruitment with hips propped and ball squeeze, f/u on freq, urge drill, toileting tech    PT Home Exercise Plan  Access Code: [email or web address]    Consulted and Agree with Plan of Care  Patient       Patient will benefit from skilled therapeutic intervention in order to improve the following deficits and impairments:     Visit Diagnosis: Muscle weakness (generalized)  Unspecified lack of coordination     Problem List Patient Active Problem List   Diagnosis Date Noted  . Neurological abnormality 03/28/2018  . Hemiplegia and hemiparesis following cerebral infarction affecting left non-dominant side ([place]) 02/26/2018  . Globus pharyngeus 09/26/2017  . Left leg swelling 09/26/2017  . Blurred vision  08/17/2017  . History of CVA (cerebrovascular accident) 08/17/2017  . Urinary incontinence   . Severe hypertension 07/21/2017  . PRES (posterior reversible encephalopathy syndrome) 07/11/2017  . CKD (chronic kidney disease), stage III 01/22/2017  . OSA (obstructive sleep apnea) 06/19/2015  . Morbid obesity due to excess calories ([place])   . Hyperlipidemia   . History of cerebral hemorrhage 04/16/2015    Zakia Sainato, PT 12/25/18 1:06 PM  PHYSICAL THERAPY DISCHARGE SUMMARY  Visits from Start of Care: 2  Current functional level related to goals / functional outcomes: See above   Remaining deficits: See above   Education / Equipment: HEP, behavioral changes for pelvic health and urinary control Plan: Patient agrees to discharge.  Patient goals were partially met. Patient is being discharged due to not returning since the last visit.  ?????         Baruch Merl, PT 01/22/19 12:34 PM    [facility] [address], [place], [place], [address] Phone: [phone]   Fax:  [phone]  Name: Taylie Helder MRN: [id number] Date of Birth: [birth year]

## 2019-01-01 ENCOUNTER — Ambulatory Visit: Payer: Medicaid Other | Admitting: Physical Therapy

## 2019-01-01 ENCOUNTER — Telehealth: Payer: Self-pay | Admitting: Physical Therapy

## 2019-01-01 NOTE — Telephone Encounter (Signed)
Left patient a VM about no show today.  Explained about our cancellation/no show policy as this was second no show.  Reminded her of her next scheduled visit on 11/23. Christoher Drudge, PT 01/01/19 12:24 PM

## 2019-01-03 ENCOUNTER — Ambulatory Visit
Admission: RE | Admit: 2019-01-03 | Discharge: 2019-01-03 | Disposition: A | Discharge status: Home / Self Care | Payer: Medicaid Other | Source: Ambulatory Visit | Attending: Nurse Practitioner | Admitting: Nurse Practitioner

## 2019-01-03 ENCOUNTER — Telehealth: Payer: Self-pay | Admitting: Nurse Practitioner

## 2019-01-03 ENCOUNTER — Other Ambulatory Visit: Payer: Self-pay

## 2019-01-03 DIAGNOSIS — Z1231 Encounter for screening mammogram for malignant neoplasm of breast: Secondary | ICD-10-CM

## 2019-01-03 NOTE — Telephone Encounter (Signed)
Pt called to request and order be placed for Adult diapers size 3XL, states she spoke with provider 11/13/2018 about leaking issues and would like them sent to [facility] [address] [place], [place] [address] [phone] p Please follow up

## 2019-01-04 ENCOUNTER — Other Ambulatory Visit (HOSPITAL_COMMUNITY): Payer: Self-pay | Admitting: *Deleted

## 2019-01-04 ENCOUNTER — Other Ambulatory Visit: Payer: Self-pay | Admitting: Nurse Practitioner

## 2019-01-04 DIAGNOSIS — N3946 Mixed incontinence: Secondary | ICD-10-CM

## 2019-01-04 LAB — HM DIABETES EYE EXAM

## 2019-01-04 MED ORDER — MISC. DEVICES MISC: 0 refills | Status: AC

## 2019-01-04 NOTE — Telephone Encounter (Signed)
Bryson Dames the order is in her chart. Please fax. Thanks

## 2019-01-05 ENCOUNTER — Encounter (HOSPITAL_COMMUNITY)
Admission: RE | Admit: 2019-01-05 | Discharge: 2019-01-05 | Disposition: A | Discharge status: Home / Self Care | Payer: Medicaid Other | Source: Ambulatory Visit | Attending: Nephrology | Admitting: Nephrology

## 2019-01-05 ENCOUNTER — Other Ambulatory Visit: Payer: Self-pay

## 2019-01-05 DIAGNOSIS — D631 Anemia in chronic kidney disease: Secondary | ICD-10-CM | POA: Insufficient documentation

## 2019-01-05 DIAGNOSIS — N189 Chronic kidney disease, unspecified: Secondary | ICD-10-CM | POA: Diagnosis present

## 2019-01-05 MED ORDER — SODIUM CHLORIDE 0.9 % IV SOLN
510.0000 mg | INTRAVENOUS | Status: DC
  Administered 2019-01-05: 510 mg via INTRAVENOUS
  Filled 2019-01-05: qty 510

## 2019-01-05 NOTE — Discharge Instructions (Signed)

## 2019-01-08 ENCOUNTER — Ambulatory Visit: Payer: Medicaid Other | Admitting: Physical Therapy

## 2019-01-09 NOTE — Telephone Encounter (Signed)
CMA fax ordered to [facility] .

## 2019-01-15 ENCOUNTER — Emergency Department (HOSPITAL_COMMUNITY): Payer: Medicaid Other

## 2019-01-15 ENCOUNTER — Telehealth: Payer: Self-pay | Admitting: Physical Therapy

## 2019-01-15 ENCOUNTER — Ambulatory Visit: Payer: Medicaid Other | Admitting: Physical Therapy

## 2019-01-15 ENCOUNTER — Encounter (HOSPITAL_COMMUNITY): Payer: Medicaid Other

## 2019-01-15 ENCOUNTER — Encounter (HOSPITAL_COMMUNITY): Payer: Self-pay | Admitting: Emergency Medicine

## 2019-01-15 ENCOUNTER — Emergency Department (HOSPITAL_COMMUNITY)
Admission: EM | Admit: 2019-01-15 | Discharge: 2019-01-15 | Disposition: A | Discharge status: Home / Self Care | Payer: Medicaid Other | Attending: Emergency Medicine | Admitting: Emergency Medicine

## 2019-01-15 ENCOUNTER — Other Ambulatory Visit (HOSPITAL_COMMUNITY): Payer: Self-pay | Admitting: *Deleted

## 2019-01-15 ENCOUNTER — Other Ambulatory Visit: Payer: Self-pay

## 2019-01-15 DIAGNOSIS — N183 Chronic kidney disease, stage 3 unspecified: Secondary | ICD-10-CM | POA: Insufficient documentation

## 2019-01-15 DIAGNOSIS — I129 Hypertensive chronic kidney disease with stage 1 through stage 4 chronic kidney disease, or unspecified chronic kidney disease: Secondary | ICD-10-CM | POA: Diagnosis not present

## 2019-01-15 DIAGNOSIS — Z79899 Other long term (current) drug therapy: Secondary | ICD-10-CM | POA: Diagnosis not present

## 2019-01-15 DIAGNOSIS — R519 Headache, unspecified: Secondary | ICD-10-CM | POA: Diagnosis present

## 2019-01-15 DIAGNOSIS — I10 Essential (primary) hypertension: Secondary | ICD-10-CM

## 2019-01-15 DIAGNOSIS — Z91040 Latex allergy status: Secondary | ICD-10-CM | POA: Insufficient documentation

## 2019-01-15 DIAGNOSIS — Z7982 Long term (current) use of aspirin: Secondary | ICD-10-CM | POA: Diagnosis not present

## 2019-01-15 DIAGNOSIS — R42 Dizziness and giddiness: Secondary | ICD-10-CM | POA: Insufficient documentation

## 2019-01-15 DIAGNOSIS — R791 Abnormal coagulation profile: Secondary | ICD-10-CM | POA: Diagnosis not present

## 2019-01-15 LAB — I-STAT CHEM 8, ED
BUN: 14 mg/dL (ref 6–20)
Calcium, Ion: 1.09 mmol/L — ABNORMAL LOW (ref 1.15–1.40)
Chloride: 106 mmol/L (ref 98–111)
Creatinine, Ser: 1.4 mg/dL — ABNORMAL HIGH (ref 0.44–1.00)
Glucose, Bld: 90 mg/dL (ref 70–99)
HCT: 37 % (ref 36.0–46.0)
Hemoglobin: 12.6 g/dL (ref 12.0–15.0)
Potassium: 3.9 mmol/L (ref 3.5–5.1)
Sodium: 140 mmol/L (ref 135–145)
TCO2: 27 mmol/L (ref 22–32)

## 2019-01-15 LAB — CBC
HCT: 39.7 % (ref 36.0–46.0)
Hemoglobin: 12.3 g/dL (ref 12.0–15.0)
MCH: 22.2 pg — ABNORMAL LOW (ref 26.0–34.0)
MCHC: 31 g/dL (ref 30.0–36.0)
MCV: 71.8 fL — ABNORMAL LOW (ref 80.0–100.0)
Platelets: 317 10*3/uL (ref 150–400)
RBC: 5.53 MIL/uL — ABNORMAL HIGH (ref 3.87–5.11)
RDW: 22.5 % — ABNORMAL HIGH (ref 11.5–15.5)
WBC: 7.2 10*3/uL (ref 4.0–10.5)
nRBC: 0 % (ref 0.0–0.2)

## 2019-01-15 LAB — DIFFERENTIAL
Abs Immature Granulocytes: 0 10*3/uL (ref 0.00–0.07)
Basophils Absolute: 0 10*3/uL (ref 0.0–0.1)
Basophils Relative: 0 %
Eosinophils Absolute: 0 10*3/uL (ref 0.0–0.5)
Eosinophils Relative: 0 %
Lymphocytes Relative: 24 %
Lymphs Abs: 1.7 10*3/uL (ref 0.7–4.0)
Monocytes Absolute: 0.3 10*3/uL (ref 0.1–1.0)
Monocytes Relative: 4 %
Neutro Abs: 5.2 10*3/uL (ref 1.7–7.7)
Neutrophils Relative %: 72 %
nRBC: 0 /100 WBC

## 2019-01-15 LAB — COMPREHENSIVE METABOLIC PANEL
ALT: 13 U/L (ref 0–44)
AST: 22 U/L (ref 15–41)
Albumin: 3.6 g/dL (ref 3.5–5.0)
Alkaline Phosphatase: 75 U/L (ref 38–126)
Anion gap: 13 (ref 5–15)
BUN: 11 mg/dL (ref 6–20)
CO2: 22 mmol/L (ref 22–32)
Calcium: 9 mg/dL (ref 8.9–10.3)
Chloride: 105 mmol/L (ref 98–111)
Creatinine, Ser: 1.43 mg/dL — ABNORMAL HIGH (ref 0.44–1.00)
GFR calc Af Amer: 51 mL/min — ABNORMAL LOW (ref >60)
GFR calc non Af Amer: 44 mL/min — ABNORMAL LOW (ref >60)
Glucose, Bld: 95 mg/dL (ref 70–99)
Potassium: 4.2 mmol/L (ref 3.5–5.1)
Sodium: 140 mmol/L (ref 135–145)
Total Bilirubin: 0.5 mg/dL (ref 0.3–1.2)
Total Protein: 7.9 g/dL (ref 6.5–8.1)

## 2019-01-15 LAB — I-STAT BETA HCG BLOOD, ED (MC, WL, AP ONLY): I-stat hCG, quantitative: <5 m[IU]/mL (ref <5)

## 2019-01-15 LAB — APTT: aPTT: 33 seconds (ref 24–36)

## 2019-01-15 LAB — PROTIME-INR
INR: 1.1 (ref 0.8–1.2)
Prothrombin Time: 14.1 seconds (ref 11.4–15.2)

## 2019-01-15 MED ORDER — PROCHLORPERAZINE EDISYLATE 10 MG/2ML IJ SOLN
10.0000 mg | Freq: Once | INTRAMUSCULAR | Status: AC
  Administered 2019-01-15: 10 mg via INTRAVENOUS
  Filled 2019-01-15: qty 2

## 2019-01-15 MED ORDER — SODIUM CHLORIDE 0.9% FLUSH: 3.0000 mL | Freq: Once | INTRAVENOUS | Status: DC

## 2019-01-15 MED ORDER — LABETALOL HCL 5 MG/ML IV SOLN: 20.0000 mg | Freq: Once | INTRAVENOUS | Status: DC

## 2019-01-15 MED ORDER — DIPHENHYDRAMINE HCL 50 MG/ML IJ SOLN
25.0000 mg | Freq: Once | INTRAMUSCULAR | Status: AC
  Administered 2019-01-15: 25 mg via INTRAVENOUS
  Filled 2019-01-15: qty 1

## 2019-01-15 MED ORDER — MECLIZINE HCL 25 MG PO TABS: 25.0000 mg | ORAL_TABLET | Freq: Once | ORAL | Status: DC

## 2019-01-15 NOTE — ED Triage Notes (Addendum)
Pt states her BP has been running high for 2 days (99991111 systolic). Pt also complains of HA, tingling on right cheek, and cannot hear out of right ear. Pt has hx of stroke affecting left side. Pt has weak left extremities. No facial droop noted. Pt also reports dizziness.

## 2019-01-15 NOTE — Telephone Encounter (Signed)
LM for patient about this morning's missed PT appointment and asked her to call our office.   Bayan Kushnir, PT 01/15/19 3:07 PM

## 2019-01-15 NOTE — ED Notes (Signed)
Transported to MRI

## 2019-01-15 NOTE — ED Notes (Signed)
Patient verbalizes understanding of discharge instructions. Opportunity for questioning and answers were provided. Armband removed by staff, pt discharged from ED. Pt. ambulatory and discharged home.  

## 2019-01-15 NOTE — ED Provider Notes (Addendum)
[facility] EMERGENCY DEPARTMENT Provider Note   CSN: [id number] Arrival date & time: 01/15/19  1311     History   Chief Complaint Chief Complaint  Patient presents with   Dizziness   Headache    HPI Belinda Ramirez is a 43 y.o. female.     43 yo F with a chief complaints of headache.  Going on for about 3 days.  Also associated with hypertension loss of hearing to her right ear dizziness.  Has had some photophobia with this as well.  Denies history of similar headaches.  Denies head trauma denies fever denies neck pain.  She is chronically weak on her left side of her body from her prior stroke.  She denies cough congestion or fever.  Normally she walks with a cane but has been unable to due to unsteadiness and has to hold onto the wall as she walks.  The history is provided by the patient.  Dizziness Quality:  Imbalance Severity:  Moderate Onset quality:  Sudden Duration:  3 days Timing:  Constant Progression:  Worsening Chronicity:  New Context: eye movement, head movement and standing up   Relieved by:  Nothing Worsened by:  Nothing Ineffective treatments:  None tried Associated symptoms: headaches   Associated symptoms: no chest pain, no nausea, no palpitations, no shortness of breath and no vomiting   Headache Associated symptoms: dizziness and numbness (tingling to the right face)   Associated symptoms: no congestion, no fever, no myalgias, no nausea and no vomiting     Past Medical History:  Diagnosis Date   High cholesterol    History of cerebral hemorrhage 04/2015   2x2cm hemorrhage in right basal 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/2017   Left leg swelling 09/26/2017   Blurred vision 08/17/2017   History of CVA (cerebrovascular accident) 08/17/2017   Urinary incontinence    Severe hypertension 07/21/2017   PRES (posterior reversible encephalopathy syndrome) 07/11/2017   CKD (chronic kidney disease), stage III 01/22/2017   OSA (obstructive sleep apnea) 06/19/2015   Morbid obesity due to excess calories ([place])    Hyperlipidemia    History of cerebral hemorrhage 04/16/2015    Past Surgical History:  Procedure Laterality Date   TUBAL LIGATION     WISDOM TOOTH EXTRACTION       OB History   No obstetric history on file.      Home Medications    Prior to Admission medications   Medication Sig Start Date End Date Taking? Authorizing Provider  Accu-Chek FastClix Lancets MISC Use as instructed. Inject into the skin twice daily 05/31/18   Gildardo Pounds, NP  acetaminophen (TYLENOL) 500 MG tablet Take 2 tablets (1,000 mg total) by mouth every 6 (six) hours as needed. Patient not taking: Reported on 04/11/2018 07/19/17   McDonald, Mia A, PA-C  albuterol (PROVENTIL HFA;VENTOLIN HFA) 108 (90 Base) MCG/ACT inhaler Inhale 2 puffs into the lungs every 6 (six) hours as needed for wheezing or  shortness of breath. 03/24/18   Gildardo Pounds, NP  amLODipine (NORVASC) 10 MG tablet Take 1 tablet (10 mg total) by mouth daily. 11/13/18 02/11/19  Gildardo Pounds, NP  aspirin EC 81 MG EC tablet Take 1 tablet (81 mg total) by mouth daily. 04/01/18   Dorrell, Andree Elk, MD  atorvastatin (LIPITOR) 80 MG tablet Take 1 tablet (80 mg total) by mouth daily at 6 PM. 11/13/18   Gildardo Pounds, NP  Blood Glucose Calibration (ACCU-CHEK GUIDE CONTROL) LIQD 1 each by In Vitro route once as needed for up to 1 dose. 05/31/18   Gildardo Pounds, NP  Blood Pressure Monitor DEVI Please provide patient with insurance approved blood pressure monitor 11/13/18   Gildardo Pounds, NP  ferrous sulfate 325 (65 FE) MG EC tablet Take 1 tablet (325 mg total) by mouth 2 (two) times daily. 10/13/17 05/11/18  Jean Rosenthal, MD  fluticasone (FLONASE) 50 MCG/ACT nasal spray Place 2 sprays into both nostrils daily. 02/24/18   Gildardo Pounds, NP  glucose blood (ACCU-CHEK GUIDE) test strip Use as instructed. Check blood glucose by fingerstick twice per day. 05/31/18   Gildardo Pounds, NP  lisinopril (ZESTRIL) 40 MG tablet Take 1 tablet (40 mg total) by mouth daily. 11/13/18 02/11/19  Gildardo Pounds, NP  Misc. Devices MISC Please provide patient with insurance approved thigh high compression stockings for swelling. 11/13/18   Gildardo Pounds, NP  Misc. Devices MISC Please provide patient with insurance approved adult diapers size 3XL for urinary incontinence. ICD-10 N39.46 01/04/19   Gildardo Pounds, NP  PARoxetine (PAXIL) 10 MG tablet Take 1 tablet (10 mg total) by mouth daily. 11/13/18 02/11/19  Gildardo Pounds, NP  spironolactone (ALDACTONE) 25 MG tablet Take 1 tablet (25 mg total) by mouth daily. 11/13/18   Gildardo Pounds, NP    Family History Family History  Problem Relation Age of Onset   Heart attack Maternal Grandfather    Diabetes Mother    Hypertension Mother    Cancer Father    Heart disease Maternal Grandmother    Diabetes Sister    Diabetes Brother    Breast cancer Maternal Aunt     Social History Social History   Tobacco Use   Smoking status: Never Smoker   Smokeless tobacco: Never Used  Substance Use Topics   Alcohol use: Yes    Alcohol/week: 0.0 standard drinks    Comment: occaisonal   Drug use: No     Allergies   Oxycodone, Percocet [oxycodone-acetaminophen], Amoxicillin, Aspirin, Clonidine derivatives, and Latex   Review of Systems Review of Systems  Constitutional: Negative for chills and fever.    HENT: Negative for congestion and rhinorrhea.   Eyes: Negative for redness and visual disturbance.  Respiratory: Negative for shortness of breath and wheezing.   Cardiovascular: Negative for chest pain and palpitations.  Gastrointestinal: Negative for nausea and vomiting.  Genitourinary: Negative for dysuria and urgency.  Musculoskeletal: Negative for arthralgias and myalgias.  Skin: Negative for pallor and wound.  Neurological: Positive for dizziness, numbness (tingling to the right face) and headaches.  43 yo    Physical Exam Updated Vital Signs BP (!) 214/108    Pulse 80    Temp 98.9 F (37.2 C) (Oral)    Resp (!) 30    SpO2 100%   Physical Exam Vitals signs and nursing note reviewed.  Constitutional:      General: She is not in acute distress.  Appearance: She is well-developed. She is morbidly obese. She is not diaphoretic.  HENT:     Head: Normocephalic and atraumatic.  Eyes:     Pupils: Pupils are equal, round, and reactive to light.  Neck:     Musculoskeletal: Normal range of motion and neck supple.  Cardiovascular:     Rate and Rhythm: Normal rate and regular rhythm.     Heart sounds: No murmur. No friction rub. No gallop.   Pulmonary:     Effort: Pulmonary effort is normal.     Breath sounds: No wheezing or rales.  Abdominal:     General: There is no distension.     Palpations: Abdomen is soft.     Tenderness: There is no abdominal tenderness.  Musculoskeletal:        General: No tenderness.  Skin:    General: Skin is warm and dry.  Neurological:     Mental Status: She is alert and oriented to person, place, and time.     GCS: GCS eye subscore is 4. GCS verbal subscore is 5. GCS motor subscore is 6.     Cranial Nerves: Cranial nerves are intact.     Sensory: Sensation is intact.     Motor: Weakness (rue weakness) present.     Coordination: Coordination is intact.  Psychiatric:        Behavior: Behavior normal.      ED Treatments / Results   Labs (all labs ordered are listed, but only abnormal results are displayed) Labs Reviewed  CBC - Abnormal; Notable for the following components:      Result Value   RBC 5.53 (*)    MCV 71.8 (*)    MCH 22.2 (*)    RDW 22.5 (*)    All other components within normal limits  COMPREHENSIVE METABOLIC PANEL - Abnormal; Notable for the following components:   Creatinine, Ser 1.43 (*)    GFR calc non Af Amer 44 (*)    GFR calc Af Amer 51 (*)    All other components within normal limits  I-STAT CHEM 8, ED - Abnormal; Notable for the following components:   Creatinine, Ser 1.40 (*)    Calcium, Ion 1.09 (*)    All other components within normal limits  PROTIME-INR  APTT  DIFFERENTIAL  CBG MONITORING, ED  I-STAT BETA HCG BLOOD, ED ([facility], [facility], [facility] ONLY)    EKG EKG Interpretation  Date/Time:  Monday January 15 2019 13:38:40 EST Ventricular Rate:  73 PR Interval:  208 QRS Duration: 102 QT Interval:  416 QTC Calculation: 458 R Axis:   82 Text Interpretation: Normal sinus rhythm Cannot rule out Anterior infarct , age undetermined Abnormal ECG No significant change since last tracing Confirmed by Deno Etienne [phone]) on 01/15/2019 3:15:10 PM   Radiology Ct Head Wo Contrast  Result Date: 01/15/2019 CLINICAL DATA:  Hypertension. Headache. Right cheek tingling. Loss of hearing in the right ear. History of left-sided deficits from prior CVA. EXAM: CT HEAD WITHOUT CONTRAST TECHNIQUE: Contiguous axial images were obtained from the base of the skull through the vertex without intravenous contrast. COMPARISON:  03/28/2018 head CT. FINDINGS: Brain: Stable right frontal deep white matter encephalomalacia with mild ex vacuo dilatation of the frontal horn of the right lateral ventricle. No evidence of parenchymal hemorrhage or extra-axial fluid collection. No mass lesion, mass effect, or midline shift. No CT evidence of acute infarction. No acute ventriculomegaly. Vascular: No acute abnormality. Skull: No  evidence of calvarial fracture. Sinuses/Orbits: The  visualized paranasal sinuses are essentially clear. Other:  The mastoid air cells are unopacified. IMPRESSION: 1.  No evidence of acute intracranial abnormality. 2. Chronic encephalomalacia in the right frontal deep white matter. Electronically Signed   By: Ilona Sorrel M.D.   On: 01/15/2019 14:07   Mr Brain Wo Contrast  Result Date: 01/15/2019 CLINICAL DATA:  43 year old female with uncontrolled hypertension for 2 days. Headache, right cheek tingling, decreased right side hearing. Prior stroke. EXAM: MRI HEAD WITHOUT CONTRAST TECHNIQUE: Multiplanar, multiecho pulse sequences of the brain and surrounding structures were obtained without intravenous contrast. COMPARISON:  Brain MRI 03/28/2018. Head CT earlier today. FINDINGS: Brain: No restricted diffusion or evidence of acute infarction. Numerous chronic microhemorrhages widely scattered throughout the brain. Superimposed chronic hemorrhagic lacunar type infarcts of the right basal ganglia, left thalamus, pons. Additional bilateral deep white matter and deep gray nuclei chronic lacunar infarcts. Patchy additional white matter T2 and FLAIR hyperintensity is stable. There may be superimposed subtle areas of frontal lobe cortical encephalomalacia. No midline shift, mass effect, evidence of mass lesion, ventriculomegaly, extra-axial collection or acute intracranial hemorrhage. Cervicomedullary junction and pituitary are within normal limits. Vascular: Major intracranial vascular flow voids are stable. Skull and upper cervical spine: Chronic decreased T1 marrow signal is stable. Negative visible cervical spine. Sinuses/Orbits: Negative orbits. Paranasal sinus mucosal thickening has regressed. Other: Mastoids remain clear. Negative orbit and scalp soft tissues. IMPRESSION: 1. No acute intracranial abnormality. 2. Re-demonstration of severely advanced chronic small vessel disease, including numerous chronic  micro-hemorrhages widely scattered throughout the brain. Electronically Signed   By: Genevie Ann M.D.   On: 01/15/2019 19:43    Procedures Procedures (including critical care time)  Medications Ordered in ED Medications  sodium chloride flush (NS) 0.9 % injection 3 mL (has no administration in time range)  labetalol (NORMODYNE) injection 20 mg (has no administration in time range)  meclizine (ANTIVERT) tablet 25 mg (has no administration in time range)  prochlorperazine (COMPAZINE) injection 10 mg (10 mg Intravenous Given 01/15/19 1605)  diphenhydrAMINE (BENADRYL) injection 25 mg (25 mg Intravenous Given 01/15/19 1605)     Initial Impression / Assessment and Plan / ED Course  I have reviewed the triage vital signs and the nursing notes.  Pertinent labs & imaging results that were available during my care of the patient were reviewed by me and considered in my medical decision making (see chart for details).        43 yo F with a cc of right facial tingling, loss of hearing to the right ear, and difficulty with ambulation.    Initial workup from triage without obvious finding.  Will obtain MRI to eval for stroke, give headache cocktail to treat if atypical migraine, though no hx of migraines.   Patient states her headache is not improved at all on reassessment.  Her MRI is returned without acute finding.  She still has subjective loss of hearing and decreased sensation to the right side of her face.  Still feels dizzy.  Blood pressure has worsened.  I offered to discuss the case with the neurologist on-call.  The patient is declining states that she would rather go home at this time.  She will follow up with her neurologist and family doctor.  I discussed risk and benefits of this with her, offered for her to return anytime.  8:10 PM:  I have discussed the diagnosis/risks/treatment options with the patient and believe the pt to be eligible for discharge home to follow-up with PCP,  neuro. We  also discussed returning to the ED immediately if new or worsening sx occur. We discussed the sx which are most concerning (e.g., sudden worsening pain, fever, inability to tolerate by mouth, stroke s/sx) that necessitate immediate return. Medications administered to the patient during their visit and any new prescriptions provided to the patient are listed below.  Medications given during this visit Medications  sodium chloride flush (NS) 0.9 % injection 3 mL (has no administration in time range)  labetalol (NORMODYNE) injection 20 mg (has no administration in time range)  meclizine (ANTIVERT) tablet 25 mg (has no administration in time range)  prochlorperazine (COMPAZINE) injection 10 mg (10 mg Intravenous Given 01/15/19 1605)  diphenhydrAMINE (BENADRYL) injection 25 mg (25 mg Intravenous Given 01/15/19 1605)     The patient appears reasonably screen and/or stabilized for discharge and I doubt any other medical condition or other [facility] requiring further screening, evaluation, or treatment in the ED at this time prior to discharge.   Final Clinical Impressions(s) / ED Diagnoses   Final diagnoses:  Right-sided headache  Essential hypertension    ED Discharge Orders    None       Deno Etienne, DO 01/15/19 2009    Deno Etienne, DO 01/15/19 2010

## 2019-01-15 NOTE — Discharge Instructions (Signed)
Follow up with your doctor.  Return for worsening symptoms.  

## 2019-01-16 ENCOUNTER — Encounter (HOSPITAL_COMMUNITY): Payer: Medicaid Other

## 2019-01-22 ENCOUNTER — Ambulatory Visit: Payer: Medicaid Other | Admitting: Physical Therapy

## 2019-01-22 ENCOUNTER — Telehealth: Payer: Self-pay | Admitting: Physical Therapy

## 2019-01-22 NOTE — Telephone Encounter (Signed)
LM for Pt asking her to call our office back.  Pt missed PT appointment today at 11:45.  Valery Amedee, PT 01/22/19 12:16 PM

## 2019-01-23 ENCOUNTER — Ambulatory Visit (HOSPITAL_COMMUNITY)
Admission: RE | Admit: 2019-01-23 | Discharge: 2019-01-23 | Disposition: A | Discharge status: Home / Self Care | Payer: Medicaid Other | Source: Ambulatory Visit | Attending: Nephrology | Admitting: Nephrology

## 2019-01-23 ENCOUNTER — Other Ambulatory Visit: Payer: Medicaid Other | Admitting: Nurse Practitioner

## 2019-01-23 ENCOUNTER — Other Ambulatory Visit: Payer: Self-pay

## 2019-01-23 DIAGNOSIS — D631 Anemia in chronic kidney disease: Secondary | ICD-10-CM | POA: Insufficient documentation

## 2019-01-23 DIAGNOSIS — N189 Chronic kidney disease, unspecified: Secondary | ICD-10-CM | POA: Insufficient documentation

## 2019-01-23 MED ORDER — SODIUM CHLORIDE 0.9 % IV SOLN
510.0000 mg | INTRAVENOUS | Status: AC
  Administered 2019-01-23: 510 mg via INTRAVENOUS
  Filled 2019-01-23: qty 17

## 2019-01-23 NOTE — Progress Notes (Signed)
Patient here today for second dose of IV feraheme.  BP 199/106 HR 64.  Pt stated she was seen in the ER on 11/30 with high BP and dizziness and headache.  Pt denies any symptoms today.  In the ER they did an MRI that was negative and offered to call a neuro consult for her that she declined and stated she would follow up with her neuro DR.  I asked her if she had done that and she said no.  I told her she really needed to get that set up because her BP was very high and potentially could cause a stroke.  The patient said she knew that and had already had 5 strokes.  I again encouraged her to get that appt set up asap so she does not risk another.  After IV feraheme given BP was 213/111 HR 58.  Pt stated she took her norvasc and lisinopril at 1:00 this morning. I called [facility] and reported all of the above to [employer], CMA who stated the patient's BP was usually that high at their office as well but she would tell Dr Carolin Sicks and no orders were received.  I will continue to encourage the patient to make the neuro appt today and to go to ER if she begins to have any symptoms like she did when she went to the ER the last time.

## 2019-01-23 NOTE — Progress Notes (Signed)
Pts BP was 223/105 at DC time.  Pt denies any headache or dizziness.  I told her I called Dr Louie Boston office but received no orders.  I told her her blood pressure was even higher at this point and I stated that she needed to go to the ER but if she was not going to do that she had to do something, so she should call her neurologist and tell them how high her BP is today, and has been, and ask them to be seen and or what to take for it.  Pt stated she did not want to go to the ER so she would call her Dr.

## 2019-01-26 MED FILL — traZODone HCL 50 MG TABS: 50 | 30 days supply | Qty: 30 | Fill #0

## 2019-01-26 MED FILL — ESCITALOPRAM 20 MG TABLET: 20 | 30 days supply | Qty: 30 | Fill #0

## 2019-01-29 ENCOUNTER — Encounter: Payer: Medicaid Other | Admitting: Physical Therapy

## 2019-02-05 ENCOUNTER — Encounter: Payer: Medicaid Other | Admitting: Physical Therapy

## 2019-02-12 ENCOUNTER — Encounter: Payer: Medicaid Other | Admitting: Physical Therapy

## 2019-03-05 ENCOUNTER — Ambulatory Visit: Payer: Medicaid Other | Admitting: Nurse Practitioner

## 2019-03-15 MED FILL — traZODone HCL 50 MG TABS: 50 | 30 days supply | Qty: 60 | Fill #0

## 2019-03-20 MED FILL — hydrALAZINE HCL 100 MG TABS: 100 | 30 days supply | Qty: 90 | Fill #0

## 2019-03-23 MED FILL — CATAPRES-TTS 2 PATCH: 0.2 | 28 days supply | Qty: 4 | Fill #0

## 2019-03-23 MED FILL — ESCITALOPRAM 20 MG TABLET: 20 | 30 days supply | Qty: 30 | Fill #0

## 2019-03-27 MED FILL — VIT D2 1.25 MG (50,000 UNIT: 1.25 MG | 84 days supply | Qty: 12 | Fill #0

## 2019-03-29 ENCOUNTER — Ambulatory Visit: Payer: Medicaid Other | Admitting: Nurse Practitioner

## 2019-05-15 ENCOUNTER — Other Ambulatory Visit (HOSPITAL_COMMUNITY)
Admission: RE | Admit: 2019-05-15 | Discharge: 2019-05-15 | Disposition: A | Discharge status: Home / Self Care | Payer: Medicaid Other | Source: Ambulatory Visit | Attending: Nurse Practitioner | Admitting: Nurse Practitioner

## 2019-05-15 ENCOUNTER — Other Ambulatory Visit: Payer: Self-pay

## 2019-05-15 ENCOUNTER — Other Ambulatory Visit: Payer: Self-pay | Admitting: Nurse Practitioner

## 2019-05-15 ENCOUNTER — Encounter: Payer: Self-pay | Admitting: Nurse Practitioner

## 2019-05-15 ENCOUNTER — Ambulatory Visit: Payer: Medicaid Other | Attending: Nurse Practitioner | Admitting: Nurse Practitioner

## 2019-05-15 VITALS — BP 180/108 | HR 94 | Temp 97.7°F | Wt 292.0 lb

## 2019-05-15 DIAGNOSIS — I10 Essential (primary) hypertension: Secondary | ICD-10-CM | POA: Insufficient documentation

## 2019-05-15 DIAGNOSIS — Z124 Encounter for screening for malignant neoplasm of cervix: Secondary | ICD-10-CM | POA: Insufficient documentation

## 2019-05-15 DIAGNOSIS — D573 Sickle-cell trait: Secondary | ICD-10-CM | POA: Insufficient documentation

## 2019-05-15 DIAGNOSIS — E669 Obesity, unspecified: Secondary | ICD-10-CM | POA: Insufficient documentation

## 2019-05-15 DIAGNOSIS — E7800 Pure hypercholesterolemia, unspecified: Secondary | ICD-10-CM | POA: Diagnosis not present

## 2019-05-15 DIAGNOSIS — Z79899 Other long term (current) drug therapy: Secondary | ICD-10-CM | POA: Diagnosis not present

## 2019-05-15 DIAGNOSIS — R7303 Prediabetes: Secondary | ICD-10-CM | POA: Diagnosis not present

## 2019-05-15 DIAGNOSIS — Z8249 Family history of ischemic heart disease and other diseases of the circulatory system: Secondary | ICD-10-CM | POA: Insufficient documentation

## 2019-05-15 DIAGNOSIS — Z7901 Long term (current) use of anticoagulants: Secondary | ICD-10-CM | POA: Insufficient documentation

## 2019-05-15 DIAGNOSIS — Z6841 Body Mass Index (BMI) 40.0 and over, adult: Secondary | ICD-10-CM | POA: Diagnosis not present

## 2019-05-15 DIAGNOSIS — R42 Dizziness and giddiness: Secondary | ICD-10-CM | POA: Insufficient documentation

## 2019-05-15 DIAGNOSIS — Z7982 Long term (current) use of aspirin: Secondary | ICD-10-CM | POA: Diagnosis not present

## 2019-05-15 LAB — POCT GLYCOSYLATED HEMOGLOBIN (HGB A1C): Hemoglobin A1C: 6 % — AB (ref 4.0–5.6)

## 2019-05-15 LAB — GLUCOSE, POCT (MANUAL RESULT ENTRY): POC Glucose: 109 mg/dl — AB (ref 70–99)

## 2019-05-15 MED ORDER — CLONIDINE HCL 0.1 MG PO TABS
0.2000 mg | ORAL_TABLET | Freq: Once | ORAL | Status: AC
  Administered 2019-05-15: 0.2 mg via ORAL

## 2019-05-15 MED FILL — VIT D2 1.25 MG (50,000 UNIT: 1.25 MG | 84 days supply | Qty: 12 | Fill #0

## 2019-05-15 MED FILL — traZODone HCL 50 MG TABS: 50 | 30 days supply | Qty: 60 | Fill #0

## 2019-05-15 NOTE — Progress Notes (Signed)
Assessment & Plan:  Belinda Ramirez was seen today for gynecologic exam.  Diagnoses and all orders for this visit:  Encounter for Papanicolaou smear for cervical cancer screening -     Cytology - PAP -     Cervicovaginal ancillary only  Essential hypertension -     cloNIDine (CATAPRES) tablet 0.2 mg  Prediabetes -     Cancel: Hemoglobin A1c -     Glucose (CBG) -     HgB A1c    Patient has been counseled on age-appropriate routine health concerns for screening and prevention. These are reviewed and up-to-date. Referrals have been placed accordingly. Immunizations are up-to-date or declined.    Subjective:   Chief Complaint  Patient presents with  . Gynecologic Exam    Pt. is here for a pap smear.    HPI Belinda Ramirez 44 y.o. female presents to office today for PAP.    Blood pressure is not well controlled. She endorses medication adherence however she does not have Spironolactone in her medication bag. Many of her medications have labels with the date of 03-2018 on them. She states she puts her new pills in the old bottles?  I have explained to her that it is difficult to ascertain if she is taking all of her medications if the bottles are labeled from almost a year ago. She also required clonidine here in the office for her blood pressure. She endorses intermittent dizziness which I have explained to her is likely related to possible adherence concerns. Denies chest pain, worsening shortness of breath, palpitations,headaches or BLE edema. She is also prescribed amlodipine 10 mg daily and lisinopril 40 mg daily.  BP Readings from Last 3 Encounters:  05/15/19 (!) 180/108  01/23/19 (!) 223/105  01/15/19 (!) 214/108    Was switched from paxil to lexapro by her therapist a few weeks ago. Does not feel it is helpful however she does have a follow up in another week or so for re evaluation by her counselor. Denies any current thoughts of self harm.    Review of Systems    Constitutional: Negative.  Negative for chills, fever, malaise/fatigue and weight loss.  Respiratory: Negative.  Negative for cough, shortness of breath and wheezing.   Cardiovascular: Negative.  Negative for chest pain, orthopnea and leg swelling.  Gastrointestinal: Negative for abdominal pain.  Genitourinary: Negative.  Negative for flank pain.       Urinary incontinence  Skin: Negative.  Negative for rash.  Neurological: Positive for weakness (uses rolling walker).  Psychiatric/Behavioral: Positive for depression. Negative for suicidal ideas. The patient is nervous/anxious.     Past Medical History:  Diagnosis Date  . High cholesterol   . 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 Hypertension   . Obesity   . Sickle cell trait ([place])   . Stroke ([place])   . Urinary incontinence    For years. Resolved 5/19 spontaneously    Past Surgical History:  Procedure Laterality Date  . TUBAL LIGATION    . WISDOM TOOTH EXTRACTION      Family History  Problem Relation Age of Onset  . Heart attack Maternal Grandfather   . Diabetes Mother   . Hypertension Mother   . Cancer Father   . Heart disease Maternal Grandmother   . Diabetes Sister   .  Diabetes Brother   . Breast cancer Maternal Aunt     Social History Reviewed with no changes to be made today.   Outpatient Medications Prior to Visit  Medication Sig Dispense Refill  . Accu-Chek FastClix Lancets MISC Use as instructed. Inject into the skin twice daily 100 each 3  . albuterol (PROVENTIL HFA;VENTOLIN HFA) 108 (90 Base) MCG/ACT inhaler Inhale 2 puffs into the lungs every 6 (six) hours as needed for wheezing or shortness of breath. 1 Inhaler 2  . aspirin EC 81 MG EC tablet Take 1 tablet (81 mg total) by mouth daily. 30 tablet 1  .  atorvastatin (LIPITOR) 80 MG tablet Take 1 tablet (80 mg total) by mouth daily at 6 PM. 30 tablet 2  . Blood Glucose Calibration (ACCU-CHEK GUIDE CONTROL) LIQD 1 each by In Vitro route once as needed for up to 1 dose. 1 each 0  . Blood Pressure Monitor DEVI Please provide patient with insurance approved blood pressure monitor 1 Device 0  . escitalopram (LEXAPRO) 10 MG tablet Take 10 mg by mouth daily.    . fluticasone (FLONASE) 50 MCG/ACT nasal spray Place 2 sprays into both nostrils daily. 16 g 6  . glucose blood (ACCU-CHEK GUIDE) test strip Use as instructed. Check blood glucose by fingerstick twice per day. 100 each 12  . Misc. Devices MISC Please provide patient with insurance approved thigh high compression stockings for swelling. 1 each 0  . Misc. Devices MISC Please provide patient with insurance approved adult diapers size 3XL for urinary incontinence. ICD-10 N39.46 1 each 0  . spironolactone (ALDACTONE) 25 MG tablet Take 1 tablet (25 mg total) by mouth daily. 90 tablet 3  . traZODone (DESYREL) 50 MG tablet Take 50 mg by mouth at bedtime. Take 1/2 tablet at bedtime.    Marland Kitchen acetaminophen (TYLENOL) 500 MG tablet Take 2 tablets (1,000 mg total) by mouth every 6 (six) hours as needed. (Patient not taking: Reported on 04/11/2018) 30 tablet 0  . amLODipine (NORVASC) 10 MG tablet Take 1 tablet (10 mg total) by mouth daily. 90 tablet 2  . ferrous sulfate 325 (65 FE) MG EC tablet Take 1 tablet (325 mg total) by mouth 2 (two) times daily. 60 tablet 3  . lisinopril (ZESTRIL) 40 MG tablet Take 1 tablet (40 mg total) by mouth daily. 90 tablet 3  . PARoxetine (PAXIL) 10 MG tablet Take 1 tablet (10 mg total) by mouth daily. 90 tablet 0   No facility-administered medications prior to visit.    Allergies  Allergen Reactions  . Oxycodone Hives  . Percocet [Oxycodone-Acetaminophen] Hives  . Amoxicillin Hives    Has patient had a PCN reaction causing immediate rash, facial/tongue/throat swelling, SOB or  lightheadedness with hypotension: No Has patient had a PCN reaction causing severe rash involving mucus membranes or skin necrosis: No Has patient had a PCN reaction that required hospitalization: No Has patient had a PCN reaction occurring within the last 10 years: Yes If all of the above answers are "NO", then may proceed with Cephalosporin use.   . Aspirin Other (See Comments)    Hx of hemorrhagic stroke (pt does take low dose aspirin )  . Clonidine Derivatives   . Latex Itching       Objective:    BP (!) 180/108 (BP Location: Left Arm, Patient Position: Sitting, Cuff Size: Large)   Pulse 94   Temp 97.7 F (36.5 C) (Temporal)   Wt 292 lb (132.5 kg)   SpO2 97%  BMI 48.59 kg/m  Wt Readings from Last 3 Encounters:  05/15/19 292 lb (132.5 kg)  01/05/19 290 lb 8 oz (131.8 kg)  11/28/18 290 lb (131.5 kg)    Physical Exam Exam conducted with a chaperone present.  Constitutional:      Appearance: She is well-developed.  HENT:     Head: Normocephalic.  Cardiovascular:     Rate and Rhythm: Normal rate and regular rhythm.     Heart sounds: Normal heart sounds.  Pulmonary:     Effort: Pulmonary effort is normal.     Breath sounds: Normal breath sounds.  Abdominal:     General: Bowel sounds are normal.     Palpations: Abdomen is soft.     Hernia: There is no hernia in the left inguinal area.  Genitourinary:    Exam position: Lithotomy position.     Labia:        Right: No rash, tenderness, lesion or injury.        Left: No rash, tenderness, lesion or injury.      Vagina: Normal. No signs of injury and foreign body. No vaginal discharge, erythema, tenderness or bleeding.     Cervix: No cervical motion tenderness or friability.     Uterus: Not deviated and not enlarged.      Adnexa:        Right: No mass, tenderness or fullness.         Left: No mass, tenderness or fullness.       Rectum: Normal. No external hemorrhoid.  Lymphadenopathy:     Lower Body: No right  inguinal adenopathy. No left inguinal adenopathy.  Skin:    General: Skin is warm and dry.  Neurological:     Mental Status: She is alert and oriented to person, place, and time.  Psychiatric:        Behavior: Behavior normal.        Thought Content: Thought content normal.        Judgment: Judgment normal.          Patient has been counseled extensively about nutrition and exercise as well as the importance of adherence with medications and regular follow-up. The patient was given clear instructions to go to ER or return to medical center if symptoms don't improve, worsen or new problems develop. The patient verbalized understanding.   Follow-up: Return in about 3 weeks (around 06/05/2019) for BP recheck with LUKE.   Gildardo Pounds, FNP-BC [facility] [place], [place]   05/16/2019, 3:56 PM

## 2019-05-16 ENCOUNTER — Encounter: Payer: Self-pay | Admitting: Nurse Practitioner

## 2019-05-17 LAB — CERVICOVAGINAL ANCILLARY ONLY
Bacterial Vaginitis (gardnerella): POSITIVE — AB
Candida Glabrata: NEGATIVE
Candida Vaginitis: NEGATIVE
Chlamydia: NEGATIVE
Comment: NEGATIVE
Comment: NEGATIVE
Comment: NEGATIVE
Comment: NEGATIVE
Comment: NEGATIVE
Comment: NORMAL
Neisseria Gonorrhea: NEGATIVE
Trichomonas: POSITIVE — AB

## 2019-05-17 MED FILL — VENTOLIN HFA 90 MCG INHALER: 108 (90 BAS | 25 days supply | Qty: 18 | Fill #0

## 2019-05-18 LAB — CYTOLOGY - PAP
Comment: NEGATIVE
Diagnosis: NEGATIVE
High risk HPV: NEGATIVE

## 2019-05-21 MED FILL — ESCITALOPRAM 20 MG TABLET: 20 | 30 days supply | Qty: 30 | Fill #0

## 2019-05-21 MED FILL — hydrOXYzine HCL 25 MG TABS: 25 | 15 days supply | Qty: 60 | Fill #0

## 2019-05-28 ENCOUNTER — Other Ambulatory Visit: Payer: Self-pay | Admitting: Nurse Practitioner

## 2019-05-28 MED ORDER — METRONIDAZOLE 500 MG PO TABS: 500.0000 mg | ORAL_TABLET | Freq: Two times a day (BID) | ORAL | 0 refills | Status: AC

## 2019-05-29 MED FILL — metroNIDAZOLE 500 MG TABS: 500 | 7 days supply | Qty: 14 | Fill #0

## 2019-06-01 ENCOUNTER — Telehealth: Payer: Self-pay | Admitting: Nurse Practitioner

## 2019-06-01 NOTE — Telephone Encounter (Signed)
[employer] fax number for the sleep study is [phone]

## 2019-06-04 MED FILL — cloNIDine HCL 0.2 MG TABS: 0.2 | 90 days supply | Qty: 270 | Fill #0

## 2019-06-05 ENCOUNTER — Encounter: Payer: Self-pay | Admitting: Pharmacist

## 2019-06-05 ENCOUNTER — Other Ambulatory Visit: Payer: Self-pay

## 2019-06-05 ENCOUNTER — Ambulatory Visit: Payer: Medicaid Other | Attending: Nurse Practitioner | Admitting: Pharmacist

## 2019-06-05 VITALS — BP 159/100 | HR 76

## 2019-06-05 DIAGNOSIS — I10 Essential (primary) hypertension: Secondary | ICD-10-CM

## 2019-06-05 DIAGNOSIS — Z79899 Other long term (current) drug therapy: Secondary | ICD-10-CM | POA: Insufficient documentation

## 2019-06-05 DIAGNOSIS — Z8249 Family history of ischemic heart disease and other diseases of the circulatory system: Secondary | ICD-10-CM | POA: Diagnosis not present

## 2019-06-05 DIAGNOSIS — Z8673 Personal history of transient ischemic attack (TIA), and cerebral infarction without residual deficits: Secondary | ICD-10-CM | POA: Insufficient documentation

## 2019-06-05 NOTE — Patient Instructions (Signed)
Hydralazine 100 mg Take 1 tablet three times a day  Clonidine 0.2 mg  Take 1 tablet three times a day  Amlodipine 10 mg  Take 1 tablet in the morning   Lisinopril 40 mg  Take 1 tablet at night   Vitamin D  Take 2 capsules at dinner time

## 2019-06-05 NOTE — Progress Notes (Signed)
   S:   PCP: Zelda  Patient arrives in good spirits.  Presents to the clinic for BP management. Patient was referred by Zelda on 05/15/2019. BP elevated at that clinic; she required PO clonidine x1. She was noted to have issues with medication adherence. Today, she informs me that she was seen by her nephrologist who started clonidine yesterday. She also brings a bottle of hydralazine and reports that her Nephrologist has placed her on this as well.   Today, pt denies HA or blurred vision. Denies chest pain, dyspnea, or lower extremity swelling.   Patient reports adherence with medications.  Current BP Medications include:   - Amlodipine 10 mg daily - Clonidine 0.2 mg TID - Hydralazine 100 mg TID  - Lisinopril 40 mg daily  Antihypertensives tried in the past include:  - HCTZ?  - chlorthalidone  Dietary habits include: limits salt, drinks 2, 6-oz bottles Pepsi Exercise habits include: limited mobility (recent stroke) Family / Social history:  - FHx: HTN (mother) - Tobacco: never smoker - Alcohol: occasional alcohol use   Home BP readings: doesn't take at home  O:  Vitals:   06/05/19 1403  BP: (!) 159/100  Pulse: 76   Last 3 Office BP readings: BP Readings from Last 3 Encounters:  06/05/19 (!) 159/100  05/15/19 (!) 180/108  01/23/19 (!) 223/105   BMET    Component Value Date/Time   NA 140 01/15/2019 1456   NA 140 02/24/2018 1612   K 3.9 01/15/2019 1456   CL 106 01/15/2019 1456   CO2 22 01/15/2019 1400   GLUCOSE 90 01/15/2019 1456   BUN 14 01/15/2019 1456   BUN 24 02/24/2018 1612   CREATININE 1.40 (H) 01/15/2019 1456   CALCIUM 9.0 01/15/2019 1400   GFRNONAA 44 (L) 01/15/2019 1400   GFRAA 51 (L) 01/15/2019 1400   Renal function: CrCl cannot be calculated (Patient's most recent lab result is older than the maximum 21 days allowed.).  Clinical ASCVD: Yes   The ASCVD Risk score Mikey Bussing DC Jr., et al., 2013) failed to calculate for the following reasons:   The  patient has a prior MI or stroke diagnosis  A/P: Hypertension longstanding currently uncontrolled on current medications. BP Goal <130/80 mmHg. Patient is adherent with current medications. She has just picked-up her clonidine today and has not started this. I have encouraged her to do so and return to see me in 1 week.   -Continued current medications.  -Counseled on lifestyle modifications for blood pressure control including reduced dietary sodium, increased exercise, adequate sleep  Results reviewed and written information provided. Total time in face-to-face counseling 15 minutes.   F/U BP check in 1 week.   Benard Halsted, PharmD, [place] [phone]

## 2019-06-12 ENCOUNTER — Ambulatory Visit: Payer: Medicaid Other | Admitting: Pharmacist

## 2019-06-12 ENCOUNTER — Other Ambulatory Visit: Payer: Self-pay | Admitting: Nephrology

## 2019-06-12 DIAGNOSIS — N183 Chronic kidney disease, stage 3 unspecified: Secondary | ICD-10-CM

## 2019-06-12 DIAGNOSIS — I129 Hypertensive chronic kidney disease with stage 1 through stage 4 chronic kidney disease, or unspecified chronic kidney disease: Secondary | ICD-10-CM

## 2019-06-21 MED FILL — cloNIDine HCL 0.3 MG TABS: 0.3 | 30 days supply | Qty: 90 | Fill #0

## 2019-06-27 ENCOUNTER — Encounter (HOSPITAL_COMMUNITY): Payer: Self-pay

## 2019-06-27 ENCOUNTER — Emergency Department (HOSPITAL_COMMUNITY): Payer: Medicaid Other

## 2019-06-27 ENCOUNTER — Other Ambulatory Visit: Payer: Self-pay

## 2019-06-27 ENCOUNTER — Inpatient Hospital Stay (HOSPITAL_COMMUNITY)
Admission: EM | Admit: 2019-06-27 | Discharge: 2019-06-30 | DRG: 305 | Disposition: A | Discharge status: Home / Self Care | Payer: Medicaid Other | Attending: Internal Medicine | Admitting: Internal Medicine

## 2019-06-27 DIAGNOSIS — E782 Mixed hyperlipidemia: Secondary | ICD-10-CM

## 2019-06-27 DIAGNOSIS — Z8673 Personal history of transient ischemic attack (TIA), and cerebral infarction without residual deficits: Secondary | ICD-10-CM

## 2019-06-27 DIAGNOSIS — Z888 Allergy status to other drugs, medicaments and biological substances status: Secondary | ICD-10-CM

## 2019-06-27 DIAGNOSIS — E1165 Type 2 diabetes mellitus with hyperglycemia: Secondary | ICD-10-CM | POA: Diagnosis present

## 2019-06-27 DIAGNOSIS — G8194 Hemiplegia, unspecified affecting left nondominant side: Secondary | ICD-10-CM | POA: Diagnosis present

## 2019-06-27 DIAGNOSIS — F329 Major depressive disorder, single episode, unspecified: Secondary | ICD-10-CM | POA: Diagnosis present

## 2019-06-27 DIAGNOSIS — N1831 Chronic kidney disease, stage 3a: Secondary | ICD-10-CM | POA: Diagnosis present

## 2019-06-27 DIAGNOSIS — Z79899 Other long term (current) drug therapy: Secondary | ICD-10-CM

## 2019-06-27 DIAGNOSIS — R200 Anesthesia of skin: Secondary | ICD-10-CM | POA: Diagnosis present

## 2019-06-27 DIAGNOSIS — G459 Transient cerebral ischemic attack, unspecified: Secondary | ICD-10-CM | POA: Diagnosis present

## 2019-06-27 DIAGNOSIS — R269 Unspecified abnormalities of gait and mobility: Secondary | ICD-10-CM | POA: Diagnosis present

## 2019-06-27 DIAGNOSIS — Z20822 Contact with and (suspected) exposure to covid-19: Secondary | ICD-10-CM | POA: Diagnosis present

## 2019-06-27 DIAGNOSIS — E1122 Type 2 diabetes mellitus with diabetic chronic kidney disease: Secondary | ICD-10-CM | POA: Diagnosis present

## 2019-06-27 DIAGNOSIS — Z6841 Body Mass Index (BMI) 40.0 and over, adult: Secondary | ICD-10-CM

## 2019-06-27 DIAGNOSIS — E875 Hyperkalemia: Secondary | ICD-10-CM | POA: Diagnosis present

## 2019-06-27 DIAGNOSIS — I10 Essential (primary) hypertension: Secondary | ICD-10-CM | POA: Diagnosis not present

## 2019-06-27 DIAGNOSIS — I169 Hypertensive crisis, unspecified: Secondary | ICD-10-CM

## 2019-06-27 DIAGNOSIS — Z885 Allergy status to narcotic agent status: Secondary | ICD-10-CM

## 2019-06-27 DIAGNOSIS — G4733 Obstructive sleep apnea (adult) (pediatric): Secondary | ICD-10-CM | POA: Diagnosis present

## 2019-06-27 DIAGNOSIS — I1310 Hypertensive heart and chronic kidney disease without heart failure, with stage 1 through stage 4 chronic kidney disease, or unspecified chronic kidney disease: Secondary | ICD-10-CM | POA: Diagnosis present

## 2019-06-27 DIAGNOSIS — R29700 NIHSS score 0: Secondary | ICD-10-CM | POA: Diagnosis present

## 2019-06-27 DIAGNOSIS — D573 Sickle-cell trait: Secondary | ICD-10-CM | POA: Diagnosis present

## 2019-06-27 DIAGNOSIS — Z833 Family history of diabetes mellitus: Secondary | ICD-10-CM

## 2019-06-27 DIAGNOSIS — Z803 Family history of malignant neoplasm of breast: Secondary | ICD-10-CM | POA: Diagnosis not present

## 2019-06-27 DIAGNOSIS — N179 Acute kidney failure, unspecified: Secondary | ICD-10-CM | POA: Diagnosis not present

## 2019-06-27 DIAGNOSIS — F419 Anxiety disorder, unspecified: Secondary | ICD-10-CM | POA: Diagnosis present

## 2019-06-27 DIAGNOSIS — E785 Hyperlipidemia, unspecified: Secondary | ICD-10-CM | POA: Diagnosis present

## 2019-06-27 DIAGNOSIS — D509 Iron deficiency anemia, unspecified: Secondary | ICD-10-CM | POA: Diagnosis present

## 2019-06-27 DIAGNOSIS — Z8249 Family history of ischemic heart disease and other diseases of the circulatory system: Secondary | ICD-10-CM

## 2019-06-27 DIAGNOSIS — E7800 Pure hypercholesterolemia, unspecified: Secondary | ICD-10-CM | POA: Diagnosis present

## 2019-06-27 DIAGNOSIS — Z7982 Long term (current) use of aspirin: Secondary | ICD-10-CM

## 2019-06-27 DIAGNOSIS — I161 Hypertensive emergency: Principal | ICD-10-CM | POA: Diagnosis present

## 2019-06-27 DIAGNOSIS — R7303 Prediabetes: Secondary | ICD-10-CM

## 2019-06-27 DIAGNOSIS — Z880 Allergy status to penicillin: Secondary | ICD-10-CM

## 2019-06-27 DIAGNOSIS — Z91040 Latex allergy status: Secondary | ICD-10-CM

## 2019-06-27 LAB — CBG MONITORING, ED: Glucose-Capillary: 132 mg/dL — ABNORMAL HIGH (ref 70–99)

## 2019-06-27 LAB — COMPREHENSIVE METABOLIC PANEL
ALT: 20 U/L (ref 0–44)
AST: 43 U/L — ABNORMAL HIGH (ref 15–41)
Albumin: 3.3 g/dL — ABNORMAL LOW (ref 3.5–5.0)
Alkaline Phosphatase: 84 U/L (ref 38–126)
Anion gap: 8 (ref 5–15)
BUN: 12 mg/dL (ref 6–20)
CO2: 27 mmol/L (ref 22–32)
Calcium: 8.8 mg/dL — ABNORMAL LOW (ref 8.9–10.3)
Chloride: 103 mmol/L (ref 98–111)
Creatinine, Ser: 1.47 mg/dL — ABNORMAL HIGH (ref 0.44–1.00)
GFR calc Af Amer: 49 mL/min — ABNORMAL LOW (ref >60)
GFR calc non Af Amer: 43 mL/min — ABNORMAL LOW (ref >60)
Glucose, Bld: 148 mg/dL — ABNORMAL HIGH (ref 70–99)
Potassium: 6.1 mmol/L — ABNORMAL HIGH (ref 3.5–5.1)
Sodium: 138 mmol/L (ref 135–145)
Total Bilirubin: 0.6 mg/dL (ref 0.3–1.2)
Total Protein: 7.8 g/dL (ref 6.5–8.1)

## 2019-06-27 LAB — DIFFERENTIAL
Abs Immature Granulocytes: 0.02 10*3/uL (ref 0.00–0.07)
Basophils Absolute: 0 10*3/uL (ref 0.0–0.1)
Basophils Relative: 0 %
Eosinophils Absolute: 0.1 10*3/uL (ref 0.0–0.5)
Eosinophils Relative: 1 %
Immature Granulocytes: 0 %
Lymphocytes Relative: 27 %
Lymphs Abs: 2.5 10*3/uL (ref 0.7–4.0)
Monocytes Absolute: 0.5 10*3/uL (ref 0.1–1.0)
Monocytes Relative: 5 %
Neutro Abs: 6.3 10*3/uL (ref 1.7–7.7)
Neutrophils Relative %: 67 %

## 2019-06-27 LAB — I-STAT CHEM 8, ED
BUN: 12 mg/dL (ref 6–20)
Calcium, Ion: 1.11 mmol/L — ABNORMAL LOW (ref 1.15–1.40)
Chloride: 102 mmol/L (ref 98–111)
Creatinine, Ser: 1.5 mg/dL — ABNORMAL HIGH (ref 0.44–1.00)
Glucose, Bld: 140 mg/dL — ABNORMAL HIGH (ref 70–99)
HCT: 42 % (ref 36.0–46.0)
Hemoglobin: 14.3 g/dL (ref 12.0–15.0)
Potassium: 3.9 mmol/L (ref 3.5–5.1)
Sodium: 141 mmol/L (ref 135–145)
TCO2: 30 mmol/L (ref 22–32)

## 2019-06-27 LAB — I-STAT BETA HCG BLOOD, ED (MC, WL, AP ONLY): I-stat hCG, quantitative: <5 m[IU]/mL (ref <5)

## 2019-06-27 LAB — CBC
HCT: 41.9 % (ref 36.0–46.0)
Hemoglobin: 13.2 g/dL (ref 12.0–15.0)
MCH: 24.7 pg — ABNORMAL LOW (ref 26.0–34.0)
MCHC: 31.5 g/dL (ref 30.0–36.0)
MCV: 78.3 fL — ABNORMAL LOW (ref 80.0–100.0)
Platelets: 318 10*3/uL (ref 150–400)
RBC: 5.35 MIL/uL — ABNORMAL HIGH (ref 3.87–5.11)
RDW: 16.4 % — ABNORMAL HIGH (ref 11.5–15.5)
WBC: 9.4 10*3/uL (ref 4.0–10.5)
nRBC: 0 % (ref 0.0–0.2)

## 2019-06-27 LAB — APTT: aPTT: 34 seconds (ref 24–36)

## 2019-06-27 LAB — PROTIME-INR
INR: 1 (ref 0.8–1.2)
Prothrombin Time: 12.5 seconds (ref 11.4–15.2)

## 2019-06-27 LAB — SARS CORONAVIRUS 2 BY RT PCR (HOSPITAL ORDER, PERFORMED IN ~~LOC~~ HOSPITAL LAB): SARS Coronavirus 2: NEGATIVE

## 2019-06-27 LAB — GLUCOSE, CAPILLARY: Glucose-Capillary: 126 mg/dL — ABNORMAL HIGH (ref 70–99)

## 2019-06-27 MED ORDER — CLEVIDIPINE BUTYRATE 0.5 MG/ML IV EMUL
INTRAVENOUS | Status: AC
  Filled 2019-06-27: qty 50

## 2019-06-27 MED ORDER — HEPARIN SODIUM (PORCINE) 5000 UNIT/ML IJ SOLN
5000.0000 [IU] | Freq: Three times a day (TID) | INTRAMUSCULAR | Status: DC
  Administered 2019-06-28 – 2019-06-30 (×7): 5000 [IU] via SUBCUTANEOUS
  Filled 2019-06-27 (×7): qty 1

## 2019-06-27 MED ORDER — CLEVIDIPINE BUTYRATE 0.5 MG/ML IV EMUL
0.0000 mg/h | INTRAVENOUS | Status: DC
  Administered 2019-06-27: 15:00:00 2 mg/h via INTRAVENOUS

## 2019-06-27 MED ORDER — LABETALOL HCL 5 MG/ML IV SOLN
INTRAVENOUS | Status: AC
  Filled 2019-06-27: qty 4

## 2019-06-27 MED ORDER — IOHEXOL 350 MG/ML SOLN
75.0000 mL | Freq: Once | INTRAVENOUS | Status: AC | PRN
  Administered 2019-06-27: 75 mL via INTRAVENOUS

## 2019-06-27 MED ORDER — CLEVIDIPINE BUTYRATE 0.5 MG/ML IV EMUL: 0.0000 mg/h | INTRAVENOUS | Status: DC

## 2019-06-27 MED ORDER — SODIUM CHLORIDE 0.9% FLUSH
3.0000 mL | Freq: Once | INTRAVENOUS | Status: DC
Start: 2019-06-27 — End: 2019-06-30

## 2019-06-27 MED ORDER — LABETALOL HCL 5 MG/ML IV SOLN
10.0000 mg | Freq: Once | INTRAVENOUS | Status: AC
  Administered 2019-06-27: 16:00:00 10 mg via INTRAVENOUS

## 2019-06-27 MED ORDER — NICARDIPINE HCL IN NACL 20-0.86 MG/200ML-% IV SOLN
0.0000 mg/h | INTRAVENOUS | Status: DC
  Administered 2019-06-27: 16:00:00 5 mg/h via INTRAVENOUS
  Administered 2019-06-27: 23:00:00 2.2 mg/h via INTRAVENOUS
  Administered 2019-06-28: 4.7 mg/h via INTRAVENOUS
  Administered 2019-06-28: 4.5 mg/h via INTRAVENOUS
  Administered 2019-06-28: 7 mg/h via INTRAVENOUS
  Filled 2019-06-27 (×8): qty 200

## 2019-06-27 NOTE — Code Documentation (Addendum)
Called MD Leonel Ramsay regarding pt's BP remainging > 220 on highest dose of Cardene. Gave order to start 10 mg of Labetalol and change back to Cleviprex if no change. BP goal 180-200 until MRI completed.

## 2019-06-27 NOTE — H&P (Addendum)
NAME:  Belinda Ramirez, MRN:  [id number], DOB:  01-Nov-1975, LOS: 0 ADMISSION DATE:  06/27/2019, CONSULTATION DATE:  5/12 REFERRING MD:  [name] EDP, CHIEF COMPLAINT:  Hypertension   Brief History   44 year old female presented with left-sided weakness and paresthesia found to have hypertensive crisis.  Admitted to ICU on nicardipine infusion.  History of present illness   44 year old female with past medical history as below, which is significant for intracranial hemorrhage, multiple strokes, posterior reversible encephalopathy syndrome, obesity, and resistant hypertension.  [facility] where she has been on several different regimens for hypertension, none of which have truly taken.  Current regimen includes amlodipine 10 mg daily, clonidine 0.2 mg 3 times daily, hydralazine 100 mg 3 times daily, and lisinopril 40 mg daily.  She had some success with clonidine transdermally in the past, however, she ran to insurance issues and is no longer able to obtain this.  She states it is not unusual for her blood pressure systolically to be in the 200 range.  In the morning hours of 5/12 after eating breakfast she first noticed numbness and tingling of her tongue and the left side of her face.  This slowly progressed to include her left arm and left leg.  She also describes weakness in her left arm.  Blood pressure upon arrival to the emergency department was 241/104.  She was started immediately on clevidipine infusion for cautious lowering of blood pressure.  She was evaluated by neurology in the emergency department who recommended MRI which did not indicate acute abnormality or evidence of PRES. PCCM asked to admit.  Past Medical History   has a past medical history of High cholesterol, History of cerebral hemorrhage (04/2015), History of cerebral infarction (04/13/2016), Hypertension, Obesity, Sickle cell trait ([place]), Stroke ([place]), and Urinary  incontinence.   Significant Hospital Events     Consults:  Neurology  Procedures:    Significant Diagnostic Tests:  CT head 5/12 > Nonacute, atrophy and chronic ischemic changes.  CTA head/neck 5/12 > No acute large or medium vessel occlusion MRI brain 5/12 > No evidence of acute intracranial abnormality, including acute infarction. Redemonstrated advanced chronic small vessel ischemic disease with chronic lacunar infarcts in the cerebral white matter, right basal ganglia, thalami and pons. As before, there are numerous supratentorial and infratentorial chronic microhemorrhages.  Micro Data:  COVID 5/12 neg  Antimicrobials:     Interim history/subjective:    Objective   Blood pressure (!) 182/91, pulse (!) 105, temperature 98.9 F (37.2 C), temperature source Oral, resp. rate (!) 33, weight (!) 136.4 kg, SpO2 92 %.       No intake or output data in the 24 hours ending 06/27/19 2058 Filed Weights   06/27/19 1400  Weight: (!) 136.4 kg    Examination: General: morbidly obese female in NAD HENT: [place]/AT, PERRL, no JVD Lungs: Clear bilateral breath sounds unlabored Cardiovascular: RRR, no MRG Abdomen: Soft, nondistended Extremities: NO acute deformity. Moving all extremities with good strength Neuro: Awake, alert, oriented, nonfocal   Resolved Hospital Problem list     Assessment & Plan:   Hypertensive crisis: history of refractory hypertension. Currently undergoing outpatient workup. - nicardipine infusion to keep SBP 170-144mHg for tonight. - Restart home medications in the morning (Clonidine, lisinopril, amlodipine, hydralazine) - Consider inpatient workup for refractory hypertension. She has renal dopplers scheduled as an outpatient where she has recently been followed by nephrology.  - Echocardiogram - Telemetry  CKD Hyperkalemia -  Repeat BMP - Consider nephrology consult   ? DM history - Follow glucose on chemisty  Best practice:  Diet:  NPO Pain/Anxiety/Delirium protocol (if indicated): NA VAP protocol (if indicated): NA DVT prophylaxis: SQH GI prophylaxis: PPI Glucose control: NA Mobility:OOB with assist Code Status: FULL Family Communication: Patient and husband updated Disposition: ICU  Labs   CBC: Recent Labs  Lab 06/27/19 1434 06/27/19 1459  WBC 9.4  --   NEUTROABS 6.3  --   HGB 13.2 14.3  HCT 41.9 42.0  MCV 78.3*  --   PLT 318  --     Basic Metabolic Panel: Recent Labs  Lab 06/27/19 1434 06/27/19 1459  NA 138 141  K 6.1* 3.9  CL 103 102  CO2 27  --   GLUCOSE 148* 140*  BUN 12 12  CREATININE 1.47* 1.50*  CALCIUM 8.8*  --    GFR: Estimated Creatinine Clearance: 66.4 mL/min (A) (by C-G formula based on SCr of 1.5 mg/dL (H)). Recent Labs  Lab 06/27/19 1434  WBC 9.4    Liver Function Tests: Recent Labs  Lab 06/27/19 1434  AST 43*  ALT 20  ALKPHOS 84  BILITOT 0.6  PROT 7.8  ALBUMIN 3.3*   No results for input(s): LIPASE, AMYLASE in the last 168 hours. No results for input(s): AMMONIA in the last 168 hours.  ABG    Component Value Date/Time   TCO2 30 06/27/2019 1459     Coagulation Profile: Recent Labs  Lab 06/27/19 1434  INR 1.0    Cardiac Enzymes: No results for input(s): CKTOTAL, CKMB, CKMBINDEX, TROPONINI in the last 168 hours.  HbA1C: Hemoglobin A1C  Date/Time Value Ref Range Status  05/15/2019 02:09 PM 6.0 (A) 4.0 - 5.6 % Final  11/13/2018 11:26 AM 5.9 (A) 4.0 - 5.6 % Final   Hgb A1c MFr Bld  Date/Time Value Ref Range Status  03/29/2018 05:05 AM 6.2 (H) 4.8 - 5.6 % Final    Comment:    (NOTE) Pre diabetes:          5.7%-6.4% Diabetes:              >6.4% Glycemic control for   <7.0% adults with diabetes   07/12/2017 06:51 AM 5.4 4.8 - 5.6 % Final    Comment:    (NOTE) Pre diabetes:          5.7%-6.4% Diabetes:              >6.4% Glycemic control for   <7.0% adults with diabetes     CBG: Recent Labs  Lab 06/27/19 1433  GLUCAP 132*     Review of Systems:   As above  Past Medical History  She,  has a past medical history of High cholesterol, History of cerebral hemorrhage (04/2015), History of cerebral infarction (04/13/2016), Hypertension, Obesity, Sickle cell trait ([place]), Stroke ([place]), and Urinary incontinence.   Surgical History    Past Surgical History:  Procedure Laterality Date   TUBAL LIGATION     WISDOM TOOTH EXTRACTION       Social History   reports that she has never smoked. She has never used smokeless tobacco. She reports current alcohol use. She reports that she does not use drugs.   Family History   Her family history includes Breast cancer in her maternal aunt; Cancer in her father; Diabetes in her brother, mother, and sister; Heart attack in her maternal grandfather; Heart disease in her maternal grandmother; Hypertension in her mother.   Allergies  Allergies  Allergen Reactions   Oxycodone Hives   Percocet [Oxycodone-Acetaminophen] Hives   Amoxicillin Hives    Has patient had a PCN reaction causing immediate rash, facial/tongue/throat swelling, SOB or lightheadedness with hypotension: No Has patient had a PCN reaction causing severe rash involving mucus membranes or skin necrosis: No Has patient had a PCN reaction that required hospitalization: No Has patient had a PCN reaction occurring within the last 10 years: Yes If all of the above answers are "NO", then may proceed with Cephalosporin use.    Aspirin Other (See Comments)    Hx of hemorrhagic stroke (pt does take low dose aspirin )   Clonidine Derivatives Swelling and Other (See Comments)    Body swells, but breathing remains okay   Latex Itching   Tape Rash     Home Medications  Prior to Admission medications   Medication Sig Start Date End Date Taking? Authorizing Provider  acetaminophen (TYLENOL) 500 MG tablet Take 2 tablets (1,000 mg total) by mouth every 6 (six) hours as needed. Patient taking differently: Take 1,000 mg by  mouth every 6 (six) hours as needed for mild pain or headache.  07/19/17  Yes McDonald, Mia A, PA-C  aspirin EC 81 MG EC tablet Take 1 tablet (81 mg total) by mouth daily. 04/01/18  Yes Dorrell, Andree Elk, MD  cloNIDine (CATAPRES) 0.2 MG tablet Take 0.2 mg by mouth 2 (two) times daily.    Yes [provider]  escitalopram (LEXAPRO) 20 MG tablet Take 20 mg by mouth daily. 05/21/19  Yes [provider]  fluticasone (FLONASE) 50 MCG/ACT nasal spray Place 2 sprays into both nostrils daily. Patient taking differently: Place 2 sprays into both nostrils daily as needed for allergies or rhinitis.  02/24/18  Yes Gildardo Pounds, NP  hydrALAZINE (APRESOLINE) 100 MG tablet Take 100 mg by mouth 3 (three) times daily.   Yes [provider]  hydrOXYzine (ATARAX/VISTARIL) 25 MG tablet Take 25 mg by mouth in the morning.    Yes [provider]  ibuprofen (ADVIL) 200 MG tablet Take 200 mg by mouth every 6 (six) hours as needed for headache or mild pain.   Yes [provider]  traZODone (DESYREL) 50 MG tablet Take 50 mg by mouth See admin instructions. Take 50 mg by mouth in the evening and at bedtime   Yes [provider]  VENTOLIN HFA 108 (90 Base) MCG/ACT inhaler INHALE 2 PUFFS INTO THE LUNGS EVERY 6 (SIX) HOURS AS NEEDED FOR WHEEZING OR SHORTNESS OF BREATH. Patient taking differently: Inhale 2 puffs into the lungs every 6 (six) hours as needed for wheezing or shortness of breath.  05/17/19  Yes Charlott Rakes, MD  Vitamin D, Ergocalciferol, (DRISDOL) 1.25 MG (50000 UNIT) CAPS capsule Take 50,000 Units by mouth every 7 (seven) days.   Yes [provider]  Accu-Chek FastClix Lancets MISC Use as instructed. Inject into the skin twice daily 05/31/18   Gildardo Pounds, NP  amLODipine (NORVASC) 10 MG tablet Take 1 tablet (10 mg total) by mouth daily. 11/13/18 06/27/19  Gildardo Pounds, NP  atorvastatin (LIPITOR) 80 MG tablet Take 1 tablet (80 mg total) by mouth  daily at 6 PM. Patient not taking: Reported on 06/27/2019 11/13/18   Gildardo Pounds, NP  Blood Glucose Calibration (ACCU-CHEK GUIDE CONTROL) LIQD 1 each by In Vitro route once as needed for up to 1 dose. 05/31/18   Gildardo Pounds, NP  Blood Pressure Monitor DEVI Please provide  patient with insurance approved blood pressure monitor 11/13/18   Gildardo Pounds, NP  ferrous sulfate 325 (65 FE) MG EC tablet Take 1 tablet (325 mg total) by mouth 2 (two) times daily. Patient not taking: Reported on 06/27/2019 10/13/17 06/27/19  Jean Rosenthal, MD  glucose blood (ACCU-CHEK GUIDE) test strip Use as instructed. Check blood glucose by fingerstick twice per day. 05/31/18   Gildardo Pounds, NP  lisinopril (ZESTRIL) 40 MG tablet Take 1 tablet (40 mg total) by mouth daily. 11/13/18 06/27/19  Gildardo Pounds, NP  Misc. Devices MISC Please provide patient with insurance approved thigh high compression stockings for swelling. 11/13/18   Gildardo Pounds, NP  Misc. Devices MISC Please provide patient with insurance approved adult diapers size 3XL for urinary incontinence. ICD-10 N39.46 01/04/19   Gildardo Pounds, NP  spironolactone (ALDACTONE) 25 MG tablet Take 1 tablet (25 mg total) by mouth daily. 11/13/18   Gildardo Pounds, NP     Critical care time: 35 mins     Georgann Housekeeper, AGACNP-BC [place] for personal pager PCCM on call pager [phone]  06/27/2019 9:28 PM  Patient seen examined, chart reviewed in conjunction with Mr Hoffman's evaluation.  Agree with above assessment and plan.

## 2019-06-27 NOTE — ED Provider Notes (Signed)
7:52 PM Blood pressure now 179/73.  Patient is awake, alert, interactive.  She describes ongoing mild dysesthesia distally in her legs, but otherwise states that she feels better. MRI reviewed, does not demonstrate acute new infarct, does demonstrate evidence for chronic microhemorrhage. Patient will require admission for further monitoring, management, hypertensive crisis, requiring continuous medication.   Carmin Muskrat, MD 06/27/19 [phone]

## 2019-06-27 NOTE — Code Documentation (Signed)
MD made aware the patient is at the highest dose of Cleviprex with BP remaining > 210. MD Pickering to order Cardene and switched medications.

## 2019-06-27 NOTE — ED Provider Notes (Signed)
[facility] EMERGENCY DEPARTMENT Provider Note   CSN: [id number] Arrival date & time: 06/27/19  1432     History No chief complaint on file.   Belinda Ramirez is a 44 y.o. female.  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 Patient was last seen normal at 2 PM. She reports that she was having lunch when she suddenly started not feeling well. She then noticed weakness in her left arm and leg. EMS noted left lower extremity weakness but no significant upper extremity symptoms. Patient denies a headache at this time. She was otherwise feeling normal today until this occurred. Blood sugar was within normal limits in transport.  The history is provided by the patient and the EMS personnel.       Past Medical History:  Diagnosis Date  . High cholesterol   . 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 Hypertension   . Obesity   . Sickle cell trait ([place])   . Stroke ([place])   . Urinary incontinence    For years. Resolved 5/19 spontaneously    Patient Active Problem List   Diagnosis Date Noted  . Neurological abnormality 03/28/2018  . Hemiplegia and hemiparesis following cerebral infarction affecting left non-dominant side ([place]) 02/26/2018  . Globus pharyngeus 09/26/2017  . Left leg swelling 09/26/2017  . Blurred vision 08/17/2017  . History of CVA (cerebrovascular accident) 08/17/2017  . Urinary incontinence   . Severe hypertension 07/21/2017  . PRES (posterior reversible encephalopathy syndrome) 07/11/2017  . CKD (chronic kidney disease), stage III 01/22/2017  . OSA (obstructive sleep apnea) 06/19/2015  . Morbid obesity due  to excess calories ([place])   . Hyperlipidemia   . History of cerebral hemorrhage 04/16/2015    Past Surgical History:  Procedure Laterality Date  . TUBAL LIGATION    . WISDOM TOOTH EXTRACTION       OB History   No obstetric history on file.     Family History  Problem Relation Age of Onset  . Heart attack Maternal Grandfather   . Diabetes Mother   . Hypertension Mother   . Cancer Father   . Heart disease Maternal Grandmother   . Diabetes Sister   . Diabetes Brother   . Breast cancer Maternal Aunt     Social History   Tobacco Use  . Smoking status: Never Smoker  . Smokeless tobacco: Never Used  Substance Use Topics  . Alcohol use: Yes    Alcohol/week: 0.0 standard drinks    Comment: occaisonal  . Drug use: No    Home Medications Prior to Admission medications   Medication Sig Start Date End Date Taking? Authorizing Provider  Accu-Chek FastClix Lancets MISC Use as instructed. Inject into the skin twice daily 05/31/18   Gildardo Pounds, NP  acetaminophen (TYLENOL) 500 MG tablet Take 2 tablets (1,000 mg total) by mouth every 6 (six) hours as needed. Patient not taking: Reported on 04/11/2018 07/19/17   McDonald, Maree Erie A, PA-C  amLODipine (NORVASC) 10 MG tablet Take 1 tablet (10 mg total) by mouth daily. 11/13/18 02/11/19  Gildardo Pounds, NP  aspirin EC 81 MG EC tablet Take 1 tablet (81 mg total) by  mouth daily. 04/01/18   Dorrell, Andree Elk, MD  atorvastatin (LIPITOR) 80 MG tablet Take 1 tablet (80 mg total) by mouth daily at 6 PM. 11/13/18   Gildardo Pounds, NP  Blood Glucose Calibration (ACCU-CHEK GUIDE CONTROL) LIQD 1 each by In Vitro route once as needed for up to 1 dose. 05/31/18   Gildardo Pounds, NP  Blood Pressure Monitor DEVI Please provide patient with insurance approved blood pressure monitor 11/13/18   Gildardo Pounds, NP  cloNIDine (CATAPRES) 0.2 MG tablet Take 0.2 mg by mouth 3 (three) times daily.    [provider]  escitalopram (LEXAPRO) 10 MG tablet  Take 10 mg by mouth daily.    [provider]  ferrous sulfate 325 (65 FE) MG EC tablet Take 1 tablet (325 mg total) by mouth 2 (two) times daily. 10/13/17 05/11/18  Jean Rosenthal, MD  fluticasone (FLONASE) 50 MCG/ACT nasal spray Place 2 sprays into both nostrils daily. 02/24/18   Gildardo Pounds, NP  glucose blood (ACCU-CHEK GUIDE) test strip Use as instructed. Check blood glucose by fingerstick twice per day. 05/31/18   Gildardo Pounds, NP  hydrALAZINE (APRESOLINE) 100 MG tablet Take 100 mg by mouth 3 (three) times daily.    [provider]  lisinopril (ZESTRIL) 40 MG tablet Take 1 tablet (40 mg total) by mouth daily. 11/13/18 02/11/19  Gildardo Pounds, NP  Misc. Devices MISC Please provide patient with insurance approved thigh high compression stockings for swelling. 11/13/18   Gildardo Pounds, NP  Misc. Devices MISC Please provide patient with insurance approved adult diapers size 3XL for urinary incontinence. ICD-10 N39.46 01/04/19   Gildardo Pounds, NP  spironolactone (ALDACTONE) 25 MG tablet Take 1 tablet (25 mg total) by mouth daily. 11/13/18   Gildardo Pounds, NP  traZODone (DESYREL) 50 MG tablet Take 50 mg by mouth at bedtime. Take 1/2 tablet at bedtime.    [provider]  VENTOLIN HFA 108 (90 Base) MCG/ACT inhaler INHALE 2 PUFFS INTO THE LUNGS EVERY 6 (SIX) HOURS AS NEEDED FOR WHEEZING OR SHORTNESS OF BREATH. 05/17/19   Charlott Rakes, MD    Allergies    Oxycodone, Percocet [oxycodone-acetaminophen], Amoxicillin, Aspirin, Clonidine derivatives, and Latex  Review of Systems   Review of Systems  All other systems reviewed and are negative.   Physical Exam Updated Vital Signs Wt (!) 136.4 kg   BMI 50.04 kg/m   Physical Exam Vitals and nursing note reviewed.  Constitutional:      General: She is not in acute distress.    Appearance: She is well-developed. She is obese.  HENT:     Head: Normocephalic and atraumatic.  Eyes:     Pupils: Pupils are  equal, round, and reactive to light.  Cardiovascular:     Rate and Rhythm: Normal rate and regular rhythm.     Pulses: Normal pulses.     Heart sounds: Normal heart sounds. No murmur. No friction rub.  Pulmonary:     Effort: Pulmonary effort is normal.     Breath sounds: Normal breath sounds. No wheezing or rales.  Abdominal:     General: Bowel sounds are normal. There is no distension.     Palpations: Abdomen is soft.     Tenderness: There is no abdominal tenderness. There is no guarding or rebound.  Musculoskeletal:        General: No tenderness. Normal range of motion.     Comments: No edema  Skin:  General: Skin is warm and dry.     Findings: No rash.  Neurological:     Mental Status: She is alert and oriented to person, place, and time.     Cranial Nerves: No cranial nerve deficit or dysarthria.     Motor: Weakness present.     Coordination: Finger-Nose-Finger Test normal.     Comments: Trace pronator drift in the left upper ext.  3/5 strength in the LLE.  5/5 strength in right upper and lower ext.  No notable aphasia or hemineglect.  Finger to nose wnl.  Psychiatric:     Comments: Calm and cooperative     ED Results / Procedures / Treatments   Labs (all labs ordered are listed, but only abnormal results are displayed) Labs Reviewed  CBC - Abnormal; Notable for the following components:      Result Value   RBC 5.35 (*)    MCV 78.3 (*)    MCH 24.7 (*)    RDW 16.4 (*)    All other components within normal limits  I-STAT CHEM 8, ED - Abnormal; Notable for the following components:   Creatinine, Ser 1.50 (*)    Glucose, Bld 140 (*)    Calcium, Ion 1.11 (*)    All other components within normal limits  CBG MONITORING, ED - Abnormal; Notable for the following components:   Glucose-Capillary 132 (*)    All other components within normal limits  SARS CORONAVIRUS 2 BY RT PCR (HOSPITAL ORDER, [facility])  DIFFERENTIAL  PROTIME-INR  APTT    COMPREHENSIVE METABOLIC PANEL  I-STAT BETA HCG BLOOD, ED ([facility], [facility], [facility] ONLY)    EKG None  Radiology CT HEAD CODE STROKE WO CONTRAST  Result Date: 06/27/2019 CLINICAL DATA:  Code stroke.  Acute neuro deficit.  Slurred speech EXAM: CT HEAD WITHOUT CONTRAST TECHNIQUE: Contiguous axial images were obtained from the base of the skull through the vertex without intravenous contrast. COMPARISON:  CT head 01/15/2019 FINDINGS: Brain: Generalized atrophy. Chronic microvascular ischemic change in the white matter. Chronic infarcts in the thalamus bilaterally and in the right caudate, unchanged. Negative for acute infarct, hemorrhage, mass Vascular: Negative for hyperdense vessel Skull: Negative Sinuses/Orbits: Negative Other: None ASPECTS (Andrew Stroke Program Early CT Score) - Ganglionic level infarction (caudate, lentiform nuclei, internal capsule, insula, M1-M3 cortex): 7 - Supraganglionic infarction (M4-M6 cortex): 3 Total score (0-10 with 10 being normal): 10 IMPRESSION: 1. No acute abnormality 2. ASPECTS is 10 3. Atrophy and chronic ischemic change in the white matter, thalamus, and basal ganglia. 4. Preliminary report texted to Dr. Leonel Ramsay via [facility] Electronically Signed   By: Franchot Gallo M.D.   On: 06/27/2019 14:47    Procedures Procedures (including critical care time)  Medications Ordered in ED Medications  sodium chloride flush (NS) 0.9 % injection 3 mL (has no administration in time range)    ED Course  I have reviewed the triage vital signs and the nursing notes.  Pertinent labs & imaging results that were available during my care of the patient were reviewed by me and considered in my medical decision making (see chart for details).    MDM Rules/Calculators/A&P                      44 year old female with prior hypertension, PR ES syndrome, nonhemorrhagic CVA who is presenting today with sudden onset of left-sided weakness. Was last seen normal at 2 PM. On exam she has left  lower  extremity weakness and mild left upper extremity drift. In 2020 when she had her last stroke it did also affect her left side. She otherwise been feeling normal today. Neurology at bedside and patient went directly to the CT scanner as her airway was intact. Blood sugar 132 today. Patient's head CT is negative for acute bleed or acute infarct.  However she is hypertensive at 220/110 and has a prior history of PRES.  Concern for small vessel stroke versus PR ES versus hypertensive urgency.  Patient started on clevidipine drip with goal of blood pressure of 1 94-1 80 systolic.  Neurology requested MRI to evaluate for the above.  If MRI is negative she can be treated with labetalol as needed for blood pressure.  She will need admission for further work-up once MRI has returned.  MDM Number of Diagnoses or Management Options   Amount and/or Complexity of Data Reviewed Clinical lab tests: ordered and reviewed Tests in the radiology section of CPT: ordered and reviewed Tests in the medicine section of CPT: ordered and reviewed Decide to obtain previous medical records or to obtain history from someone other than the patient: yes Obtain history from someone other than the patient: yes Review and summarize past medical records: yes Discuss the patient with other providers: yes Independent visualization of images, tracings, or specimens: yes  Risk of Complications, Morbidity, and/or Mortality Presenting problems: high Diagnostic procedures: low Management options: moderate  Critical Care Total time providing critical care: 30-74 minutes  Patient Progress Patient progress: stable  CRITICAL CARE Performed by: Khanh Cordner Total critical care time: 30 minutes Critical care time was exclusive of separately billable procedures and treating other patients. Critical care was necessary to treat or prevent imminent or life-threatening deterioration. Critical care was time spent personally  by me on the following activities: development of treatment plan with patient and/or surrogate as well as nursing, discussions with consultants, evaluation of patient's response to treatment, examination of patient, obtaining history from patient or surrogate, ordering and performing treatments and interventions, ordering and review of laboratory studies, ordering and review of radiographic studies, pulse oximetry and re-evaluation of patient's condition.  Final Clinical Impression(s) / ED Diagnoses Final diagnoses:  None    Rx / DC Orders ED Discharge Orders    None       Blanchie Dessert, MD 06/27/19 [phone]

## 2019-06-27 NOTE — Consult Note (Signed)
Neurology Consultation Reason for Consult: Left-sided weakness Referring Physician: Windy Carina, W  CC: Left-sided numbness/weakness  History is obtained from: Patient  HPI: Belinda Ramirez is a 44 y.o. female with a history of intracranial hemorrhage, stroke who presents with transient left-sided numbness and weakness.  She was in her normal state of health until around 2 PM.  She stood up and noticed that the left side of her face felt numb, this then spread to the left side of her arm and involve weakness.  This was worsening and therefore EMS was called. En route, she   In the past, she has had episodes of paresthesia associated with headache.  Today, she denies any headache or paresthesia, just numbness and weakness that is resolving.  She has a history of high blood pressure, and states that it usually runs in the 200s.  LKW: 2 PM tpa given?: no, improving symptoms, history of IPH   ROS: A 14 point ROS was performed and is negative except as noted in the HPI.   Past Medical History:  Diagnosis Date  . High cholesterol   . 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 Hypertension   . Obesity   . Sickle cell trait ([place])   . Stroke ([place])   . Urinary incontinence    For years. Resolved 5/19 spontaneously     Family History  Problem Relation Age of Onset  . Heart attack Maternal Grandfather   . Diabetes Mother   . Hypertension Mother   . Cancer Father   . Heart disease Maternal Grandmother   . Diabetes Sister   . Diabetes Brother   . Breast cancer Maternal Aunt      Social History:  reports that she has never smoked. She has never used smokeless tobacco. She reports current alcohol use. She reports that she does not use drugs.   Exam: Current vital signs: BP (!) 179/73 (BP  Location: Left Arm)   Pulse (!) 110   Resp 20   Wt (!) 136.4 kg   SpO2 97%   BMI 50.04 kg/m  Vital signs in last 24 hours: Pulse Rate:  [77-128] 110 (05/12 1730) Resp:  [15-29] 20 (05/12 1730) BP: (172-241)/(73-133) 179/73 (05/12 1819) SpO2:  [95 %-99 %] 97 % (05/12 1730) Weight:  [136.4 kg] 136.4 kg (05/12 1400)   Physical Exam  Constitutional: Appears well-developed and well-nourished.  Psych: Affect appropriate to situation Eyes: No scleral injection HENT: No OP obstrucion MSK: no joint deformities.  Cardiovascular: Normal rate and regular rhythm.  Respiratory: Effort normal, non-labored breathing GI: Soft.  No distension. There is no tenderness.  Skin: WDI  Neuro: Mental Status: Patient is awake, alert, oriented to person, place, month, year, and situation. Patient is able to give a clear and coherent history. No signs of aphasia, she gives inconsistent answers when trying to test neglect, slightly unclear.  I do not think that this is definite neglect, but it is unusual. Cranial Nerves: II: Visual Fields are full. Pupils are equal, round, and reactive to light.   III,IV, VI: EOMI without ptosis or diploplia.  V: Facial sensation is reduced on the left VII: Facial movement is symmetric.  VIII: hearing is intact to voice X: Uvula elevates symmetrically XI: Shoulder shrug is symmetric. XII: tongue is midline without atrophy or  fasciculations.  Motor: Tone is normal. Bulk is normal. 5/5 strength was present on the right, 4+/5 on the left Sensory: Sensation is diminished in the left arm Cerebellar: No clear ataxia  I have reviewed labs in epic and the results pertinent to this consultation are: Creatinine 1.4  I have reviewed the images obtained: CT/CTA-negative  Impression: 44 year old female with a history of stroke and hemorrhage with severe hypertension who presents with left-sided transient numbness and weakness.  Given her history of posterior reversible  encephalopathy syndrome, her blood pressure was lowered cautiously with Cleviprex.  Given that she commonly is in the 200s, I do not know how aggressively this needs to be controlled unless there is evidence for press on MRI.  There has been question of migraines in the past, she has neither the positive symptoms nor headache concerning for migraine in the past.  She has significant stroke risk factor in her hypertension in previous strokes, and therefore I would favor treating this as TIA unless MRI shows evidence of pres.  Recommendations: 1) MRI brain, if positive for stroke then permissive hypertension to 220/120 otherwise would treat.  If there is evidence for PRES, then she will need titrated drips for strict control.  If MRI is negative, or demonstrate stroke: - HgbA1c, fasting lipid panel - Frequent neuro checks - Echocardiogram - Prophylactic therapy-Antiplatelet med: Aspirin - dose 325mg  PO or 300mg  PR - Risk factor modification - Telemetry monitoring - PT consult, OT consult, Speech consult - Stroke team to follow   Roland Rack, MD [facility] [phone]  If 7pm- 7am, please page neurology on call as listed in [place].

## 2019-06-27 NOTE — Code Documentation (Addendum)
44 yo female with Hx of ICH and PRES coming from home where she noted to have sudden onset of left face numbness at 1400. Pt reported that her left arm weakness worsened and she called EMS. [facility] activated a Code Stroke.   Stroke Team met patient upon arrival to the ED. EDP Plunkett cleared airway and pt taken to CT. CT Head negative for hemorrhage per MD Leonel Ramsay. Labs drawn and IV started for CTA. CTA completed and patient brought back to the room.   Placed on cardiac monitor. BP noted to be 241/104. Order made for BP to be < 180. Started Cleviprex and titrated per order.   Not a tPA candidate due to hx of ICH. Not an IR candidate due to no LVO noted on imaging. Pt to be admitted. q2 mNIHSS and VS.

## 2019-06-28 ENCOUNTER — Inpatient Hospital Stay (HOSPITAL_COMMUNITY): Payer: Medicaid Other

## 2019-06-28 DIAGNOSIS — I169 Hypertensive crisis, unspecified: Secondary | ICD-10-CM

## 2019-06-28 LAB — BASIC METABOLIC PANEL
Anion gap: 10 (ref 5–15)
Anion gap: 11 (ref 5–15)
BUN: 11 mg/dL (ref 6–20)
BUN: 12 mg/dL (ref 6–20)
CO2: 22 mmol/L (ref 22–32)
CO2: 24 mmol/L (ref 22–32)
Calcium: 8.6 mg/dL — ABNORMAL LOW (ref 8.9–10.3)
Calcium: 8.7 mg/dL — ABNORMAL LOW (ref 8.9–10.3)
Chloride: 106 mmol/L (ref 98–111)
Chloride: 107 mmol/L (ref 98–111)
Creatinine, Ser: 1.55 mg/dL — ABNORMAL HIGH (ref 0.44–1.00)
Creatinine, Ser: 1.64 mg/dL — ABNORMAL HIGH (ref 0.44–1.00)
GFR calc Af Amer: 43 mL/min — ABNORMAL LOW (ref >60)
GFR calc Af Amer: 46 mL/min — ABNORMAL LOW (ref >60)
GFR calc non Af Amer: 37 mL/min — ABNORMAL LOW (ref >60)
GFR calc non Af Amer: 40 mL/min — ABNORMAL LOW (ref >60)
Glucose, Bld: 112 mg/dL — ABNORMAL HIGH (ref 70–99)
Glucose, Bld: 138 mg/dL — ABNORMAL HIGH (ref 70–99)
Potassium: 3.6 mmol/L (ref 3.5–5.1)
Potassium: 3.6 mmol/L (ref 3.5–5.1)
Sodium: 139 mmol/L (ref 135–145)
Sodium: 141 mmol/L (ref 135–145)

## 2019-06-28 LAB — GLUCOSE, CAPILLARY
Glucose-Capillary: 186 mg/dL — ABNORMAL HIGH (ref 70–99)
Glucose-Capillary: 171 mg/dL — ABNORMAL HIGH (ref 70–99)
Glucose-Capillary: 189 mg/dL — ABNORMAL HIGH (ref 70–99)

## 2019-06-28 LAB — LIPID PANEL
Cholesterol: 209 mg/dL — ABNORMAL HIGH (ref 0–200)
HDL: 79 mg/dL (ref >40)
LDL Cholesterol: 120 mg/dL — ABNORMAL HIGH (ref 0–99)
Total CHOL/HDL Ratio: 2.6 RATIO
Triglycerides: 51 mg/dL (ref <150)
VLDL: 10 mg/dL (ref 0–40)

## 2019-06-28 LAB — CBC
HCT: 40.4 % (ref 36.0–46.0)
Hemoglobin: 13.2 g/dL (ref 12.0–15.0)
MCH: 25 pg — ABNORMAL LOW (ref 26.0–34.0)
MCHC: 32.7 g/dL (ref 30.0–36.0)
MCV: 76.5 fL — ABNORMAL LOW (ref 80.0–100.0)
Platelets: 328 10*3/uL (ref 150–400)
RBC: 5.28 MIL/uL — ABNORMAL HIGH (ref 3.87–5.11)
RDW: 16.4 % — ABNORMAL HIGH (ref 11.5–15.5)
WBC: 10 10*3/uL (ref 4.0–10.5)
nRBC: 0 % (ref 0.0–0.2)

## 2019-06-28 LAB — MRSA PCR SCREENING: MRSA by PCR: NEGATIVE

## 2019-06-28 LAB — PHOSPHORUS: Phosphorus: 3.9 mg/dL (ref 2.5–4.6)

## 2019-06-28 LAB — HEMOGLOBIN A1C
Hgb A1c MFr Bld: 6.5 % — ABNORMAL HIGH (ref 4.8–5.6)
Mean Plasma Glucose: 139.85 mg/dL

## 2019-06-28 LAB — ECHOCARDIOGRAM COMPLETE: Weight: 4811.32 oz

## 2019-06-28 LAB — MAGNESIUM: Magnesium: 1.9 mg/dL (ref 1.7–2.4)

## 2019-06-28 MED ORDER — HYDRALAZINE HCL 20 MG/ML IJ SOLN: 5.0000 mg | INTRAMUSCULAR | Status: DC | PRN

## 2019-06-28 MED ORDER — POTASSIUM CHLORIDE CRYS ER 20 MEQ PO TBCR
40.0000 meq | EXTENDED_RELEASE_TABLET | Freq: Once | ORAL | Status: AC
  Administered 2019-06-28: 40 meq via ORAL
  Filled 2019-06-28: qty 2

## 2019-06-28 MED ORDER — STROKE: EARLY STAGES OF RECOVERY BOOK
Freq: Once | Status: AC
  Filled 2019-06-28: qty 1

## 2019-06-28 MED ORDER — POTASSIUM CHLORIDE 10 MEQ/100ML IV SOLN
10.0000 meq | INTRAVENOUS | Status: DC
  Administered 2019-06-28: 10 meq via INTRAVENOUS
  Filled 2019-06-28: qty 100

## 2019-06-28 MED ORDER — POTASSIUM CHLORIDE 20 MEQ/15ML (10%) PO SOLN: 40.0000 meq | Freq: Once | ORAL | Status: DC

## 2019-06-28 MED ORDER — LABETALOL HCL 5 MG/ML IV SOLN: 10.0000 mg | INTRAVENOUS | Status: DC | PRN

## 2019-06-28 MED ORDER — PERFLUTREN LIPID MICROSPHERE
1.0000 mL | INTRAVENOUS | Status: AC | PRN
  Administered 2019-06-28: 2 mL via INTRAVENOUS
  Filled 2019-06-28: qty 10

## 2019-06-28 MED ORDER — MAGNESIUM SULFATE 2 GM/50ML IV SOLN
2.0000 g | Freq: Once | INTRAVENOUS | Status: AC
  Administered 2019-06-28: 2 g via INTRAVENOUS
  Filled 2019-06-28: qty 50

## 2019-06-28 MED ORDER — ASPIRIN EC 81 MG PO TBEC
81.0000 mg | DELAYED_RELEASE_TABLET | Freq: Every day | ORAL | Status: DC
  Administered 2019-06-28 – 2019-06-30 (×3): 81 mg via ORAL
  Filled 2019-06-28 (×3): qty 1

## 2019-06-28 MED ORDER — INSULIN ASPART 100 UNIT/ML ~~LOC~~ SOLN
0.0000 [IU] | Freq: Three times a day (TID) | SUBCUTANEOUS | Status: DC
  Administered 2019-06-28 – 2019-06-29 (×4): 4 [IU] via SUBCUTANEOUS

## 2019-06-28 MED ORDER — INSULIN ASPART 100 UNIT/ML ~~LOC~~ SOLN
0.0000 [IU] | Freq: Every day | SUBCUTANEOUS | Status: DC
  Administered 2019-06-29: 2 [IU] via SUBCUTANEOUS

## 2019-06-28 MED ORDER — ACETAMINOPHEN 325 MG PO TABS
650.0000 mg | ORAL_TABLET | Freq: Four times a day (QID) | ORAL | Status: DC | PRN
  Filled 2019-06-28: qty 2

## 2019-06-28 MED ORDER — CHLORHEXIDINE GLUCONATE CLOTH 2 % EX PADS
6.0000 | MEDICATED_PAD | Freq: Every day | CUTANEOUS | Status: DC
  Administered 2019-06-28 – 2019-06-30 (×3): 6 via TOPICAL

## 2019-06-28 MED ORDER — CLOPIDOGREL BISULFATE 75 MG PO TABS
75.0000 mg | ORAL_TABLET | Freq: Every day | ORAL | Status: DC
  Administered 2019-06-28 – 2019-06-30 (×3): 75 mg via ORAL
  Filled 2019-06-28 (×3): qty 1

## 2019-06-28 MED ORDER — PERFLUTREN LIPID MICROSPHERE
INTRAVENOUS | Status: AC
  Filled 2019-06-28: qty 10

## 2019-06-28 MED ORDER — ESCITALOPRAM OXALATE 10 MG PO TABS
20.0000 mg | ORAL_TABLET | Freq: Every day | ORAL | Status: DC
  Administered 2019-06-28 – 2019-06-30 (×3): 20 mg via ORAL
  Filled 2019-06-28 (×3): qty 2

## 2019-06-28 MED ORDER — AMLODIPINE BESYLATE 10 MG PO TABS
10.0000 mg | ORAL_TABLET | Freq: Every day | ORAL | Status: DC
  Administered 2019-06-28 – 2019-06-30 (×3): 10 mg via ORAL
  Filled 2019-06-28 (×3): qty 1

## 2019-06-28 MED ORDER — LISINOPRIL 20 MG PO TABS
40.0000 mg | ORAL_TABLET | Freq: Every day | ORAL | Status: DC
  Administered 2019-06-28 – 2019-06-30 (×3): 40 mg via ORAL
  Filled 2019-06-28: qty 2
  Filled 2019-06-28: qty 4
  Filled 2019-06-28: qty 2

## 2019-06-28 MED ORDER — ATORVASTATIN CALCIUM 80 MG PO TABS
80.0000 mg | ORAL_TABLET | Freq: Every day | ORAL | Status: DC
  Administered 2019-06-28 – 2019-06-30 (×3): 80 mg via ORAL
  Filled 2019-06-28 (×2): qty 1
  Filled 2019-06-28: qty 2

## 2019-06-28 MED ORDER — CLONIDINE HCL 0.1 MG PO TABS
0.2000 mg | ORAL_TABLET | Freq: Three times a day (TID) | ORAL | Status: DC
  Administered 2019-06-28 – 2019-06-30 (×7): 0.2 mg via ORAL
  Filled 2019-06-28 (×3): qty 2
  Filled 2019-06-28: qty 1
  Filled 2019-06-28: qty 2
  Filled 2019-06-28 (×2): qty 1

## 2019-06-28 NOTE — Progress Notes (Signed)
NAME:  Belinda Ramirez, MRN:  [id number], DOB:  09/06/1975, LOS: 1 ADMISSION DATE:  06/27/2019, CONSULTATION DATE:  5/12 REFERRING MD:  [name] EDP, CHIEF COMPLAINT:  Hypertension   History of present illness   44 year old female with past medical history as below, which is significant for intracranial hemorrhage, multiple strokes, posterior reversible encephalopathy syndrome, obesity, and resistant hypertension.  [facility] where she has been on several different regimens for hypertension, none of which have truly taken.  Current regimen includes amlodipine 10 mg daily, clonidine 0.2 mg 3 times daily, hydralazine 100 mg 3 times daily, and lisinopril 40 mg daily.  She had some success with clonidine transdermally in the past, however, she ran to insurance issues and is no longer able to obtain this.  She states it is not unusual for her blood pressure systolically to be in the 200 range.  In the morning hours of 5/12 after eating breakfast she first noticed numbness and tingling of her tongue and the left side of her face.  This slowly progressed to include her left arm and left leg.  She also describes weakness in her left arm.  Blood pressure upon arrival to the emergency department was 241/104.  EKG without acute ischemic changes. She was started immediately on clevidipine infusion for cautious lowering of blood pressure.  She was evaluated by neurology in the emergency department who recommended MRI which did not indicate acute abnormality or evidence of PRES. Pt was started on nicardipine + clevidipine and PCCM asked to admit.  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  CTA head/neck 5/12 > No acute large or medium vessel occlusion MRI brain 5/12 > No evidence of acute intracranial abnormality, including acute infarction. Redemonstrated advanced chronic small vessel ischemic disease with chronic lacunar infarcts in the cerebral white matter, right basal ganglia, thalami and pons. As before, there are numerous supratentorial and infratentorial chronic microhemorrhages.  Micro Data:  COVID 5/12 neg  Antimicrobials:    Interim history/subjective:  NAE since admission. Pt currently on nicardipine drip at 2 mg/hr. BP 170s-190s/80s, HR 80s. SpO2 98% on RA. A&Ox3, mentating well.  Says she follows with Dr. [name] at [facility]; she reports that her nephrologist and PCP have both recently made BP medication changes, however this is unclear.   Objective   Blood pressure (!) 187/87, pulse 84, temperature 98.1 F (36.7 C), temperature source Oral, resp. rate (!) 24, weight (!) 136.4 kg, SpO2 97 %.        Intake/Output Summary (Last 24 hours) at 06/28/2019 0650 Last data filed at 06/28/2019 0600 Gross per 24 hour  Intake 641.05 ml  Output 600 ml  Net 41.05 ml   Filed Weights   06/27/19 1400  Weight: (!) 136.4 kg    Examination: Gen: Well appearing in NAD. A&O x3. Answers questions appropriately HEENT: atraumatic, normocephalic, sclera anicteric, EOMI, PERRLA, MMM. Neck: no cervical lymphadenopathy or thyromegaly, no JVD Heart: RRR, S1, S2, no M/R/G, no chest wall tenderness Lungs: CTAB, no crackles or wheezes, normal work of breathing Abdomen: Normoactive bowel sounds, soft, NTND, no rebound/guarding Extremities: no clubbing, cyanosis,  or edema: pulses are +2 in bilateral upper and lower extremities Neuro: CN II-XI grossly intact. No focal deficits. Skin:  No rashes, lesions  Psych: Normal mood and affect.     Resolved Hospital Problem list     Assessment & Plan:   44 yo F  with h/o ICH, CVA, and resistant HTN here with hypertensive crisis.   Hypertensive crisis: Pt has long history of resistant HTN refractory to multiple regimens. Current regimen includes amlodipine 10 qd, clonidine 0.2 tid, hydralazine 100 tid, lisinopril 40 qd, however she indicates that it's not uncommon that her BP is in 884Z systolic. She reports good compliance and no missed doses; she is currently undergoing an outpatient workup. MRI brain from ED showed no evidence of CVA, ICH, PRES, or other acute intracranial process. Pt responsive to nicardipine infusion overnight, with BP at goal 170s-190s/80s. She says that her nephrologist and PCP have been changing a lot of her medicines, but this is unclear. We will restart some of her home meds today to try to stabilize her BP (may be rebounding) - Goal SBP 160-180 - titrate off nicardipine infusion  - Restart home meds: amlodipine 10 qd, clonidine 0.2 tid, lisinopril 40 qd - Will restart hydralazine 100 tid and spironolactone tomorrow  - Labetalol and hydralazine prn for SBP > 180 - Inpatient workup for resistant HTN: renal dopplers (scheduled as outpt but we will get them done here); pt had normal metanephrines in 2018, normal renin/RAS/aldosterone in 2018 - Reports she has not undergone sleep study; denies sx but has OSA anatomy - TTE pending - Telemetry  TIA: Pt presented with paresthesia of L face/tongue which extended to L arm and leg. Brain MRI, CTA head/neck, and CT head noncon without acute intracranial processes. Neuro consulted and following, think this is most likely a TIA related to HTN. - Neuro consulted and following, appreciate recs  - DAPT with ASA 325 and Plavix 75 per neuro - Frequent neuro checks - PT/OT/Speech  Risk of OSA: Pt agreed to enroll in Neuro study on post-stroke risk for OSA   CKD  Hyperkalemia: Cr 1.4 on presentation, appears around baseline.  - Optimize hydration  - Repeat BMP  T2DM: A1c 6.5 on 5/12. Sugars  well controlled however has not eaten  - Monitor glucose now that she is on diet  - SSI  Hypercholesterolemia: TC 209 / LDL-c 120 / HDL 79 / TG 51 on 5/12.  - Continue home atorva 80   Depression: Continue home Lexapro  FEN:  - Repleting K, switched to PO  - Regular diet carb adj  Best practice:  Diet: Regular diet  Pain/Anxiety/Delirium protocol (if indicated): NA VAP protocol (if indicated): NA DVT prophylaxis: SQH GI prophylaxis: PPI Glucose control: SSI Mobility:OOB with assist Code Status: FULL Family Communication: Patient and husband updated Disposition: ICU   Ivin Poot MS4

## 2019-06-28 NOTE — Evaluation (Signed)
Occupational Therapy Evaluation Patient Details Name: Belinda Ramirez MRN: [id number] DOB: [birth year] Today's Date: 06/28/2019    History of Present Illness History of intracranial hemorrhage, has severe hypertension and has been compliant with an extensive anti-hypertensive regimen.  She presented yesterday with numbness of left face/tongue with weakness.  Her blood pressure was significantly elevated.  Received labetalol and cleviprex yesterday AM.  Neurology saw her, CT head/CTA/MRI all showed no acute process   Clinical Impression   Belinda Ramirez is a 44 year old woman admitted to hospital with possible stroke. Imaging negative for stroke but continues to have malignant hypertension. ON evaluation patient presents with residual left sided weakness from prior stroke, reports of impaired sensation in LLE, reports of visual disturbance, impaired balance and decreased activity tolerance resulting in decreased independence with ADLs and functional mobility. Patient will benefit from skilled OT services to improve deficits and learn compensatory strategies needed for ADLs, IADLs and community mobility.     Follow Up Recommendations  Outpatient OT . Patient reports not "liking" Home Health therapy in the past and potentially not actually getting treatment. OP therapy may be a better option.   Equipment Recommendations       Recommendations for Other Services       Precautions / Restrictions Precautions Precautions: Fall Restrictions Weight Bearing Restrictions: No      Mobility Bed Mobility               General bed mobility comments: Min assist for hand hold to transfer to side of bed.  Transfers Overall transfer level: Needs assistance Equipment used: Rolling walker (2 wheeled) Transfers: Sit to/from Stand Sit to Stand: Min guard         General transfer comment: Patient reports weak LLE at baseline. Reports numbness/tingling in LLE. Patient ambulated around  bed and into chair with cga. No overt loss of balance. Patient required verbal cues to manuever walker. Slow gait.    Balance Overall balance assessment: Needs assistance Sitting-balance support: No upper extremity supported;Feet supported Sitting balance-Leahy Scale: Good     Standing balance support: Bilateral upper extremity supported Standing balance-Leahy Scale: Fair                             ADL either performed or assessed with clinical judgement   ADL Overall ADL's : Needs assistance/impaired Eating/Feeding: Independent   Grooming: Independent   Upper Body Bathing: Set up   Lower Body Bathing: Minimal assistance   Upper Body Dressing : Set up   Lower Body Dressing: Moderate assistance Lower Body Dressing Details (indicate cue type and reason): required assistance to donn socks, donn underwear - reports difficulty reaching down for lower bodyd ressing. Toilet Transfer: Moderate assistance Toilet Transfer Details (indicate cue type and reason): Min assist for hygiene and clothing management. Toileting- Water quality scientist and Hygiene: Minimal assistance   Tub/ Banker: Min guard;Shower seat   Functional mobility during ADLs: Min guard General ADL Comments: Min guard for ambulation as patient reports hx of falls, impaired balance and mild complaints of dizziness.     Vision Baseline Vision/History: Wears glasses Vision Assessment?: Vision impaired- to be further tested in functional context Additional Comments: No focal visual field cut or complaints of diplopia. Reports her vision "is kind of moving." Reports hx of visual impairments after stroke. Reports new prescription for glasses.     Perception     Praxis      Pertinent Vitals/Pain  Pain Assessment: No/denies pain     Hand Dominance Right   Extremity/Trunk Assessment Upper Extremity Assessment Upper Extremity Assessment: RUE deficits/detail;LUE deficits/detail RUE Deficits /  Details: 4/5 strength in shoulder, elbow, tricep, forearm. 5/5 wrist strength. 4/5 grip strength. RUE Sensation: WNL RUE Coordination: WNL LUE Deficits / Details: Reports residual left sided weakness from prior stroke. 4-/5 shoulder strength, 4-/5 bicep/tricep, 4/5 wrist, 4-/5 grip strength. LUE Sensation: WNL LUE Coordination: WNL   Lower Extremity Assessment Lower Extremity Assessment: Defer to PT evaluation   Cervical / Trunk Assessment Cervical / Trunk Assessment: Normal   Communication Communication Communication: No difficulties   Cognition Arousal/Alertness: Awake/alert Behavior During Therapy: WFL for tasks assessed/performed Overall Cognitive Status: Within Functional Limits for tasks assessed                                     General Comments       Exercises     Shoulder Instructions      Home Living Family/patient expects to be discharged to:: Private residence Living Arrangements: Spouse/significant other Available Help at Discharge: Available PRN/intermittently;Family Type of Home: Apartment Home Access: Stairs to enter Technical brewer of Steps: 2 Entrance Stairs-Rails: Right Home Layout: One level     Bathroom Shower/Tub: Technical sales engineer: Yes   Home Equipment: Bedside commode;Walker - 4 wheels          Prior Functioning/Environment Level of Independence: Independent with assistive device(s)        Comments: Ambulates with rollator or cane. Uses 3 in one for bathing.        OT Problem List: Impaired vision/perception      OT Treatment/Interventions: Self-care/ADL training;Therapeutic exercise;Therapeutic activities;Patient/family education    OT Goals(Current goals can be found in the care plan section) Acute Rehab OT Goals Patient Stated Goal: I want to be as independent as I can be OT Goal Formulation: With patient Time For Goal Achievement: 07/12/19 Potential to Achieve Goals: Good   OT Frequency: Min 2X/week   Barriers to D/C:            Co-evaluation              AM-PAC OT "6 Clicks" Daily Activity     Outcome Measure Help from another person eating meals?: None Help from another person taking care of personal grooming?: None Help from another person toileting, which includes using toliet, bedpan, or urinal?: A Lot Help from another person bathing (including washing, rinsing, drying)?: A Little Help from another person to put on and taking off regular upper body clothing?: None Help from another person to put on and taking off regular lower body clothing?: A Little 6 Click Score: 20   End of Session Equipment Utilized During Treatment: Rolling walker;Gait belt Nurse Communication: Mobility status  Activity Tolerance: Patient tolerated treatment well Patient left: in chair;with call bell/phone within reach;with chair alarm set  OT Visit Diagnosis: Unsteadiness on feet (R26.81);Muscle weakness (generalized) (M62.81);Hemiplegia and hemiparesis(residual hemiparesis) Hemiplegia - Right/Left: Left Hemiplegia - dominant/non-dominant: Non-Dominant Hemiplegia - caused by: Cerebral infarction                Time: 4174-0814 OT Time Calculation (min): 54 min Charges:  OT General Charges $OT Visit: 1 Visit OT Evaluation $OT Eval Low Complexity: 1 Low OT Treatments $Self Care/Home Management : 38-52 mins  Kim Lauver, OTR/L [place]  Office [phone]  Loreen Bankson L Encarnacion Bole 06/28/2019, 12:09 PM

## 2019-06-28 NOTE — Progress Notes (Signed)
STROKE TEAM PROGRESS NOTE   INTERVAL HISTORY  I have personally reviewed history of presenting illness with the patient, electronic medical records and imaging films in PACS.  She presented with sudden onset of left-sided weakness and numbness in yesterday afternoon.  Symptoms improved after she came to the hospital lasted a couple of hours.  Blood pressure significantly elevated and she required to be started on nicardipine drip for blood pressure control.  She does have a history of longstanding hypertension which has been difficult to control and she has tried a variety of agents.  She has been seen by [name] in 2017 for lacunar stroke as well as in July 2020 for the lacunar stroke.  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  CT angiogram showed no large vessel stenosis in the neck of the brain.  LDL cholesterol is elevated 120 mg percent and hemoglobin A1c 6.5.  Echocardiogram, renal ultrasound and urine drug screen are pending.. She remains on IV nicardipine drip for hypertensive urgency Vitals:   06/28/19 0600 06/28/19 0730 06/28/19 0800 06/28/19 0900  BP: (!) 187/87  (!) 185/90 (!) 171/72  Pulse: 84  92 88  Resp: (!) 24  17 (!) 24  Temp:  98.3 F (36.8 C)    TempSrc:  Oral    SpO2: 97%  97% 96%  Weight:        CBC:  Recent Labs  Lab 06/27/19 1434 06/27/19 1434 06/27/19 1459 06/28/19 0409  WBC 9.4  --   --  10.0  NEUTROABS 6.3  --   --   --   HGB 13.2   < > 14.3 13.2  HCT 41.9   < > 42.0 40.4  MCV 78.3*  --   --  76.5*  PLT 318  --   --  328   < > = values in this interval not displayed.    Basic Metabolic Panel:  Recent Labs  Lab 06/27/19 2359 06/28/19 0409  NA 139 141  K 3.6 3.6  CL 106 107  CO2 22  24  GLUCOSE 138* 112*  BUN 12 11  CREATININE 1.64* 1.55*  CALCIUM 8.7* 8.6*  MG  --  1.9  PHOS  --  3.9   Lipid Panel:     Component Value Date/Time   CHOL 209 (H) 06/28/2019 0409   TRIG 51 06/28/2019 0409   HDL 79 06/28/2019 0409   CHOLHDL 2.6 06/28/2019 0409   VLDL 10 06/28/2019 0409   LDLCALC 120 (H) 06/28/2019 0409   HgbA1c:  Lab Results  Component Value Date   HGBA1C 6.5 (H) 06/27/2019   Urine Drug Screen:     Component Value Date/Time   LABOPIA NONE DETECTED 02/05/2018 0141   COCAINSCRNUR NONE DETECTED 02/05/2018 0141   LABBENZ NONE DETECTED 02/05/2018 0141   AMPHETMU NONE DETECTED 02/05/2018 0141   THCU NONE DETECTED 02/05/2018 0141   LABBARB NONE DETECTED 02/05/2018 0141    Alcohol Level No results found for: ETH  IMAGING past 24 hours CT Code Stroke CTA Head W/WO contrast  Result Date: 06/27/2019 CLINICAL DATA:  Focal neurological deficit.  Slurred speech. EXAM: CT ANGIOGRAPHY HEAD AND NECK TECHNIQUE: Multidetector CT imaging of the head and neck was performed using the standard  protocol during bolus administration of intravenous contrast. Multiplanar CT image reconstructions and MIPs were obtained to evaluate the vascular anatomy. Carotid stenosis measurements (when applicable) are obtained utilizing NASCET criteria, using the distal internal carotid diameter as the denominator. CONTRAST:  45mL OMNIPAQUE IOHEXOL 350 MG/ML SOLN COMPARISON:  Head CT earlier same day.  MRI 01/15/2019. FINDINGS: CTA NECK FINDINGS Aortic arch: Mild aortic ectasia and atherosclerosis. Branching pattern is normal. Right carotid system: Common carotid artery widely patent to the bifurcation. No atherosclerotic disease at the carotid bifurcation. Cervical ICA is mildly tortuous but normal otherwise. Left carotid system: Common carotid artery widely patent to the bifurcation. Minimal atherosclerotic plaque at the carotid bifurcation but no stenosis. Cervical ICA is tortuous but otherwise normal.  Vertebral arteries: Both vertebral artery origins are widely patent. Both vertebral arteries appear normal through the cervical region to the foramen magnum. Skeleton: Ordinary mid cervical spondylosis. Other neck: No mass or lymphadenopathy. Upper chest: Mild atelectasis or scar in the left upper lobe. Review of the MIP images confirms the above findings CTA HEAD FINDINGS Anterior circulation: Both internal carotid arteries are widely patent through the skull base and siphon regions. The anterior and middle cerebral vessels are patent without proximal stenosis, aneurysm or vascular malformation. No large or medium vessel occlusion is identified. Distal vessel atherosclerotic irregularity. Posterior circulation: Both vertebral arteries widely patent to the basilar. No basilar stenosis. Posterior circulation branch vessels are patent. Distal vessel atherosclerotic irregularity. Venous sinuses: Patent and normal. Anatomic variants: None significant. Review of the MIP images confirms the above findings IMPRESSION: No acute large or medium vessel occlusion. Mild atherosclerotic plaque. No vessel stenosis noted in the neck. No intracranial large or medium vessel stenosis. Distal vessel atherosclerotic irregularity. These results were communicated to Dr. Leonel Ramsay at 3:09 pmon 5/12/2021by text page via the [facility] messaging system. Electronically Signed   By: Nelson Chimes M.D.   On: 06/27/2019 15:11   CT Code Stroke CTA Neck W/WO contrast  Result Date: 06/27/2019 CLINICAL DATA:  Focal neurological deficit.  Slurred speech. EXAM: CT ANGIOGRAPHY HEAD AND NECK TECHNIQUE: Multidetector CT imaging of the head and neck was performed using the standard protocol during bolus administration of intravenous contrast. Multiplanar CT image reconstructions and MIPs were obtained to evaluate the vascular anatomy. Carotid stenosis measurements (when applicable) are obtained utilizing NASCET criteria, using the distal internal carotid  diameter as the denominator. CONTRAST:  73mL OMNIPAQUE IOHEXOL 350 MG/ML SOLN COMPARISON:  Head CT earlier same day.  MRI 01/15/2019. FINDINGS: CTA NECK FINDINGS Aortic arch: Mild aortic ectasia and atherosclerosis. Branching pattern is normal. Right carotid system: Common carotid artery widely patent to the bifurcation. No atherosclerotic disease at the carotid bifurcation. Cervical ICA is mildly tortuous but normal otherwise. Left carotid system: Common carotid artery widely patent to the bifurcation. Minimal atherosclerotic plaque at the carotid bifurcation but no stenosis. Cervical ICA is tortuous but otherwise normal. Vertebral arteries: Both vertebral artery origins are widely patent. Both vertebral arteries appear normal through the cervical region to the foramen magnum. Skeleton: Ordinary mid cervical spondylosis. Other neck: No mass or lymphadenopathy. Upper chest: Mild atelectasis or scar in the left upper lobe. Review of the MIP images confirms the above findings CTA HEAD FINDINGS Anterior circulation: Both internal carotid arteries are widely patent through the skull base and siphon regions. The anterior and middle cerebral vessels are patent without proximal stenosis, aneurysm or vascular malformation. No large or medium vessel occlusion is identified. Distal vessel atherosclerotic irregularity. Posterior circulation: Both vertebral  arteries widely patent to the basilar. No basilar stenosis. Posterior circulation branch vessels are patent. Distal vessel atherosclerotic irregularity. Venous sinuses: Patent and normal. Anatomic variants: None significant. Review of the MIP images confirms the above findings IMPRESSION: No acute large or medium vessel occlusion. Mild atherosclerotic plaque. No vessel stenosis noted in the neck. No intracranial large or medium vessel stenosis. Distal vessel atherosclerotic irregularity. These results were communicated to Dr. Leonel Ramsay at 3:09 pmon 5/12/2021by text page  via the [facility] messaging system. Electronically Signed   By: Nelson Chimes M.D.   On: 06/27/2019 15:11   MR BRAIN WO CONTRAST  Result Date: 06/27/2019 CLINICAL DATA:  Neuro deficit, acute, stroke suspected. Additional history provided: Left sided weakness. EXAM: MRI HEAD WITHOUT CONTRAST TECHNIQUE: Multiplanar, multiecho pulse sequences of the brain and surrounding structures were obtained without intravenous contrast. COMPARISON:  CT angiogram head/neck and non-contrast head CT performed earlier the same day, brain MRI 01/15/2019 FINDINGS: Brain: Redemonstrated advanced multifocal T2/FLAIR hyperintensity within the cerebral white matter which is nonspecific, but consistent with chronic small vessel ischemic disease. To a lesser degree, chronic small vessel ischemic changes are also present within the brainstem. Redemonstrated chronic lacunar infarcts within the cerebral white matter, right basal ganglia, bilateral thalami and left pons. Associated chronic hemorrhage at these sites. Also similar to prior MRI 01/15/2019 there are numerous chronic microhemorrhages widely scattered throughout the brain. Stable, mild generalized parenchymal atrophy. There is no acute infarct. No evidence of intracranial mass. No extra-axial fluid collection. No midline shift. Vascular: Expected proximal arterial flow voids. Skull and upper cervical spine: No focal marrow lesion. Sinuses/Orbits: Visualized orbits show no acute finding. Mild paranasal sinus mucosal thickening, most notably ethmoid. No significant mastoid effusion. IMPRESSION: 1. No evidence of acute intracranial abnormality, including acute infarction. 2. Redemonstrated advanced chronic small vessel ischemic disease with chronic lacunar infarcts in the cerebral white matter, right basal ganglia, thalami and pons. 3. Stable, mild generalized parenchymal atrophy. 4. As before, there are numerous supratentorial and infratentorial chronic microhemorrhages. 5. Mild paranasal  sinus mucosal thickening. Electronically Signed   By: Kellie Simmering DO   On: 06/27/2019 18:54   CT HEAD CODE STROKE WO CONTRAST  Result Date: 06/27/2019 CLINICAL DATA:  Code stroke.  Acute neuro deficit.  Slurred speech EXAM: CT HEAD WITHOUT CONTRAST TECHNIQUE: Contiguous axial images were obtained from the base of the skull through the vertex without intravenous contrast. COMPARISON:  CT head 01/15/2019 FINDINGS: Brain: Generalized atrophy. Chronic microvascular ischemic change in the white matter. Chronic infarcts in the thalamus bilaterally and in the right caudate, unchanged. Negative for acute infarct, hemorrhage, mass Vascular: Negative for hyperdense vessel Skull: Negative Sinuses/Orbits: Negative Other: None ASPECTS ([facility] Stroke Program Early CT Score) - Ganglionic level infarction (caudate, lentiform nuclei, internal capsule, insula, M1-M3 cortex): 7 - Supraganglionic infarction (M4-M6 cortex): 3 Total score (0-10 with 10 being normal): 10 IMPRESSION: 1. No acute abnormality 2. ASPECTS is 10 3. Atrophy and chronic ischemic change in the white matter, thalamus, and basal ganglia. 4. Preliminary report texted to Dr. Leonel Ramsay via [facility] Electronically Signed   By: Franchot Gallo M.D.   On: 06/27/2019 14:47    PHYSICAL EXAM Obese middle-aged African-American lady not in distress. . Afebrile. Head is nontraumatic. Neck is supple without bruit.    Cardiac exam no murmur or gallop. Lungs are clear to auscultation. Distal pulses are well felt. Neurological Exam ;  Awake  Alert oriented x 3. Normal speech and language.eye movements full without nystagmus.fundi were not  visualized. Vision acuity and fields appear normal. Hearing is normal. Palatal movements are normal. Face symmetric. Tongue midline. Normal strength, tone, reflexes and coordination. Normal sensation.  But mild subjective paresthesias in the left arm gait deferred.  NIH stroke scale 0.  Premorbid baseline modified Rankin scale 0. ABCD  2 square score 4 ASSESSMENT/PLAN Belinda Ramirez is a 44 y.o. female with history of small vessel disease infarcts, morbid obesity, HTN, HLD presenting with L sided numbness and weakness.   R brain TIA [place] from small vessel disease.  Code Stroke CT head No acute abnormality. Small vessel disease white matter, thalamus and basal ganglia. Atrophy. ASPECTS 10.     CTA head & neck no LVO. Mild atherosclerosis.   MRI  No acute abnormality. Small vessel disease. Numerous microhemorrhages. Sinus dz.  2D Echo pending LDL 120  HgbA1c 6.5  Heparin 5000 units sq tid for VTE prophylaxis  No antithrombotic (not taking aspirin daily prior to admission), now on No antithrombotic. Will add aspirin 81 and Plavix 75 for 3 weeks followed by aspirin 81 mg alone  Therapy recommendations: Pending  Disposition: Likely home  Followed by Dr. Tomi Likens as an OP     Hypertensive Urgency  BP as high as 241/111   On cardene - their goal < 185/110 - ok < 200 from stroke perspective  Dr. Leonie Man discussed w/ PCCM who will place orders . BP goal normotensive  Hyperlipidemia  Home meds:  lipitor 80, resumed in hospital  LDL 120, goal < 70  Continue statin at discharge  Diabetes type Controlled  HgbA1c 6.5, goal < 7.0  Other Stroke Risk Factors  ETOH use,  advised to drink no more than 1 drink(s) a day  Morbid Obesity, Body mass index is 50.04 kg/m., recommend weight loss, diet and exercise as appropriate   Hx stroke/TIA  03/2018 - Small R corona radiata and centrum semiovale infarcts  secondary to small vessel disease source in setting of uncontrolled HTN  2017 R basal ganglia infarct d/t small vessel disease.   Obstructive sleep apnea, had CPAP in the past but could not get approved through [facility]. Consider for Sleep Smart Research Study.    Sickle cell  Other Active Problems  Hx PRES 2018  CKD stage III . Renal US.  Pending  Hyperkalemia  Hospital day # 1 She presented with  transient left hemiparesis and sensory loss likely due to right brain subcortical TIA.  She has prior history of hypertensive basal ganglia hemorrhage as well as subcortical lacunar strokes and difficult to control hypertension.  She also appears to be at risk for sleep apnea but could not afford sleep study in the past.  Recommend taper nicardipine drip and use oral blood pressure medications as well as as needed IV labetalol hydralazine to keep systolic less than 696.  Start aspirin Plavix for 3 weeks followed by aspirin alone and aggressive risk factor modification.  Continue ongoing stroke work-up.  Consider possible participation in the sleep smart stroke study if interested and she will be given information to review and decide.  Discussed with patient and Dr. Lake Bells and answered questions.  This patient is critically ill and at significant risk of neurological worsening, death and care requires constant monitoring of vital signs, hemodynamics,respiratory and cardiac monitoring, extensive review of multiple databases, frequent neurological assessment, discussion with family, other specialists and medical decision making of high complexity.I have made any additions or clarifications directly to the above note.This critical care time does not reflect  procedure time, or teaching time or supervisory time of PA/NP/Med Resident etc but could involve care discussion time.  I spent 30 minutes of neurocritical care time  in the care of  this patient.    Antony Contras, MD Medical Director [place] Pager: [phone] 06/28/2019 2:01 PM  To contact Stroke Continuity provider, please refer to [email or web address] After hours, contact General Neurology

## 2019-06-28 NOTE — Progress Notes (Signed)
  Echocardiogram 2D Echocardiogram has been performed.  Jennette Dubin 06/28/2019, 2:14 PM

## 2019-06-28 NOTE — Progress Notes (Signed)
Attending:    Subjective: History of intracranial hemorrhage, has severe hypertension and has been compliant with an extensive anti-hypertensive regimen.  She presented yesterday with numbness of left face/tongue with weakness.  Her blood pressure was significantly elevated.  Received labetalol and cleviprex yesterday AM.  Neurology saw her, CT head/CTA/MRI all showed no acute process.  She was started on nicardipine after a brief course of cleviprex.  Since admission she has been feeling fine.  Her numbness has resolved.    Objective: Vitals:   06/28/19 0600 06/28/19 0730 06/28/19 0800 06/28/19 0900  BP: (!) 187/87  (!) 185/90 (!) 171/72  Pulse: 84  92 88  Resp: (!) 24  17 (!) 24  Temp:  98.3 F (36.8 C)    TempSrc:  Oral    SpO2: 97%  97% 96%  Weight:          Intake/Output Summary (Last 24 hours) at 06/28/2019 0941 Last data filed at 06/28/2019 0900 Gross per 24 hour  Intake 712.05 ml  Output 600 ml  Net 112.05 ml    General:  Drowsy, but resting comfortably in bed HENT: NCAT OP clear PULM: CTA B, normal effort CV: RRR, no mgr GI: BS+, soft, nontender MSK: normal bulk and tone Neuro: awake, alert, no distress, MAEW    CBC    Component Value Date/Time   WBC 10.0 06/28/2019 0409   RBC 5.28 (H) 06/28/2019 0409   HGB 13.2 06/28/2019 0409   HGB 10.9 (L) 10/12/2017 1549   HCT 40.4 06/28/2019 0409   HCT 34.3 10/12/2017 1549   PLT 328 06/28/2019 0409   PLT 351 10/12/2017 1549   MCV 76.5 (L) 06/28/2019 0409   MCV 80 10/12/2017 1549   MCH 25.0 (L) 06/28/2019 0409   MCHC 32.7 06/28/2019 0409   RDW 16.4 (H) 06/28/2019 0409   RDW 17.7 (H) 10/12/2017 1549   LYMPHSABS 2.5 06/27/2019 1434   MONOABS 0.5 06/27/2019 1434   EOSABS 0.1 06/27/2019 1434   BASOSABS 0.0 06/27/2019 1434    BMET    Component Value Date/Time   NA 141 06/28/2019 0409   NA 140 02/24/2018 1612   K 3.6 06/28/2019 0409   CL 107 06/28/2019 0409   CO2 24 06/28/2019 0409   GLUCOSE 112 (H) 06/28/2019  0409   BUN 11 06/28/2019 0409   BUN 24 02/24/2018 1612   CREATININE 1.55 (H) 06/28/2019 0409   CALCIUM 8.6 (L) 06/28/2019 0409   GFRNONAA 40 (L) 06/28/2019 0409   GFRAA 46 (L) 06/28/2019 0409    Impression/Plan: Acute hypertensive emergency> restart home medications (clonidine, lisinopril, amlodipine), tomorrow can start spironolactone and hydralazine; wean off nicardipine for goal SBP 160-180; check renal doppler (was pending from outpatient with nephrology) TIA: echo pending, ASA to start 325mg , PT/OT/SLP evaluation today; secondary prevention per neurology High risk of OSA: agree with neurology plan to enroll her in a clinical trial looking at sleep studies for patients post stroke DM2: at goal, monitor  Hyperlipidemia: atorvastatin Hypokalemia: convert to oral  My cc time 45 minutes  Roselie Awkward, MD [place] [facility] Pager: [phone] Cell: [phone] After 3pm or if no response, call [phone]

## 2019-06-29 ENCOUNTER — Inpatient Hospital Stay (HOSPITAL_COMMUNITY): Payer: Medicaid Other

## 2019-06-29 DIAGNOSIS — I10 Essential (primary) hypertension: Secondary | ICD-10-CM

## 2019-06-29 LAB — COMPREHENSIVE METABOLIC PANEL
ALT: 10 U/L (ref 0–44)
AST: 13 U/L — ABNORMAL LOW (ref 15–41)
Albumin: 2.8 g/dL — ABNORMAL LOW (ref 3.5–5.0)
Alkaline Phosphatase: 66 U/L (ref 38–126)
Anion gap: 11 (ref 5–15)
BUN: 19 mg/dL (ref 6–20)
CO2: 22 mmol/L (ref 22–32)
Calcium: 8.4 mg/dL — ABNORMAL LOW (ref 8.9–10.3)
Chloride: 107 mmol/L (ref 98–111)
Creatinine, Ser: 2.01 mg/dL — ABNORMAL HIGH (ref 0.44–1.00)
GFR calc Af Amer: 34 mL/min — ABNORMAL LOW (ref >60)
GFR calc non Af Amer: 29 mL/min — ABNORMAL LOW (ref >60)
Glucose, Bld: 112 mg/dL — ABNORMAL HIGH (ref 70–99)
Potassium: 4.6 mmol/L (ref 3.5–5.1)
Sodium: 140 mmol/L (ref 135–145)
Total Bilirubin: 0.9 mg/dL (ref 0.3–1.2)
Total Protein: 6.7 g/dL (ref 6.5–8.1)

## 2019-06-29 LAB — GLUCOSE, CAPILLARY
Glucose-Capillary: 101 mg/dL — ABNORMAL HIGH (ref 70–99)
Glucose-Capillary: 180 mg/dL — ABNORMAL HIGH (ref 70–99)
Glucose-Capillary: 77 mg/dL (ref 70–99)
Glucose-Capillary: 221 mg/dL — ABNORMAL HIGH (ref 70–99)

## 2019-06-29 MED ORDER — ASPIRIN 81 MG PO TBEC: 81.0000 mg | DELAYED_RELEASE_TABLET | Freq: Every day | ORAL | 0 refills | Status: DC

## 2019-06-29 MED ORDER — LISINOPRIL 40 MG PO TABS: 40.0000 mg | ORAL_TABLET | Freq: Every day | ORAL | 0 refills | Status: DC

## 2019-06-29 MED ORDER — CLOPIDOGREL BISULFATE 75 MG PO TABS: 75.0000 mg | ORAL_TABLET | Freq: Every day | ORAL | 0 refills | Status: DC

## 2019-06-29 MED ORDER — AMLODIPINE BESYLATE 10 MG PO TABS: 10.0000 mg | ORAL_TABLET | Freq: Every day | ORAL | 0 refills | Status: DC

## 2019-06-29 MED ORDER — CLONIDINE HCL 0.2 MG PO TABS: 0.2000 mg | ORAL_TABLET | Freq: Three times a day (TID) | ORAL | 0 refills | Status: DC

## 2019-06-29 MED ORDER — ATORVASTATIN CALCIUM 80 MG PO TABS: 80.0000 mg | ORAL_TABLET | Freq: Every day | ORAL | 0 refills | Status: DC

## 2019-06-29 NOTE — Evaluation (Addendum)
Clinical/Bedside Swallow Evaluation Patient Details  Name: Olinda Nola MRN: [id number] Date of Birth: 03/16/1975  Today's Date: 06/29/2019 Time: SLP Start Time (ACUTE ONLY): 1455 SLP Stop Time (ACUTE ONLY): 1520 SLP Time Calculation (min) (ACUTE ONLY): 25 min  Past Medical History:  Past Medical History:  Diagnosis Date  . High cholesterol   . 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 Hypertension   . Obesity   . Sickle cell trait ([place])   . Stroke ([place])   . Urinary incontinence    For years. Resolved 5/19 spontaneously   Past Surgical History:  Past Surgical History:  Procedure Laterality Date  . TUBAL LIGATION    . WISDOM TOOTH EXTRACTION     HPI:  Patient is a 44 year old female with a history of hypertension, nonischemic CVA in 04/06/2018 with left-sided residual deficits and PRES syndrome who is presenting today as a code stroke with symptoms of left arm/leg weakness. MRI No evidence of acute intracranial abnormality, including acute infarction, redemonstrated advanced chronic small vessel ischemic disease with chronic lacunar infarcts in the cerebral white matter, right basal ganglia, thalami and pons.   Assessment / Plan / Recommendation Clinical Impression  Pt reports difficulty with cognition since stroke in 03/2018 and described difficulty processing paperwork and instructions for medications and needs assist to complete. Kahli was able to store information to recall 1/4 words independently and 2/4 from list and 1/4 with choices. Inaccurate with clock drawing placing numbers 6-11 on right side of clock and 1-5 on left side needing prompt to identify error. She did exhibit anticipatory awareness and reported "I don't turn around when I'm walking when people say hi because  it makes me lose my balance." Pt should have someone supervise her medication and financal management and implement strategies. Recommend outpatient ST to help with this and ST will defer treatment.     SLP Visit Diagnosis: Cognitive communication deficit (R41.841)    Aspiration Risk       Diet Recommendation          Other  Recommendations     Follow up Recommendations Home health SLP      Frequency and Duration            Prognosis        Swallow Study   General HPI: Patient is a 44 year old female with a history of hypertension, nonischemic CVA in 04/06/2018 with left-sided residual deficits and PRES syndrome who is presenting today as a code stroke with symptoms of left arm/leg weakness. MRI No evidence of acute intracranial abnormality, including acute infarction, redemonstrated advanced chronic small vessel ischemic disease with chronic lacunar infarcts in the cerebral white matter, right basal ganglia, thalami and pons.    Oral/Motor/Sensory Function Overall Oral Motor/Sensory Function: Within functional limits   Ice Chips     Thin Liquid      Nectar Thick     Honey Thick     Puree     Solid            Houston Siren 06/29/2019,4:43 PM   Orbie Pyo [place].Ed Risk analyst [phone] Office [phone]

## 2019-06-29 NOTE — Evaluation (Signed)
Physical Therapy Evaluation Patient Details Name: Belinda Ramirez MRN: [id number] DOB: [birth year] Today's Date: 06/29/2019   History of Present Illness  44 yo female with onset of hypertensive crisis with L side weakness and worsening of stroke symptoms from 2020 was admitted.  Pt has been unable to work since stroke, but no new findings on MRI.  PMHx:  intracranial hemorrhage, HTN, and has been compliant with an extensive anti-hypertensive regimen.  She presented yesterday with numbness of left face/tongue with weakness.   Clinical Impression  Pt is struggling with focus on tasks, requiring mult repetitions of multimodal cues for all sequences of standing andstepping.  Pt is expecting to continue therapy and will attempt an admission to CIR if possible to get her independence in higher level with safer standing balance.  See 4x per week for preparation of CIR stay.     Follow Up Recommendations CIR    Equipment Recommendations  None recommended by PT    Recommendations for Other Services Rehab consult     Precautions / Restrictions Precautions Precautions: Fall Precaution Comments: monitor pulse, O2 sats Restrictions Weight Bearing Restrictions: No      Mobility  Bed Mobility Overal bed mobility: Needs Assistance Bed Mobility: Supine to Sit     Supine to sit: Min assist     General bed mobility comments: using bedrail  Transfers Overall transfer level: Needs assistance Equipment used: Rolling walker (2 wheeled) Transfers: Sit to/from Stand Sit to Stand: Min assist         General transfer comment: LLE is weak esp, cannot assist fully to stand and sidestep  Ambulation/Gait Ambulation/Gait assistance: Min guard;Min assist Gait Distance (Feet): 8 Feet Assistive device: Rolling walker (2 wheeled);1 person hand held assist Gait Pattern/deviations: Step-to pattern;Wide base of support;Shuffle;Decreased stride length Gait velocity: variable Gait velocity  interpretation: <1.8 ft/sec, indicate of risk for recurrent falls General Gait Details: shuffled sidesteps on side of bed with quick sitting   Stairs            Wheelchair Mobility    Modified Rankin (Stroke Patients Only)       Balance Overall balance assessment: Needs assistance Sitting-balance support: Feet supported Sitting balance-Leahy Scale: Fair Sitting balance - Comments: listed backward upon sitting from gait with listing backward Postural control: Posterior lean Standing balance support: Bilateral upper extremity supported;During functional activity Standing balance-Leahy Scale: Fair Standing balance comment: less than fair in dynamic standing                             Pertinent Vitals/Pain Pain Assessment: No/denies pain    Home Living Family/patient expects to be discharged to:: Private residence Living Arrangements: Spouse/significant other Available Help at Discharge: Available PRN/intermittently;Family Type of Home: Apartment Home Access: Stairs to enter Entrance Stairs-Rails: Right Entrance Stairs-Number of Steps: 2 Home Layout: One level Home Equipment: Bedside commode;Walker - 4 wheels      Prior Function Level of Independence: Independent with assistive device(s)         Comments: rollator for gait, takes sitting balths     Hand Dominance   Dominant Hand: Right    Extremity/Trunk Assessment   Upper Extremity Assessment Upper Extremity Assessment: Generalized weakness;RUE deficits/detail RUE Deficits / Details: 4- strength    Lower Extremity Assessment Lower Extremity Assessment: Generalized weakness    Cervical / Trunk Assessment Cervical / Trunk Assessment: Normal  Communication   Communication: No difficulties  Cognition Arousal/Alertness: Awake/alert Behavior During Therapy:  WFL for tasks assessed/performed Overall Cognitive Status: History of cognitive impairments - at baseline                                  General Comments: repetitive instructions for mobility wiht both verbal and tactile cues      General Comments      Exercises     Assessment/Plan    PT Assessment Patient needs continued PT services  PT Problem List Decreased strength;Decreased range of motion;Decreased activity tolerance;Decreased balance;Decreased mobility;Decreased coordination;Decreased cognition;Decreased knowledge of use of DME;Decreased safety awareness;Decreased knowledge of precautions       PT Treatment Interventions DME instruction;Gait training;Stair training;Therapeutic activities;Functional mobility training;Therapeutic exercise;Neuromuscular re-education;Balance training;Patient/family education    PT Goals (Current goals can be found in the Care Plan section)  Acute Rehab PT Goals Patient Stated Goal: to be independent PT Goal Formulation: With patient Time For Goal Achievement: 07/13/19 Potential to Achieve Goals: Good    Frequency Min 3X/week   Barriers to discharge Inaccessible home environment;Decreased caregiver support home with limited help, sign other is working    Co-evaluation               AM-PAC PT "6 Clicks" Mobility  Outcome Measure Help needed turning from your back to your side while in a flat bed without using bedrails?: A Little Help needed moving from lying on your back to sitting on the side of a flat bed without using bedrails?: A Little Help needed moving to and from a bed to a chair (including a wheelchair)?: A Little Help needed standing up from a chair using your arms (e.g., wheelchair or bedside chair)?: A Little Help needed to walk in hospital room?: A Little Help needed climbing 3-5 steps with a railing? : A Lot 6 Click Score: 17    End of Session Equipment Utilized During Treatment: Gait belt Activity Tolerance: Patient limited by fatigue;Treatment limited secondary to medical complications (Comment) Patient left: in bed;with call  bell/phone within reach;with bed alarm set Nurse Communication: Mobility status PT Visit Diagnosis: Unsteadiness on feet (R26.81);Muscle weakness (generalized) (M62.81);History of falling (Z91.81)    Time: 1610-9604 PT Time Calculation (min) (ACUTE ONLY): 33 min   Charges:   PT Evaluation $PT Eval Moderate Complexity: 1 Mod PT Treatments $Gait Training: 8-22 mins       Ramond Dial 06/29/2019, 11:42 PM  Mee Hives, PT MS Acute Rehab Dept. Number: [place]

## 2019-06-29 NOTE — Progress Notes (Signed)
Renal artery duplex       has been completed. Preliminary results can be found under CV proc through chart review. Dontez Hauss, BS, RDMS, RVT   

## 2019-06-29 NOTE — TOC Initial Note (Signed)
Transition of Care [facility]) - Initial/Assessment Note    Patient Details  Name: Belinda Ramirez MRN: [id number] Date of Birth: 1975/10/07  [facility]) CM/SW Contact:    Pollie Friar, RN Phone Number: 06/29/2019, 4:02 PM  Clinical Narrative:                 Pt is from home with fiance. She has a 3 in 1 but states she needs a bigger size. CM will order.  OT recommending outpatient therapy. Pt interested in attending [facility]. Orders in Epic and information on the AVS.  Pt may need transportation assist at d/c. She usually uses [facility].  TOC following for further d/c needs.   Expected Discharge Plan: OP Rehab Barriers to Discharge: Continued Medical Work up   Patient Goals and CMS Choice     Choice offered to / list presented to : Patient  Expected Discharge Plan and Services Expected Discharge Plan: OP Rehab   Discharge Planning Services: CM Consult   Living arrangements for the past 2 months: Apartment                                      Prior Living Arrangements/Services Living arrangements for the past 2 months: Apartment Lives with:: Significant Other Patient language and need for interpreter reviewed:: Yes        Need for Family Participation in Patient Care: Yes (Comment) Care giver support system in place?: (intermittent) Current home services: DME(3 in 1, cane) Criminal Activity/Legal Involvement Pertinent to Current Situation/Hospitalization: No - Comment as needed  Activities of Daily Living      Permission Sought/Granted                  Emotional Assessment Appearance:: Appears stated age Attitude/Demeanor/Rapport: Engaged Affect (typically observed): Accepting Orientation: : Oriented to Self, Oriented to  Time, Oriented to Place, Oriented to Situation   Psych Involvement: No (comment)  Admission diagnosis:  Hypertensive crisis [I16.9] Patient Active Problem List   Diagnosis Date Noted  . Hypertensive  crisis 06/27/2019  . Neurological abnormality 03/28/2018  . Hemiplegia and hemiparesis following cerebral infarction affecting left non-dominant side ([place]) 02/26/2018  . Globus pharyngeus 09/26/2017  . Left leg swelling 09/26/2017  . Blurred vision 08/17/2017  . History of CVA (cerebrovascular accident) 08/17/2017  . Urinary incontinence   . Severe hypertension 07/21/2017  . PRES (posterior reversible encephalopathy syndrome) 07/11/2017  . CKD (chronic kidney disease), stage III 01/22/2017  . OSA (obstructive sleep apnea) 06/19/2015  . Morbid obesity due to excess calories ([place])   . Hyperlipidemia   . History of cerebral hemorrhage 04/16/2015   PCP:  Gildardo Pounds, NP Pharmacy:   [facility] [address] [place] [address] Phone: [phone] Fax: [phone]     Social Determinants of Health (SDOH) Interventions    Readmission Risk Interventions No flowsheet data found.

## 2019-06-29 NOTE — Plan of Care (Signed)
  Problem: Safety: Goal: Ability to remain free from injury will improve Outcome: Progressing   Problem: Nutrition: Goal: Adequate nutrition will be maintained Outcome: Progressing   Problem: Education: Goal: Knowledge of General Education information will improve Description: Including pain rating scale, medication(s)/side effects and non-pharmacologic comfort measures Outcome: Progressing

## 2019-06-29 NOTE — Progress Notes (Signed)
Pt requires a bariatric size 3 in 1 d/t her her body habitus. A regular size 3 in 1would be unsafe for her to use.

## 2019-06-29 NOTE — Progress Notes (Signed)
Reattempt as time and pt allow.   06/29/19 0900  PT Visit Information  Last PT Received On 06/29/19  Reason Eval/Treat Not Completed Patient at procedure or test/unavailable   Mee Hives, PT MS Acute Rehab Dept. Number: [place]

## 2019-06-29 NOTE — Progress Notes (Signed)
STROKE TEAM PROGRESS NOTE   INTERVAL HISTORY Patient is sitting up in bed she is resting comfortably.  Echocardiogram shows normal ejection fraction without cardiac source of embolism.  She did participate in the sleep smart study study was suboptimal on the [facility] 3 monitor and will need to repeat it tonight Vitals:   06/28/19 2231 06/28/19 2256 06/29/19 0315 06/29/19 0743  BP: 132/72 (!) 154/80 (!) 113/51 114/65  Pulse:  65 62 61  Resp:  18 18 16   Temp:   98.6 F (37 C) 99.3 F (37.4 C)  TempSrc:    Oral  SpO2:  100% 99% 97%  Weight:       CBC:  Recent Labs  Lab 06/27/19 1434 06/27/19 1434 06/27/19 1459 06/28/19 0409  WBC 9.4  --   --  10.0  NEUTROABS 6.3  --   --   --   HGB 13.2   < > 14.3 13.2  HCT 41.9   < > 42.0 40.4  MCV 78.3*  --   --  76.5*  PLT 318  --   --  328   < > = values in this interval not displayed.   Basic Metabolic Panel:  Recent Labs  Lab 06/28/19 0409 06/29/19 0218  NA 141 140  K 3.6 4.6  CL 107 107  CO2 24 22  GLUCOSE 112* 112*  BUN 11 19  CREATININE 1.55* 2.01*  CALCIUM 8.6* 8.4*  MG 1.9  --   PHOS 3.9  --    Lipid Panel:     Component Value Date/Time   CHOL 209 (H) 06/28/2019 0409   TRIG 51 06/28/2019 0409   HDL 79 06/28/2019 0409   CHOLHDL 2.6 06/28/2019 0409   VLDL 10 06/28/2019 0409   LDLCALC 120 (H) 06/28/2019 0409   HgbA1c:  Lab Results  Component Value Date   HGBA1C 6.5 (H) 06/27/2019   Urine Drug Screen:     Component Value Date/Time   LABOPIA NONE DETECTED 02/05/2018 0141   COCAINSCRNUR NONE DETECTED 02/05/2018 0141   LABBENZ NONE DETECTED 02/05/2018 0141   AMPHETMU NONE DETECTED 02/05/2018 0141   THCU NONE DETECTED 02/05/2018 0141   LABBARB NONE DETECTED 02/05/2018 0141    Alcohol Level No results found for: [facility]  IMAGING past 24 hours ECHOCARDIOGRAM COMPLETE  Result Date: 06/28/2019    ECHOCARDIOGRAM REPORT   Patient Name:   Belinda Ramirez Date of Exam: 06/28/2019 Medical Rec #:  [id number]         Height:        65.0 in Accession #:    [id number]        Weight:       300.7 lb Date of Birth:  Sep 22, 1975         BSA:          2.352 m Patient Age:    44 years          BP:           140/57 mmHg Patient Gender: F                 HR:           69 bpm. Exam Location:  Inpatient Procedure: 2D Echo Indications:    Resistant Hypertension  History:        Patient has prior history of Echocardiogram examinations, most                 recent 03/29/2018. Risk Factors:Hypertension.  Sonographer:  Mikki Santee RDCS (AE) Referring Phys: [place]  1. Left ventricular ejection fraction, by estimation, is 65 to 70%. The left ventricle has normal function. The left ventricle has no regional wall motion abnormalities. There is moderate concentric left ventricular hypertrophy. Left ventricular diastolic parameters are consistent with Grade II diastolic dysfunction (pseudonormalization). Elevated left ventricular end-diastolic pressure.  2. Right ventricular systolic function is normal. The right ventricular size is normal.  3. Left atrial size was mildly dilated.  4. The mitral valve is grossly normal. No evidence of mitral valve regurgitation. No evidence of mitral stenosis.  5. The aortic valve was not well visualized. Aortic valve regurgitation is not visualized. No aortic stenosis is present.  6. The inferior vena cava is normal in size with greater than 50% respiratory variability, suggesting right atrial pressure of 3 mmHg. FINDINGS  Left Ventricle: Left ventricular ejection fraction, by estimation, is 65 to 70%. The left ventricle has normal function. The left ventricle has no regional wall motion abnormalities. The left ventricular internal cavity size was normal in size. There is  moderate concentric left ventricular hypertrophy. Left ventricular diastolic parameters are consistent with Grade II diastolic dysfunction (pseudonormalization). Elevated left ventricular end-diastolic pressure. Right  Ventricle: The right ventricular size is normal. No increase in right ventricular wall thickness. Right ventricular systolic function is normal. Left Atrium: Left atrial size was mildly dilated. Right Atrium: Right atrial size was not well visualized. Pericardium: There is no evidence of pericardial effusion. Mitral Valve: The mitral valve is grossly normal. No evidence of mitral valve regurgitation. No evidence of mitral valve stenosis. Tricuspid Valve: The tricuspid valve is grossly normal. Tricuspid valve regurgitation is not demonstrated. No evidence of tricuspid stenosis. Aortic Valve: The aortic valve was not well visualized. Aortic valve regurgitation is not visualized. No aortic stenosis is present. Pulmonic Valve: The pulmonic valve was not well visualized. Pulmonic valve regurgitation is not visualized. No evidence of pulmonic stenosis. Aorta: The aortic root, ascending aorta and aortic arch are all structurally normal, with no evidence of dilitation or obstruction. Venous: The inferior vena cava is normal in size with greater than 50% respiratory variability, suggesting right atrial pressure of 3 mmHg. IAS/Shunts: The interatrial septum was not well visualized.  LEFT VENTRICLE PLAX 2D LVIDd:         4.70 cm  Diastology LVIDs:         2.60 cm  LV e' lateral:   5.27 cm/s LV PW:         1.70 cm  LV E/e' lateral: 18.9 LV IVS:        1.70 cm  LV e' medial:    4.22 cm/s LVOT diam:     2.20 cm  LV E/e' medial:  23.6 LV SV:         72 LV SV Index:   31 LVOT Area:     3.80 cm  LEFT ATRIUM             Index LA diam:        4.00 cm 1.70 cm/m LA Vol (A2C):   70.0 ml 29.76 ml/m LA Vol (A4C):   69.9 ml 29.71 ml/m LA Biplane Vol: 71.1 ml 30.22 ml/m  AORTIC VALVE LVOT Vmax:   95.00 cm/s LVOT Vmean:  61.100 cm/s LVOT VTI:    0.189 m  AORTA Ao Root diam: 3.00 cm MITRAL VALVE MV Area (PHT): 2.48 cm    SHUNTS MV Decel Time: 306 msec  Systemic VTI:  0.19 m MV E velocity: 99.40 cm/s  Systemic Diam: 2.20 cm MV A  velocity: 93.10 cm/s MV E/A ratio:  1.07 Buford Dresser MD Electronically signed by Buford Dresser MD Signature Date/Time: 06/28/2019/6:15:05 PM    Final    VAS US RENAL ARTERY DUPLEX  Result Date: 06/29/2019 ABDOMINAL VISCERAL High Risk Factors: Hypertension. Limitations: Obesity and air/bowel gas. Performing Technologist: June Leap RDMS, RVT  Examination Guidelines: A complete evaluation includes B-mode imaging, spectral Doppler, color Doppler, and power Doppler as needed of all accessible portions of each vessel. Bilateral testing is considered an integral part of a complete examination. Limited examinations for reoccurring indications may be performed as noted.  Duplex Findings: +--------------------+--------+--------+------+--------+ Mesenteric          PSV cm/sEDV cm/sPlaqueComments +--------------------+--------+--------+------+--------+ Aorta at SMA           77      12                  +--------------------+--------+--------+------+--------+ Celiac Artery Origin  292                          +--------------------+--------+--------+------+--------+ SMA Proximal          290                          +--------------------+--------+--------+------+--------+    +------------------+--------+--------+--------------+ Right Renal ArteryPSV cm/sEDV cm/s   Comment     +------------------+--------+--------+--------------+ Origin                            not visualized +------------------+--------+--------+--------------+ Proximal                          not visualized +------------------+--------+--------+--------------+ Mid                  90      9                   +------------------+--------+--------+--------------+ Distal              101      18                  +------------------+--------+--------+--------------+ +-----------------+--------+--------+--------------+ Left Renal ArteryPSV cm/sEDV cm/s   Comment      +-----------------+--------+--------+--------------+ Origin                           not visualized +-----------------+--------+--------+--------------+ Proximal                         not visualized +-----------------+--------+--------+--------------+ Mid                 67      21                  +-----------------+--------+--------+--------------+ Distal              97      28                  +-----------------+--------+--------+--------------+  Technologist observations: Unable to obtain intrarenal waveforms for resistive index. +------------+--------+--------+--+-----------+--------+--------+----+ Right KidneyPSV cm/sEDV cm/sRILeft KidneyPSV cm/sEDV cm/sRI   +------------+--------+--------+--+-----------+--------+--------+----+ Upper Pole  Upper Pole                      +------------+--------+--------+--+-----------+--------+--------+----+ Mid                           Mid        47      19      0.60 +------------+--------+--------+--+-----------+--------+--------+----+ Lower Pole                    Lower Pole 25      8       0.70 +------------+--------+--------+--+-----------+--------+--------+----+ Hilar                         Hilar                           +------------+--------+--------+--+-----------+--------+--------+----+ +------------------+----+------------------+-----+ Right Kidney          Left Kidney             +------------------+----+------------------+-----+ RAR                   RAR                     +------------------+----+------------------+-----+ RAR (manual)          RAR (manual)            +------------------+----+------------------+-----+ Cortex                Cortex                  +------------------+----+------------------+-----+ Cortex thickness      Corex thickness         +------------------+----+------------------+-----+ Kidney length (cm)8.32Kidney length  (cm)10.89 +------------------+----+------------------+-----+   Summary: Renal:  Right: Limited exam due to body habitus. No evidence of renal artery        stenosis in visualized segments. Left:  Limited exam due to body habitus. No evidence of renal artery        stenosis in visualized segments. 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  *See table(s) above for measurements and observations.     Preliminary     PHYSICAL EXAM   Obese middle-aged African-American lady not in distress. . Afebrile. Head is nontraumatic. Neck is supple without bruit.    Cardiac exam no murmur or gallop. Lungs are clear to auscultation. Distal pulses are well felt. Neurological Exam ;  Awake  Alert oriented x 3. Normal speech and language.eye movements full without nystagmus.fundi were not visualized. Vision acuity and fields appear normal. Hearing is normal. Palatal movements are normal. Face symmetric. Tongue midline. Normal strength, tone, reflexes and coordination. Normal sensation.  But mild subjective paresthesias in the left arm gait deferred.  NIH stroke scale 0.  Premorbid baseline modified Rankin scale 0. ABCD 2 square score 4 ASSESSMENT/PLAN Ms. Belinda Ramirez is a 44 y.o. female with history of small vessel disease infarcts, morbid obesity, HTN, HLD presenting with L sided numbness and weakness.   R brain TIA [place] from small vessel disease.  Code Stroke CT head No acute abnormality. Small vessel disease white matter, thalamus and basal ganglia. Atrophy. ASPECTS 10.     CTA head & neck no LVO. Mild atherosclerosis.   MRI  No acute abnormality. Small vessel disease. Numerous microhemorrhages. Sinus dz.   2D Echo  EF 65-70%. No source of embolus .  Mildly dilated LA  LDL 120  HgbA1c 6.5  Heparin 5000 units sq tid for VTE prophylaxis  No antithrombotic (not taking aspirin daily prior to admission), now on aspirin 81 and Plavix 75 for 3 weeks followed by  aspirin 81 mg alone  Therapy recommendations: OP OT  Disposition: Likely home  Followed by Dr. Tomi Likens as an OP     Hypertensive Urgency  BP as high as 241/111   Treated with cardene, now off  SBP goal < 200 from stroke perspective . Long term BP goal normotensive  Hyperlipidemia  Home meds:  lipitor 80, resumed in hospital  LDL 120, goal < 70  Continue statin at discharge  Diabetes type Controlled  HgbA1c 6.5, goal < 7.0  Other Stroke Risk Factors  ETOH use,  advised to drink no more than 1 drink(s) a day  Morbid Obesity, Body mass index is 50.04 kg/m., recommend weight loss, diet and exercise as appropriate   Hx stroke/TIA  03/2018 - Small R corona radiata and centrum semiovale infarcts  secondary to small vessel disease source in setting of uncontrolled HTN  2017 R basal ganglia infarct d/t small vessel disease.   Obstructive sleep apnea, had CPAP in the past but could not get approved through [facility]. Unable to keep machine on long enough to be considered for Sleep Smart Research Study.    Sickle cell  Other Active Problems  Hx PRES 2018  CKD stage III . Renal US.  Pending  Hyperkalemia  Hospital day # 2  Continue mobilize out of bed and ongoing management.  Repeat [place] 3 monitor screening for sleep apnea tonight discussed with patient and Dr. Nevada Crane.   Antony Contras, MD Medical Director [facility] Pager: [phone] 06/29/2019 10:26 AM  To contact Stroke Continuity provider, please refer to [email or web address] After hours, contact General Neurology

## 2019-06-29 NOTE — TOC CAGE-AID Note (Signed)
Transition of Care [facility]) - CAGE-AID Screening   Patient Details  Name: Belinda Ramirez MRN: [id number] Date of Birth: January 20, 1976  Transition of Care [facility]) CM/SW Contact:    Emeterio Reeve, [place] Phone Number: 06/29/2019, 2:44 PM   Clinical Narrative: CSW met with pt at bedside. CSW introduced self and explained her role at the hospital. CSW inquired about alcohol use and substance use. Pt declined alcohol and substance use. NO education or resources where provided.  CAGE-AID Screening:    Have You Ever Felt You Ought to Cut Down on Your Drinking or Drug Use?: No Have People Annoyed You By Critizing Your Drinking Or Drug Use?: No   Have You Ever Had a Drink or Used Drugs First Thing In The Morning to STeady Your Nerves or to Get Rid of a Hangover?: No    Substance Abuse Education Offered: No      Blima Ledger, [place] Social Worker [phone]

## 2019-06-29 NOTE — Discharge Instructions (Signed)
Hypertension, Adult Hypertension is another name for high blood pressure. High blood pressure forces your heart to work harder to pump blood. This can cause problems over time. There are two numbers in a blood pressure reading. There is a top number (systolic) over a bottom number (diastolic). It is best to have a blood pressure that is below 120/80. Healthy choices can help lower your blood pressure, or you may need medicine to help lower it. What are the causes? The cause of this condition is not known. Some conditions may be related to high blood pressure. What increases the risk?  Smoking.  Having type 2 diabetes mellitus, high cholesterol, or both.  Not getting enough exercise or physical activity.  Being overweight.  Having too much fat, sugar, calories, or salt (sodium) in your diet.  Drinking too much alcohol.  Having long-term (chronic) kidney disease.  Having a family history of high blood pressure.  Age. Risk increases with age.  Race. You may be at higher risk if you are African American.  Gender. Men are at higher risk than women before age 45. After age 65, women are at higher risk than men.  Having obstructive sleep apnea.  Stress. What are the signs or symptoms?  High blood pressure may not cause symptoms. Very high blood pressure (hypertensive crisis) may cause: ? Headache. ? Feelings of worry or nervousness (anxiety). ? Shortness of breath. ? Nosebleed. ? A feeling of being sick to your stomach (nausea). ? Throwing up (vomiting). ? Changes in how you see. ? Very bad chest pain. ? Seizures. How is this treated?  This condition is treated by making healthy lifestyle changes, such as: ? Eating healthy foods. ? Exercising more. ? Drinking less alcohol.  Your health care provider may prescribe medicine if lifestyle changes are not enough to get your blood pressure under control, and if: ? Your top number is above 130. ? Your bottom number is above  80.  Your personal target blood pressure may vary. Follow these instructions at home: Eating and drinking   If told, follow the DASH eating plan. To follow this plan: ? Fill one half of your plate at each meal with fruits and vegetables. ? Fill one fourth of your plate at each meal with whole grains. Whole grains include whole-wheat pasta, brown rice, and whole-grain bread. ? Eat or drink low-fat dairy products, such as skim milk or low-fat yogurt. ? Fill one fourth of your plate at each meal with low-fat (lean) proteins. Low-fat proteins include fish, chicken without skin, eggs, beans, and tofu. ? Avoid fatty meat, cured and processed meat, or chicken with skin. ? Avoid pre-made or processed food.  Eat less than 1,500 mg of salt each day.  Do not drink alcohol if: ? Your doctor tells you not to drink. ? You are pregnant, may be pregnant, or are planning to become pregnant.  If you drink alcohol: ? Limit how much you use to:  0-1 drink a day for women.  0-2 drinks a day for men. ? Be aware of how much alcohol is in your drink. In the [place] one drink equals one 12 oz bottle of beer (355 mL), one 5 oz glass of wine (148 mL), or one 1 oz glass of hard liquor (44 mL). Lifestyle   Work with your doctor to stay at a healthy weight or to lose weight. Ask your doctor what the best weight is for you.  Get at least 30 minutes of exercise most   days of the week. This may include walking, swimming, or biking.  Get at least 30 minutes of exercise that strengthens your muscles (resistance exercise) at least 3 days a week. This may include lifting weights or doing Pilates.  Do not use any products that contain nicotine or tobacco, such as cigarettes, e-cigarettes, and chewing tobacco. If you need help quitting, ask your doctor.  Check your blood pressure at home as told by your doctor.  Keep all follow-up visits as told by your doctor. This is important. Medicines  Take over-the-counter  and prescription medicines only as told by your doctor. Follow directions carefully.  Do not skip doses of blood pressure medicine. The medicine does not work as well if you skip doses. Skipping doses also puts you at risk for problems.  Ask your doctor about side effects or reactions to medicines that you should watch for. Contact a doctor if you:  Think you are having a reaction to the medicine you are taking.  Have headaches that keep coming back (recurring).  Feel dizzy.  Have swelling in your ankles.  Have trouble with your vision. Get help right away if you:  Get a very bad headache.  Start to feel mixed up (confused).  Feel weak or numb.  Feel faint.  Have very bad pain in your: ? Chest. ? Belly (abdomen).  Throw up more than once.  Have trouble breathing. Summary  Hypertension is another name for high blood pressure.  High blood pressure forces your heart to work harder to pump blood.  For most people, a normal blood pressure is less than 120/80.  Making healthy choices can help lower blood pressure. If your blood pressure does not get lower with healthy choices, you may need to take medicine. This information is not intended to replace advice given to you by your health care provider. Make sure you discuss any questions you have with your health care provider. Document Revised: 10/12/2017 Document Reviewed: 10/12/2017 Elsevier Patient Education  2020 [place] Your Hypertension Hypertension is commonly called high blood pressure. This is when the force of your blood pressing against the walls of your arteries is too strong. Arteries are blood vessels that carry blood from your heart throughout your body. Hypertension forces the heart to work harder to pump blood, and may cause the arteries to become narrow or stiff. Having untreated or uncontrolled hypertension can cause heart attack, stroke, kidney disease, and other problems. What are blood  pressure readings? A blood pressure reading consists of a higher number over a lower number. Ideally, your blood pressure should be below 120/80. The first ("top") number is called the systolic pressure. It is a measure of the pressure in your arteries as your heart beats. The second ("bottom") number is called the diastolic pressure. It is a measure of the pressure in your arteries as the heart relaxes. What does my blood pressure reading mean? Blood pressure is classified into four stages. Based on your blood pressure reading, your health care provider may use the following stages to determine what type of treatment you need, if any. Systolic pressure and diastolic pressure are measured in a unit called mm Hg. Normal  Systolic pressure: below 127.  Diastolic pressure: below 80. Elevated  Systolic pressure: 517-001.  Diastolic pressure: below 80. Hypertension stage 1  Systolic pressure: 749-449.  Diastolic pressure: 67-59. Hypertension stage 2  Systolic pressure: 163 or above.  Diastolic pressure: 90 or above. What health risks are associated  with hypertension? Managing your hypertension is an important responsibility. Uncontrolled hypertension can lead to:  A heart attack.  A stroke.  A weakened blood vessel (aneurysm).  Heart failure.  Kidney damage.  Eye damage.  Metabolic syndrome.  Memory and concentration problems. What changes can I make to manage my hypertension? Hypertension can be managed by making lifestyle changes and possibly by taking medicines. Your health care provider will help you make a plan to bring your blood pressure within a normal range. Eating and drinking   Eat a diet that is high in fiber and potassium, and low in salt (sodium), added sugar, and fat. An example eating plan is called the DASH (Dietary Approaches to Stop Hypertension) diet. To eat this way: ? Eat plenty of fresh fruits and vegetables. Try to fill half of your plate at each  meal with fruits and vegetables. ? Eat whole grains, such as whole wheat pasta, brown rice, or whole grain bread. Fill about one quarter of your plate with whole grains. ? Eat low-fat diary products. ? Avoid fatty cuts of meat, processed or cured meats, and poultry with skin. Fill about one quarter of your plate with lean proteins such as fish, chicken without skin, beans, eggs, and tofu. ? Avoid premade and processed foods. These tend to be higher in sodium, added sugar, and fat.  Reduce your daily sodium intake. Most people with hypertension should eat less than 1,500 mg of sodium a day.  Limit alcohol intake to no more than 1 drink a day for nonpregnant women and 2 drinks a day for men. One drink equals 12 oz of beer, 5 oz of wine, or 1 oz of hard liquor. Lifestyle  Work with your health care provider to maintain a healthy body weight, or to lose weight. Ask what an ideal weight is for you.  Get at least 30 minutes of exercise that causes your heart to beat faster (aerobic exercise) most days of the week. Activities may include walking, swimming, or biking.  Include exercise to strengthen your muscles (resistance exercise), such as weight lifting, as part of your weekly exercise routine. Try to do these types of exercises for 30 minutes at least 3 days a week.  Do not use any products that contain nicotine or tobacco, such as cigarettes and e-cigarettes. If you need help quitting, ask your health care provider.  Control any long-term (chronic) conditions you have, such as high cholesterol or diabetes. Monitoring  Monitor your blood pressure at home as told by your health care provider. Your personal target blood pressure may vary depending on your medical conditions, your age, and other factors.  Have your blood pressure checked regularly, as often as told by your health care provider. Working with your health care provider  Review all the medicines you take with your health care  provider because there may be side effects or interactions.  Talk with your health care provider about your diet, exercise habits, and other lifestyle factors that may be contributing to hypertension.  Visit your health care provider regularly. Your health care provider can help you create and adjust your plan for managing hypertension. Will I need medicine to control my blood pressure? Your health care provider may prescribe medicine if lifestyle changes are not enough to get your blood pressure under control, and if:  Your systolic blood pressure is 130 or higher.  Your diastolic blood pressure is 80 or higher. Take medicines only as told by your health care provider. Follow  the directions carefully. Blood pressure medicines must be taken as prescribed. The medicine does not work as well when you skip doses. Skipping doses also puts you at risk for problems. Contact a health care provider if:  You think you are having a reaction to medicines you have taken.  You have repeated (recurrent) headaches.  You feel dizzy.  You have swelling in your ankles.  You have trouble with your vision. Get help right away if:  You develop a severe headache or confusion.  You have unusual weakness or numbness, or you feel faint.  You have severe pain in your chest or abdomen.  You vomit repeatedly.  You have trouble breathing. Summary  Hypertension is when the force of blood pumping through your arteries is too strong. If this condition is not controlled, it may put you at risk for serious complications.  Your personal target blood pressure may vary depending on your medical conditions, your age, and other factors. For most people, a normal blood pressure is less than 120/80.  Hypertension is managed by lifestyle changes, medicines, or both. Lifestyle changes include weight loss, eating a healthy, low-sodium diet, exercising more, and limiting alcohol. This information is not intended to  replace advice given to you by your health care provider. Make sure you discuss any questions you have with your health care provider. Document Revised: 05/26/2018 Document Reviewed: 12/31/2015 Elsevier Patient Education  [place].   Preventing Hypertension Hypertension, commonly called high blood pressure, is when the force of blood pumping through the arteries is too strong. Arteries are blood vessels that carry blood from the heart throughout the body. Over time, hypertension can damage the arteries and decrease blood flow to important parts of the body, including the brain, heart, and kidneys. Often, hypertension does not cause symptoms until blood pressure is very high. For this reason, it is important to have your blood pressure checked on a regular basis. Hypertension can often be prevented with diet and lifestyle changes. If you already have hypertension, you can control it with diet and lifestyle changes, as well as medicine. What nutrition changes can be made? Maintain a healthy diet. This includes:  Eating less salt (sodium). Ask your health care provider how much sodium is safe for you to have. The general recommendation is to consume less than 1 tsp (2,300 mg) of sodium a day. ? Do not add salt to your food. ? Choose low-sodium options when grocery shopping and eating out.  Limiting fats in your diet. You can do this by eating low-fat or fat-free dairy products and by eating less red meat.  Eating more fruits, vegetables, and whole grains. Make a goal to eat: ? 1-2 cups of fresh fruits and vegetables each day. ? 3-4 servings of whole grains each day.  Avoiding foods and beverages that have added sugars.  Eating fish that contain healthy fats (omega-3 fatty acids), such as mackerel or salmon. If you need help putting together a healthy eating plan, try the DASH diet. This diet is high in fruits, vegetables, and whole grains. It is low in sodium, red meat, and added  sugars. DASH stands for Dietary Approaches to Stop Hypertension. What lifestyle changes can be made?   Lose weight if you are overweight. Losing just 3?5% of your body weight can help prevent or control hypertension. ? For example, if your present weight is 200 lb (91 kg), a loss of 3-5% of your weight means losing 6-10 lb (2.7-4.5 kg). ? Ask  your health care provider to help you with a diet and exercise plan to safely lose weight.  Get enough exercise. Do at least 150 minutes of moderate-intensity exercise each week. ? You could do this in short exercise sessions several times a day, or you could do longer exercise sessions a few times a week. For example, you could take a brisk 10-minute walk or bike ride, 3 times a day, for 5 days a week.  Find ways to reduce stress, such as exercising, meditating, listening to music, or taking a yoga class. If you need help reducing stress, ask your health care provider.  Do not smoke. This includes e-cigarettes. Chemicals in tobacco and nicotine products raise your blood pressure each time you smoke. If you need help quitting, ask your health care provider.  Avoid alcohol. If you drink alcohol, limit alcohol intake to no more than 1 drink a day for nonpregnant women and 2 drinks a day for men. One drink equals 12 oz of beer, 5 oz of wine, or 1 oz of hard liquor. Why are these changes important? Diet and lifestyle changes can help you prevent hypertension, and they may make you feel better overall and improve your quality of life. If you have hypertension, making these changes will help you control it and help prevent major complications, such as:  Hardening and narrowing of arteries that supply blood to: ? Your heart. This can cause a heart attack. ? Your brain. This can cause a stroke. ? Your kidneys. This can cause kidney failure.  Stress on your heart muscle, which can cause heart failure. What can I do to lower my risk?  Work with your health  care provider to make a hypertension prevention plan that works for you. Follow your plan and keep all follow-up visits as told by your health care provider.  Learn how to check your blood pressure at home. Make sure that you know your personal target blood pressure, as told by your health care provider. How is this treated? In addition to diet and lifestyle changes, your health care provider may recommend medicines to help lower your blood pressure. You may need to try a few different medicines to find what works best for you. You also may need to take more than one medicine. Take over-the-counter and prescription medicines only as told by your health care provider. Where to find support Your health care provider can help you prevent hypertension and help you keep your blood pressure at a healthy level. Your local hospital or your community may also provide support services and prevention programs. The American Heart Association offers an online support network at: CheapBootlegs.com.cy Where to find more information Learn more about hypertension from:  [place], [facility], and Blood Institute: ElectronicHangman.is  Centers for Disease Control and Prevention: https://ingram.com/  American Academy of Family Physicians: http://familydoctor.org/familydoctor/en/diseases-conditions/high-blood-pressure.printerview.all.html Learn more about the DASH diet from:  [place], Lung, and [place]: https://www.reyes.com/ Contact a health care provider if:  You think you are having a reaction to medicines you have taken.  You have recurrent headaches or feel dizzy.  You have swelling in your ankles.  You have trouble with your vision. Summary  Hypertension often does not cause any symptoms until blood pressure is very high. It is important to get your blood pressure checked regularly.  Diet and  lifestyle changes are the most important steps in preventing hypertension.  By keeping your blood pressure in a healthy range, you can prevent complications like heart attack,  heart failure, stroke, and kidney failure.  Work with your health care provider to make a hypertension prevention plan that works for you. This information is not intended to replace advice given to you by your health care provider. Make sure you discuss any questions you have with your health care provider. Document Revised: 05/26/2018 Document Reviewed: 10/13/2015 Elsevier Patient Education  [place] DASH stands for "Dietary Approaches to Stop Hypertension." The DASH eating plan is a healthy eating plan that has been shown to reduce high blood pressure (hypertension). It may also reduce your risk for type 2 diabetes, heart disease, and stroke. The DASH eating plan may also help with weight loss. What are tips for following this plan?  General guidelines  Avoid eating more than 2,300 mg (milligrams) of salt (sodium) a day. If you have hypertension, you may need to reduce your sodium intake to 1,500 mg a day.  Limit alcohol intake to no more than 1 drink a day for nonpregnant women and 2 drinks a day for men. One drink equals 12 oz of beer, 5 oz of wine, or 1 oz of hard liquor.  Work with your health care provider to maintain a healthy body weight or to lose weight. Ask what an ideal weight is for you.  Get at least 30 minutes of exercise that causes your heart to beat faster (aerobic exercise) most days of the week. Activities may include walking, swimming, or biking.  Work with your health care provider or diet and nutrition specialist (dietitian) to adjust your eating plan to your individual calorie needs. Reading food labels   Check food labels for the amount of sodium per serving. Choose foods with less than 5 percent of the Daily Value of sodium. Generally, foods with less than 300  mg of sodium per serving fit into this eating plan.  To find whole grains, look for the word "whole" as the first word in the ingredient list. Shopping  Buy products labeled as "low-sodium" or "no salt added."  Buy fresh foods. Avoid canned foods and premade or frozen meals. Cooking  Avoid adding salt when cooking. Use salt-free seasonings or herbs instead of table salt or sea salt. Check with your health care provider or pharmacist before using salt substitutes.  Do not fry foods. Cook foods using healthy methods such as baking, boiling, grilling, and broiling instead.  Cook with heart-healthy oils, such as olive, canola, soybean, or sunflower oil. Meal planning  Eat a balanced diet that includes: ? 5 or more servings of fruits and vegetables each day. At each meal, try to fill half of your plate with fruits and vegetables. ? Up to 6-8 servings of whole grains each day. ? Less than 6 oz of lean meat, poultry, or fish each day. A 3-oz serving of meat is about the same size as a deck of cards. One egg equals 1 oz. ? 2 servings of low-fat dairy each day. ? A serving of nuts, seeds, or beans 5 times each week. ? Heart-healthy fats. Healthy fats called Omega-3 fatty acids are found in foods such as flaxseeds and coldwater fish, like sardines, salmon, and mackerel.  Limit how much you eat of the following: ? Canned or prepackaged foods. ? Food that is high in trans fat, such as fried foods. ? Food that is high in saturated fat, such as fatty meat. ? Sweets, desserts, sugary drinks, and other foods with added sugar. ? Full-fat dairy products.  Do  not salt foods before eating.  Try to eat at least 2 vegetarian meals each week.  Eat more home-cooked food and less restaurant, buffet, and fast food.  When eating at a restaurant, ask that your food be prepared with less salt or no salt, if possible. What foods are recommended? The items listed may not be a complete list. Talk with your  dietitian about what dietary choices are best for you. Grains Whole-grain or whole-wheat bread. Whole-grain or whole-wheat pasta. Brown rice. [place]. Bulgur. Whole-grain and low-sodium cereals. Pita bread. Low-fat, low-sodium crackers. Whole-wheat flour tortillas. Vegetables Fresh or frozen vegetables (raw, steamed, roasted, or grilled). Low-sodium or reduced-sodium tomato and vegetable juice. Low-sodium or reduced-sodium tomato sauce and tomato paste. Low-sodium or reduced-sodium canned vegetables. Fruits All fresh, dried, or frozen fruit. Canned fruit in natural juice (without added sugar). Meat and other protein foods Skinless chicken or [place]. Ground chicken or [place]. Pork with fat trimmed off. Fish and seafood. Egg whites. Dried beans, peas, or lentils. Unsalted nuts, nut butters, and seeds. Unsalted canned beans. Lean cuts of beef with fat trimmed off. Low-sodium, lean deli meat. Dairy Low-fat (1%) or fat-free (skim) milk. Fat-free, low-fat, or reduced-fat cheeses. Nonfat, low-sodium ricotta or cottage cheese. Low-fat or nonfat yogurt. Low-fat, low-sodium cheese. Fats and oils Soft margarine without trans fats. Vegetable oil. Low-fat, reduced-fat, or light mayonnaise and salad dressings (reduced-sodium). Canola, safflower, olive, soybean, and sunflower oils. Avocado. Seasoning and other foods Herbs. Spices. Seasoning mixes without salt. Unsalted popcorn and pretzels. Fat-free sweets. What foods are not recommended? The items listed may not be a complete list. Talk with your dietitian about what dietary choices are best for you. Grains Baked goods made with fat, such as croissants, muffins, or some breads. Dry pasta or rice meal packs. Vegetables Creamed or fried vegetables. Vegetables in a cheese sauce. Regular canned vegetables (not low-sodium or reduced-sodium). Regular canned tomato sauce and paste (not low-sodium or reduced-sodium). Regular tomato and vegetable juice (not  low-sodium or reduced-sodium). Angie Fava. Olives. Fruits Canned fruit in a light or heavy syrup. Fried fruit. Fruit in cream or butter sauce. Meat and other protein foods Fatty cuts of meat. Ribs. Fried meat. Berniece Salines. Sausage. Bologna and other processed lunch meats. Salami. Fatback. Hotdogs. Bratwurst. Salted nuts and seeds. Canned beans with added salt. Canned or smoked fish. Whole eggs or egg yolks. Chicken or [place] with skin. Dairy Whole or 2% milk, cream, and half-and-half. Whole or full-fat cream cheese. Whole-fat or sweetened yogurt. Full-fat cheese. Nondairy creamers. Whipped toppings. Processed cheese and cheese spreads. Fats and oils Butter. Stick margarine. Lard. Shortening. Ghee. Bacon fat. Tropical oils, such as coconut, palm kernel, or palm oil. Seasoning and other foods Salted popcorn and pretzels. Onion salt, garlic salt, seasoned salt, table salt, and sea salt. Worcestershire sauce. Tartar sauce. Barbecue sauce. Teriyaki sauce. Soy sauce, including reduced-sodium. Steak sauce. Canned and packaged gravies. Fish sauce. Oyster sauce. Cocktail sauce. Horseradish that you find on the shelf. Ketchup. Mustard. Meat flavorings and tenderizers. Bouillon cubes. Hot sauce and Tabasco sauce. Premade or packaged marinades. Premade or packaged taco seasonings. Relishes. Regular salad dressings. Where to find more information:  National Heart, Lung, and [place]: https://wilson-eaton.com/  American Heart Association: www.heart.org Summary  The DASH eating plan is a healthy eating plan that has been shown to reduce high blood pressure (hypertension). It may also reduce your risk for type 2 diabetes, heart disease, and stroke.  With the DASH eating plan, you should limit salt (sodium) intake  to 2,300 mg a day. If you have hypertension, you may need to reduce your sodium intake to 1,500 mg a day.  When on the DASH eating plan, aim to eat more fresh fruits and vegetables, whole grains, lean proteins,  low-fat dairy, and heart-healthy fats.  Work with your health care provider or diet and nutrition specialist (dietitian) to adjust your eating plan to your individual calorie needs. This information is not intended to replace advice given to you by your health care provider. Make sure you discuss any questions you have with your health care provider. Document Revised: 01/14/2017 Document Reviewed: 01/26/2016 Elsevier Patient Education  2020 [employer].

## 2019-06-29 NOTE — Progress Notes (Signed)
PROGRESS NOTE  Belinda Ramirez YQI:[id number] DOB: [birth year] DOA: 06/27/2019 PCP: Gildardo Pounds, NP  HPI/Recap of past 59 hours: 44 year old female with past medical history of intracranial hemorrhage, has severe hypertension and states that she has been compliant with her home medications.  She presented with numbness of left face/tongue with left sided weakness.  Her blood pressure was significantly elevated.  Received labetalol and cleviprex.  Neurology saw her, CT head/CTA/MRI all showed no acute process.  She was started on nicardipine after a brief course of cleviprex.  Since admission she has been feeling fine.  Her numbness has resolved.  Renal artery [name] negative for renal stenosis.  06/29/19:  Denies non compliance to her home anti-hypertensive medications.  BP is significantly improved.  Ongoing assessment for sleep apnea which could contribute to her uncontrolled hypertension.  Assessment/Plan: Active Problems:   Hypertensive crisis  Acute hypertensive emergency, unknown etiology States she was compliant Restarted home meds On amlodipine 10 mg daily, clonidine 0.2 mg TID, Lisinopril 40 mg daily Significantly improved BP Renal artery doppler US no significant stenosis  R brain TIA CT head/CTA/MRI no acute processes Continue ASA, plavix for 3 weeks then ASA alone Continue high intensity statin, lipitor 80 mg daily 2D echo LVEF 65-70%, Grade 2DD, interatrial septum not well visualized.  No evidence of embolus.  AKI on CKD 3A Baseline cr 1.5 GFR 46 Cr 2.01 today with GFR 34 Avoid nephrotoxins and hypotension Monitor UO Repeat BMP am  HLD LDL 120, goal <70 Continue high intensity statin Lipitor at 80 mg daily  Suspected OSA Could not afford study in the past Sleep smart stroke study planned tonight  Managed by neurology  DM2 with transient hyperglycemia A1C 6.5 Hold off oral hypoglycemics Continue ISS  Chronic anxiety/depression Stable Continue  escitalopram  Iron def anemia Resume iron supp Hg stable Monitor H&H     Status is: Inpatient    Dispo: The patient is from: Home              Anticipated d/c is LO:VFIE              Anticipated d/c date is: 06/30/19              Patient currently ongoing plan for sleep study tonight         Objective: Vitals:   06/28/19 2256 06/29/19 0315 06/29/19 0743 06/29/19 1149  BP: (!) 154/80 (!) 113/51 114/65 (!) 123/49  Pulse: 65 62 61 66  Resp: 18 18 16 20   Temp:  98.6 F (37 C) 99.3 F (37.4 C) 99.2 F (37.3 C)  TempSrc:   Oral Oral  SpO2: 100% 99% 97% 99%  Weight:        Intake/Output Summary (Last 24 hours) at 06/29/2019 1505 Last data filed at 06/29/2019 1149 Gross per 24 hour  Intake 420 ml  Output 290 ml  Net 130 ml   Filed Weights   06/27/19 1400  Weight: (!) 136.4 kg    Exam:  . General: 44 y.o. year-old female well developed well nourished in no acute distress.  Alert and oriented x3. . Cardiovascular: Regular rate and rhythm with no rubs or gallops.  No thyromegaly or JVD noted.   Marland Kitchen Respiratory: Clear to auscultation with no wheezes or rales. Good inspiratory effort. . Abdomen: Soft nontender nondistended with normal bowel sounds x4 quadrants. . Musculoskeletal: No lower extremity edema. 2/4 pulses in all 4 extremities. Marland Kitchen Psychiatry: Mood is appropriate for condition and setting  Data Reviewed: CBC: Recent Labs  Lab 06/27/19 1434 06/27/19 1459 06/28/19 0409  WBC 9.4  --  10.0  NEUTROABS 6.3  --   --   HGB 13.2 14.3 13.2  HCT 41.9 42.0 40.4  MCV 78.3*  --  76.5*  PLT 318  --  814   Basic Metabolic Panel: Recent Labs  Lab 06/27/19 1434 06/27/19 1459 06/27/19 2359 06/28/19 0409 06/29/19 0218  NA 138 141 139 141 140  K 6.1* 3.9 3.6 3.6 4.6  CL 103 102 106 107 107  CO2 27  --  22 24 22   GLUCOSE 148* 140* 138* 112* 112*  BUN 12 12 12 11 19   CREATININE 1.47* 1.50* 1.64* 1.55* 2.01*  CALCIUM 8.8*  --  8.7* 8.6* 8.4*  MG  --   --    --  1.9  --   PHOS  --   --   --  3.9  --    GFR: Estimated Creatinine Clearance: 49.5 mL/min (A) (by C-G formula based on SCr of 2.01 mg/dL (H)). Liver Function Tests: Recent Labs  Lab 06/27/19 1434 06/29/19 0218  AST 43* 13*  ALT 20 10  ALKPHOS 84 66  BILITOT 0.6 0.9  PROT 7.8 6.7  ALBUMIN 3.3* 2.8*   No results for input(s): LIPASE, AMYLASE in the last 168 hours. No results for input(s): AMMONIA in the last 168 hours. Coagulation Profile: Recent Labs  Lab 06/27/19 1434  INR 1.0   Cardiac Enzymes: No results for input(s): CKTOTAL, CKMB, CKMBINDEX, TROPONINI in the last 168 hours. BNP (last 3 results) No results for input(s): PROBNP in the last 8760 hours. HbA1C: Recent Labs    06/27/19 2359  HGBA1C 6.5*   CBG: Recent Labs  Lab 06/28/19 1300 06/28/19 1702 06/28/19 2218 06/29/19 0613 06/29/19 1147  GLUCAP 186* 171* 189* 101* 180*   Lipid Profile: Recent Labs    06/28/19 0409  CHOL 209*  HDL 79  LDLCALC 120*  TRIG 51  CHOLHDL 2.6   Thyroid Function Tests: No results for input(s): TSH, T4TOTAL, FREET4, T3FREE, THYROIDAB in the last 72 hours. Anemia Panel: No results for input(s): VITAMINB12, FOLATE, FERRITIN, TIBC, IRON, RETICCTPCT in the last 72 hours. Urine analysis:    Component Value Date/Time   COLORURINE YELLOW 02/05/2018 0141   APPEARANCEUR CLOUDY (A) 02/05/2018 0141   APPEARANCEUR Cloudy (A) 09/23/2017 1201   LABSPEC 1.020 02/05/2018 0141   PHURINE 6.0 02/05/2018 0141   GLUCOSEU NEGATIVE 02/05/2018 0141   HGBUR TRACE (A) 02/05/2018 0141   BILIRUBINUR negative 11/13/2018 1220   BILIRUBINUR Negative 09/23/2017 1201   KETONESUR negative 11/13/2018 1220   KETONESUR NEGATIVE 02/05/2018 0141   PROTEINUR 100 (A) 02/05/2018 0141   UROBILINOGEN 0.2 11/13/2018 1220   UROBILINOGEN 0.2 05/01/2013 0834   NITRITE Negative 11/13/2018 1220   NITRITE NEGATIVE 02/05/2018 0141   LEUKOCYTESUR Trace (A) 11/13/2018 1220   LEUKOCYTESUR 2+ (A) 09/23/2017  1201   Sepsis Labs: @LABRCNTIP (procalcitonin:4,lacticidven:4)  ) Recent Results (from the past 240 hour(s))  SARS Coronavirus 2 by RT PCR (hospital order, performed in [place] hospital lab) Nasopharyngeal Nasopharyngeal Swab     Status: None   Collection Time: 06/27/19  3:28 PM   Specimen: Nasopharyngeal Swab  Result Value Ref Range Status   SARS Coronavirus 2 NEGATIVE NEGATIVE Final    Comment: (NOTE) SARS-CoV-2 target nucleic acids are NOT DETECTED. The SARS-CoV-2 RNA is generally detectable in upper and lower respiratory specimens during the acute phase of infection. The lowest concentration  of SARS-CoV-2 viral copies this assay can detect is 250 copies / mL. A negative result does not preclude SARS-CoV-2 infection and should not be used as the sole basis for treatment or other patient management decisions.  A negative result may occur with improper specimen collection / handling, submission of specimen other than nasopharyngeal swab, presence of viral mutation(s) within the areas targeted by this assay, and inadequate number of viral copies (<250 copies / mL). A negative result must be combined with clinical observations, patient history, and epidemiological information. Fact Sheet for Patients:   StrictlyIdeas.no Fact Sheet for Healthcare Providers: BankingDealers.co.za This test is not yet approved or cleared  by the [place] FDA and has been authorized for detection and/or diagnosis of SARS-CoV-2 by FDA under an Emergency Use Authorization (EUA).  This EUA will remain in effect (meaning this test can be used) for the duration of the COVID-19 declaration under Section 564(b)(1) of the Act, 21 U.S.C. section 360bbb-3(b)(1), unless the authorization is terminated or revoked sooner. Performed at [facility], [place] [address], [place], [place] [address]   MRSA PCR Screening     Status: None   Collection Time:  06/27/19 11:06 PM   Specimen: Nasopharyngeal  Result Value Ref Range Status   MRSA by PCR NEGATIVE NEGATIVE Final    Comment:        The GeneXpert MRSA Assay (FDA approved for NASAL specimens only), is one component of a comprehensive MRSA colonization surveillance program. It is not intended to diagnose MRSA infection nor to guide or monitor treatment for MRSA infections. Performed at [facility], [place] [address], [place], [place] [address]       Studies: VAS US RENAL ARTERY DUPLEX  Result Date: 06/29/2019 ABDOMINAL VISCERAL High Risk Factors: Hypertension. Limitations: Obesity and air/bowel gas. Performing Technologist: June Leap RDMS, RVT  Examination Guidelines: A complete evaluation includes B-mode imaging, spectral Doppler, color Doppler, and power Doppler as needed of all accessible portions of each vessel. Bilateral testing is considered an integral part of a complete examination. Limited examinations for reoccurring indications may be performed as noted.  Duplex Findings: +--------------------+--------+--------+------+--------+ Mesenteric          PSV cm/sEDV cm/sPlaqueComments +--------------------+--------+--------+------+--------+ Aorta at SMA           77      12                  +--------------------+--------+--------+------+--------+ Celiac Artery Origin  292                          +--------------------+--------+--------+------+--------+ SMA Proximal          290                          +--------------------+--------+--------+------+--------+    +------------------+--------+--------+--------------+ Right Renal ArteryPSV cm/sEDV cm/s   Comment     +------------------+--------+--------+--------------+ Origin                            not visualized +------------------+--------+--------+--------------+ Proximal                          not visualized +------------------+--------+--------+--------------+ Mid                  90       9                   +------------------+--------+--------+--------------+  Distal              101      18                  +------------------+--------+--------+--------------+ +-----------------+--------+--------+--------------+ Left Renal ArteryPSV cm/sEDV cm/s   Comment     +-----------------+--------+--------+--------------+ Origin                           not visualized +-----------------+--------+--------+--------------+ Proximal                         not visualized +-----------------+--------+--------+--------------+ Mid                 67      21                  +-----------------+--------+--------+--------------+ Distal              97      28                  +-----------------+--------+--------+--------------+  Technologist observations: Unable to obtain intrarenal waveforms for resistive index. +------------+--------+--------+--+-----------+--------+--------+----+ Right KidneyPSV cm/sEDV cm/sRILeft KidneyPSV cm/sEDV cm/sRI   +------------+--------+--------+--+-----------+--------+--------+----+ Upper Pole                    Upper Pole                      +------------+--------+--------+--+-----------+--------+--------+----+ Mid                           Mid        47      19      0.60 +------------+--------+--------+--+-----------+--------+--------+----+ Lower Pole                    Lower Pole 25      8       0.70 +------------+--------+--------+--+-----------+--------+--------+----+ Hilar                         Hilar                           +------------+--------+--------+--+-----------+--------+--------+----+ +------------------+----+------------------+-----+ Right Kidney          Left Kidney             +------------------+----+------------------+-----+ RAR                   RAR                     +------------------+----+------------------+-----+ RAR (manual)          RAR (manual)             +------------------+----+------------------+-----+ Cortex                Cortex                  +------------------+----+------------------+-----+ Cortex thickness      Corex thickness         +------------------+----+------------------+-----+ Kidney length (cm)8.32Kidney length (cm)10.89 +------------------+----+------------------+-----+   Summary: Renal:  Right: Limited exam due to body habitus. No evidence of renal artery        stenosis in visualized segments. Left:  Limited exam due to body habitus. No evidence of renal artery  stenosis in visualized segments. 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  *See table(s) above for measurements and observations.     Preliminary     Scheduled Meds: . amLODipine  10 mg Oral Daily  . aspirin EC  81 mg Oral Daily  . atorvastatin  80 mg Oral Daily  . Chlorhexidine Gluconate Cloth  6 each Topical Daily  . cloNIDine  0.2 mg Oral TID  . clopidogrel  75 mg Oral Daily  . escitalopram  20 mg Oral Daily  . heparin  5,000 Units Subcutaneous Q8H  . insulin aspart  0-20 Units Subcutaneous TID WC  . insulin aspart  0-5 Units Subcutaneous QHS  . lisinopril  40 mg Oral Daily  . sodium chloride flush  3 mL Intravenous Once    Continuous Infusions:   LOS: 2 days     Kayleen Memos, MD [facility] Pager [phone]  If 7PM-7AM, please contact night-coverage [email or web address] Password [email or web address] 06/29/2019, 3:05 PM

## 2019-06-30 LAB — BASIC METABOLIC PANEL
Anion gap: 9 (ref 5–15)
BUN: 26 mg/dL — ABNORMAL HIGH (ref 6–20)
CO2: 23 mmol/L (ref 22–32)
Calcium: 8.6 mg/dL — ABNORMAL LOW (ref 8.9–10.3)
Chloride: 106 mmol/L (ref 98–111)
Creatinine, Ser: 1.97 mg/dL — ABNORMAL HIGH (ref 0.44–1.00)
GFR calc Af Amer: 35 mL/min — ABNORMAL LOW (ref >60)
GFR calc non Af Amer: 30 mL/min — ABNORMAL LOW (ref >60)
Glucose, Bld: 93 mg/dL (ref 70–99)
Potassium: 4.3 mmol/L (ref 3.5–5.1)
Sodium: 138 mmol/L (ref 135–145)

## 2019-06-30 LAB — GLUCOSE, CAPILLARY
Glucose-Capillary: 84 mg/dL (ref 70–99)
Glucose-Capillary: 94 mg/dL (ref 70–99)

## 2019-06-30 MED ORDER — ACCU-CHEK GUIDE CONTROL VI LIQD
1.0000 | Freq: Once | 0 refills | Status: AC | PRN
Start: 2019-06-30 — End: ?

## 2019-06-30 MED ORDER — LISINOPRIL 40 MG PO TABS: 40.0000 mg | ORAL_TABLET | Freq: Every day | ORAL | 0 refills | Status: DC

## 2019-06-30 MED ORDER — ACCU-CHEK GUIDE VI STRP: ORAL_STRIP | 1 refills | Status: AC

## 2019-06-30 MED ORDER — CLONIDINE HCL 0.2 MG PO TABS: 0.2000 mg | ORAL_TABLET | Freq: Three times a day (TID) | ORAL | 0 refills | Status: AC

## 2019-06-30 MED ORDER — AMLODIPINE BESYLATE 10 MG PO TABS: 10.0000 mg | ORAL_TABLET | Freq: Every day | ORAL | 0 refills | Status: DC

## 2019-06-30 MED ORDER — ACCU-CHEK FASTCLIX LANCETS MISC
1 refills | Status: AC
Start: 2019-06-30 — End: ?

## 2019-06-30 MED ORDER — ASPIRIN 81 MG PO TBEC: 81.0000 mg | DELAYED_RELEASE_TABLET | Freq: Every day | ORAL | 0 refills | Status: DC

## 2019-06-30 MED ORDER — ATORVASTATIN CALCIUM 80 MG PO TABS: 80.0000 mg | ORAL_TABLET | Freq: Every day | ORAL | 0 refills | Status: DC

## 2019-06-30 MED ORDER — CLOPIDOGREL BISULFATE 75 MG PO TABS: 75.0000 mg | ORAL_TABLET | Freq: Every day | ORAL | 0 refills | Status: DC

## 2019-06-30 NOTE — Discharge Summary (Signed)
Discharge Summary  Belinda Ramirez OJJ:[id number] DOB: 1975/11/20  PCP: Gildardo Pounds, NP  Admit date: 06/27/2019 Discharge date: 06/30/2019  Time spent: 35 minutes   Recommendations for Outpatient Follow-up:  1. Follow-up with your PCP 2. Follow-up with neurology 3. Take your medications as prescribed 4. Continue outpatient physical therapy  Discharge Diagnoses:  Active Hospital Problems   Diagnosis Date Noted  . Hypertensive crisis 06/27/2019    Resolved Hospital Problems  No resolved problems to display.    Discharge Condition: Stable  Diet recommendation: Heart healthy, carb modified diet  Vitals:   06/30/19 0439 06/30/19 0720  BP: (!) 147/80 131/70  Pulse: (!) 56 (!) 58  Resp: 18 18  Temp: 97.8 F (36.6 C) 97.9 F (36.6 C)  SpO2: 100% 98%    History of present illness:  44 year old female with past medical history of intracranial hemorrhage, has severe hypertension and states that she has been compliant with her home medications. She presented with numbness of left face/tongue with left sided weakness. Her blood pressure was significantly elevated. Received labetalol and cleviprex. Neurology saw her, CT head/CTA/MRI all showed no acute process. She was started on nicardipine after a brief course of cleviprex. Since admission she has been feeling fine. Her numbness has resolved. Renal artery ultrasound negative for renal stenosis.  She had a sleep smart stroke study done overnight due to suspected OSA, she could not afford study in the past.  06/30/19: No new complaints this morning.  Vital signs and labs are stable.  Outpatient rehab follow-up has been arranged for her.    Hospital Course:  Active Problems:   Hypertensive crisis  Acute hypertensive emergency Patient reports compliance with her medication. Restarted home meds and BP now at goal. On amlodipine 10 mg daily, clonidine 0.2 mg TID, and lisinopril 40 mg daily Renal artery doppler  ultrasound negative for renal artery stenosis.   R brain TIA CT head/CTA/MRI no acute processes Continue ASA, plavix for 3 weeks then ASA alone Continue high intensity statin, lipitor 80 mg daily 2D echo LVEF 65-70%, Grade 2DD, interatrial septum not well visualized.  No evidence of embolus.  AKI on CKD 3A Baseline cr 1.5 GFR 46 Creatinine is trending down 1.97 from Cr 2.01 Continue to avoid nephrotoxins Follow-up with your PCP  HLD LDL 120, goal <70 Continue high intensity statin Lipitor at 80 mg daily  Suspected OSA Could not afford study in the past Sleep smart stroke study completed overnight.   Follow-up with neurology.  Diet-controlled type 2 diabetes with transient hyperglycemia A1C 6.5 CBGs have been stable Diet controlled Follow-up with your PCP  Chronic anxiety/depression Stable Continue escitalopram Follow-up with your PCP  Ambulatory dysfunction Outpatient rehab follow-up has been arranged Continue PT OT with assistance and fall precautions   Consultations:  Neurology  PCCM  Discharge Exam: BP 131/70 (BP Location: Left Arm)   Pulse (!) 58   Temp 97.9 F (36.6 C) (Oral)   Resp 18   Wt (!) 136.4 kg   SpO2 98%   BMI 50.04 kg/m  . General: 44 y.o. year-old female well developed well nourished in no acute distress.  Alert and oriented x3. . Cardiovascular: Regular rate and rhythm with no rubs or gallops.   Marland Kitchen Respiratory: Clear to auscultation with no wheezes or rales.  . Abdomen: Soft nontender nondistended with normal bowel sounds x4 quadrants. . Musculoskeletal: No lower extremity edema bilaterally. Marland Kitchen Psychiatry: Mood is appropriate for condition and setting  Discharge Instructions You were cared  for by a hospitalist during your hospital stay. If you have any questions about your discharge medications or the care you received while you were in the hospital after you are discharged, you can call the unit and asked to speak with the  hospitalist on call if the hospitalist that took care of you is not available. Once you are discharged, your primary care physician will handle any further medical issues. Please note that NO REFILLS for any discharge medications will be authorized once you are discharged, as it is imperative that you return to your primary care physician (or establish a relationship with a primary care physician if you do not have one) for your aftercare needs so that they can reassess your need for medications and monitor your lab values.  Discharge Instructions    Ambulatory referral to Neurology   Complete by: As directed    Follow up with Dr. Leonie Man at [facility] in 4-6 weeks. Too complicated for RN to follow. Thanks.   Ambulatory referral to Occupational Therapy   Complete by: As directed      Allergies as of 06/30/2019      Reactions   Oxycodone Hives   Percocet [oxycodone-acetaminophen] Hives   Amoxicillin Hives   Has patient had a PCN reaction causing immediate rash, facial/tongue/throat swelling, SOB or lightheadedness with hypotension: No Has patient had a PCN reaction causing severe rash involving mucus membranes or skin necrosis: No Has patient had a PCN reaction that required hospitalization: No Has patient had a PCN reaction occurring within the last 10 years: Yes If all of the above answers are "NO", then may proceed with Cephalosporin use.   Aspirin Other (See Comments)   Hx of hemorrhagic stroke (pt does take low dose aspirin )   Latex Itching   Tape Rash      Medication List    STOP taking these medications   ferrous sulfate 325 (65 FE) MG EC tablet   hydrALAZINE 100 MG tablet Commonly known as: APRESOLINE   ibuprofen 200 MG tablet Commonly known as: ADVIL   spironolactone 25 MG tablet Commonly known as: ALDACTONE   traZODone 50 MG tablet Commonly known as: DESYREL     TAKE these medications   Accu-Chek FastClix Lancets Misc Use as instructed. Inject into the skin twice daily    Accu-Chek Guide Control Liqd 1 each by In Vitro route once as needed for up to 1 dose.   acetaminophen 500 MG tablet Commonly known as: Tylenol Take 2 tablets (1,000 mg total) by mouth every 6 (six) hours as needed. What changed: reasons to take this   amLODipine 10 MG tablet Commonly known as: NORVASC Take 1 tablet (10 mg total) by mouth daily.   aspirin 81 MG EC tablet Take 1 tablet (81 mg total) by mouth daily.   atorvastatin 80 MG tablet Commonly known as: LIPITOR Take 1 tablet (80 mg total) by mouth daily at 6 PM.   Blood Pressure Monitor Devi Please provide patient with insurance approved blood pressure monitor   cloNIDine 0.2 MG tablet Commonly known as: CATAPRES Take 1 tablet (0.2 mg total) by mouth 3 (three) times daily. What changed: when to take this   clopidogrel 75 MG tablet Commonly known as: PLAVIX Take 1 tablet (75 mg total) by mouth daily for 21 days.   escitalopram 20 MG tablet Commonly known as: LEXAPRO Take 20 mg by mouth daily.   fluticasone 50 MCG/ACT nasal spray Commonly known as: FLONASE Place 2 sprays into both  nostrils daily. What changed:   when to take this  reasons to take this   glucose blood test strip Commonly known as: Accu-Chek Guide Use as instructed. Check blood glucose by fingerstick twice per day.   hydrOXYzine 25 MG tablet Commonly known as: ATARAX/VISTARIL Take 25 mg by mouth in the morning.   lisinopril 40 MG tablet Commonly known as: ZESTRIL Take 1 tablet (40 mg total) by mouth daily.   Misc. Devices Misc Please provide patient with insurance approved thigh high compression stockings for swelling.   Misc. Devices Misc Please provide patient with insurance approved adult diapers size 3XL for urinary incontinence. ICD-10 N39.46   Ventolin HFA 108 (90 Base) MCG/ACT inhaler Generic drug: albuterol INHALE 2 PUFFS INTO THE LUNGS EVERY 6 (SIX) HOURS AS NEEDED FOR WHEEZING OR SHORTNESS OF BREATH. What changed: See  the new instructions.   Vitamin D (Ergocalciferol) 1.25 MG (50000 UNIT) Caps capsule Commonly known as: DRISDOL Take 50,000 Units by mouth every 7 (seven) days.            Durable Medical Equipment  (From admission, onward)         Start     Ordered   06/29/19 1605  For home use only DME 3 n 1  Once    Comments: Bariatric size   06/29/19 1605         Allergies  Allergen Reactions  . Oxycodone Hives  . Percocet [Oxycodone-Acetaminophen] Hives  . Amoxicillin Hives    Has patient had a PCN reaction causing immediate rash, facial/tongue/throat swelling, SOB or lightheadedness with hypotension: No Has patient had a PCN reaction causing severe rash involving mucus membranes or skin necrosis: No Has patient had a PCN reaction that required hospitalization: No Has patient had a PCN reaction occurring within the last 10 years: Yes If all of the above answers are "NO", then may proceed with Cephalosporin use.   . Aspirin Other (See Comments)    Hx of hemorrhagic stroke (pt does take low dose aspirin )  . Latex Itching  . Tape Rash   Follow-up Information    [facility].   Specialty: Rehabilitation Why: The outpatient rehab will call you for the first appointment. Contact information: [address] [place] [id number] [place]       Gildardo Pounds, NP. Call in 1 day(s).   Specialty: Nurse Practitioner Why: Please call for a post hospital follow up appointment. Contact information: [place] [address] [phone]        Garvin Fila, MD. Call in 1 day(s).   Specialties: Neurology, Radiology Why: Please call for a post hospital follow up appointment Contact information: [address] [place] [address] [phone]            The results of significant diagnostics from this hospitalization (including imaging,  microbiology, ancillary and laboratory) are listed below for reference.    Significant Diagnostic Studies: CT Code Stroke CTA Head W/WO contrast  Result Date: 06/27/2019 CLINICAL DATA:  Focal neurological deficit.  Slurred speech. EXAM: CT ANGIOGRAPHY HEAD AND NECK TECHNIQUE: Multidetector CT imaging of the head and neck was performed using the standard protocol during bolus administration of intravenous contrast. Multiplanar CT image reconstructions and MIPs were obtained to evaluate the vascular anatomy. Carotid stenosis measurements (when applicable) are obtained utilizing NASCET criteria, using the distal internal carotid diameter as the denominator. CONTRAST:  93mL OMNIPAQUE IOHEXOL 350 MG/ML  SOLN COMPARISON:  Head CT earlier same day.  MRI 01/15/2019. FINDINGS: CTA NECK FINDINGS Aortic arch: Mild aortic ectasia and atherosclerosis. Branching pattern is normal. Right carotid system: Common carotid artery widely patent to the bifurcation. No atherosclerotic disease at the carotid bifurcation. Cervical ICA is mildly tortuous but normal otherwise. Left carotid system: Common carotid artery widely patent to the bifurcation. Minimal atherosclerotic plaque at the carotid bifurcation but no stenosis. Cervical ICA is tortuous but otherwise normal. Vertebral arteries: Both vertebral artery origins are widely patent. Both vertebral arteries appear normal through the cervical region to the foramen magnum. Skeleton: Ordinary mid cervical spondylosis. Other neck: No mass or lymphadenopathy. Upper chest: Mild atelectasis or scar in the left upper lobe. Review of the MIP images confirms the above findings CTA HEAD FINDINGS Anterior circulation: Both internal carotid arteries are widely patent through the skull base and siphon regions. The anterior and middle cerebral vessels are patent without proximal stenosis, aneurysm or vascular malformation. No large or medium vessel occlusion is identified. Distal vessel  atherosclerotic irregularity. Posterior circulation: Both vertebral arteries widely patent to the basilar. No basilar stenosis. Posterior circulation branch vessels are patent. Distal vessel atherosclerotic irregularity. Venous sinuses: Patent and normal. Anatomic variants: None significant. Review of the MIP images confirms the above findings IMPRESSION: No acute large or medium vessel occlusion. Mild atherosclerotic plaque. No vessel stenosis noted in the neck. No intracranial large or medium vessel stenosis. Distal vessel atherosclerotic irregularity. These results were communicated to Dr. Leonel Ramsay at 3:09 pmon 5/12/2021by text page via the [facility] messaging system. Electronically Signed   By: Nelson Chimes M.D.   On: 06/27/2019 15:11   CT Code Stroke CTA Neck W/WO contrast  Result Date: 06/27/2019 CLINICAL DATA:  Focal neurological deficit.  Slurred speech. EXAM: CT ANGIOGRAPHY HEAD AND NECK TECHNIQUE: Multidetector CT imaging of the head and neck was performed using the standard protocol during bolus administration of intravenous contrast. Multiplanar CT image reconstructions and MIPs were obtained to evaluate the vascular anatomy. Carotid stenosis measurements (when applicable) are obtained utilizing NASCET criteria, using the distal internal carotid diameter as the denominator. CONTRAST:  73mL OMNIPAQUE IOHEXOL 350 MG/ML SOLN COMPARISON:  Head CT earlier same day.  MRI 01/15/2019. FINDINGS: CTA NECK FINDINGS Aortic arch: Mild aortic ectasia and atherosclerosis. Branching pattern is normal. Right carotid system: Common carotid artery widely patent to the bifurcation. No atherosclerotic disease at the carotid bifurcation. Cervical ICA is mildly tortuous but normal otherwise. Left carotid system: Common carotid artery widely patent to the bifurcation. Minimal atherosclerotic plaque at the carotid bifurcation but no stenosis. Cervical ICA is tortuous but otherwise normal. Vertebral arteries: Both vertebral  artery origins are widely patent. Both vertebral arteries appear normal through the cervical region to the foramen magnum. Skeleton: Ordinary mid cervical spondylosis. Other neck: No mass or lymphadenopathy. Upper chest: Mild atelectasis or scar in the left upper lobe. Review of the MIP images confirms the above findings CTA HEAD FINDINGS Anterior circulation: Both internal carotid arteries are widely patent through the skull base and siphon regions. The anterior and middle cerebral vessels are patent without proximal stenosis, aneurysm or vascular malformation. No large or medium vessel occlusion is identified. Distal vessel atherosclerotic irregularity. Posterior circulation: Both vertebral arteries widely patent to the basilar. No basilar stenosis. Posterior circulation branch vessels are patent. Distal vessel atherosclerotic irregularity. Venous sinuses: Patent and normal. Anatomic variants: None significant. Review of the MIP images confirms the above findings IMPRESSION: No acute large or medium vessel occlusion. Mild  atherosclerotic plaque. No vessel stenosis noted in the neck. No intracranial large or medium vessel stenosis. Distal vessel atherosclerotic irregularity. These results were communicated to Dr. Leonel Ramsay at 3:09 pmon 5/12/2021by text page via the [facility] messaging system. Electronically Signed   By: Nelson Chimes M.D.   On: 06/27/2019 15:11   MR BRAIN WO CONTRAST  Result Date: 06/27/2019 CLINICAL DATA:  Neuro deficit, acute, stroke suspected. Additional history provided: Left sided weakness. EXAM: MRI HEAD WITHOUT CONTRAST TECHNIQUE: Multiplanar, multiecho pulse sequences of the brain and surrounding structures were obtained without intravenous contrast. COMPARISON:  CT angiogram head/neck and non-contrast head CT performed earlier the same day, brain MRI 01/15/2019 FINDINGS: Brain: Redemonstrated advanced multifocal T2/FLAIR hyperintensity within the cerebral white matter which is  nonspecific, but consistent with chronic small vessel ischemic disease. To a lesser degree, chronic small vessel ischemic changes are also present within the brainstem. Redemonstrated chronic lacunar infarcts within the cerebral white matter, right basal ganglia, bilateral thalami and left pons. Associated chronic hemorrhage at these sites. Also similar to prior MRI 01/15/2019 there are numerous chronic microhemorrhages widely scattered throughout the brain. Stable, mild generalized parenchymal atrophy. There is no acute infarct. No evidence of intracranial mass. No extra-axial fluid collection. No midline shift. Vascular: Expected proximal arterial flow voids. Skull and upper cervical spine: No focal marrow lesion. Sinuses/Orbits: Visualized orbits show no acute finding. Mild paranasal sinus mucosal thickening, most notably ethmoid. No significant mastoid effusion. IMPRESSION: 1. No evidence of acute intracranial abnormality, including acute infarction. 2. Redemonstrated advanced chronic small vessel ischemic disease with chronic lacunar infarcts in the cerebral white matter, right basal ganglia, thalami and pons. 3. Stable, mild generalized parenchymal atrophy. 4. As before, there are numerous supratentorial and infratentorial chronic microhemorrhages. 5. Mild paranasal sinus mucosal thickening. Electronically Signed   By: Kellie Simmering DO   On: 06/27/2019 18:54   ECHOCARDIOGRAM COMPLETE  Result Date: 06/28/2019    ECHOCARDIOGRAM REPORT   Patient Name:   DICKIE LABARRE Date of Exam: 06/28/2019 Medical Rec #:  [id number]         Height:       65.0 in Accession #:    [id number]        Weight:       300.7 lb Date of Birth:  1975-06-13         BSA:          2.352 m Patient Age:    58 years          BP:           140/57 mmHg Patient Gender: F                 HR:           69 bpm. Exam Location:  Inpatient Procedure: 2D Echo Indications:    Resistant Hypertension  History:        Patient has prior history of  Echocardiogram examinations, most                 recent 03/29/2018. Risk Factors:Hypertension.  Sonographer:    Mikki Santee RDCS (AE) Referring Phys: [place]  1. Left ventricular ejection fraction, by estimation, is 65 to 70%. The left ventricle has normal function. The left ventricle has no regional wall motion abnormalities. There is moderate concentric left ventricular hypertrophy. Left ventricular diastolic parameters are consistent with Grade II diastolic dysfunction (pseudonormalization). Elevated left ventricular end-diastolic pressure.  2. Right ventricular systolic function is normal.  The right ventricular size is normal.  3. Left atrial size was mildly dilated.  4. The mitral valve is grossly normal. No evidence of mitral valve regurgitation. No evidence of mitral stenosis.  5. The aortic valve was not well visualized. Aortic valve regurgitation is not visualized. No aortic stenosis is present.  6. The inferior vena cava is normal in size with greater than 50% respiratory variability, suggesting right atrial pressure of 3 mmHg. FINDINGS  Left Ventricle: Left ventricular ejection fraction, by estimation, is 65 to 70%. The left ventricle has normal function. The left ventricle has no regional wall motion abnormalities. The left ventricular internal cavity size was normal in size. There is  moderate concentric left ventricular hypertrophy. Left ventricular diastolic parameters are consistent with Grade II diastolic dysfunction (pseudonormalization). Elevated left ventricular end-diastolic pressure. Right Ventricle: The right ventricular size is normal. No increase in right ventricular wall thickness. Right ventricular systolic function is normal. Left Atrium: Left atrial size was mildly dilated. Right Atrium: Right atrial size was not well visualized. Pericardium: There is no evidence of pericardial effusion. Mitral Valve: The mitral valve is grossly normal. No evidence of mitral  valve regurgitation. No evidence of mitral valve stenosis. Tricuspid Valve: The tricuspid valve is grossly normal. Tricuspid valve regurgitation is not demonstrated. No evidence of tricuspid stenosis. Aortic Valve: The aortic valve was not well visualized. Aortic valve regurgitation is not visualized. No aortic stenosis is present. Pulmonic Valve: The pulmonic valve was not well visualized. Pulmonic valve regurgitation is not visualized. No evidence of pulmonic stenosis. Aorta: The aortic root, ascending aorta and aortic arch are all structurally normal, with no evidence of dilitation or obstruction. Venous: The inferior vena cava is normal in size with greater than 50% respiratory variability, suggesting right atrial pressure of 3 mmHg. IAS/Shunts: The interatrial septum was not well visualized.  LEFT VENTRICLE PLAX 2D LVIDd:         4.70 cm  Diastology LVIDs:         2.60 cm  LV e' lateral:   5.27 cm/s LV PW:         1.70 cm  LV E/e' lateral: 18.9 LV IVS:        1.70 cm  LV e' medial:    4.22 cm/s LVOT diam:     2.20 cm  LV E/e' medial:  23.6 LV SV:         72 LV SV Index:   31 LVOT Area:     3.80 cm  LEFT ATRIUM             Index LA diam:        4.00 cm 1.70 cm/m LA Vol (A2C):   70.0 ml 29.76 ml/m LA Vol (A4C):   69.9 ml 29.71 ml/m LA Biplane Vol: 71.1 ml 30.22 ml/m  AORTIC VALVE LVOT Vmax:   95.00 cm/s LVOT Vmean:  61.100 cm/s LVOT VTI:    0.189 m  AORTA Ao Root diam: 3.00 cm MITRAL VALVE MV Area (PHT): 2.48 cm    SHUNTS MV Decel Time: 306 msec    Systemic VTI:  0.19 m MV E velocity: 99.40 cm/s  Systemic Diam: 2.20 cm MV A velocity: 93.10 cm/s MV E/A ratio:  1.07 Buford Dresser MD Electronically signed by Buford Dresser MD Signature Date/Time: 06/28/2019/6:15:05 PM    Final    CT HEAD CODE STROKE WO CONTRAST  Result Date: 06/27/2019 CLINICAL DATA:  Code stroke.  Acute neuro deficit.  Slurred speech EXAM: CT HEAD  WITHOUT CONTRAST TECHNIQUE: Contiguous axial images were obtained from the  base of the skull through the vertex without intravenous contrast. COMPARISON:  CT head 01/15/2019 FINDINGS: Brain: Generalized atrophy. Chronic microvascular ischemic change in the white matter. Chronic infarcts in the thalamus bilaterally and in the right caudate, unchanged. Negative for acute infarct, hemorrhage, mass Vascular: Negative for hyperdense vessel Skull: Negative Sinuses/Orbits: Negative Other: None ASPECTS ([place] Stroke Program Early CT Score) - Ganglionic level infarction (caudate, lentiform nuclei, internal capsule, insula, M1-M3 cortex): 7 - Supraganglionic infarction (M4-M6 cortex): 3 Total score (0-10 with 10 being normal): 10 IMPRESSION: 1. No acute abnormality 2. ASPECTS is 10 3. Atrophy and chronic ischemic change in the white matter, thalamus, and basal ganglia. 4. Preliminary report texted to Dr. Leonel Ramsay via [facility] Electronically Signed   By: Franchot Gallo M.D.   On: 06/27/2019 14:47   VAS US RENAL ARTERY DUPLEX  Result Date: 06/29/2019 ABDOMINAL VISCERAL High Risk Factors: Hypertension. Limitations: Obesity and air/bowel gas. Performing Technologist: June Leap RDMS, RVT  Examination Guidelines: A complete evaluation includes B-mode imaging, spectral Doppler, color Doppler, and power Doppler as needed of all accessible portions of each vessel. Bilateral testing is considered an integral part of a complete examination. Limited examinations for reoccurring indications may be performed as noted.  Duplex Findings: +--------------------+--------+--------+------+--------+ Mesenteric          PSV cm/sEDV cm/sPlaqueComments +--------------------+--------+--------+------+--------+ Aorta at SMA           77      12                  +--------------------+--------+--------+------+--------+ Celiac Artery Origin  292                          +--------------------+--------+--------+------+--------+ SMA Proximal          290                           +--------------------+--------+--------+------+--------+    +------------------+--------+--------+--------------+ Right Renal ArteryPSV cm/sEDV cm/s   Comment     +------------------+--------+--------+--------------+ Origin                            not visualized +------------------+--------+--------+--------------+ Proximal                          not visualized +------------------+--------+--------+--------------+ Mid                  90      9                   +------------------+--------+--------+--------------+ Distal              101      18                  +------------------+--------+--------+--------------+ +-----------------+--------+--------+--------------+ Left Renal ArteryPSV cm/sEDV cm/s   Comment     +-----------------+--------+--------+--------------+ Origin                           not visualized +-----------------+--------+--------+--------------+ Proximal                         not visualized +-----------------+--------+--------+--------------+ Mid                 67  21                  +-----------------+--------+--------+--------------+ Distal              97      28                  +-----------------+--------+--------+--------------+  Technologist observations: Unable to obtain intrarenal waveforms for resistive index. +------------+--------+--------+--+-----------+--------+--------+----+ Right KidneyPSV cm/sEDV cm/sRILeft KidneyPSV cm/sEDV cm/sRI   +------------+--------+--------+--+-----------+--------+--------+----+ Upper Pole                    Upper Pole                      +------------+--------+--------+--+-----------+--------+--------+----+ Mid                           Mid        47      19      0.60 +------------+--------+--------+--+-----------+--------+--------+----+ Lower Pole                    Lower Pole 25      8       0.70  +------------+--------+--------+--+-----------+--------+--------+----+ Hilar                         Hilar                           +------------+--------+--------+--+-----------+--------+--------+----+ +------------------+----+------------------+-----+ Right Kidney          Left Kidney             +------------------+----+------------------+-----+ RAR                   RAR                     +------------------+----+------------------+-----+ RAR (manual)          RAR (manual)            +------------------+----+------------------+-----+ Cortex                Cortex                  +------------------+----+------------------+-----+ Cortex thickness      Corex thickness         +------------------+----+------------------+-----+ Kidney length (cm)8.32Kidney length (cm)10.89 +------------------+----+------------------+-----+   Summary: Renal:  Right: Limited exam due to body habitus. No evidence of renal artery        stenosis in visualized segments. Left:  Limited exam due to body habitus. No evidence of renal artery        stenosis in visualized segments. 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  *See table(s) above for measurements and observations.     Preliminary     Microbiology: Recent Results (from the past 240 hour(s))  SARS Coronavirus 2 by RT PCR (hospital order, performed in [facility] hospital lab) Nasopharyngeal Nasopharyngeal Swab     Status: None   Collection Time: 06/27/19  3:28 PM   Specimen: Nasopharyngeal Swab  Result Value Ref Range Status   SARS Coronavirus 2 NEGATIVE NEGATIVE Final    Comment: (NOTE) SARS-CoV-2 target nucleic acids are NOT DETECTED. The SARS-CoV-2 RNA is generally detectable in upper and lower respiratory specimens during the acute phase of infection. The lowest concentration of SARS-CoV-2 viral copies this assay can detect  is 250 copies / mL. A negative result does not preclude  SARS-CoV-2 infection and should not be used as the sole basis for treatment or other patient management decisions.  A negative result may occur with improper specimen collection / handling, submission of specimen other than nasopharyngeal swab, presence of viral mutation(s) within the areas targeted by this assay, and inadequate number of viral copies (<250 copies / mL). A negative result must be combined with clinical observations, patient history, and epidemiological information. Fact Sheet for Patients:   StrictlyIdeas.no Fact Sheet for Healthcare Providers: BankingDealers.co.za This test is not yet approved or cleared  by the [place] FDA and has been authorized for detection and/or diagnosis of SARS-CoV-2 by FDA under an Emergency Use Authorization (EUA).  This EUA will remain in effect (meaning this test can be used) for the duration of the COVID-19 declaration under Section 564(b)(1) of the Act, 21 U.S.C. section 360bbb-3(b)(1), unless the authorization is terminated or revoked sooner. Performed at [facility], [place] [address] [place], [place] [address]   MRSA PCR Screening     Status: None   Collection Time: 06/27/19 11:06 PM   Specimen: Nasopharyngeal  Result Value Ref Range Status   MRSA by PCR NEGATIVE NEGATIVE Final    Comment:        The GeneXpert MRSA Assay (FDA approved for NASAL specimens only), is one component of a comprehensive MRSA colonization surveillance program. It is not intended to diagnose MRSA infection nor to guide or monitor treatment for MRSA infections. Performed at [facility], [place] [address] [place], [place] [address]      Labs: Basic Metabolic Panel: Recent Labs  Lab 06/27/19 1434 06/27/19 1434 06/27/19 1459 06/27/19 2359 06/28/19 0409 06/29/19 0218 06/30/19 0743  NA 138   < > 141 139 141 140 138  K 6.1*   < > 3.9 3.6 3.6 4.6 4.3  CL 103   < > 102 106 107 107 106    CO2 27  --   --  22 24 22 23   GLUCOSE 148*   < > 140* 138* 112* 112* 93  BUN 12   < > 12 12 11 19  26*  CREATININE 1.47*   < > 1.50* 1.64* 1.55* 2.01* 1.97*  CALCIUM 8.8*  --   --  8.7* 8.6* 8.4* 8.6*  MG  --   --   --   --  1.9  --   --   PHOS  --   --   --   --  3.9  --   --    < > = values in this interval not displayed.   Liver Function Tests: Recent Labs  Lab 06/27/19 1434 06/29/19 0218  AST 43* 13*  ALT 20 10  ALKPHOS 84 66  BILITOT 0.6 0.9  PROT 7.8 6.7  ALBUMIN 3.3* 2.8*   No results for input(s): LIPASE, AMYLASE in the last 168 hours. No results for input(s): AMMONIA in the last 168 hours. CBC: Recent Labs  Lab 06/27/19 1434 06/27/19 1459 06/28/19 0409  WBC 9.4  --  10.0  NEUTROABS 6.3  --   --   HGB 13.2 14.3 13.2  HCT 41.9 42.0 40.4  MCV 78.3*  --  76.5*  PLT 318  --  328   Cardiac Enzymes: No results for input(s): CKTOTAL, CKMB, CKMBINDEX, TROPONINI in the last 168 hours. BNP: BNP (last 3 results) No results for input(s): BNP in the last 8760 hours.  ProBNP (last 3 results) No results for input(s): PROBNP in the last 8760 hours.  CBG: Recent Labs  Lab 06/29/19 1147 06/29/19 1612 06/29/19 2152 06/30/19 0622 06/30/19 0722  GLUCAP 180* 77 221* 84 94       Signed:  Kayleen Memos, MD [facility] 06/30/2019, 9:24 AM

## 2019-06-30 NOTE — Progress Notes (Signed)
Inpatient Rehab Admissions Coordinator:   Acute PT changing recommendations for discharge to OPPT and stating that Pt. No longer needs CIR consult. CIR will sign off and discontinue consult order.   Clemens Catholic, [place], [place] Admissions Coordinator  [phone] ([place]) [phone] (office)

## 2019-06-30 NOTE — Progress Notes (Signed)
Inpatient Rehab Admissions Coordinator Note:   Per therapy recommendations, pt was screened for CIR candidacy by Alyna Stensland, MS CCC-SLP. At this time, Pt. Appears to have functional decline and is a good candidate for CIR. Will place order for rehab consult per protocol.  Please contact me with questions.   Khalin Royce, MS, CCC-SLP Rehab Admissions Coordinator  [phone] ([place]l) [phone] (office)  

## 2019-06-30 NOTE — Progress Notes (Addendum)
Physical Therapy Treatment Patient Details Name: Belinda Ramirez MRN: [id number] DOB: [birth year] Today's Date: 06/30/2019    History of Present Illness 44 yo female with onset of hypertensive crisis with L side weakness and worsening of stroke symptoms from 2020 was admitted.  Pt has been unable to work since stroke, but no new findings on MRI.  PMHx:  intracranial hemorrhage, HTN, and has been compliant with an extensive anti-hypertensive regimen.  She presented yesterday with numbness of left face/tongue with weakness.     PT Comments    Pt supine in bed on arrival.  Purewick in place but not working properly and patient lying on soiled bed pad.  Required encouragement to move OOB to perform pericare.  Pt is moving more than previous session and will update recommendations to match OT recs.  Will inform supervising PT of need for change in recommendations.      Follow Up Recommendations  Outpatient PT     Equipment Recommendations  3in1 (PT)(wide bed side commode due to body habitus)    Recommendations for Other Services       Precautions / Restrictions Precautions Precautions: Fall Precaution Comments: monitor pulse, O2 sats Restrictions Weight Bearing Restrictions: No    Mobility  Bed Mobility Overal bed mobility: Needs Assistance Bed Mobility: Supine to Sit     Supine to sit: Min assist     General bed mobility comments: Used bed rail and pulled up into sitting with support from RN.  Transfers Overall transfer level: Needs assistance Equipment used: Rolling walker (2 wheeled) Transfers: Sit to/from Stand Sit to Stand: Supervision         General transfer comment: Increased time and effort but able to rise in standing from bed and from commode.  Pt presents with flexed posture.  Ambulation/Gait Ambulation/Gait assistance: Min guard Gait Distance (Feet): 10 Feet(+ 45 ft) Assistive device: Rolling walker (2 wheeled) Gait Pattern/deviations: Step-to  pattern;Wide base of support;Shuffle;Decreased stride length;Trunk flexed Gait velocity: variable   General Gait Details: Cues for upper trunk control.  As patient fatigues steps become shorter and slower.  Gt speeds remains variable.  Pt reports," I need to see where I am going, so i go slow."  Speeds continue to change.   Stairs             Wheelchair Mobility    Modified Rankin (Stroke Patients Only)       Balance Overall balance assessment: Needs assistance   Sitting balance-Leahy Scale: Fair       Standing balance-Leahy Scale: Fair                              Cognition Arousal/Alertness: Awake/alert Behavior During Therapy: WFL for tasks assessed/performed Overall Cognitive Status: History of cognitive impairments - at baseline                                 General Comments: repetitive instructions for mobility wiht both verbal and tactile cues      Exercises      General Comments        Pertinent Vitals/Pain Pain Assessment: No/denies pain    Home Living Family/patient expects to be discharged to:: Private residence Living Arrangements: Spouse/significant other                  Prior Function  PT Goals (current goals can now be found in the care plan section) Acute Rehab PT Goals Patient Stated Goal: to be independent Potential to Achieve Goals: Good Progress towards PT goals: Progressing toward goals    Frequency    Min 3X/week      PT Plan Current plan remains appropriate    Co-evaluation              AM-PAC PT "6 Clicks" Mobility   Outcome Measure  Help needed turning from your back to your side while in a flat bed without using bedrails?: A Little Help needed moving from lying on your back to sitting on the side of a flat bed without using bedrails?: A Little Help needed moving to and from a bed to a chair (including a wheelchair)?: A Little Help needed standing up from a  chair using your arms (e.g., wheelchair or bedside chair)?: A Little Help needed to walk in hospital room?: A Little Help needed climbing 3-5 steps with a railing? : A Little 6 Click Score: 18    End of Session Equipment Utilized During Treatment: Gait belt Activity Tolerance: Patient limited by fatigue;Treatment limited secondary to medical complications (Comment) Patient left: in bed;with call bell/phone within reach;with bed alarm set Nurse Communication: Mobility status PT Visit Diagnosis: Unsteadiness on feet (R26.81);Muscle weakness (generalized) (M62.81);History of falling (Z91.81)     Time: 8882-8003 PT Time Calculation (min) (ACUTE ONLY): 37 min  Charges:  $Gait Training: 8-22 mins $Therapeutic Activity: 8-22 mins                     Erasmo Leventhal , PTA Acute Rehabilitation Services Pager [phone] Office [phone]     Belinda Ramirez 06/30/2019, 11:22 AM

## 2019-07-01 ENCOUNTER — Other Ambulatory Visit: Payer: Self-pay | Admitting: Nurse Practitioner

## 2019-07-01 DIAGNOSIS — G4733 Obstructive sleep apnea (adult) (pediatric): Secondary | ICD-10-CM

## 2019-07-02 ENCOUNTER — Other Ambulatory Visit: Payer: Self-pay | Admitting: Neurology

## 2019-07-02 ENCOUNTER — Telehealth: Payer: Self-pay

## 2019-07-02 DIAGNOSIS — I639 Cerebral infarction, unspecified: Secondary | ICD-10-CM

## 2019-07-02 MED FILL — ATORVASTATIN 80 MG TABLET: 80 | 90 days supply | Qty: 90 | Fill #0

## 2019-07-02 MED FILL — AMLODIPINE BESYLATE 10 MG T: 10 | 90 days supply | Qty: 90 | Fill #0

## 2019-07-02 MED FILL — CLOPIDOGREL 75 MG TABLET: 75 | 21 days supply | Qty: 21 | Fill #0

## 2019-07-02 MED FILL — LISINOPRIL 40 MG TABLET: 40 | 60 days supply | Qty: 60 | Fill #0

## 2019-07-02 NOTE — Telephone Encounter (Signed)
Transition Care Management Follow-up Telephone Call  Date of discharge and from where: [facility]/2019 [facility]   How have you been since you were released from the hospital? [place] better  Any questions or concerns? None   Items Reviewed: Did the pt receive and understand the discharge instructions provided? YES Medications obtained and verified? Not yet meds were sent to [facility] / spoke with Willey Blade will transfer at [facility] as per pt request  Any new allergies since your discharge? Yes  Dietary orders reviewed? Yes  Do you have support at home? None   Functional Questionnaire: (I = Independent and D = Dependent) ADLs: I some assistance with walker   Follow up appointments reviewed:  PCP Hospital f/u appt confirmed?  Scheduled to see NP Geryl Rankins on 07/04/2019 at [facility] f/u appt confirmed? Not yet  Are transportation arrangements needed? NO If their condition worsens, /is the pt aware to call PCP or go to the Emergency Dept.? Pt is aware if condition is worsenng or start experiencing any of diff breathing, fevers,. bleeding , SOB, chest pain, rapid weight gain, severe uncontrolled pain, or visual disturbances to return to ED Was the patient provided with contact information for the PCP's office or ED? YES  Was to pt encouraged to call back with questions or concerns?YES name and contact information given.  [facility]/ pt was contacted today appt made

## 2019-07-03 MED FILL — ACCU-CHEK GUIDE TEST STRIP: 30 days supply | Qty: 100 | Fill #0

## 2019-07-03 MED FILL — ACCU-CHEK FASTCLIX LANCETS: 30 days supply | Qty: 102 | Fill #0

## 2019-07-04 ENCOUNTER — Other Ambulatory Visit: Payer: Self-pay

## 2019-07-04 ENCOUNTER — Ambulatory Visit: Payer: Medicaid Other | Attending: Nurse Practitioner | Admitting: Nurse Practitioner

## 2019-07-04 ENCOUNTER — Encounter: Payer: Self-pay | Admitting: Nurse Practitioner

## 2019-07-04 DIAGNOSIS — D573 Sickle-cell trait: Secondary | ICD-10-CM | POA: Insufficient documentation

## 2019-07-04 DIAGNOSIS — F32A Depression, unspecified: Secondary | ICD-10-CM

## 2019-07-04 DIAGNOSIS — E782 Mixed hyperlipidemia: Secondary | ICD-10-CM

## 2019-07-04 DIAGNOSIS — Z8249 Family history of ischemic heart disease and other diseases of the circulatory system: Secondary | ICD-10-CM | POA: Insufficient documentation

## 2019-07-04 DIAGNOSIS — Z7982 Long term (current) use of aspirin: Secondary | ICD-10-CM | POA: Insufficient documentation

## 2019-07-04 DIAGNOSIS — I10 Essential (primary) hypertension: Secondary | ICD-10-CM

## 2019-07-04 DIAGNOSIS — F419 Anxiety disorder, unspecified: Secondary | ICD-10-CM

## 2019-07-04 DIAGNOSIS — F329 Major depressive disorder, single episode, unspecified: Secondary | ICD-10-CM | POA: Diagnosis not present

## 2019-07-04 DIAGNOSIS — E669 Obesity, unspecified: Secondary | ICD-10-CM | POA: Diagnosis not present

## 2019-07-04 DIAGNOSIS — Z8673 Personal history of transient ischemic attack (TIA), and cerebral infarction without residual deficits: Secondary | ICD-10-CM | POA: Diagnosis not present

## 2019-07-04 DIAGNOSIS — E7800 Pure hypercholesterolemia, unspecified: Secondary | ICD-10-CM | POA: Diagnosis not present

## 2019-07-04 DIAGNOSIS — G459 Transient cerebral ischemic attack, unspecified: Secondary | ICD-10-CM | POA: Diagnosis not present

## 2019-07-04 MED ORDER — ESCITALOPRAM OXALATE 20 MG PO TABS: 20.0000 mg | ORAL_TABLET | Freq: Every day | ORAL | 1 refills | Status: AC

## 2019-07-04 MED ORDER — AMLODIPINE BESYLATE 10 MG PO TABS: 10.0000 mg | ORAL_TABLET | Freq: Every day | ORAL | 0 refills | Status: AC

## 2019-07-04 MED ORDER — ALBUTEROL SULFATE HFA 108 (90 BASE) MCG/ACT IN AERS: INHALATION_SPRAY | RESPIRATORY_TRACT | 2 refills | Status: DC

## 2019-07-04 MED ORDER — LISINOPRIL 40 MG PO TABS: 40.0000 mg | ORAL_TABLET | Freq: Every day | ORAL | 1 refills | Status: AC

## 2019-07-04 MED ORDER — CLOPIDOGREL BISULFATE 75 MG PO TABS: 75.0000 mg | ORAL_TABLET | Freq: Every day | ORAL | 0 refills | Status: AC

## 2019-07-04 MED ORDER — ATORVASTATIN CALCIUM 80 MG PO TABS: 80.0000 mg | ORAL_TABLET | Freq: Every day | ORAL | 2 refills | Status: AC

## 2019-07-04 NOTE — Progress Notes (Signed)
Virtual Visit via Telephone Note Due to national recommendations of social distancing due to Belinda Ramirez, telehealth visit is felt to be most appropriate for this patient at this time.  I discussed the limitations, risks, security and privacy concerns of performing an evaluation and management service by telephone and the availability of in person appointments. I also discussed with the patient that there may be a patient responsible charge related to this service. The patient expressed understanding and agreed to proceed.    I connected with Belinda Ramirez on 05/Ramirez/21  at  10:50 AM EDT  EDT by telephone and verified that I am speaking with the correct person using two identifiers.   Consent I discussed the limitations, risks, security and privacy concerns of performing an evaluation and management service by telephone and the availability of in person appointments. I also discussed with the patient that there may be a patient responsible charge related to this service. The patient expressed understanding and agreed to proceed.   Location of Patient: Private Residence   Location of Provider: [place] and [place] Ramirez participating in Telemedicine visit: Belinda Rankins FNP-BC [place]    History of Present Illness: Telemedicine visit for: Hospital F/U  Admitted to the hospital for 3 days with complaints of left sided facial numbness and left-sided weakness.  Head CT, CTA, MRI were all negative for any acute process.  DX R TIA.  Renal ultrasound negative for renal stenosis.   She was instructed to follow-up with Belinda Ramirez Belinda Ramirez who she had seen in the past however unfortunately she has been dismissed from that clinic and has been referred to Belinda Ramirez at this time. She is awaiting scheduling. She was started on plavix/ASA for 3 weeks and instructed to continue ASA alone after the 3 weeks for TIA Sleep apnea test  has been scheduled for Friday.  She has an appt with [place] She is currently taking lisinopril 40 mg daily, amlodipine 10 mg daily and Clonidine 0.2 mg TID.  Doing well today. I have recommended she start checking her blood pressure daily and bring log to office visit. I ordered her a device for home use however she has not been monitoring her blood pressure with it.     Past Medical History:  Diagnosis Date  . High cholesterol   . 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 Hypertension   . Obesity   . Sickle cell trait ([place])   . Stroke ([place])   . Urinary incontinence    For years. Resolved 5/Ramirez spontaneously    Past Surgical History:  Procedure Laterality Date  . TUBAL LIGATION    . WISDOM TOOTH EXTRACTION      Family History  Problem Relation Age of Onset  . Heart attack Maternal Grandfather   . Diabetes Mother   . Hypertension Mother   . Cancer Father   . Heart disease Maternal Grandmother   . Diabetes Sister   . Diabetes Brother   . Breast cancer Maternal Aunt     Social History   Socioeconomic History  . Marital status: Single    Spouse name: Not on file  . Number of children: 0  . Years of education: 42  . Highest education level: 12th  grade  Occupational History  . Occupation: disabled  Tobacco Use  . Smoking status: Never Smoker  . Smokeless tobacco: Never Used  Substance and Sexual Activity  . Alcohol use: Yes    Alcohol/week: 0.0 standard drinks    Comment: occaisonal  . Drug use: No  . Sexual activity: Yes    Birth control/protection: Other-see comments    Comment: BTL  Other Topics Concern  . Not on file  Social History Narrative   Drinks soda and tea daily       Patient is right-handed. She lives alone in a one level home. She  drinks one cup of coffee, two glasse of tea and 1 12 oz can of Pepsi a day. She is unable to exercise.   Social Determinants of Health   Financial Resource Strain:   . Difficulty of Paying Living Expenses:   Food Insecurity:   . Worried About Charity fundraiser in the Last Year:   . Arboriculturist in the Last Year:   Transportation Needs:   . Film/video editor (Medical):   Marland Kitchen Lack of Transportation (Non-Medical):   Physical Activity:   . Days of Exercise per Week:   . Minutes of Exercise per Session:   Stress:   . Feeling of Stress :   Social Connections:   . Frequency of Communication with Friends and Family:   . Frequency of Social Gatherings with Friends and Family:   . Attends Religious Services:   . Active Member of Clubs or Organizations:   . Attends Archivist Meetings:   Marland Kitchen Marital Status:      Observations/Objective: Awake, alert and oriented x 3   Review of Systems  Constitutional: Negative for fever, malaise/fatigue and weight loss.  HENT: Negative.  Negative for nosebleeds.   Eyes: Negative.  Negative for blurred vision, double vision and photophobia.  Respiratory: Positive for cough. Negative for shortness of breath.   Cardiovascular: Negative.  Negative for chest pain, palpitations and leg swelling.  Gastrointestinal: Negative.  Negative for heartburn, nausea and vomiting.  Musculoskeletal: Negative.  Negative for myalgias.  Neurological: Positive for weakness. Negative for dizziness, focal weakness, seizures and headaches.  Psychiatric/Behavioral: Positive for depression. Negative for suicidal ideas. The patient is nervous/anxious.     Assessment and Plan: Belinda Ramirez was seen today for hospitalization follow-up.  Diagnoses and all orders for this visit:  Essential hypertension -     lisinopril (ZESTRIL) 40 MG tablet; Take 1 tablet (40 mg total) by mouth daily. -     amLODipine (NORVASC) 10 MG tablet; Take 1 tablet (10 mg total) by mouth  daily. Continue all antihypertensives as prescribed.  Remember to bring in your blood pressure log with you for your follow up appointment.  DASH/Mediterranean Diets are healthier choices for HTN.    Mixed hyperlipidemia -     atorvastatin (LIPITOR) 80 MG tablet; Take 1 tablet (80 mg total) by mouth daily at 6 PM. INSTRUCTIONS: Work on a low fat, heart healthy diet and participate in regular aerobic exercise program by working out at least 150 minutes per week; 5 days a week-30 minutes per day. Avoid red meat/beef/steak,  fried foods. junk foods, sodas, sugary drinks, unhealthy snacking, alcohol and smoking.  Drink at least 80 oz of water per day and monitor your carbohydrate intake daily.    TIA (transient ischemic attack) -     clopidogrel (PLAVIX) 75 MG tablet; Take 1 tablet (75 mg total) by mouth daily for  21 days. She was advised to take for 3 weeks then resume 81mg  ASA until seen by Neurology. She does have a history of hemorrhagic stroke so would like Neurology to clarify if she needs to continue on ASA.   Anxiety and depression -     escitalopram (LEXAPRO) 20 MG tablet; Take 1 tablet (20 mg total) by mouth daily.  Other orders -     albuterol (VENTOLIN HFA) 108 (90 Base) MCG/ACT inhaler; INHALE 2 PUFFS INTO THE LUNGS EVERY 6 (SIX) HOURS AS NEEDED FOR WHEEZING OR SHORTNESS OF BREATH.     Follow Up Instructions Return in about 3 months (around 8/Ramirez/2021).     I discussed the assessment and treatment plan with the patient. The patient was provided an opportunity to ask questions and all were answered. The patient agreed with the plan and demonstrated an understanding of the instructions.   The patient was advised to call back or seek an in-person evaluation if the symptoms worsen or if the condition fails to improve as anticipated.  I provided Ramirez minutes of non-face-to-face time during this encounter including median intraservice time, reviewing previous notes, labs, imaging,  medications and explaining diagnosis and management.  Gildardo Pounds, FNP-BC

## 2019-07-05 MED FILL — ALBUTEROL SULFATE HFA 108 (: 108 (90 BAS | 25 days supply | Qty: 18 | Fill #0

## 2019-07-06 NOTE — Progress Notes (Deleted)
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  UPDATE: Current medications:  ASA 81mg ; atorvastatin 80mg ; amlodipine; lisinopirl  She was admitted to [facility] on 06/27/2019 for TIA.  She presented with transient left sided numbness of her face traveling to left arm with associated left arm weakness.  No associated headache.  TPA not given due to resolving symptoms and history of ICH.  Blood pressure was as high as 241/111, which was treated with Cardene (typically runs 147W systolic).  CT head showed no acute abnormality.  CTA of head and neck showed mild atherosclerosis but no large vessel occlusion or stenosis.  MRI of brain showed mild atrophy and advanced chronic small vessel disease with remote lacunar infarcts in cerebral white matter, right basal ganglia, thalami and pons and numerous supratentorial and infratentorial chronic microhemorrhages but no acute ischemic stroke or bleed.  2D echocardiogram showed EF 65-70% with mildly dilated LA but no source of embolus.  LDL was 120.  Hgb A1c was 6.5,  She was started on ASA 81mg  and Plavix 75mg  daily for 3 weeks, followed by ASA 81mg  daily alone.  She was continued on atorvastatin 80mg  daily.  HISTORY: She was admitted to [facility] on 03/28/2018 after presenting with one day of headache, slurred speech andworseningleft sided weaknessthat began after first dose of clonidine. She had recently been hospitalized for hypertensive emergency. Blood pressure in the ED was 178/95 but had been up to 207/110. CT of head was personally reviewed and showed chronic  small vessel ischemic changes but no acute abnormality. She was not a tPA candidate as she was outside the window. MRI of brain was personally reviewed and showed small acute right corona radiata and centrum semiovale infarcts. Atrophy, chronic microhemorrhages and remote lacunar infarcts involving both basal ganglia, thalami and pons also noted. MRA of head was personally reviewed and showed no emergent large vessel occlusion or stenosis. Carotid doppler revealed 1-39% bilateral ICA stenosis with antegrade flow of both vertebral arteries. 2D Echo showed EF above 65% with no source of embolus. LDL was 99. Hgb A1c was 6.2. Blood pressure medications were held for permissive hypertension. Upon discharge, lisinopril and amlodipine were resumed. Clonidine was discontinued. She reportedlyhad not been on antiplatelet therapy and was started on ASA 81mg  daily.However, she reports to me that she had been taking one ASA daily.She was continued on Lipitor 80mg  daily.  She has residual left sided weakness. When she is watching TV, she sometimes sees double. She still has intermittent headaches, moderate right occipital pounding that radiates down the right posterior neck. There is associated nausea, photophobia and phonophobia. It lasts 1 to 2 hours and has been occurring once every 2 days. She has not been treating it with analgesics.  PAST MEDICAL HISTORY: Past Medical History:  Diagnosis Date  . High cholesterol   . 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 Hypertension   . Obesity   . Sickle cell trait ([place])   . Stroke ([place])   . Urinary  incontinence    For years. Resolved 5/19 spontaneously    MEDICATIONS: Current Outpatient Medications on File Prior to Visit  Medication Sig Dispense  Refill  . Accu-Chek FastClix Lancets MISC Use as instructed. Inject into the skin twice daily 100 each 1  . acetaminophen (TYLENOL) 500 MG tablet Take 2 tablets (1,000 mg total) by mouth every 6 (six) hours as needed. (Patient taking differently: Take 1,000 mg by mouth every 6 (six) hours as needed for mild pain or headache. ) 30 tablet 0  . albuterol (VENTOLIN HFA) 108 (90 Base) MCG/ACT inhaler INHALE 2 PUFFS INTO THE LUNGS EVERY 6 (SIX) HOURS AS NEEDED FOR WHEEZING OR SHORTNESS OF BREATH. 18 g 2  . amLODipine (NORVASC) 10 MG tablet Take 1 tablet (10 mg total) by mouth daily. 90 tablet 0  . aspirin 81 MG EC tablet Take 1 tablet (81 mg total) by mouth daily. (Patient not taking: Reported on 07/04/2019) 360 tablet 0  . atorvastatin (LIPITOR) 80 MG tablet Take 1 tablet (80 mg total) by mouth daily at 6 PM. 90 tablet 2  . Blood Glucose Calibration (ACCU-CHEK GUIDE CONTROL) LIQD 1 each by In Vitro route once as needed for up to 1 dose. 1 each 0  . Blood Pressure Monitor DEVI Please provide patient with insurance approved blood pressure monitor 1 Device 0  . cloNIDine (CATAPRES) 0.2 MG tablet Take 1 tablet (0.2 mg total) by mouth 3 (three) times daily. 180 tablet 0  . clopidogrel (PLAVIX) 75 MG tablet Take 1 tablet (75 mg total) by mouth daily for 21 days. 21 tablet 0  . escitalopram (LEXAPRO) 20 MG tablet Take 1 tablet (20 mg total) by mouth daily. 90 tablet 1  . fluticasone (FLONASE) 50 MCG/ACT nasal spray Place 2 sprays into both nostrils daily. (Patient taking differently: Place 2 sprays into both nostrils daily as needed for allergies or rhinitis. ) 16 g 6  . glucose blood (ACCU-CHEK GUIDE) test strip Use as instructed. Check blood glucose by fingerstick twice per day. 100 each 1  . hydrOXYzine (ATARAX/VISTARIL) 25 MG tablet Take 25 mg by mouth in the morning.     Marland Kitchen lisinopril (ZESTRIL) 40 MG tablet Take 1 tablet (40 mg total) by mouth daily. 90 tablet 1  . Misc. Devices MISC Please provide patient  with insurance approved thigh high compression stockings for swelling. 1 each 0  . Misc. Devices MISC Please provide patient with insurance approved adult diapers size 3XL for urinary incontinence. ICD-10 N39.46 1 each 0  . Vitamin D, Ergocalciferol, (DRISDOL) 1.25 MG (50000 UNIT) CAPS capsule Take 50,000 Units by mouth every 7 (seven) days.     No current facility-administered medications on file prior to visit.    ALLERGIES: Allergies  Allergen Reactions  . Oxycodone Hives  . Percocet [Oxycodone-Acetaminophen] Hives  . Amoxicillin Hives    Has patient had a PCN reaction causing immediate rash, facial/tongue/throat swelling, SOB or lightheadedness with hypotension: No Has patient had a PCN reaction causing severe rash involving mucus membranes or skin necrosis: No Has patient had a PCN reaction that required hospitalization: No Has patient had a PCN reaction occurring within the last 10 years: Yes If all of the above answers are "NO", then may proceed with Cephalosporin use.   . Aspirin Other (See Comments)    Hx of hemorrhagic stroke (pt does take low dose aspirin )  . Latex Itching  . Tape Rash    FAMILY HISTORY: Family History  Problem Relation Age of  Onset  . Heart attack Maternal Grandfather   . Diabetes Mother   . Hypertension Mother   . Cancer Father   . Heart disease Maternal Grandmother   . Diabetes Sister   . Diabetes Brother   . Breast cancer Maternal Aunt     SOCIAL HISTORY: Social History   Socioeconomic History  . Marital status: Single    Spouse name: Not on file  . Number of children: 0  . Years of education: 27  . Highest education level: 12th grade  Occupational History  . Occupation: disabled  Tobacco Use  . Smoking status: Never Smoker  . Smokeless tobacco: Never Used  Substance and Sexual Activity  . Alcohol use: Yes    Alcohol/week: 0.0 standard drinks    Comment: occaisonal  . Drug use: No  . Sexual activity: Yes    Birth  control/protection: Other-see comments    Comment: BTL  Other Topics Concern  . Not on file  Social History Narrative   Drinks soda and tea daily       Patient is right-handed. She lives alone in a one level home. She drinks one cup of coffee, two glasse of tea and 1 12 oz can of Pepsi a day. She is unable to exercise.   Social Determinants of Health   Financial Resource Strain:   . Difficulty of Paying Living Expenses:   Food Insecurity:   . Worried About Charity fundraiser in the Last Year:   . Arboriculturist in the Last Year:   Transportation Needs:   . Film/video editor (Medical):   Marland Kitchen Lack of Transportation (Non-Medical):   Physical Activity:   . Days of Exercise per Week:   . Minutes of Exercise per Session:   Stress:   . Feeling of Stress :   Social Connections:   . Frequency of Communication with Friends and Family:   . Frequency of Social Gatherings with Friends and Family:   . Attends Religious Services:   . Active Member of Clubs or Organizations:   . Attends Archivist Meetings:   Marland Kitchen Marital Status:   Intimate Partner Violence:   . Fear of Current or Ex-Partner:   . Emotionally Abused:   Marland Kitchen Physically Abused:   . Sexually Abused:     PHYSICAL EXAM: *** General: No acute distress.  Patient appears well-groomed.   Head:  Normocephalic/atraumatic Eyes:  Fundi examined but not visualized Neck: supple, no paraspinal tenderness, full range of motion Heart:  Regular rate and rhythm Lungs:  Clear to auscultation bilaterally Back: No paraspinal tenderness Neurological Exam: alert and oriented to person, place, and time. Attention span and concentration intact, recent and remote memory intact, fund of knowledge intact.  Speech fluent and not dysarthric, language intact.  CN II-XII intact. Bulk and tone normal, muscle strength 5/5 throughout.  Sensation to light touch, temperature and vibration intact.  Deep tendon reflexes 2+ throughout, toes downgoing.   Finger to nose and heel to shin testing intact.  Gait normal, Romberg negative.  IMPRESSION: 1.  Right hemispheric TIA in setting of hypertensive urgency 2.  HTN 3.  HLD 4.  History of PRES  PLAN: 1.  ***  Metta Clines, DO  CC: ***

## 2019-07-09 ENCOUNTER — Ambulatory Visit: Payer: Medicaid Other | Admitting: Neurology

## 2019-07-17 ENCOUNTER — Other Ambulatory Visit (HOSPITAL_COMMUNITY): Payer: Medicaid Other

## 2019-07-17 ENCOUNTER — Ambulatory Visit: Payer: Medicaid Other | Admitting: Nurse Practitioner

## 2019-07-18 ENCOUNTER — Encounter (HOSPITAL_BASED_OUTPATIENT_CLINIC_OR_DEPARTMENT_OTHER): Payer: Medicaid Other | Admitting: Internal Medicine

## 2019-07-19 ENCOUNTER — Encounter (HOSPITAL_BASED_OUTPATIENT_CLINIC_OR_DEPARTMENT_OTHER): Payer: Medicaid Other | Admitting: Internal Medicine

## 2019-07-24 ENCOUNTER — Telehealth: Payer: Self-pay

## 2019-07-24 NOTE — Telephone Encounter (Signed)
Holly from Urology is checking to see if we received her fax on 6/3 for a prescription for the patient's incontinence pads

## 2019-07-25 NOTE — Telephone Encounter (Signed)
Will fax the form once completed.

## 2019-07-31 ENCOUNTER — Other Ambulatory Visit (HOSPITAL_COMMUNITY)
Admission: RE | Admit: 2019-07-31 | Discharge: 2019-07-31 | Disposition: A | Discharge status: Home / Self Care | Payer: Medicaid Other | Source: Ambulatory Visit | Attending: Internal Medicine | Admitting: Internal Medicine

## 2019-07-31 DIAGNOSIS — Z20822 Contact with and (suspected) exposure to covid-19: Secondary | ICD-10-CM | POA: Insufficient documentation

## 2019-07-31 DIAGNOSIS — Z01812 Encounter for preprocedural laboratory examination: Secondary | ICD-10-CM | POA: Diagnosis not present

## 2019-07-31 LAB — SARS CORONAVIRUS 2 (TAT 6-24 HRS): SARS Coronavirus 2: NEGATIVE

## 2019-08-02 ENCOUNTER — Other Ambulatory Visit: Payer: Self-pay

## 2019-08-02 ENCOUNTER — Ambulatory Visit (HOSPITAL_BASED_OUTPATIENT_CLINIC_OR_DEPARTMENT_OTHER): Payer: Medicaid Other | Attending: Nurse Practitioner | Admitting: Internal Medicine

## 2019-08-02 VITALS — Ht 65.0 in | Wt 280.0 lb

## 2019-08-02 DIAGNOSIS — G4733 Obstructive sleep apnea (adult) (pediatric): Secondary | ICD-10-CM | POA: Diagnosis present

## 2019-08-02 DIAGNOSIS — I10 Essential (primary) hypertension: Secondary | ICD-10-CM | POA: Diagnosis not present

## 2019-08-02 DIAGNOSIS — E669 Obesity, unspecified: Secondary | ICD-10-CM | POA: Diagnosis not present

## 2019-08-02 DIAGNOSIS — R0683 Snoring: Secondary | ICD-10-CM | POA: Diagnosis not present

## 2019-08-03 ENCOUNTER — Ambulatory Visit: Payer: Medicaid Other | Admitting: Neurology

## 2019-08-05 DIAGNOSIS — G4733 Obstructive sleep apnea (adult) (pediatric): Secondary | ICD-10-CM | POA: Diagnosis not present

## 2019-08-05 NOTE — Procedures (Signed)
Patient Name: Belinda Ramirez, Belinda Ramirez Date: 08/02/2019 Gender: Female D.O.B: February 06, 1976 Age (years): 41 Referring Provider: Gildardo Pounds NP Height (inches): 17 Interpreting Physician: Baird Lyons MD, ABSM Weight (lbs): 280 RPSGT: Jorge Ny BMI: 47 MRN: [id number] Neck Size: 18.00  CLINICAL INFORMATION Sleep Study Type: Split Night CPAP Indication for sleep study: Hypertension, Obesity, OSA, Snoring, Witnessed Apneas Epworth Sleepiness Score: 16  SLEEP STUDY TECHNIQUE As per the AASM Manual for the Scoring of Sleep and Associated Events v2.3 (April 2016) with a hypopnea requiring 4% desaturations.  The channels recorded and monitored were frontal, central and occipital EEG, electrooculogram (EOG), submentalis EMG (chin), nasal and oral airflow, thoracic and abdominal wall motion, anterior tibialis EMG, snore microphone, electrocardiogram, and pulse oximetry. Continuous positive airway pressure (CPAP) was initiated when the patient met split night criteria and was titrated according to treat sleep-disordered breathing.  MEDICATIONS Medications self-administered by patient taken the night of the study : none reported  RESPIRATORY PARAMETERS Diagnostic  Total AHI (/hr): 28.3 RDI (/hr): 29.4 OA Index (/hr): 1.5 CA Index (/hr): 0.0 REM AHI (/hr): N/A NREM AHI (/hr): 28.3 Supine AHI (/hr): 27.1 Non-supine AHI (/hr): 30.9 Min O2 Sat (%): 88.0 Mean O2 (%): 95.5 Time below 88% (min): 0.2   Titration  Optimal Pressure (cm):  AHI at Optimal Pressure (/hr): N/A Min O2 at Optimal Pressure (%): 84.0 Supine % at Optimal (%): N/A Sleep % at Optimal (%): N/A   SLEEP ARCHITECTURE The recording time for the entire night was 427.3 minutes.  During a baseline period of 184.2 minutes, the patient slept for 163.2 minutes in REM and nonREM, yielding a sleep efficiency of 88.6%%. Sleep onset after lights out was 7.5 minutes with a REM latency of N/A minutes. The patient spent 6.1%%  of the night in stage N1 sleep, 93.9%% in stage N2 sleep, 0.0%% in stage N3 and 0% in REM.  During the titration period of 237.5 minutes, the patient slept for 206.5 minutes in REM and nonREM, yielding a sleep efficiency of 86.9%%. Sleep onset after CPAP initiation was 0.0 minutes with a REM latency of 31.2 minutes. The patient spent 2.0%% of the night in stage N1 sleep, 86.3%% in stage N2 sleep, 0.0%% in stage N3 and 11.6% in REM.  CARDIAC DATA The 2 lead EKG demonstrated sinus rhythm. The mean heart rate was 100.0 beats per minute. Other EKG findings include: None.  LEG MOVEMENT DATA The total Periodic Limb Movements of Sleep (PLMS) were 0. The PLMS index was 0.0 .  IMPRESSIONS - Moderate obstructive sleep apnea occurred during the diagnostic portion of the study(AHI = 28.3/hour). An optimal PAP pressure was selected (16 cwp). - No significant central sleep apnea occurred during the diagnostic portion of the study (CAI = 0.0/hour). - Moderate oxygen desaturation was noted during the diagnostic portion of the study (Min O2 =88.0%). Min sat on CPAP 16 was 94%. - The patient snored with moderate snoring volume during the diagnostic portion of the study. - No cardiac abnormalities were noted during this study. - Clinically significant periodic limb movements did not occur during sleep.  DIAGNOSIS - Obstructive Sleep Apnea (327.23 [G47.33 ICD-10])  RECOMMENDATIONS - Suggest trial on CPAP 16 or autopap 10-20. - Patient wore a medium ResMed AirFit F20 mask with heated humidity. - Be careful with alcohol, sedatives and other CNS depressants that may worsen sleep apnea and disrupt normal sleep architecture. - Sleep hygiene should be reviewed to assess factors that may improve sleep quality. -  Weight management and regular exercise should be initiated or continued.  [Electronically signed] 08/05/2019 09:31 AM  Baird Lyons MD, ABSM Diplomate, American Board of Sleep Medicine   NPI:  [id number]                         [place] [place] [facility]  ELECTRONICALLY SIGNED ON:  08/05/2019, 9:32 AM [place] PH: [phone]   FX: [phone] [place]

## 2019-08-06 ENCOUNTER — Other Ambulatory Visit: Payer: Self-pay | Admitting: Nurse Practitioner

## 2019-08-06 MED ORDER — MISC. DEVICES MISC: 0 refills | Status: AC

## 2019-08-07 ENCOUNTER — Telehealth: Payer: Self-pay | Admitting: Nurse Practitioner

## 2019-08-07 NOTE — Telephone Encounter (Signed)
Spoke to patient and informed the Rx has been sent.  Patient will receive a call from [facility] for pick up on her supplies. Pt. Understood.

## 2019-08-07 NOTE — Telephone Encounter (Signed)
Patient called in and requested for sleep apnea results. Patient was identified by 2 patient identifiers. Results were given, patient verbalized understanding and wanted a call regarding how the process works to get her cpap and supplies. Please follow up at your earliest convenience.

## 2019-08-13 MED FILL — traZODone HCL 50 MG TABS: 50 | 30 days supply | Qty: 60 | Fill #0

## 2019-08-13 MED FILL — hydrOXYzine HCL 25 MG TABS: 25 | 30 days supply | Qty: 60 | Fill #0

## 2019-08-13 MED FILL — ESCITALOPRAM 20 MG TABLET: 20 | 30 days supply | Qty: 30 | Fill #0

## 2019-08-23 NOTE — Telephone Encounter (Signed)
Patient called to get an update on her supplies, mask and computer.  She stated that she still has not received either.  Please advise and call patient to give her the status.  CB# [phone]

## 2019-08-24 ENCOUNTER — Telehealth: Payer: Self-pay | Admitting: Nurse Practitioner

## 2019-08-24 NOTE — Telephone Encounter (Signed)
Spoke to patient and informed CMA receive the form and fax over the sleeping study results. Pt. Is aware that they will contact her.

## 2019-08-24 NOTE — Telephone Encounter (Signed)
Please advise.   Copied from [place] [phone]. Topic: General - Other >> Aug 24, 2019  9:08 AM Rain[place] A wrote: Pt stated she was advised to call a "1-800" number for her cpap machine however, the company is requesting a copy of the sleep apnea results in order to release the machine. Please advise

## 2019-08-24 NOTE — Telephone Encounter (Signed)
Spoke to patient. Inform CMA fax the sleeping study results to [facility].

## 2019-08-30 NOTE — Telephone Encounter (Signed)
Caller name:  Larkin Ina with [facility]  Relation to pt: pharmacy department   Call back number: [phone]   Reason for call:  Checking on the status of CPAP orders

## 2019-08-30 NOTE — Telephone Encounter (Signed)
This request was handled by [place] for Belinda Ramirez.

## 2019-09-27 MED FILL — hydrALAZINE HCL 100 MG TABS: 100 | 30 days supply | Qty: 90 | Fill #0

## 2019-09-27 MED FILL — POTASSIUM CL ER 20 MEQ TABL: 20 | 30 days supply | Qty: 30 | Fill #0

## 2019-10-01 ENCOUNTER — Other Ambulatory Visit: Payer: Self-pay

## 2019-10-01 DIAGNOSIS — I774 Celiac artery compression syndrome: Secondary | ICD-10-CM

## 2019-10-01 DIAGNOSIS — I771 Stricture of artery: Secondary | ICD-10-CM

## 2019-10-04 ENCOUNTER — Ambulatory Visit (HOSPITAL_COMMUNITY)
Admission: RE | Admit: 2019-10-04 | Discharge: 2019-10-04 | Disposition: A | Discharge status: Home / Self Care | Payer: Medicaid Other | Source: Ambulatory Visit | Attending: Vascular Surgery | Admitting: Vascular Surgery

## 2019-10-04 ENCOUNTER — Encounter: Payer: Self-pay | Admitting: Vascular Surgery

## 2019-10-04 ENCOUNTER — Ambulatory Visit (INDEPENDENT_AMBULATORY_CARE_PROVIDER_SITE_OTHER): Payer: Medicaid Other | Admitting: Vascular Surgery

## 2019-10-04 ENCOUNTER — Other Ambulatory Visit: Payer: Self-pay

## 2019-10-04 VITALS — BP 205/126 | HR 76 | Temp 97.7°F | Resp 20 | Ht 65.0 in | Wt 291.0 lb

## 2019-10-04 DIAGNOSIS — K551 Chronic vascular disorders of intestine: Secondary | ICD-10-CM | POA: Diagnosis not present

## 2019-10-04 DIAGNOSIS — I771 Stricture of artery: Secondary | ICD-10-CM

## 2019-10-04 DIAGNOSIS — I774 Celiac artery compression syndrome: Secondary | ICD-10-CM | POA: Diagnosis not present

## 2019-10-04 NOTE — Progress Notes (Signed)
Referring Physician: Dr Lawson Radar  Patient name: Belinda Ramirez MRN: [id number] DOB: Aug 04, 1975 Sex: female  REASON FOR CONSULT: Mesenteric artery stenosis  HPI: Belinda Ramirez is a 44 y.o. 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  She is referred for further evaluation.  She has no abdominal pain.  She does not develop postprandial abdominal pain.  She has not been losing weight.  She has actually gained some weight.  She has had no prior abdominal operations.  She recently did have a stroke secondary to hypertension.  She does not smoke.  Other medical problems include hypertension, obesity, recent stroke all of which are currently stable elevated cholesterol.  Past Medical History:  Diagnosis Date  . High cholesterol   . 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 Hypertension   . Obesity   . Sickle cell trait ([place])   . Stroke ([place])   . Urinary incontinence    For years. Resolved 5/19 spontaneously   Past Surgical History:  Procedure Laterality Date  . TUBAL LIGATION    . WISDOM TOOTH EXTRACTION      Family History  Problem Relation Age of Onset  . Heart attack Maternal Grandfather   . Diabetes Mother   . Hypertension Mother   . Cancer Father   . Heart disease Maternal Grandmother   . Diabetes Sister   . Diabetes Brother   . Breast cancer Maternal Aunt     SOCIAL HISTORY: Social History   Socioeconomic History  . Marital status: Single    Spouse name: Not on file  . Number of children: 0  . Years of education: 68  . Highest education level: 12th grade  Occupational History  . Occupation: disabled  Tobacco Use  . Smoking status: Never Smoker  . Smokeless  tobacco: Never Used  Vaping Use  . Vaping Use: Never used  Substance and Sexual Activity  . Alcohol use: Yes    Alcohol/week: 0.0 standard drinks    Comment: occaisonal  . Drug use: No  . Sexual activity: Yes    Birth control/protection: Other-see comments    Comment: BTL  Other Topics Concern  . Not on file  Social History Narrative   Drinks soda and tea daily       Patient is right-handed. She lives alone in a one level home. She drinks one cup of coffee, two glasse of tea and 1 12 oz can of Pepsi a day. She is unable to exercise.   Social Determinants of Health   Financial Resource Strain:   . Difficulty of Paying Living Expenses: Not on file  Food Insecurity:   . Worried About Charity fundraiser in the Last Year: Not on file  . Ran Out of Food in the Last Year: Not on file  Transportation Needs:   . Lack of Transportation (Medical): Not on file  . Lack of Transportation (Non-Medical): Not on file  Physical Activity:   . Days of Exercise per Week: Not on file  . Minutes of Exercise per Session: Not on file  Stress:   . Feeling of Stress : Not on file  Social Connections:   . Frequency of Communication with Friends and Family: Not on file  .  Frequency of Social Gatherings with Friends and Family: Not on file  . Attends Religious Services: Not on file  . Active Member of Clubs or Organizations: Not on file  . Attends Archivist Meetings: Not on file  . Marital Status: Not on file  Intimate Partner Violence:   . Fear of Current or Ex-Partner: Not on file  . Emotionally Abused: Not on file  . Physically Abused: Not on file  . Sexually Abused: Not on file    Allergies  Allergen Reactions  . Oxycodone Hives  . Percocet [Oxycodone-Acetaminophen] Hives  . Amoxicillin Hives    Has patient had a PCN reaction causing immediate rash, facial/tongue/throat swelling, SOB or lightheadedness with hypotension: No Has patient had a PCN reaction causing severe rash  involving mucus membranes or skin necrosis: No Has patient had a PCN reaction that required hospitalization: No Has patient had a PCN reaction occurring within the last 10 years: Yes If all of the above answers are "NO", then may proceed with Cephalosporin use.   . Aspirin Other (See Comments)    Hx of hemorrhagic stroke (pt does take low dose aspirin )  . Latex Itching  . Tape Rash    Current Outpatient Medications  Medication Sig Dispense Refill  . Accu-Chek FastClix Lancets MISC Use as instructed. Inject into the skin twice daily 100 each 1  . acetaminophen (TYLENOL) 500 MG tablet Take 2 tablets (1,000 mg total) by mouth every 6 (six) hours as needed. (Patient taking differently: Take 1,000 mg by mouth every 6 (six) hours as needed for mild pain or headache. ) 30 tablet 0  . albuterol (VENTOLIN HFA) 108 (90 Base) MCG/ACT inhaler INHALE 2 PUFFS INTO THE LUNGS EVERY 6 (SIX) HOURS AS NEEDED FOR WHEEZING OR SHORTNESS OF BREATH. 18 g 2  . Blood Glucose Calibration (ACCU-CHEK GUIDE CONTROL) LIQD 1 each by In Vitro route once as needed for up to 1 dose. 1 each 0  . Blood Pressure Monitor DEVI Please provide patient with insurance approved blood pressure monitor 1 Device 0  . escitalopram (LEXAPRO) 20 MG tablet Take 1 tablet (20 mg total) by mouth daily. 90 tablet 1  . fluticasone (FLONASE) 50 MCG/ACT nasal spray Place 2 sprays into both nostrils daily. (Patient taking differently: Place 2 sprays into both nostrils daily as needed for allergies or rhinitis. ) 16 g 6  . glucose blood (ACCU-CHEK GUIDE) test strip Use as instructed. Check blood glucose by fingerstick twice per day. 100 each 1  . hydrALAZINE (APRESOLINE) 100 MG tablet Take 100 mg by mouth 3 (three) times daily.    . hydrOXYzine (ATARAX/VISTARIL) 25 MG tablet Take 25 mg by mouth in the morning.     . Misc. Devices MISC Please provide patient with insurance approved thigh high compression stockings for swelling. 1 each 0  . Misc.  Devices MISC Please provide patient with insurance approved adult diapers size 3XL for urinary incontinence. ICD-10 N39.46 1 each 0  . Misc. Devices MISC -Please provide patient with insurance approved:  CPAP with autopap 10-20. Medium ResMed AirFit F20 mask with heated humidity. DX G47.33 1 each 0  . potassium chloride SA (KLOR-CON) 20 MEQ tablet Take 20 mEq by mouth daily.    . traZODone (DESYREL) 50 MG tablet Take 50-100 mg by mouth at bedtime as needed.    Marland Kitchen amLODipine (NORVASC) 10 MG tablet Take 1 tablet (10 mg total) by mouth daily. 90 tablet 0  . atorvastatin (LIPITOR) 80 MG tablet  Take 1 tablet (80 mg total) by mouth daily at 6 PM. 90 tablet 2  . cloNIDine (CATAPRES) 0.2 MG tablet Take 1 tablet (0.2 mg total) by mouth 3 (three) times daily. 180 tablet 0  . lisinopril (ZESTRIL) 40 MG tablet Take 1 tablet (40 mg total) by mouth daily. 90 tablet 1   No current facility-administered medications for this visit.    ROS:   General:  No weight loss, Fever, chills  HEENT: No recent headaches, no nasal bleeding, no visual changes, no sore throat  Neurologic: No dizziness, blackouts, seizures. No recent symptoms of stroke or mini- stroke. No recent episodes of slurred speech, or temporary blindness.  Cardiac: No recent episodes of chest pain/pressure, no shortness of breath at rest.  No shortness of breath with exertion.  Denies history of atrial fibrillation or irregular heartbeat  Vascular: No history of rest pain in feet.  No history of claudication.  No history of non-healing ulcer, No history of DVT   Pulmonary: No home oxygen, no productive cough, no hemoptysis,  No asthma or wheezing  Musculoskeletal:  [ ]  Arthritis, [ ]  Low back pain,  [ ]  Joint pain  Hematologic:No history of hypercoagulable state.  No history of easy bleeding.  No history of anemia  Gastrointestinal: No hematochezia or melena,  No gastroesophageal reflux, no trouble swallowing  Urinary: [X]  chronic Kidney  disease, [ ]  on HD - [ ]  MWF or [ ]  TTHS, [ ]  Burning with urination, [ ]  Frequent urination, [ ]  Difficulty urinating;   Skin: No rashes  Psychological: No history of anxiety,  No history of depression   Physical Examination  Vitals:   10/04/19 0851  BP: (!) 205/126  Pulse: 76  Resp: 20  Temp: 97.7 F (36.5 C)  SpO2: 95%  Weight: 291 lb (132 kg)  Height: 5\' 5"  (1.651 m)    Body mass index is 48.42 kg/m.  General:  Alert and oriented, no acute distress HEENT: Normal Neck: No bruit Cardiac: Regular Rate and Rhythm Abdomen: Soft, non-tender, non-distended, no mass, obese Skin: No rash Extremity Pulses:  2+ radial, brachial, femoral, dorsalis pedis, posterior tibial pulses bilaterally Musculoskeletal: No deformity or edema  Neurologic: Upper and lower extremity motor 5/5 and symmetric  DATA:  I reviewed the patient's previous mesenteric duplex scan from May 2021.  This showed greater than 70% stenosis of the celiac and superior mesenteric artery.  Renals were not well visualized.  Patient had a repeat duplex scan today which showed no evidence of celiac artery stenosis possible mild superior mesenteric artery stenosis by velocities of only 239 cm/s.  Also reviewed the patient's recent renal function which was a serum creatinine between 1.5 and 2 over the last 6 months.  ASSESSMENT: Doubt patient has significant mesenteric artery occlusive disease based on her age and lack of risk factors.  Her primary risk factor is obesity which usually is not related to mesenteric occlusive disease.  She has no abdominal pain she has no food fear she has no weight loss.  Duplex today suggests minimal if any mesenteric occlusive disease.  Since the patient has no symptoms I would not pursue this further with CT angiogram especially in light of the fact that she has some renal dysfunction.   PLAN: Findings were discussed the patient today as well as diet options and exercise program for  weight loss.  She will call me if she develops abdominal pain in the future related to eating.  Otherwise she will  follow up on an as-needed basis.   Ruta Hinds, MD [facility] Office: [phone]

## 2019-10-05 ENCOUNTER — Other Ambulatory Visit: Payer: Self-pay

## 2019-10-05 ENCOUNTER — Ambulatory Visit: Payer: Medicaid Other | Admitting: Nurse Practitioner

## 2019-11-07 ENCOUNTER — Ambulatory Visit: Payer: Medicaid Other | Attending: Nurse Practitioner | Admitting: Nurse Practitioner

## 2019-11-08 ENCOUNTER — Telehealth: Payer: Self-pay | Admitting: Nurse Practitioner

## 2019-11-08 MED FILL — POTASSIUM CHLORIDE ER 10 ME: 10 | 30 days supply | Qty: 30 | Fill #0

## 2019-11-08 MED FILL — PROAIR HFA 90 MCG INHALER: 108 (90 BAS | 25 days supply | Qty: 9 | Fill #0

## 2019-11-08 NOTE — Telephone Encounter (Signed)
Pt called for a refill for albuterol / advised Pt that a refill was at the pharmacy / Pt asked to have Zelda's assistant to call her/ please advise

## 2019-11-09 ENCOUNTER — Other Ambulatory Visit: Payer: Self-pay | Admitting: Pharmacist

## 2019-11-09 MED ORDER — ALBUTEROL SULFATE HFA 108 (90 BASE) MCG/ACT IN AERS: 2.0000 | INHALATION_SPRAY | Freq: Four times a day (QID) | RESPIRATORY_TRACT | 2 refills | Status: AC | PRN

## 2019-11-09 MED ORDER — ALBUTEROL SULFATE HFA 108 (90 BASE) MCG/ACT IN AERS: INHALATION_SPRAY | RESPIRATORY_TRACT | 1 refills | Status: DC

## 2019-11-09 NOTE — Telephone Encounter (Signed)
Left message on voicemail to return call.  Rx refilled and sent to [facility] pharmacy.

## 2019-12-13 ENCOUNTER — Telehealth: Payer: Self-pay

## 2019-12-13 MED FILL — FERROUS SULFATE 325 MG TAB: 325 (65 FE) | 90 days supply | Qty: 90 | Fill #0

## 2019-12-13 MED FILL — FUROSEMIDE 20 MG TABS: 20 | 90 days supply | Qty: 90 | Fill #0

## 2019-12-13 NOTE — Telephone Encounter (Signed)
[name] from [name] called wanting to know the status of the requested letter of medical impairments from the patient PCP. Belinda Ramirez states this is for her Social Security Disability case. Belinda Ramirez stated his client would need the letter no later than Nov 8. A new copy of the letter was given to patient PCP.  Please advice patient if the letter can be made.

## 2019-12-13 NOTE — Telephone Encounter (Signed)
CMA notified PCP.

## 2019-12-18 ENCOUNTER — Other Ambulatory Visit: Payer: Self-pay | Admitting: Nurse Practitioner

## 2019-12-18 ENCOUNTER — Encounter: Payer: Self-pay | Admitting: Nurse Practitioner

## 2019-12-18 DIAGNOSIS — I10 Essential (primary) hypertension: Secondary | ICD-10-CM

## 2019-12-31 ENCOUNTER — Encounter: Payer: Self-pay | Admitting: General Practice

## 2020-01-02 ENCOUNTER — Ambulatory Visit: Payer: Medicaid Other | Admitting: Pharmacist

## 2020-01-04 ENCOUNTER — Telehealth: Payer: Self-pay | Admitting: Nurse Practitioner

## 2020-01-04 NOTE — Telephone Encounter (Signed)
Belinda Ramirez with [facility] calling to follow up on order for pt's incontinence supplies.  Pt needs  Under pads chucks for the bed Gloves Pull ons size 2X bariatric size  She has faxed several times.  Please call if you do not have order. [phone]

## 2020-01-07 NOTE — Telephone Encounter (Signed)
Received the orders. Will have PCP review and will fax once completed.

## 2020-01-07 NOTE — Telephone Encounter (Signed)
Belinda Ramirez with [facility] calling to follow up on order for pt's incontinence supplies.  Pt needs  Under pads chucks for the bed Gloves Pull ons size 2X bariatric size  She has faxed several times.  Please call if you do not have order.  [phone] [phone]

## 2020-01-15 NOTE — Telephone Encounter (Signed)
Katrina with [facility] calling to follow up on orders for this pt's supplies.  It has been several weeks.  Please advise.  Do you need to call her? [phone]

## 2020-01-17 NOTE — Telephone Encounter (Signed)
Spoke to an [facility] supplies agency and informed PCP receive the form. Will fax it once completed, and the completion are 7-14 business day.

## 2020-01-30 NOTE — Telephone Encounter (Signed)
Samantha with [employer] is calling checking on the status of incontinence supply form

## 2020-01-31 ENCOUNTER — Ambulatory Visit: Payer: Medicaid Other

## 2020-05-16 ENCOUNTER — Encounter (HOSPITAL_COMMUNITY): Payer: Self-pay

## 2020-05-16 ENCOUNTER — Other Ambulatory Visit: Payer: Self-pay

## 2020-05-16 ENCOUNTER — Emergency Department (HOSPITAL_COMMUNITY)
Admission: EM | Admit: 2020-05-16 | Discharge: 2020-05-17 | Disposition: A | Discharge status: Home / Self Care | Payer: Medicare HMO | Attending: Emergency Medicine | Admitting: Emergency Medicine

## 2020-05-16 ENCOUNTER — Ambulatory Visit (HOSPITAL_COMMUNITY)
Admission: EM | Admit: 2020-05-16 | Discharge: 2020-05-16 | Disposition: A | Discharge status: Home / Self Care | Payer: Medicare HMO | Attending: Medical Oncology | Admitting: Medical Oncology

## 2020-05-16 ENCOUNTER — Encounter (HOSPITAL_COMMUNITY): Payer: Self-pay | Admitting: Emergency Medicine

## 2020-05-16 ENCOUNTER — Emergency Department (HOSPITAL_COMMUNITY): Payer: Medicare HMO

## 2020-05-16 DIAGNOSIS — N183 Chronic kidney disease, stage 3 unspecified: Secondary | ICD-10-CM | POA: Diagnosis not present

## 2020-05-16 DIAGNOSIS — Z91040 Latex allergy status: Secondary | ICD-10-CM | POA: Insufficient documentation

## 2020-05-16 DIAGNOSIS — J810 Acute pulmonary edema: Secondary | ICD-10-CM | POA: Insufficient documentation

## 2020-05-16 DIAGNOSIS — R0602 Shortness of breath: Secondary | ICD-10-CM | POA: Diagnosis not present

## 2020-05-16 DIAGNOSIS — I129 Hypertensive chronic kidney disease with stage 1 through stage 4 chronic kidney disease, or unspecified chronic kidney disease: Secondary | ICD-10-CM | POA: Diagnosis not present

## 2020-05-16 DIAGNOSIS — R21 Rash and other nonspecific skin eruption: Secondary | ICD-10-CM | POA: Diagnosis not present

## 2020-05-16 DIAGNOSIS — J811 Chronic pulmonary edema: Secondary | ICD-10-CM | POA: Diagnosis not present

## 2020-05-16 DIAGNOSIS — I517 Cardiomegaly: Secondary | ICD-10-CM | POA: Diagnosis not present

## 2020-05-16 DIAGNOSIS — R079 Chest pain, unspecified: Secondary | ICD-10-CM

## 2020-05-16 DIAGNOSIS — I509 Heart failure, unspecified: Secondary | ICD-10-CM | POA: Diagnosis not present

## 2020-05-16 DIAGNOSIS — M549 Dorsalgia, unspecified: Secondary | ICD-10-CM | POA: Diagnosis not present

## 2020-05-16 DIAGNOSIS — I10 Essential (primary) hypertension: Secondary | ICD-10-CM | POA: Diagnosis not present

## 2020-05-16 DIAGNOSIS — I501 Left ventricular failure: Secondary | ICD-10-CM

## 2020-05-16 DIAGNOSIS — R9431 Abnormal electrocardiogram [ECG] [EKG]: Secondary | ICD-10-CM | POA: Diagnosis not present

## 2020-05-16 DIAGNOSIS — Z79899 Other long term (current) drug therapy: Secondary | ICD-10-CM | POA: Diagnosis not present

## 2020-05-16 DIAGNOSIS — I161 Hypertensive emergency: Secondary | ICD-10-CM | POA: Diagnosis not present

## 2020-05-16 DIAGNOSIS — L245 Irritant contact dermatitis due to other chemical products: Secondary | ICD-10-CM | POA: Diagnosis not present

## 2020-05-16 DIAGNOSIS — Z743 Need for continuous supervision: Secondary | ICD-10-CM | POA: Diagnosis not present

## 2020-05-16 LAB — CBC WITH DIFFERENTIAL/PLATELET
Abs Immature Granulocytes: 0.04 10*3/uL (ref 0.00–0.07)
Basophils Absolute: 0 10*3/uL (ref 0.0–0.1)
Basophils Relative: 0 %
Eosinophils Absolute: 0.1 10*3/uL (ref 0.0–0.5)
Eosinophils Relative: 1 %
HCT: 33.4 % — ABNORMAL LOW (ref 36.0–46.0)
Hemoglobin: 9.9 g/dL — ABNORMAL LOW (ref 12.0–15.0)
Immature Granulocytes: 1 %
Lymphocytes Relative: 22 %
Lymphs Abs: 1.6 10*3/uL (ref 0.7–4.0)
MCH: 20.6 pg — ABNORMAL LOW (ref 26.0–34.0)
MCHC: 29.6 g/dL — ABNORMAL LOW (ref 30.0–36.0)
MCV: 69.6 fL — ABNORMAL LOW (ref 80.0–100.0)
Monocytes Absolute: 0.5 10*3/uL (ref 0.1–1.0)
Monocytes Relative: 7 %
Neutro Abs: 5.3 10*3/uL (ref 1.7–7.7)
Neutrophils Relative %: 69 %
Platelets: 383 10*3/uL (ref 150–400)
RBC: 4.8 MIL/uL (ref 3.87–5.11)
RDW: 20.1 % — ABNORMAL HIGH (ref 11.5–15.5)
WBC: 7.6 10*3/uL (ref 4.0–10.5)
nRBC: 0 % (ref 0.0–0.2)

## 2020-05-16 LAB — COMPREHENSIVE METABOLIC PANEL
ALT: 13 U/L (ref 0–44)
AST: 16 U/L (ref 15–41)
Albumin: 3.5 g/dL (ref 3.5–5.0)
Alkaline Phosphatase: 88 U/L (ref 38–126)
Anion gap: 9 (ref 5–15)
BUN: 11 mg/dL (ref 6–20)
CO2: 26 mmol/L (ref 22–32)
Calcium: 9 mg/dL (ref 8.9–10.3)
Chloride: 107 mmol/L (ref 98–111)
Creatinine, Ser: 1.59 mg/dL — ABNORMAL HIGH (ref 0.44–1.00)
GFR, Estimated: 40 mL/min — ABNORMAL LOW (ref >60)
Glucose, Bld: 86 mg/dL (ref 70–99)
Potassium: 3.3 mmol/L — ABNORMAL LOW (ref 3.5–5.1)
Sodium: 142 mmol/L (ref 135–145)
Total Bilirubin: 0.8 mg/dL (ref 0.3–1.2)
Total Protein: 8.3 g/dL — ABNORMAL HIGH (ref 6.5–8.1)

## 2020-05-16 LAB — TROPONIN I (HIGH SENSITIVITY)
Troponin I (High Sensitivity): 37 ng/L — ABNORMAL HIGH (ref <18)
Troponin I (High Sensitivity): 37 ng/L — ABNORMAL HIGH (ref <18)

## 2020-05-16 NOTE — ED Notes (Signed)
EMS called, cardiac monitor requested.   Reported to ED charge nurse pt will go by EMS.

## 2020-05-16 NOTE — ED Triage Notes (Signed)
Pt presents with SOB and Rash on back xs 1-2 weeks.  States SOB can be normal for her but has gotten worse with exertion within the last 2 days.
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
## 2020-05-16 NOTE — ED Triage Notes (Addendum)
Pt arrives by EMS coming from Urgent care for EKG changes and SOB EMS states pt only went to Urgent care to get benadryl / back pain and Urgent care stated for her to come to the ED to be further evaluated    97 % RA

## 2020-05-16 NOTE — ED Notes (Signed)
Made triage RN Estanislado Spire as well as PA aware of Pt BP

## 2020-05-17 DIAGNOSIS — I10 Essential (primary) hypertension: Secondary | ICD-10-CM | POA: Diagnosis not present

## 2020-05-17 DIAGNOSIS — J810 Acute pulmonary edema: Secondary | ICD-10-CM | POA: Diagnosis not present

## 2020-05-17 DIAGNOSIS — L245 Irritant contact dermatitis due to other chemical products: Secondary | ICD-10-CM | POA: Diagnosis not present

## 2020-05-17 LAB — BRAIN NATRIURETIC PEPTIDE: B Natriuretic Peptide: 132.5 pg/mL — ABNORMAL HIGH (ref 0.0–100.0)

## 2020-05-17 MED ORDER — PREDNISONE 20 MG PO TABS
20.0000 mg | ORAL_TABLET | Freq: Every day | ORAL | 0 refills | Status: AC
  Filled 2020-05-20: qty 4, 4d supply, fill #0

## 2020-05-17 MED ORDER — ALBUTEROL SULFATE HFA 108 (90 BASE) MCG/ACT IN AERS
2.0000 | INHALATION_SPRAY | RESPIRATORY_TRACT | Status: DC | PRN
  Administered 2020-05-17: 2 via RESPIRATORY_TRACT
  Filled 2020-05-17: qty 6.7

## 2020-05-17 MED ORDER — LISINOPRIL 20 MG PO TABS
40.0000 mg | ORAL_TABLET | Freq: Once | ORAL | Status: AC
  Administered 2020-05-17: 40 mg via ORAL
  Filled 2020-05-17: qty 2

## 2020-05-17 MED ORDER — AMLODIPINE BESYLATE 5 MG PO TABS
10.0000 mg | ORAL_TABLET | Freq: Once | ORAL | Status: AC
  Administered 2020-05-17: 10 mg via ORAL
  Filled 2020-05-17: qty 2

## 2020-05-17 MED ORDER — FUROSEMIDE 20 MG PO TABS
20.0000 mg | ORAL_TABLET | Freq: Once | ORAL | Status: AC
  Administered 2020-05-17: 20 mg via ORAL
  Filled 2020-05-17: qty 1

## 2020-05-17 MED ORDER — DOXYCYCLINE HYCLATE 100 MG PO TABS
100.0000 mg | ORAL_TABLET | Freq: Once | ORAL | Status: AC
  Administered 2020-05-17: 100 mg via ORAL
  Filled 2020-05-17: qty 1

## 2020-05-17 MED ORDER — FUROSEMIDE 20 MG PO TABS
20.0000 mg | ORAL_TABLET | Freq: Every day | ORAL | 0 refills | Status: AC
  Filled 2020-05-20: qty 4, 4d supply, fill #0

## 2020-05-17 MED ORDER — DIPHENHYDRAMINE HCL 25 MG PO CAPS
25.0000 mg | ORAL_CAPSULE | Freq: Once | ORAL | Status: AC
  Administered 2020-05-17: 25 mg via ORAL
  Filled 2020-05-17: qty 1

## 2020-05-17 MED ORDER — DOXYCYCLINE HYCLATE 100 MG PO TABS
100.0000 mg | ORAL_TABLET | Freq: Two times a day (BID) | ORAL | 0 refills | Status: DC
  Filled 2020-05-20: qty 10, 5d supply, fill #0

## 2020-05-17 MED ORDER — PREDNISONE 20 MG PO TABS
20.0000 mg | ORAL_TABLET | Freq: Once | ORAL | Status: AC
  Administered 2020-05-17: 20 mg via ORAL
  Filled 2020-05-17: qty 1

## 2020-05-17 NOTE — Medical Student Note (Cosign Needed)
[facility] DEPT Provider Student Note For educational purposes for Medical, PA and NP students only and not part of the legal medical record.   CSN: [id number] Arrival date & time: 05/16/20  1653      History   Chief Complaint Chief Complaint  Patient presents with  . Abnormal ECG  . Back Pain    HPI Belinda Ramirez is a 45 y.o. female.  The history is provided by the patient. No language interpreter was used.  Rash Location: Back. Quality: burning and itchiness   Quality: not red   Quality comment:  Pustules Onset quality: Began yesterday morning. Timing:  Constant Chronicity:  New Context: chemical exposure   Context comment:  Sat in Lysol-cleaned chair with shirt on, two days ago Relieved by:  None tried Ineffective treatments:  None tried Associated symptoms: shortness of breath   Associated symptoms: no fever, no nausea, not vomiting and not wheezing   Shortness of Breath Severity: SOB after walking 15-20 feet, needs to stop. Conversational. Duration:  2 days Timing: While walking. Chronicity:  New Context: activity   Relieved by:  Rest Worsened by:  Exertion Ineffective treatments:  None tried Associated symptoms: chest pain and cough   Associated symptoms: no fever, no vomiting and no wheezing   Associated symptoms comment:  Pt endorses "a little" central chest pain which presents intermittently. Relieved by slowing her breathing and pressing her hand to her chest. No production with cough.   Past Medical History:  Diagnosis Date  . High cholesterol   . 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 Hypertension   . Obesity   . Sickle cell trait ([place])   . Stroke ([place])   . Urinary incontinence    For years. Resolved 5/19 spontaneously    Patient  Active Problem List   Diagnosis Date Noted  . Hypertensive crisis 06/27/2019  . Neurological abnormality 03/28/2018  . Hemiplegia and hemiparesis following cerebral infarction affecting left non-dominant side ([place]) 02/26/2018  . Globus pharyngeus 09/26/2017  . Left leg swelling 09/26/2017  . Blurred vision 08/17/2017  . History of CVA (cerebrovascular accident) 08/17/2017  . Urinary incontinence   . Severe hypertension 07/21/2017  . PRES (posterior reversible encephalopathy syndrome) 07/11/2017  . CKD (chronic kidney disease), stage III ([place]) 01/22/2017  . OSA (obstructive sleep apnea) 06/19/2015  . Morbid obesity due to excess calories ([place])   . Hyperlipidemia   . History of cerebral hemorrhage 04/16/2015    Past Surgical History:  Procedure Laterality Date  . TUBAL LIGATION    . WISDOM TOOTH EXTRACTION      OB History   No obstetric history on file.      Home Medications    Prior to Admission medications   Medication Sig Start Date End Date Taking? Authorizing Provider  albuterol (PROAIR HFA) 108 (90 Base) MCG/ACT inhaler Inhale 2 puffs into the lungs every 6 (six) hours as needed for wheezing or shortness of breath. 11/09/19  Yes Charlott Rakes, MD  amLODipine (NORVASC) 10 MG tablet Take 1 tablet (10 mg total) by mouth daily. 07/04/19 10/02/19 Yes Gildardo Pounds, NP  atorvastatin (LIPITOR) 80 MG tablet Take 1 tablet (80 mg total) by mouth daily at 6 PM. 07/04/19 10/02/19 Yes Gildardo Pounds, NP  cloNIDine (CATAPRES) 0.2 MG tablet Take  1 tablet (0.2 mg total) by mouth 3 (three) times daily. 06/30/19 08/29/19 Yes Hall, Carole N, DO  escitalopram (LEXAPRO) 20 MG tablet Take 1 tablet (20 mg total) by mouth daily. 07/04/19  Yes Gildardo Pounds, NP  FEROSUL 325 (65 Fe) MG tablet Take 325 mg by mouth daily. 12/20/19  Yes [provider]  furosemide (LASIX) 20 MG tablet Take 20 mg by mouth daily. 12/13/19  Yes [provider]  hydrALAZINE (APRESOLINE) 100 MG tablet  Take 100 mg by mouth 3 (three) times daily. 09/27/19  Yes [provider]  lisinopril (ZESTRIL) 40 MG tablet Take 1 tablet (40 mg total) by mouth daily. 07/04/19 10/02/19 Yes Gildardo Pounds, NP  potassium chloride SA (KLOR-CON) 20 MEQ tablet Take 20 mEq by mouth daily. 09/27/19  Yes [provider]  traZODone (DESYREL) 50 MG tablet Take 50-100 mg by mouth at bedtime as needed for sleep. 08/13/19  Yes [provider]  Accu-Chek FastClix Lancets MISC Use as instructed. Inject into the skin twice daily 06/30/19   Kayleen Memos, DO  acetaminophen (TYLENOL) 500 MG tablet Take 2 tablets (1,000 mg total) by mouth every 6 (six) hours as needed. Patient not taking: Reported on 05/17/2020 07/19/17   McDonald, Mia A, PA-C  Blood Glucose Calibration (ACCU-CHEK GUIDE CONTROL) LIQD 1 each by In Vitro route once as needed for up to 1 dose. 06/30/19   Kayleen Memos, DO  Blood Pressure Monitor DEVI Please provide patient with insurance approved blood pressure monitor 11/13/18   Gildardo Pounds, NP  fluticasone (FLONASE) 50 MCG/ACT nasal spray Place 2 sprays into both nostrils daily. Patient not taking: Reported on 05/17/2020 02/24/18   Gildardo Pounds, NP  glucose blood (ACCU-CHEK GUIDE) test strip Use as instructed. Check blood glucose by fingerstick twice per day. 06/30/19   Kayleen Memos, DO  Misc. Devices MISC Please provide patient with insurance approved thigh high compression stockings for swelling. 11/13/18   Gildardo Pounds, NP  Misc. Devices MISC Please provide patient with insurance approved adult diapers size 3XL for urinary incontinence. ICD-10 N39.46 01/04/19   Gildardo Pounds, NP  Misc. Devices MISC -Please provide patient with insurance approved:  CPAP with autopap 10-20. Medium ResMed AirFit F20 mask with heated humidity. DX G47.33 08/06/19   Gildardo Pounds, NP    Family History Family History  Problem Relation Age of Onset  . Heart attack Maternal Grandfather   . Diabetes  Mother   . Hypertension Mother   . Cancer Father   . Heart disease Maternal Grandmother   . Diabetes Sister   . Diabetes Brother   . Breast cancer Maternal Aunt     Social History Social History   Tobacco Use  . Smoking status: Never Smoker  . Smokeless tobacco: Never Used  Vaping Use  . Vaping Use: Never used  Substance Use Topics  . Alcohol use: Yes    Alcohol/week: 0.0 standard drinks    Comment: occaisonal  . Drug use: No     Allergies   Oxycodone, Percocet [oxycodone-acetaminophen], Amoxicillin, Aspirin, Latex, and Tape   Review of Systems Review of Systems  Constitutional: Negative for appetite change, chills and fever.  Respiratory: Positive for cough and shortness of breath. Negative for wheezing.   Cardiovascular: Positive for chest pain.       Intermittent chest pain, relieved by slowing breathing and pressing hand to chest.  Gastrointestinal: Negative for blood in stool, nausea and vomiting.  Genitourinary:  Negative for vaginal bleeding.     Physical Exam Updated Vital Signs BP 134/65   Pulse 69   Temp 98.9 F (37.2 C) (Oral)   Resp (!) 22   LMP 05/08/2020   SpO2 99%   Physical Exam Constitutional:      General: She is not in acute distress.    Appearance: Normal appearance. She is obese. She is not ill-appearing, toxic-appearing or diaphoretic.  Cardiovascular:     Rate and Rhythm: Normal rate and regular rhythm.     Pulses: Normal pulses.     Heart sounds: Normal heart sounds. No murmur heard.   Pulmonary:     Effort: Pulmonary effort is normal. No respiratory distress.     Breath sounds: No stridor. No wheezing, rhonchi or rales.  Skin:    General: Skin is warm and dry.     Coloration: Skin is not pale.     Findings: Rash present. No bruising, burn, ecchymosis, erythema, signs of injury, laceration, petechiae or wound. Rash is purpuric and pustular. Rash is not vesicular.     Comments: Rash is pustular and distributed across bilateral  back.   Neurological:     Mental Status: She is alert and oriented to person, place, and time.  Psychiatric:        Mood and Affect: Mood normal.        Behavior: Behavior normal.      ED Treatments / Results  Labs (all labs ordered are listed, but only abnormal results are displayed) Labs Reviewed  COMPREHENSIVE METABOLIC PANEL - Abnormal; Notable for the following components:      Result Value   Potassium 3.3 (*)    Creatinine, Ser 1.59 (*)    Total Protein 8.3 (*)    GFR, Estimated 40 (*)    All other components within normal limits  CBC WITH DIFFERENTIAL/PLATELET - Abnormal; Notable for the following components:   Hemoglobin 9.9 (*)    HCT 33.4 (*)    MCV 69.6 (*)    MCH 20.6 (*)    MCHC 29.6 (*)    RDW 20.1 (*)    All other components within normal limits  BRAIN NATRIURETIC PEPTIDE - Abnormal; Notable for the following components:   B Natriuretic Peptide 132.5 (*)    All other components within normal limits  TROPONIN I (HIGH SENSITIVITY) - Abnormal; Notable for the following components:   Troponin I (High Sensitivity) 37 (*)    All other components within normal limits  TROPONIN I (HIGH SENSITIVITY) - Abnormal; Notable for the following components:   Troponin I (High Sensitivity) 37 (*)    All other components within normal limits    EKG  Radiology DG Chest 2 View  Result Date: 05/16/2020 CLINICAL DATA:  Shortness of breath, rash from cleaning solution, abnormal EKG EXAM: CHEST - 2 VIEW COMPARISON:  Radiograph 11/10/2016 FINDINGS: Bandlike opacities are present in the lung bases, much of which could reflect subsegmental atelectasis and/or scarring. Additional hazy mid to lower lung predominant opacity with slight pulmonary vascular congestion could reflect mild edema in the appropriate clinical setting. No pneumothorax. No effusion. Cardiomegaly is similar to priors. No other acute osseous or soft tissue abnormality. Degenerative changes are present in the imaged  spine and shoulders. IMPRESSION: Bandlike opacities in the lungs likely reflecting subsegmental atelectasis or scarring. Additional hazy mid to lower lung predominant opacity with pulmonary vascular congestion and cardiomegaly, findings may reflect CHF with mild edema in the appropriate clinical setting. Electronically Signed  By: Lovena Le M.D.   On: 05/16/2020 17:41    Procedures Procedures (including critical care time)  Medications Ordered in ED Medications  furosemide (LASIX) tablet 20 mg (has no administration in time range)  doxycycline (VIBRA-TABS) tablet 100 mg (has no administration in time range)  predniSONE (DELTASONE) tablet 20 mg (has no administration in time range)  lisinopril (ZESTRIL) tablet 40 mg (40 mg Oral Given 05/17/20 0834)  amLODipine (NORVASC) tablet 10 mg (10 mg Oral Given 05/17/20 0834)  diphenhydrAMINE (BENADRYL) capsule 25 mg (25 mg Oral Given 05/17/20 0834)     Initial Impression / Assessment and Plan / ED Course  I have reviewed the triage vital signs and the nursing notes.  Pertinent labs & imaging results that were available during my care of the patient were reviewed by me and considered in my medical decision making (see chart for details).     Back Rash: Back rash is bilateral, pustular, itchy, and burning. Timeline of presentation consistent with off-brand Lysol contact dermatitis. No erythema or fevers. Ordered Benadryl. Will order PO Prednisone for 4 days (no hx of DM) and Doxycycline given pustular nature.  Shortness of Breath: Pt has had SOB after walking 15-20 steps for the last two days. No fever or sick contacts. This is a new problem for her. No hx of asthma, but has chronic cough symptoms, worse in cold weather. Does endorse small amount of intermittent chest pain, relieved by "pressing down on chest." Pt's parents both have CHF, and pt's PA & Lateral CXRs and EKG reveal some concern of CHF with enlarged cardiac silhouette and mild evidence of  LVH respectively. No leg edema appreciable, no diuretics being taken. Checked BNP which was stable at 132 compared to last year, Troponin 1 (high sens.) was elevated at 37 however remained stable on recheck. Ambulated well with SpO2 at 100% on RA.  Some new anemia since last year, without stool abnormalities or worsened menstrual bleeding, however would not expect Hgb of 9.9 to be etiology of SOB in last two days. Ordering '20mg'$  Lasix for 4 days. Encouraged pt to follow up next week with PCP Dr. Geryl Rankins for ED follow up.   Final Clinical Impressions(s) / ED Diagnoses   Final diagnoses:  None    New Prescriptions New Prescriptions   No medications on file

## 2020-05-17 NOTE — ED Notes (Signed)
Patient walk to bathroom with pulse ox and her 02 was 100% while walking back from bathroom.

## 2020-05-17 NOTE — ED Provider Notes (Signed)
[facility] EMERGENCY DEPARTMENT Provider Note   CSN: [id number] Arrival date & time: 05/16/20  1653     History Chief Complaint  Patient presents with  . Abnormal ECG  . Back Pain    Belinda Ramirez is a 45 y.o. female.  Patient is a 45 year old female with a history of stroke, hypertension, CKD, hyperlipidemia, PRES who is presenting today from urgent care for several reasons.  Patient reports 2 weeks ago she had cleaned her recliner with a disinfectant and sat on it while it was still wet.  The next day she started noticing an itching burning rash involving her back which has been persistent and gradually getting worse.  She has not tried taking anything for the rash and went to urgent care yesterday because it was not improving.  However while she was there she also mentioned she been having shortness of breath over the last 2 weeks but more pronounced in the last 2 days.  She has not had new cough, congestion, fever but did have some mild chest pain yesterday that she described as a tightness that was improved when she put pressure over her chest and slowed her breathing.  She has noted in the last 2 days she cannot walk more than approximately 20 feet until she feels like she has to stop and catch her breath.  She has had some intermittent discomfort in her left leg but has not noticed any swelling.  She denies any known weight gain or abdominal distention.  She has been compliant with her hypertensive medications and last took them yesterday morning.  She does not take any diuretics and denies any urinary symptoms.  She has not had any abdominal pain, nausea or vomiting.  She has been eating appropriately and has had no dietary changes.  She denies any wheezing or need to use an inhaler.  She denies any black or bloody stools. Urgent care evaluated the patient and because of the complaint of 2 weeks of gradually worsening dyspnea on exertion, nonspecific chest pain and an  EKG that showed changes they sent her to the emergency room for further evaluation.  Unfortunately the patient has waited in the waiting room for approximately 15 hours.  She denies any chest pain or shortness of breath at this time.  She is still having significant itching in her back.  The history is provided by the patient.  Back Pain      Past Medical History:  Diagnosis Date  . High cholesterol   . 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 Hypertension   . Obesity   . Sickle cell trait ([place])   . Stroke ([place])   . Urinary incontinence    For years. Resolved 5/19 spontaneously    Patient Active Problem List   Diagnosis Date Noted  . Hypertensive crisis 06/27/2019  . Neurological abnormality 03/28/2018  . Hemiplegia and hemiparesis following cerebral infarction affecting left non-dominant side ([place]) 02/26/2018  . Globus pharyngeus 09/26/2017  . Left leg swelling 09/26/2017  . Blurred vision 08/17/2017  . History of CVA (cerebrovascular accident) 08/17/2017  . Urinary incontinence   . Severe hypertension 07/21/2017  . PRES (posterior reversible encephalopathy syndrome) 07/11/2017  . CKD (chronic kidney disease), stage III ([place]) 01/22/2017  . OSA (obstructive sleep apnea) 06/19/2015  .  Morbid obesity due to excess calories ([place])   . Hyperlipidemia   . History of cerebral hemorrhage 04/16/2015    Past Surgical History:  Procedure Laterality Date  . TUBAL LIGATION    . WISDOM TOOTH EXTRACTION       OB History   No obstetric history on file.     Family History  Problem Relation Age of Onset  . Heart attack Maternal Grandfather   . Diabetes Mother   . Hypertension Mother   . Cancer Father   . Heart disease Maternal Grandmother   . Diabetes Sister   . Diabetes Brother    . Breast cancer Maternal Aunt     Social History   Tobacco Use  . Smoking status: Never Smoker  . Smokeless tobacco: Never Used  Vaping Use  . Vaping Use: Never used  Substance Use Topics  . Alcohol use: Yes    Alcohol/week: 0.0 standard drinks    Comment: occaisonal  . Drug use: No    Home Medications Prior to Admission medications   Medication Sig Start Date End Date Taking? Authorizing Provider  albuterol (PROAIR HFA) 108 (90 Base) MCG/ACT inhaler Inhale 2 puffs into the lungs every 6 (six) hours as needed for wheezing or shortness of breath. 11/09/19  Yes Charlott Rakes, MD  amLODipine (NORVASC) 10 MG tablet Take 1 tablet (10 mg total) by mouth daily. 07/04/19 10/02/19 Yes Gildardo Pounds, NP  atorvastatin (LIPITOR) 80 MG tablet Take 1 tablet (80 mg total) by mouth daily at 6 PM. 07/04/19 10/02/19 Yes Gildardo Pounds, NP  cloNIDine (CATAPRES) 0.2 MG tablet Take 1 tablet (0.2 mg total) by mouth 3 (three) times daily. 06/30/19 08/29/19 Yes Kayleen Memos, DO  doxycycline (VIBRAMYCIN) 100 MG capsule Take 1 capsule (100 mg total) by mouth 2 (two) times daily. 05/17/20  Yes Taylyn Brame, Loree Fee, MD  escitalopram (LEXAPRO) 20 MG tablet Take 1 tablet (20 mg total) by mouth daily. 07/04/19  Yes Gildardo Pounds, NP  FEROSUL 325 (65 Fe) MG tablet Take 325 mg by mouth daily. 12/20/19  Yes [provider]  furosemide (LASIX) 20 MG tablet Take 1 tablet (20 mg total) by mouth daily. 05/17/20  Yes Blanchie Dessert, MD  hydrALAZINE (APRESOLINE) 100 MG tablet Take 100 mg by mouth 3 (three) times daily. 09/27/19  Yes [provider]  lisinopril (ZESTRIL) 40 MG tablet Take 1 tablet (40 mg total) by mouth daily. 07/04/19 10/02/19 Yes Gildardo Pounds, NP  potassium chloride SA (KLOR-CON) 20 MEQ tablet Take 20 mEq by mouth daily. 09/27/19  Yes [provider]  predniSONE (DELTASONE) 20 MG tablet Take 1 tablet (20 mg total) by mouth daily. 05/17/20  Yes Blanchie Dessert, MD  traZODone  (DESYREL) 50 MG tablet Take 50-100 mg by mouth at bedtime as needed for sleep. 08/13/19  Yes [provider]  Accu-Chek FastClix Lancets MISC Use as instructed. Inject into the skin twice daily 06/30/19   Kayleen Memos, DO  acetaminophen (TYLENOL) 500 MG tablet Take 2 tablets (1,000 mg total) by mouth every 6 (six) hours as needed. Patient not taking: Reported on 05/17/2020 07/19/17   McDonald, Mia A, PA-C  Blood Glucose Calibration (ACCU-CHEK GUIDE CONTROL) LIQD 1 each by In Vitro route once as needed for up to 1 dose. 06/30/19   Kayleen Memos, DO  Blood Pressure Monitor DEVI Please provide patient with insurance approved blood pressure monitor 11/13/18   Gildardo Pounds, NP  fluticasone (FLONASE) 50 MCG/ACT nasal spray  Place 2 sprays into both nostrils daily. Patient not taking: Reported on 05/17/2020 02/24/18   Gildardo Pounds, NP  glucose blood (ACCU-CHEK GUIDE) test strip Use as instructed. Check blood glucose by fingerstick twice per day. 06/30/19   Kayleen Memos, DO  Misc. Devices MISC Please provide patient with insurance approved thigh high compression stockings for swelling. 11/13/18   Gildardo Pounds, NP  Misc. Devices MISC Please provide patient with insurance approved adult diapers size 3XL for urinary incontinence. ICD-10 N39.46 01/04/19   Gildardo Pounds, NP  Misc. Devices MISC -Please provide patient with insurance approved:  CPAP with autopap 10-20. Medium ResMed AirFit F20 mask with heated humidity. DX G47.33 08/06/19   Gildardo Pounds, NP    Allergies    Oxycodone, Percocet [oxycodone-acetaminophen], Amoxicillin, Aspirin, Latex, and Tape  Review of Systems   Review of Systems  Musculoskeletal: Positive for back pain.  All other systems reviewed and are negative.   Physical Exam Updated Vital Signs BP (!) 181/103 (BP Location: Right Arm) Comment (BP Location): lower  Pulse 78   Temp 98.7 F (37.1 C) (Oral)   Resp (!) 21   LMP 05/08/2020   SpO2 100%   Physical  Exam Vitals and nursing note reviewed.  Constitutional:      General: She is not in acute distress.    Appearance: Normal appearance. She is well-developed.  HENT:     Head: Normocephalic and atraumatic.     Nose: Nose normal.  Eyes:     Conjunctiva/sclera: Conjunctivae normal.     Pupils: Pupils are equal, round, and reactive to light.  Cardiovascular:     Rate and Rhythm: Normal rate and regular rhythm.     Pulses: Normal pulses.     Heart sounds: No murmur heard.   Pulmonary:     Effort: Pulmonary effort is normal. No respiratory distress.     Breath sounds: Normal breath sounds. No wheezing or rales.     Comments: Able to speak in full sentences Chest:     Chest wall: No tenderness.  Abdominal:     General: There is no distension.     Palpations: Abdomen is soft.     Tenderness: There is no abdominal tenderness. There is no guarding or rebound.  Musculoskeletal:        General: No tenderness. Normal range of motion.     Cervical back: Normal range of motion and neck supple.     Right lower leg: No edema.     Left lower leg: No edema.     Comments: No calf tenderness bilaterally  Skin:    General: Skin is warm and dry.     Findings: Rash present. No erythema.       Neurological:     Mental Status: She is alert and oriented to person, place, and time. Mental status is at baseline.     Sensory: No sensory deficit.     Motor: No weakness.  Psychiatric:        Mood and Affect: Mood normal.        Behavior: Behavior normal.        Thought Content: Thought content normal.     ED Results / Procedures / Treatments   Labs (all labs ordered are listed, but only abnormal results are displayed) Labs Reviewed  COMPREHENSIVE METABOLIC PANEL - Abnormal; Notable for the following components:      Result Value   Potassium 3.3 (*)    Creatinine, Ser  1.59 (*)    Total Protein 8.3 (*)    GFR, Estimated 40 (*)    All other components within normal limits  CBC WITH  DIFFERENTIAL/PLATELET - Abnormal; Notable for the following components:   Hemoglobin 9.9 (*)    HCT 33.4 (*)    MCV 69.6 (*)    MCH 20.6 (*)    MCHC 29.6 (*)    RDW 20.1 (*)    All other components within normal limits  BRAIN NATRIURETIC PEPTIDE - Abnormal; Notable for the following components:   B Natriuretic Peptide 132.5 (*)    All other components within normal limits  TROPONIN I (HIGH SENSITIVITY) - Abnormal; Notable for the following components:   Troponin I (High Sensitivity) 37 (*)    All other components within normal limits  TROPONIN I (HIGH SENSITIVITY) - Abnormal; Notable for the following components:   Troponin I (High Sensitivity) 37 (*)    All other components within normal limits    EKG EKG Interpretation  Date/Time:  Friday May 16 2020 16:59:08 EDT Ventricular Rate:  82 PR Interval:  198 QRS Duration: 122 QT Interval:  416 QTC Calculation: 486 R Axis:   -5 Text Interpretation: Normal sinus rhythm Non-specific intra-ventricular conduction delay Minimal voltage criteria for LVH, may be normal variant ( Cornell product ) new T wave abnormality, consider lateral ischemia compared to EKG from 5/21 Confirmed by Blanchie Dessert [phone]) on 05/17/2020 10:12:23 AM   Radiology DG Chest 2 View  Result Date: 05/16/2020 CLINICAL DATA:  Shortness of breath, rash from cleaning solution, abnormal EKG EXAM: CHEST - 2 VIEW COMPARISON:  Radiograph 11/10/2016 FINDINGS: Bandlike opacities are present in the lung bases, much of which could reflect subsegmental atelectasis and/or scarring. Additional hazy mid to lower lung predominant opacity with slight pulmonary vascular congestion could reflect mild edema in the appropriate clinical setting. No pneumothorax. No effusion. Cardiomegaly is similar to priors. No other acute osseous or soft tissue abnormality. Degenerative changes are present in the imaged spine and shoulders. IMPRESSION: Bandlike opacities in the lungs likely reflecting  subsegmental atelectasis or scarring. Additional hazy mid to lower lung predominant opacity with pulmonary vascular congestion and cardiomegaly, findings may reflect CHF with mild edema in the appropriate clinical setting. Electronically Signed   By: Lovena Le M.D.   On: 05/16/2020 17:41    Procedures Procedures   Medications Ordered in ED Medications  albuterol (VENTOLIN HFA) 108 (90 Base) MCG/ACT inhaler 2 puff (2 puffs Inhalation Given 05/17/20 1203)  lisinopril (ZESTRIL) tablet 40 mg (40 mg Oral Given 05/17/20 0834)  amLODipine (NORVASC) tablet 10 mg (10 mg Oral Given 05/17/20 0834)  diphenhydrAMINE (BENADRYL) capsule 25 mg (25 mg Oral Given 05/17/20 0834)  furosemide (LASIX) tablet 20 mg (20 mg Oral Given 05/17/20 1140)  doxycycline (VIBRA-TABS) tablet 100 mg (100 mg Oral Given 05/17/20 1140)  predniSONE (DELTASONE) tablet 20 mg (20 mg Oral Given 05/17/20 1140)    ED Course  I have reviewed the triage vital signs and the nursing notes.  Pertinent labs & imaging results that were available during my care of the patient were reviewed by me and considered in my medical decision making (see chart for details).    MDM Rules/Calculators/A&P                          Patient presenting today with 2 separate complaints.  Initially is for itching burning rash on her back.  This is most likely related  to a contact dermatitis after she sat in a chair that had been cleaned with a chemical cleaner.  However there are some pustules and concern for possible underlying infection on top of a contact dermatitis.  No evidence of shingles at this time, abscess or deep infection.  Will treat with short course of prednisone and doxycycline.  Benadryl did help with the itching. Secondly patient has had 2 weeks of worsening shortness of breath but more pronounced in the last 2 days.  She has a history of severe hypertension that she takes 4 different medications for and still reports some difficulty with blood pressure  regulation and control.  She denies any headache or unilateral numbness or weakness today.  She does admit to the shortness of breath and did have some chest tightness early yesterday but denies any chest pain currently.  Sats are 100% on room air and she is in no acute distress.  She has no wheezing and low concern for asthma, pneumonia or bronchitis.  Patient given her prior history, chronic kidney disease concern for CHF given her dyspnea on exertion.  Renal function today is at baseline with a creatinine of 1.5, BNP is unchanged at 135.  Patient had an echo approximately 1 year ago in May 21 that showed an EF of 65% with grade 2 diastolic dysfunction.  Troponins are flat at 37 and episode of pain was now 24 hours ago with low suspicion for acute ACS.  Patient does have some new T wave inversion laterally from 1 year ago but unclear if this is related to LVH.  Patient's chest x-ray with signs of fluid overload.  Patient was able to stand and ambulate here with no drop in saturation or development of chest pain.  Will treat patient with Lasix 20 mg for the next 5 days.  Encourage close follow-up with her PCP and given return precautions.  At this time low suspicion for PE as patient has no calf tenderness, lower extremity edema, she takes no beta-blockers and is PERC negative.  Do not feel this needs to be explored further at this time. Final Clinical Impression(s) / ED Diagnoses Final diagnoses:  Pulmonary edema cardiac cause ([place])  Irritant contact dermatitis due to other chemical products    Rx / DC Orders ED Discharge Orders         Ordered    furosemide (LASIX) 20 MG tablet  Daily        05/17/20 1115    predniSONE (DELTASONE) 20 MG tablet  Daily        05/17/20 1115    doxycycline (VIBRAMYCIN) 100 MG capsule  2 times daily        05/17/20 1115           Blanchie Dessert, MD 05/17/20 1453

## 2020-05-17 NOTE — Discharge Instructions (Signed)
So it appears that you are having an allergic reaction on your back probably from what your skin came in contact with however there are some areas that look like they have some pus so we will give you an antibiotic for that but also have given you a prescription for a steroid that will hopefully help the reaction and the itching.  Secondly your lab work today did show that you had some new anemia from last year that your doctor will need to follow-up on but also your x-ray showed that there may be some fluid in your lungs which we are thinking is the cause for you feeling short of breath over the last few days.  We have given you a prescription for a diuretic that you will take 1 time a day for the next 4 days which will make you pee a lot.  If this does not help your symptoms or your shortness of breath gets worse despite taking the medication you need to return to the emergency room.

## 2020-05-20 ENCOUNTER — Other Ambulatory Visit: Payer: Self-pay

## 2020-07-09 ENCOUNTER — Emergency Department (HOSPITAL_COMMUNITY)
Admission: EM | Admit: 2020-07-09 | Discharge: 2020-07-09 | Disposition: A | Discharge status: Home / Self Care | Payer: Medicare HMO | Attending: Emergency Medicine | Admitting: Emergency Medicine

## 2020-07-09 ENCOUNTER — Other Ambulatory Visit: Payer: Self-pay

## 2020-07-09 ENCOUNTER — Encounter (HOSPITAL_COMMUNITY): Payer: Self-pay

## 2020-07-09 DIAGNOSIS — Z5321 Procedure and treatment not carried out due to patient leaving prior to being seen by health care provider: Secondary | ICD-10-CM | POA: Insufficient documentation

## 2020-07-09 DIAGNOSIS — E785 Hyperlipidemia, unspecified: Secondary | ICD-10-CM | POA: Diagnosis not present

## 2020-07-09 DIAGNOSIS — J40 Bronchitis, not specified as acute or chronic: Secondary | ICD-10-CM | POA: Diagnosis not present

## 2020-07-09 DIAGNOSIS — R69 Illness, unspecified: Secondary | ICD-10-CM | POA: Diagnosis not present

## 2020-07-09 DIAGNOSIS — G4700 Insomnia, unspecified: Secondary | ICD-10-CM | POA: Diagnosis not present

## 2020-07-09 DIAGNOSIS — Z79899 Other long term (current) drug therapy: Secondary | ICD-10-CM | POA: Insufficient documentation

## 2020-07-09 DIAGNOSIS — I69351 Hemiplegia and hemiparesis following cerebral infarction affecting right dominant side: Secondary | ICD-10-CM | POA: Diagnosis not present

## 2020-07-09 DIAGNOSIS — I10 Essential (primary) hypertension: Secondary | ICD-10-CM | POA: Insufficient documentation

## 2020-07-09 DIAGNOSIS — R030 Elevated blood-pressure reading, without diagnosis of hypertension: Secondary | ICD-10-CM

## 2020-07-09 DIAGNOSIS — G4730 Sleep apnea, unspecified: Secondary | ICD-10-CM | POA: Diagnosis not present

## 2020-07-09 DIAGNOSIS — I739 Peripheral vascular disease, unspecified: Secondary | ICD-10-CM | POA: Diagnosis not present

## 2020-07-09 DIAGNOSIS — R269 Unspecified abnormalities of gait and mobility: Secondary | ICD-10-CM | POA: Diagnosis not present

## 2020-07-09 LAB — I-STAT BETA HCG BLOOD, ED (MC, WL, AP ONLY): I-stat hCG, quantitative: <5 m[IU]/mL (ref <5)

## 2020-07-09 LAB — I-STAT CHEM 8, ED
BUN: 14 mg/dL (ref 6–20)
Calcium, Ion: 1.12 mmol/L — ABNORMAL LOW (ref 1.15–1.40)
Chloride: 106 mmol/L (ref 98–111)
Creatinine, Ser: 1.3 mg/dL — ABNORMAL HIGH (ref 0.44–1.00)
Glucose, Bld: 72 mg/dL (ref 70–99)
HCT: 36 % (ref 36.0–46.0)
Hemoglobin: 12.2 g/dL (ref 12.0–15.0)
Potassium: 3.7 mmol/L (ref 3.5–5.1)
Sodium: 143 mmol/L (ref 135–145)
TCO2: 28 mmol/L (ref 22–32)

## 2020-07-09 NOTE — ED Provider Notes (Signed)
Emergency Medicine Provider Triage Evaluation Note  Belinda Ramirez , a 45 y.o. female  was evaluated in triage.  Pt complains of htn. pts physician evaluated her today for a regular visit and her bp was elevated over 200. She did not take her meds today. Denies headache, chest pain, shortness of breath. She states her blood pressure is always high like this.   Review of Systems  Positive: n/a Negative: Headache, chest pain, shortness of breath  Physical Exam  BP (!) 230/135 (BP Location: Right Arm)   Pulse 73   Temp 98.6 F (37 C) (Oral)   Resp 20   SpO2 99%  Gen:   Awake, no distress Resp:  Normal effort  MSK:   Moves extremities without difficulty  Other:  Clear speech, no facial droop, no focal deficits except for chronic lle weakness from prior cva which is unchanged  Medical Decision Making  Medically screening exam initiated at 5:27 PM.  Appropriate orders placed.  Belinda Ramirez was informed that the remainder of the evaluation will be completed by another provider, this initial triage assessment does not replace that evaluation, and the importance of remaining in the ED until their evaluation is complete.     Bishop Dublin 07/09/20 1732    Lacretia Leigh, MD 07/11/20 [phone]

## 2020-07-09 NOTE — ED Triage Notes (Signed)
Patient sent by her MD for elevated BP. Patient has not taken her BP medication today. Denies headache, no complaints. Here for BP management only. Alert and oriented

## 2020-07-09 NOTE — ED Notes (Signed)
Pt refused to be taken back to their room, states "I'll just take my medicine and go home"

## 2020-07-30 ENCOUNTER — Emergency Department (HOSPITAL_COMMUNITY)
Admission: EM | Admit: 2020-07-30 | Discharge: 2020-07-30 | Disposition: A | Discharge status: Home / Self Care | Payer: Medicare HMO | Attending: Emergency Medicine | Admitting: Emergency Medicine

## 2020-07-30 ENCOUNTER — Ambulatory Visit: Payer: Medicare HMO | Admitting: Physician Assistant

## 2020-07-30 ENCOUNTER — Emergency Department (HOSPITAL_COMMUNITY): Payer: Medicare HMO

## 2020-07-30 ENCOUNTER — Ambulatory Visit: Payer: Self-pay

## 2020-07-30 ENCOUNTER — Encounter (HOSPITAL_COMMUNITY): Payer: Self-pay | Admitting: Pharmacy Technician

## 2020-07-30 ENCOUNTER — Other Ambulatory Visit: Payer: Self-pay

## 2020-07-30 DIAGNOSIS — I10 Essential (primary) hypertension: Secondary | ICD-10-CM

## 2020-07-30 DIAGNOSIS — Z8673 Personal history of transient ischemic attack (TIA), and cerebral infarction without residual deficits: Secondary | ICD-10-CM | POA: Insufficient documentation

## 2020-07-30 DIAGNOSIS — G9389 Other specified disorders of brain: Secondary | ICD-10-CM | POA: Diagnosis not present

## 2020-07-30 DIAGNOSIS — N183 Chronic kidney disease, stage 3 unspecified: Secondary | ICD-10-CM | POA: Diagnosis not present

## 2020-07-30 DIAGNOSIS — I129 Hypertensive chronic kidney disease with stage 1 through stage 4 chronic kidney disease, or unspecified chronic kidney disease: Secondary | ICD-10-CM | POA: Diagnosis not present

## 2020-07-30 DIAGNOSIS — R079 Chest pain, unspecified: Secondary | ICD-10-CM

## 2020-07-30 DIAGNOSIS — R0602 Shortness of breath: Secondary | ICD-10-CM | POA: Diagnosis not present

## 2020-07-30 DIAGNOSIS — Z91040 Latex allergy status: Secondary | ICD-10-CM | POA: Insufficient documentation

## 2020-07-30 DIAGNOSIS — Z79899 Other long term (current) drug therapy: Secondary | ICD-10-CM | POA: Insufficient documentation

## 2020-07-30 DIAGNOSIS — H538 Other visual disturbances: Secondary | ICD-10-CM | POA: Insufficient documentation

## 2020-07-30 DIAGNOSIS — R0789 Other chest pain: Secondary | ICD-10-CM | POA: Diagnosis not present

## 2020-07-30 DIAGNOSIS — Z20822 Contact with and (suspected) exposure to covid-19: Secondary | ICD-10-CM | POA: Diagnosis not present

## 2020-07-30 DIAGNOSIS — I639 Cerebral infarction, unspecified: Secondary | ICD-10-CM | POA: Diagnosis not present

## 2020-07-30 LAB — CBC WITH DIFFERENTIAL/PLATELET
Abs Immature Granulocytes: 0 10*3/uL (ref 0.00–0.07)
Basophils Absolute: 0 10*3/uL (ref 0.0–0.1)
Basophils Relative: 0 %
Eosinophils Absolute: 0.1 10*3/uL (ref 0.0–0.5)
Eosinophils Relative: 1 %
HCT: 32 % — ABNORMAL LOW (ref 36.0–46.0)
Hemoglobin: 9.4 g/dL — ABNORMAL LOW (ref 12.0–15.0)
Lymphocytes Relative: 20 %
Lymphs Abs: 1.5 10*3/uL (ref 0.7–4.0)
MCH: 19.5 pg — ABNORMAL LOW (ref 26.0–34.0)
MCHC: 29.4 g/dL — ABNORMAL LOW (ref 30.0–36.0)
MCV: 66.3 fL — ABNORMAL LOW (ref 80.0–100.0)
Monocytes Absolute: 0.1 10*3/uL (ref 0.1–1.0)
Monocytes Relative: 1 %
Neutro Abs: 5.8 10*3/uL (ref 1.7–7.7)
Neutrophils Relative %: 78 %
Platelets: 349 10*3/uL (ref 150–400)
RBC: 4.83 MIL/uL (ref 3.87–5.11)
RDW: 20.7 % — ABNORMAL HIGH (ref 11.5–15.5)
WBC: 7.4 10*3/uL (ref 4.0–10.5)
nRBC: 0 % (ref 0.0–0.2)
nRBC: 0 /100 WBC

## 2020-07-30 LAB — BASIC METABOLIC PANEL
Anion gap: 7 (ref 5–15)
BUN: 12 mg/dL (ref 6–20)
CO2: 25 mmol/L (ref 22–32)
Calcium: 8.4 mg/dL — ABNORMAL LOW (ref 8.9–10.3)
Chloride: 105 mmol/L (ref 98–111)
Creatinine, Ser: 1.45 mg/dL — ABNORMAL HIGH (ref 0.44–1.00)
GFR, Estimated: 45 mL/min — ABNORMAL LOW (ref >60)
Glucose, Bld: 99 mg/dL (ref 70–99)
Potassium: 3.6 mmol/L (ref 3.5–5.1)
Sodium: 137 mmol/L (ref 135–145)

## 2020-07-30 LAB — URINALYSIS, ROUTINE W REFLEX MICROSCOPIC
Bilirubin Urine: NEGATIVE
Glucose, UA: NEGATIVE mg/dL
Ketones, ur: NEGATIVE mg/dL
Nitrite: NEGATIVE
Protein, ur: 100 mg/dL — AB
Specific Gravity, Urine: 1.01 (ref 1.005–1.030)
Squamous Epithelial / HPF: >50 — ABNORMAL HIGH (ref 0–5)
WBC, UA: >50 WBC/hpf — ABNORMAL HIGH (ref 0–5)
pH: 6 (ref 5.0–8.0)

## 2020-07-30 LAB — TROPONIN I (HIGH SENSITIVITY)
Troponin I (High Sensitivity): 30 ng/L — ABNORMAL HIGH (ref <18)
Troponin I (High Sensitivity): 41 ng/L — ABNORMAL HIGH (ref <18)

## 2020-07-30 LAB — RESP PANEL BY RT-PCR (FLU A&B, COVID) ARPGX2
Influenza A by PCR: NEGATIVE
Influenza B by PCR: NEGATIVE
SARS Coronavirus 2 by RT PCR: NEGATIVE

## 2020-07-30 LAB — BRAIN NATRIURETIC PEPTIDE: B Natriuretic Peptide: 162.7 pg/mL — ABNORMAL HIGH (ref 0.0–100.0)

## 2020-07-30 LAB — I-STAT BETA HCG BLOOD, ED (MC, WL, AP ONLY): I-stat hCG, quantitative: <5 m[IU]/mL (ref <5)

## 2020-07-30 MED ORDER — CARVEDILOL 12.5 MG PO TABS
12.5000 mg | ORAL_TABLET | Freq: Two times a day (BID) | ORAL | 0 refills | Status: AC
  Filled 2020-07-30: qty 60, 30d supply, fill #0

## 2020-07-30 MED ORDER — HYDRALAZINE HCL 25 MG PO TABS
100.0000 mg | ORAL_TABLET | Freq: Once | ORAL | Status: AC
  Administered 2020-07-30: 100 mg via ORAL
  Filled 2020-07-30: qty 4

## 2020-07-30 MED ORDER — LABETALOL HCL 5 MG/ML IV SOLN
20.0000 mg | Freq: Once | INTRAVENOUS | Status: AC
  Administered 2020-07-30: 20 mg via INTRAVENOUS
  Filled 2020-07-30: qty 4

## 2020-07-30 MED ORDER — CLONIDINE HCL 0.2 MG PO TABS
0.3000 mg | ORAL_TABLET | Freq: Once | ORAL | Status: AC
  Administered 2020-07-30: 0.3 mg via ORAL
  Filled 2020-07-30: qty 1

## 2020-07-30 MED ORDER — AMLODIPINE BESYLATE 5 MG PO TABS: 10.0000 mg | ORAL_TABLET | Freq: Once | ORAL | Status: DC

## 2020-07-30 NOTE — ED Provider Notes (Signed)
[facility] EMERGENCY DEPARTMENT Provider Note   CSN: [id number] Arrival date & time: 07/30/20  1210     History Chief Complaint  Patient presents with   Shortness of Breath   Chest Pain    Belinda Ramirez is a 45 y.o. female.  Patient presents chief complaint of chest pain shortness of breath blurry vision.  Symptoms have been off and on for the past 2 to 3 weeks.  She states that she gets like this when her blood pressure is elevated.  She is on several blood pressure medications but states that she still poorly controlled.  She currently denies any chest pain and last episode of chest pain was yesterday night.  She denies any fevers or cough or vomiting or diarrhea.       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 Resolved 5/19 spontaneously    Patient Active Problem List   Diagnosis Date Noted   Hypertensive crisis 06/27/2019   Neurological abnormality 03/28/2018   Hemiplegia and hemiparesis following cerebral infarction affecting left non-dominant side ([place]) 02/26/2018   Globus pharyngeus 09/26/2017   Left leg swelling 09/26/2017   Blurred vision 08/17/2017   History of CVA (cerebrovascular accident) 08/17/2017   Urinary incontinence    Severe hypertension 07/21/2017   PRES (posterior reversible encephalopathy syndrome) 07/11/2017   CKD (chronic kidney disease), stage III ([place]) 01/22/2017   OSA (obstructive sleep apnea) 06/19/2015   Morbid obesity due to excess calories ([place])    Hyperlipidemia    History of cerebral hemorrhage 04/16/2015    Past Surgical History:  Procedure  Laterality Date   TUBAL LIGATION     WISDOM TOOTH EXTRACTION       OB History   No obstetric history on file.     Family History  Problem Relation Age of Onset   Heart attack Maternal Grandfather    Diabetes Mother    Hypertension Mother    Cancer Father    Heart disease Maternal Grandmother    Diabetes Sister    Diabetes Brother    Breast cancer Maternal Aunt     Social History   Tobacco Use   Smoking status: Never   Smokeless tobacco: Never  Vaping Use   Vaping Use: Never used  Substance Use Topics   Alcohol use: Yes    Alcohol/week: 0.0 standard drinks    Comment: occaisonal   Drug use: No    Home Medications Prior to Admission medications   Medication Sig Start Date End Date Taking? Authorizing Provider  carvedilol (COREG) 12.5 MG tablet Take 1 tablet (12.5 mg total) by mouth 2 (two) times daily with a meal. 07/30/20 08/29/20 Yes Kamill Fulbright, Greggory Brandy, MD  Accu-Chek FastClix Lancets MISC Use as instructed. Inject into the skin twice daily 06/30/19   Kayleen Memos, DO  acetaminophen (TYLENOL) 500 MG tablet Take 2 tablets (1,000 mg total) by mouth every 6 (six) hours as needed. Patient not taking: Reported on 05/17/2020 07/19/17   McDonald, Maree Erie A, PA-C  albuterol (PROAIR HFA) 108 (90 Base) MCG/ACT inhaler Inhale 2  puffs into the lungs every 6 (six) hours as needed for wheezing or shortness of breath. 11/09/19   Charlott Rakes, MD  amLODipine (NORVASC) 10 MG tablet Take 1 tablet (10 mg total) by mouth daily. 07/04/19 10/02/19  Gildardo Pounds, NP  atorvastatin (LIPITOR) 80 MG tablet Take 1 tablet (80 mg total) by mouth daily at 6 PM. 07/04/19 10/02/19  Gildardo Pounds, NP  Blood Glucose Calibration (ACCU-CHEK GUIDE CONTROL) LIQD 1 each by In Vitro route once as needed for up to 1 dose. 06/30/19   Kayleen Memos, DO  Blood Pressure Monitor DEVI Please provide patient with insurance approved blood pressure monitor 11/13/18   Gildardo Pounds, NP  cloNIDine (CATAPRES) 0.2 MG tablet Take  1 tablet (0.2 mg total) by mouth 3 (three) times daily. 06/30/19 08/29/19  Kayleen Memos, DO  doxycycline (VIBRA-TABS) 100 MG tablet Take 1 tablet (100 mg total) by mouth 2 (two) times daily. 05/17/20   Blanchie Dessert, MD  escitalopram (LEXAPRO) 20 MG tablet Take 1 tablet (20 mg total) by mouth daily. 07/04/19   Gildardo Pounds, NP  FEROSUL 325 (65 Fe) MG tablet Take 325 mg by mouth daily. 12/20/19   [provider]  fluticasone (FLONASE) 50 MCG/ACT nasal spray Place 2 sprays into both nostrils daily. Patient not taking: Reported on 05/17/2020 02/24/18   Gildardo Pounds, NP  furosemide (LASIX) 20 MG tablet Take 1 tablet (20 mg total) by mouth daily. 05/17/20   Blanchie Dessert, MD  glucose blood (ACCU-CHEK GUIDE) test strip Use as instructed. Check blood glucose by fingerstick twice per day. 06/30/19   Kayleen Memos, DO  hydrALAZINE (APRESOLINE) 100 MG tablet Take 100 mg by mouth 3 (three) times daily. 09/27/19   [provider]  lisinopril (ZESTRIL) 40 MG tablet Take 1 tablet (40 mg total) by mouth daily. 07/04/19 10/02/19  Gildardo Pounds, NP  Misc. Devices MISC Please provide patient with insurance approved thigh high compression stockings for swelling. 11/13/18   Gildardo Pounds, NP  Misc. Devices MISC Please provide patient with insurance approved adult diapers size 3XL for urinary incontinence. ICD-10 N39.46 01/04/19   Gildardo Pounds, NP  Misc. Devices MISC -Please provide patient with insurance approved:  CPAP with autopap 10-20. Medium ResMed AirFit F20 mask with heated humidity. DX G47.33 08/06/19   Gildardo Pounds, NP  potassium chloride SA (KLOR-CON) 20 MEQ tablet Take 20 mEq by mouth daily. 09/27/19   [provider]  predniSONE (DELTASONE) 20 MG tablet Take 1 tablet (20 mg total) by mouth daily. 05/17/20   Blanchie Dessert, MD  traZODone (DESYREL) 50 MG tablet Take 50-100 mg by mouth at bedtime as needed for sleep. 08/13/19   [provider]    Allergies     Oxycodone, Percocet [oxycodone-acetaminophen], Amoxicillin, Aspirin, Latex, and Tape  Review of Systems   Review of Systems  Constitutional:  Negative for fever.  HENT:  Negative for ear pain.   Eyes:  Negative for pain.  Respiratory:  Negative for cough.   Cardiovascular:  Positive for chest pain.  Gastrointestinal:  Negative for abdominal pain.  Genitourinary:  Negative for flank pain.  Musculoskeletal:  Negative for back pain.  Skin:  Negative for rash.  Neurological:  Negative for headaches.   Physical Exam Updated Vital Signs BP (!) 178/161   Pulse 77   Temp 98.4 F (36.9 C) (Oral)   Resp (!) 30   SpO2 99%   Physical Exam Constitutional:  General: She is not in acute distress.    Appearance: Normal appearance.  HENT:     Head: Normocephalic.     Nose: Nose normal.  Eyes:     Extraocular Movements: Extraocular movements intact.  Cardiovascular:     Rate and Rhythm: Normal rate.  Pulmonary:     Effort: Pulmonary effort is normal.  Musculoskeletal:        General: Normal range of motion.     Cervical back: Normal range of motion.  Neurological:     General: No focal deficit present.     Mental Status: She is alert. Mental status is at baseline.     Comments: 5/5 strength all extremities.  Cranial nerves II to XII intact.  Finger-nose intact.  Gait is normal.    ED Results / Procedures / Treatments   Labs (all labs ordered are listed, but only abnormal results are displayed) Labs Reviewed  CBC WITH DIFFERENTIAL/PLATELET - Abnormal; Notable for the following components:      Result Value   Hemoglobin 9.4 (*)    HCT 32.0 (*)    MCV 66.3 (*)    MCH 19.5 (*)    MCHC 29.4 (*)    RDW 20.7 (*)    All other components within normal limits  BASIC METABOLIC PANEL - Abnormal; Notable for the following components:   Creatinine, Ser 1.45 (*)    Calcium 8.4 (*)    GFR, Estimated 45 (*)    All other components within normal limits  BRAIN NATRIURETIC PEPTIDE -  Abnormal; Notable for the following components:   B Natriuretic Peptide 162.7 (*)    All other components within normal limits  URINALYSIS, ROUTINE W REFLEX MICROSCOPIC - Abnormal; Notable for the following components:   APPearance TURBID (*)    Hgb urine dipstick SMALL (*)    Protein, ur 100 (*)    Leukocytes,Ua LARGE (*)    WBC, UA >50 (*)    Bacteria, UA MANY (*)    Squamous Epithelial / LPF >50 (*)    All other components within normal limits  TROPONIN I (HIGH SENSITIVITY) - Abnormal; Notable for the following components:   Troponin I (High Sensitivity) 30 (*)    All other components within normal limits  TROPONIN I (HIGH SENSITIVITY) - Abnormal; Notable for the following components:   Troponin I (High Sensitivity) 41 (*)    All other components within normal limits  RESP PANEL BY RT-PCR (FLU A&B, COVID) ARPGX2  URINALYSIS, ROUTINE W REFLEX MICROSCOPIC  I-STAT BETA HCG BLOOD, ED ([facility], [facility], [facility] ONLY)    EKG   Radiology DG Chest 2 View  Result Date: 07/30/2020 CLINICAL DATA:  Short of breath chest pain EXAM: CHEST - 2 VIEW COMPARISON:  05/16/2020 FINDINGS: Cardiac enlargement. Mild vascular congestion without edema. Streaky lung markings in the bases are unchanged from prior studies and most likely due to scarring and or atelectasis. No significant pleural effusion. IMPRESSION: Cardiac enlargement with mild vascular congestion. Negative for edema or effusion. Electronically Signed   By: Franchot Gallo M.D.   On: 07/30/2020 13:45   CT Head Wo Contrast  Result Date: 07/30/2020 CLINICAL DATA:  TIA. EXAM: CT HEAD WITHOUT CONTRAST TECHNIQUE: Contiguous axial images were obtained from the base of the skull through the vertex without intravenous contrast. COMPARISON:  CT head 06/27/2019 FINDINGS: Brain: Chronic white matter changes bilaterally right greater than left stable from the prior study and consistent with chronic microvascular ischemia. Mild ventricular enlargement stable from the  prior study and most likely due to atrophy. Chronic infarct in the thalamus bilaterally. Negative for acute infarct, hemorrhage, mass Vascular: Negative for hyperdense vessel Skull: Negative Sinuses/Orbits: Paranasal sinuses clear.  Negative orbit Other: None IMPRESSION: No acute abnormality Chronic microvascular ischemic change stable since the prior CT. Electronically Signed   By: Franchot Gallo M.D.   On: 07/30/2020 13:47    Procedures .Critical Care  Date/Time: 07/30/2020 9:49 PM Performed by: Luna Fuse, MD Authorized by: Luna Fuse, MD   Critical care provider statement:    Critical care time (minutes):  40   Critical care time was exclusive of:  Separately billable procedures and treating other patients and teaching time   Critical care was necessary to treat or prevent imminent or life-threatening deterioration of the following conditions:  Circulatory failure   Medications Ordered in ED Medications  hydrALAZINE (APRESOLINE) tablet 100 mg (100 mg Oral Given 07/30/20 1629)  cloNIDine (CATAPRES) tablet 0.3 mg (0.3 mg Oral Given 07/30/20 1755)  labetalol (NORMODYNE) injection 20 mg (20 mg Intravenous Given 07/30/20 2031)    ED Course  I have reviewed the triage vital signs and the nursing notes.  Pertinent labs & imaging results that were available during my care of the patient were reviewed by me and considered in my medical decision making (see chart for details).    MDM Rules/Calculators/A&P                          Initial blood pressure was severely elevated.  Patient given multiple doses of blood pressure medications to ultimately bring her blood pressure down to 0000000 systolic.  Troponin slightly elevated but consistent with prior troponin levels in the past.  EKG shows sinus rhythm no ST elevations depressions normal rate normal rhythm noted.  Remainder of labs otherwise unremarkable, urinalysis positive but has several epithelial cells and is a contaminant.  Patient  has no urinary symptoms and unable to provide another sample at this time.  Case discussed with on-call cardiology at 9:30 PM who recommends adding carvedilol to her regimen of Norvasc clonidine and hydralazine.  Patient expressed understanding, discharged home in stable condition.  Advising her to follow-up with her primary care doctor within the week and advising immediate return if she has fevers pain worsening symptoms or any additional concerns.   Final Clinical Impression(s) / ED Diagnoses Final diagnoses:  Chest pain, unspecified type  Hypertension, unspecified type    Rx / DC Orders ED Discharge Orders          Ordered    carvedilol (COREG) 12.5 MG tablet  2 times daily with meals        07/30/20 2152             Luna Fuse, MD 07/30/20 2153

## 2020-07-30 NOTE — ED Triage Notes (Signed)
Pt here with shob X3 weeks, worse with exertion. Pt also c/o central chest pain.

## 2020-07-30 NOTE — ED Notes (Signed)
Trending up hypertension noted. Pt evaluated and reports feeling bad and facial numbness. MD [place] notified.

## 2020-07-30 NOTE — ED Provider Notes (Signed)
Emergency Medicine Provider Triage Evaluation Note  Belinda Ramirez , a 45 y.o. female  was evaluated in triage.  Pt complains of blurry vision in right eye and worsening shortness of breath x3 weeks. Patient states her right eye has intermittently been blurry; however she notes she can still see out of her right eye. No headaches. Previous history of CVA. She states she has been forgetting more things over the past few weeks. She states she has felt this way before when her BP was elevated. History of PRES. Shortness of breath is worse with exertion and when going outside in the head associated with central, non-radiating chest pain.   Review of Systems  Positive: Visual disturbance, shortness of breath Negative: fever  Physical Exam  BP (!) 193/123 (BP Location: Right Wrist)   Pulse 76   Temp 98.8 F (37.1 C) (Oral)   Resp 16   SpO2 97%  Gen:   Awake, no distress   Resp:  Normal effort  MSK:   Moves extremities without difficulty  Other:  No facial droop. Follows commands  Medical Decision Making  Medically screening exam initiated at 1:00 PM.  Appropriate orders placed.  Marka Notch was informed that the remainder of the evaluation will be completed by another provider, this initial triage assessment does not replace that evaluation, and the importance of remaining in the ED until their evaluation is complete.  Blurry vision x3 weeks. History of CVA and PRES. Patient hypertensive at triage. Out of TPA window. Labs to rule out end organ damage due to HTN. CT head.   Shortness of breath x3 weeks with exertion. Mild 1+ pitting edema bilaterally. BNP to rule out CHF. CXR and cardiac labs.    Suzy Bouchard, PA-C 07/30/20 1304    Charlesetta Shanks, MD 07/31/20 1510

## 2020-07-30 NOTE — ED Notes (Signed)
Second pg to cards by Panzy Bubeck per dr Almyra Free

## 2020-07-30 NOTE — ED Notes (Signed)
PA notified of pt BP.

## 2020-07-30 NOTE — Discharge Instructions (Addendum)
Call your primary care doctor or specialist as discussed in the next 2-3 days.   Return immediately back to the ER if:  Your symptoms worsen within the next 12-24 hours. You develop new symptoms such as new fevers, persistent vomiting, new pain, shortness of breath, or new weakness or numbness, or if you have any other concerns.  

## 2020-07-30 NOTE — Telephone Encounter (Signed)
Reports has had shortness of breath with exertion x 3 weeks. Agent has made appointment for today. Denies any chest pain.

## 2020-07-30 NOTE — Telephone Encounter (Signed)
Answer Assessment - Initial Assessment Questions 1. RESPIRATORY STATUS: "Describe your breathing?" (e.g., wheezing, shortness of breath, unable to speak, severe coughing)      Shortness of breath 2. ONSET: "When did this breathing problem begin?"      3 weeks 3. PATTERN "Does the difficult breathing come and go, or has it been constant since it started?"     Comes and goes 4. SEVERITY: "How bad is your breathing?" (e.g., mild, moderate, severe)    - MILD: No SOB at rest, mild SOB with walking, speaks normally in sentences, can lie down, no retractions, pulse < 100.    - MODERATE: SOB at rest, SOB with minimal exertion and prefers to sit, cannot lie down flat, speaks in phrases, mild retractions, audible wheezing, pulse 100-120.    - SEVERE: Very SOB at rest, speaks in single words, struggling to breathe, sitting hunched forward, retractions, pulse > 120      Mild- moderate 5. RECURRENT SYMPTOM: "Have you had difficulty breathing before?" If Yes, ask: "When was the last time?" and "What happened that time?"      No 6. CARDIAC HISTORY: "Do you have any history of heart disease?" (e.g., heart attack, angina, bypass surgery, angioplasty)      No 7. LUNG HISTORY: "Do you have any history of lung disease?"  (e.g., pulmonary embolus, asthma, emphysema)     Yes 8. CAUSE: "What do you think is causing the breathing problem?"      Unsure 9. OTHER SYMPTOMS: "Do you have any other symptoms? (e.g., dizziness, runny nose, cough, chest pain, fever)     No 10. O2 SATURATION MONITOR:  "Do you use an oxygen saturation monitor (pulse oximeter) at home?" If Yes, "What is your reading (oxygen level) today?" "What is your usual oxygen saturation reading?" (e.g., 95%)       No 11. PREGNANCY: "Is there any chance you are pregnant?" "When was your last menstrual period?"       No 12. TRAVEL: "Have you traveled out of the country in the last month?" (e.g., travel history, exposures)       No  Protocols used:  Breathing Difficulty-A-AH

## 2020-07-30 NOTE — ED Provider Notes (Signed)
Informed by RN about patient's BP. Called [place], Agricultural consultant,  to inform him of need for a room.    Suzy Bouchard, PA-C 07/30/20 1521    Charlesetta Shanks, MD 07/31/20 [phone]

## 2020-07-31 ENCOUNTER — Other Ambulatory Visit: Payer: Self-pay

## 2020-07-31 ENCOUNTER — Telehealth: Payer: Self-pay | Admitting: Nurse Practitioner

## 2020-07-31 NOTE — Telephone Encounter (Signed)
Pt was called several time Vm full can not leave a message Pt has several  numbers all where called  a man answered one of the numbers told me to call another number went straight to VM

## 2020-08-07 ENCOUNTER — Other Ambulatory Visit: Payer: Self-pay

## 2020-11-25 IMAGING — US US RENAL
1 series · 14 of 25 positions shown · non-contrast
Comparison: None.

CLINICAL DATA: Chronic kidney disease stage 3

EXAM:
RENAL / URINARY TRACT ULTRASOUND COMPLETE

[Series 1: us renal · 0.23mm/px · 14 of 39 slices shown]
[im 1/39]
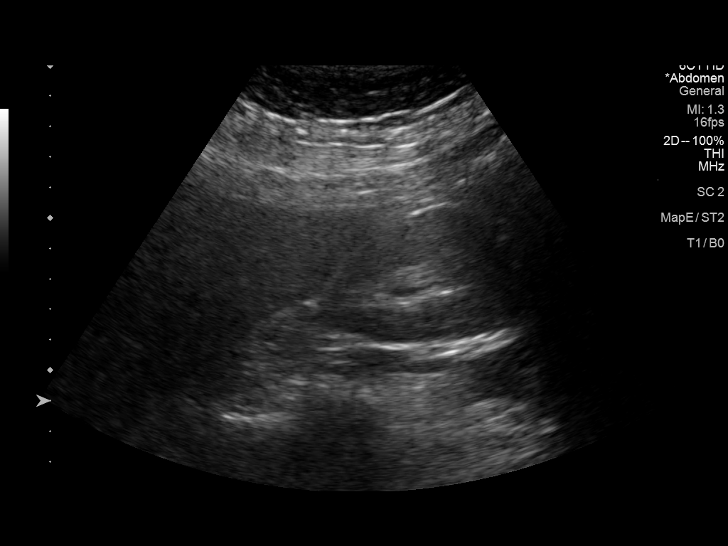
[im 4/39]
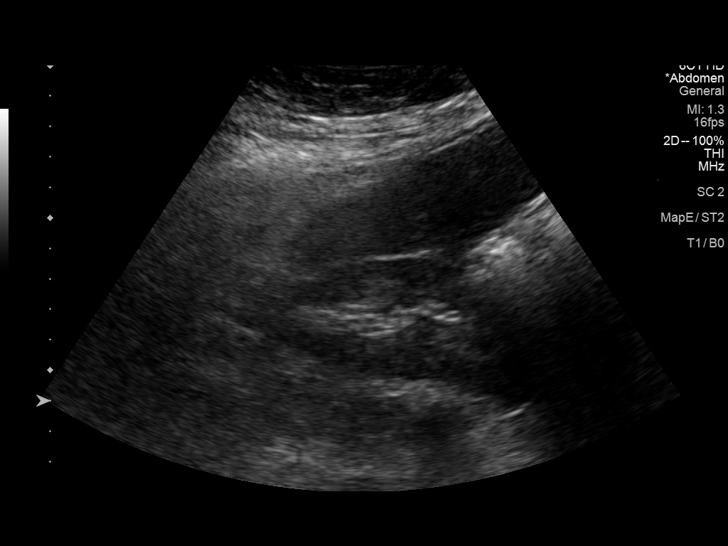
[im 7/39]
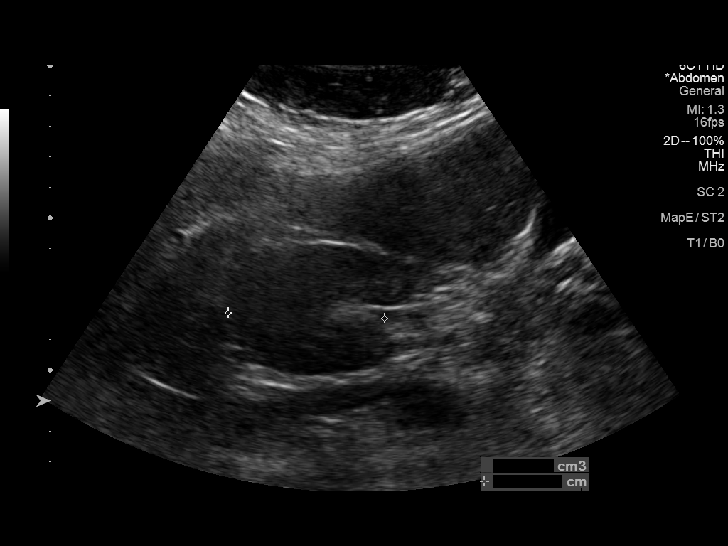
[im 10/39]
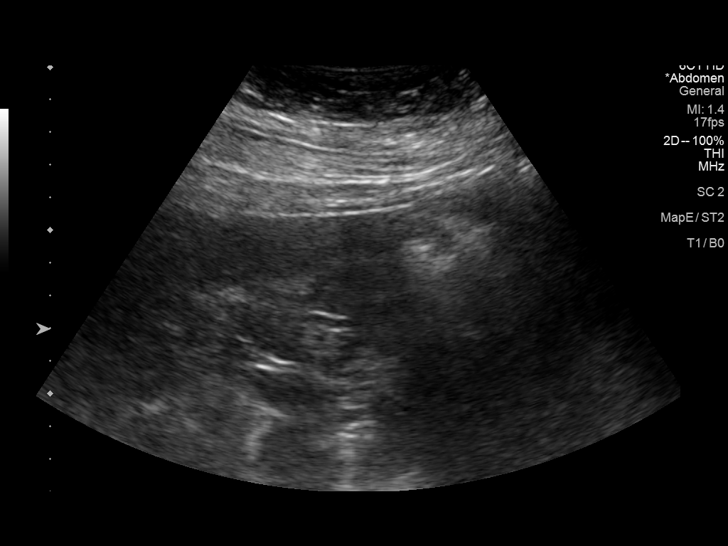
[im 13/39]
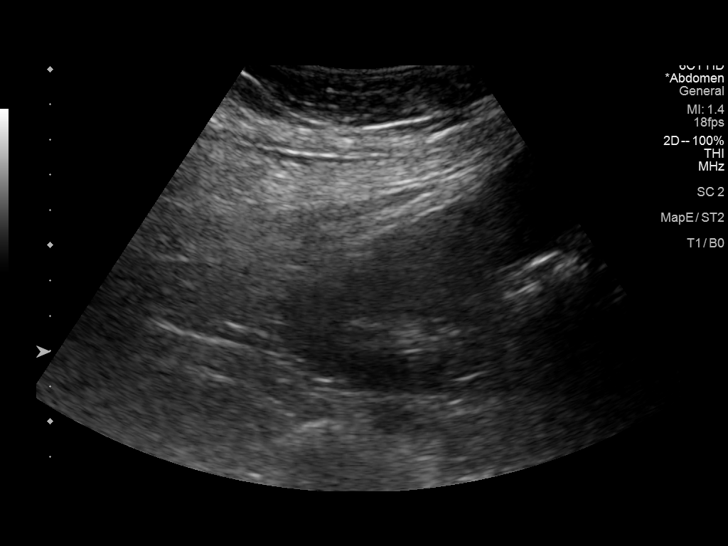
[im 15/39]
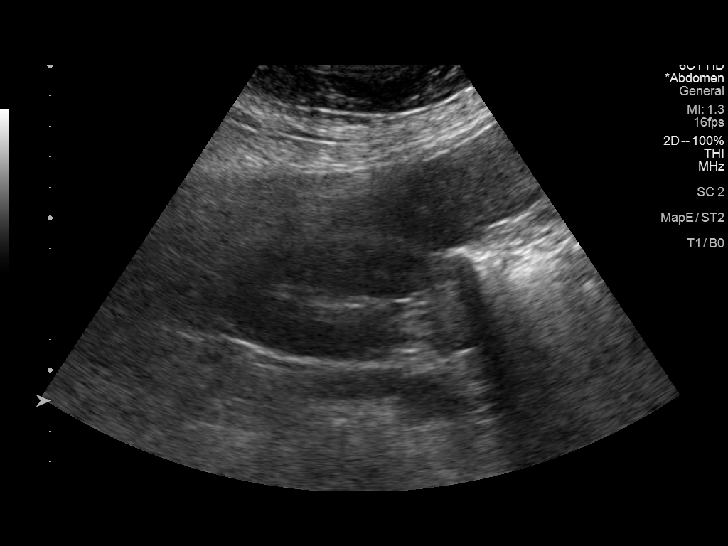
[im 18/39]
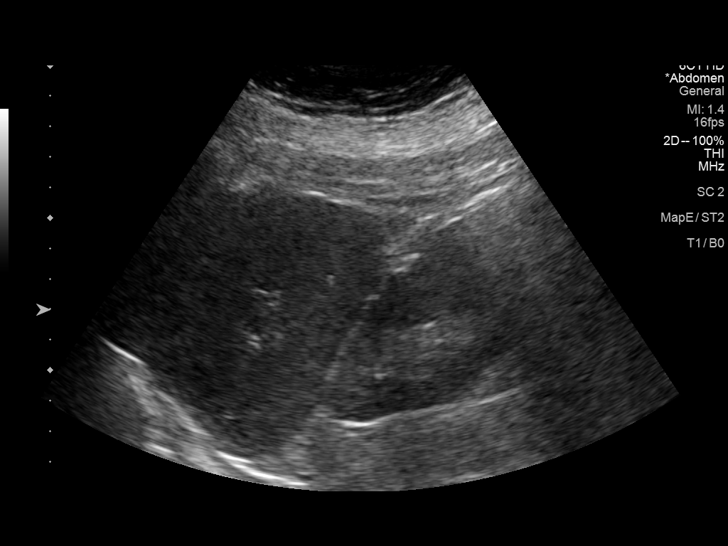
[im 21/39]
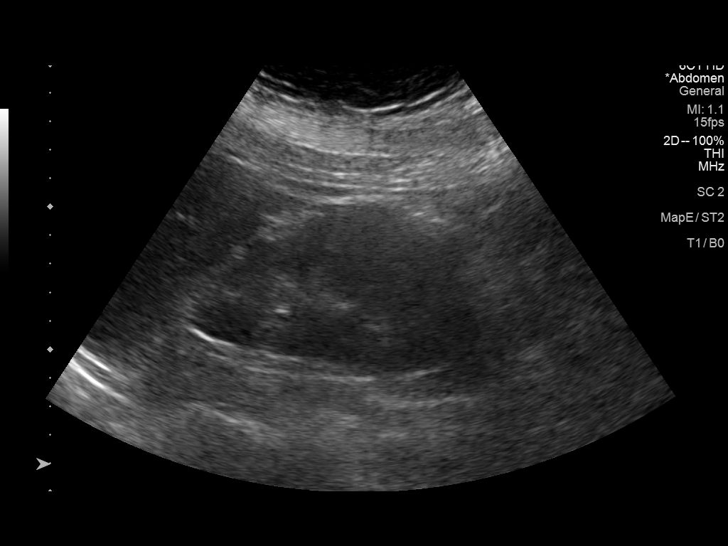
[im 24/39]
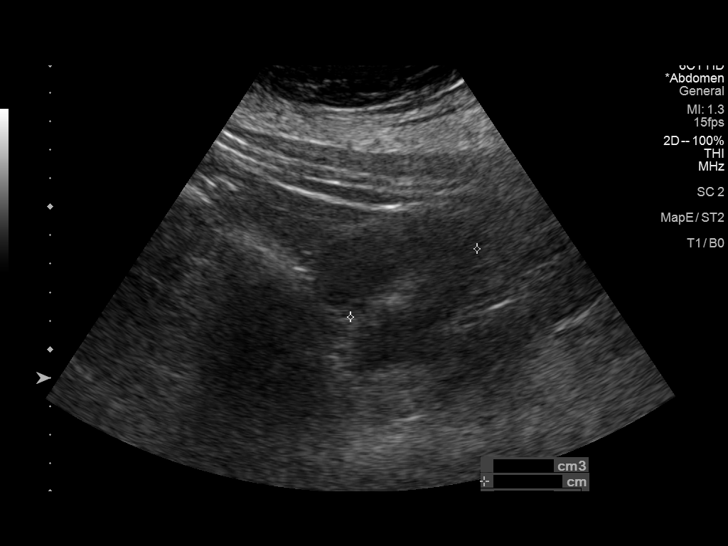
[im 26/39]
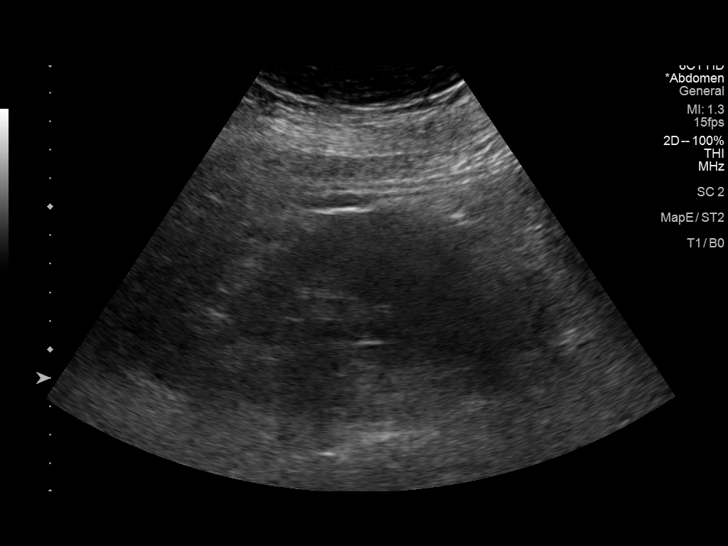
[im 29/39]
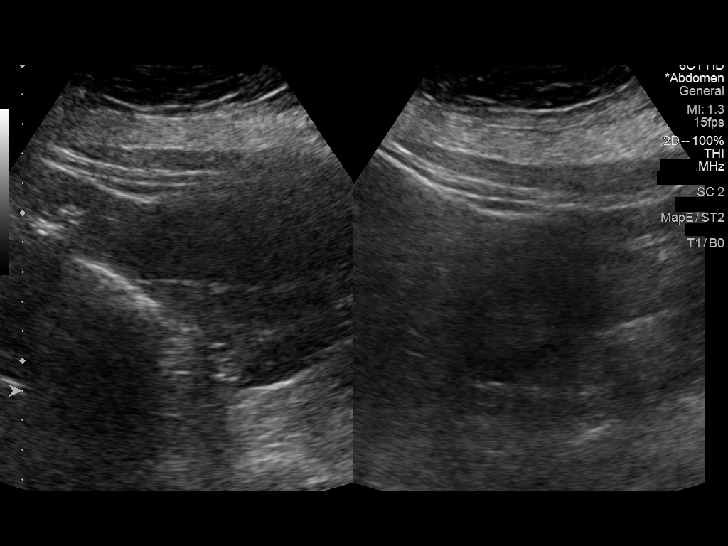
[im 32/39]
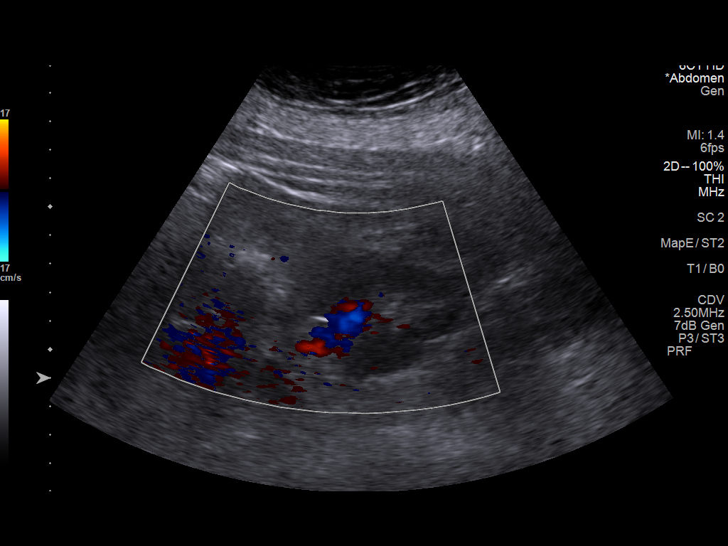
[im 35/39]
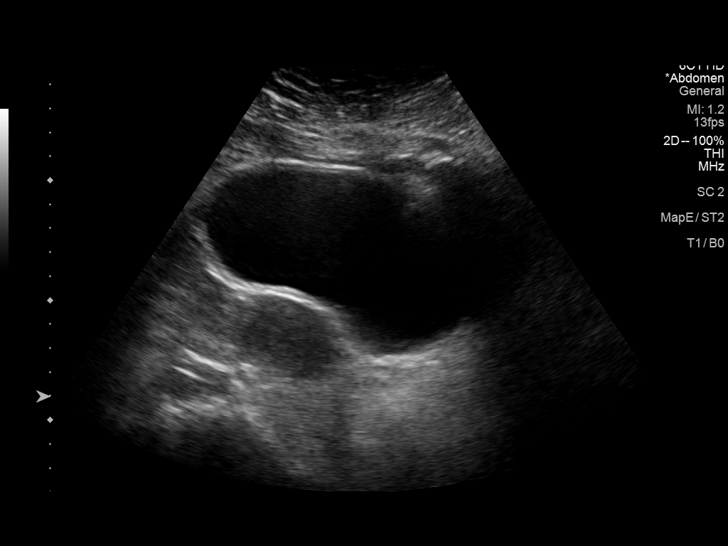
[im 39/39]
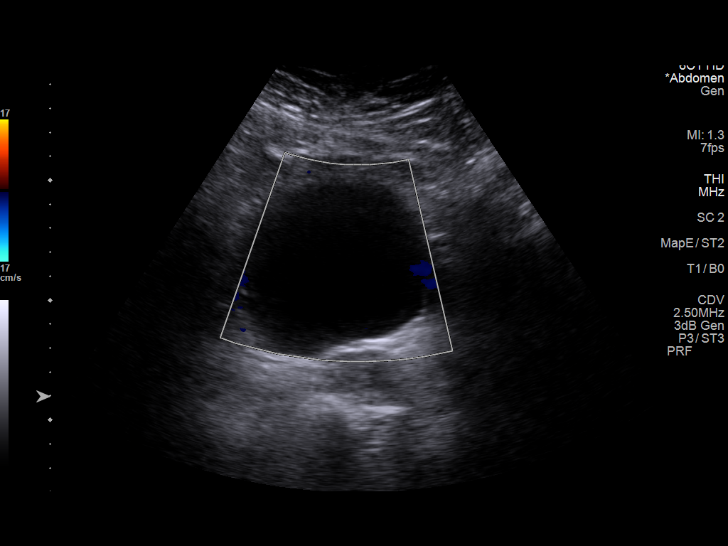

[14 of 25 positions shown; findings below may reference images not displayed]

FINDINGS: Limited exam because of body habitus.

Right Kidney:

Renal measurements: 10.5 x 4.1 x 5.1 cm = volume: 114 mL .
Echogenicity within normal limits. No mass or hydronephrosis
visualized.

Left Kidney:

Renal measurements: 10.2 x 5.5 x 5.0 cm = volume: 149 mL.
Echogenicity within normal limits. No mass or hydronephrosis
visualized.

Bladder:

Appears normal for degree of bladder distention.
IMPRESSION: No significant or acute finding by ultrasound. Negative for
hydronephrosis.

## 2021-02-13 ENCOUNTER — Telehealth: Payer: Self-pay

## 2021-02-13 NOTE — Telephone Encounter (Signed)
Called to make a virtual appointment for incontinence supplies and vm is full. Also pt need an appointment for an office visit.

## 2021-02-23 ENCOUNTER — Other Ambulatory Visit: Payer: Self-pay

## 2021-03-18 ENCOUNTER — Ambulatory Visit: Payer: Medicare HMO | Admitting: Physician Assistant

## 2021-04-16 ENCOUNTER — Ambulatory Visit: Payer: Medicare HMO | Admitting: Physician Assistant

## 2021-04-22 ENCOUNTER — Ambulatory Visit: Payer: Self-pay
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
## 2021-04-24 ENCOUNTER — Encounter (HOSPITAL_COMMUNITY): Payer: Self-pay

## 2021-04-24 ENCOUNTER — Ambulatory Visit (HOSPITAL_COMMUNITY)
Admission: EM | Admit: 2021-04-24 | Discharge: 2021-04-24 | Disposition: A | Discharge status: Home / Self Care | Payer: Medicare HMO | Attending: Family Medicine | Admitting: Family Medicine

## 2021-04-24 ENCOUNTER — Ambulatory Visit (INDEPENDENT_AMBULATORY_CARE_PROVIDER_SITE_OTHER): Payer: Medicare HMO

## 2021-04-24 ENCOUNTER — Other Ambulatory Visit: Payer: Self-pay

## 2021-04-24 DIAGNOSIS — M25562 Pain in left knee: Secondary | ICD-10-CM | POA: Diagnosis not present

## 2021-04-24 DIAGNOSIS — I10 Essential (primary) hypertension: Secondary | ICD-10-CM

## 2021-04-24 DIAGNOSIS — S8992XA Unspecified injury of left lower leg, initial encounter: Secondary | ICD-10-CM | POA: Diagnosis not present
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
## 2021-05-26 IMAGING — MG DIGITAL SCREENING BILAT W/ TOMO W/ CAD
6 of 10 series · 6 of 30 positions shown · non-contrast
Comparison: None.

CLINICAL DATA: Screening.

EXAM:
DIGITAL SCREENING BILATERAL MAMMOGRAM WITH TOMO AND CAD

[R CC synth-2D]
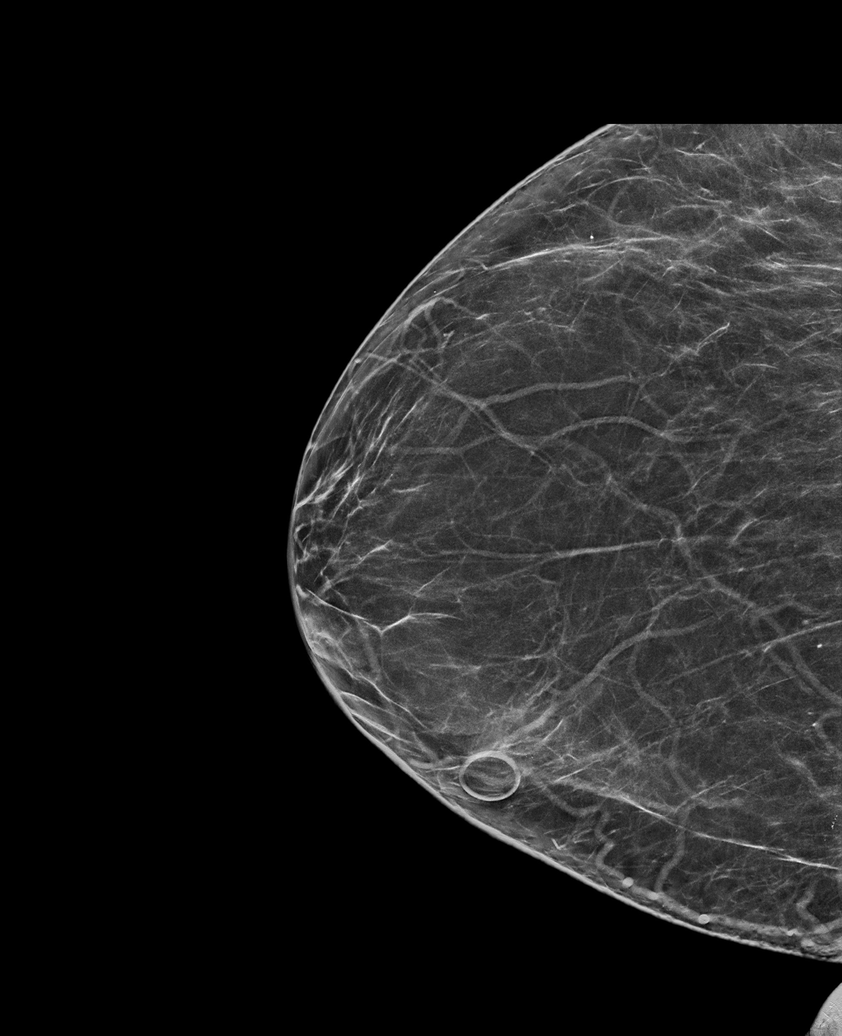

[L CC synth-2D]
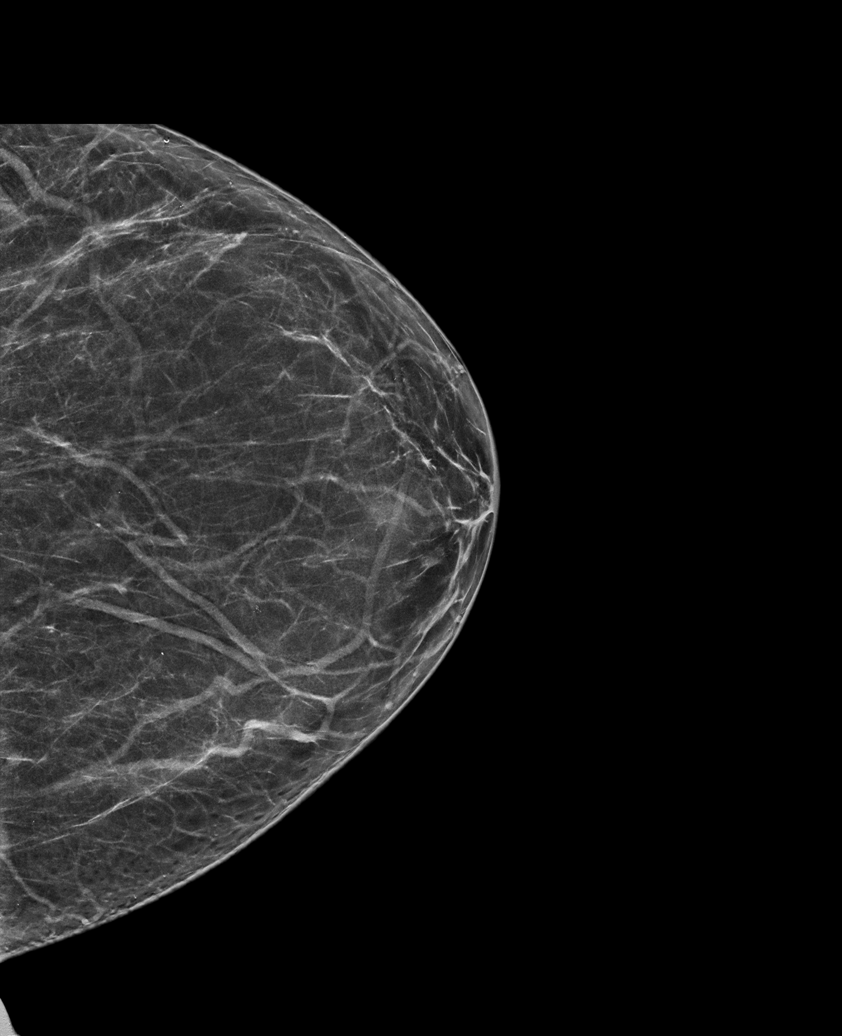

[L MLO synth-2D (1 of 2)]
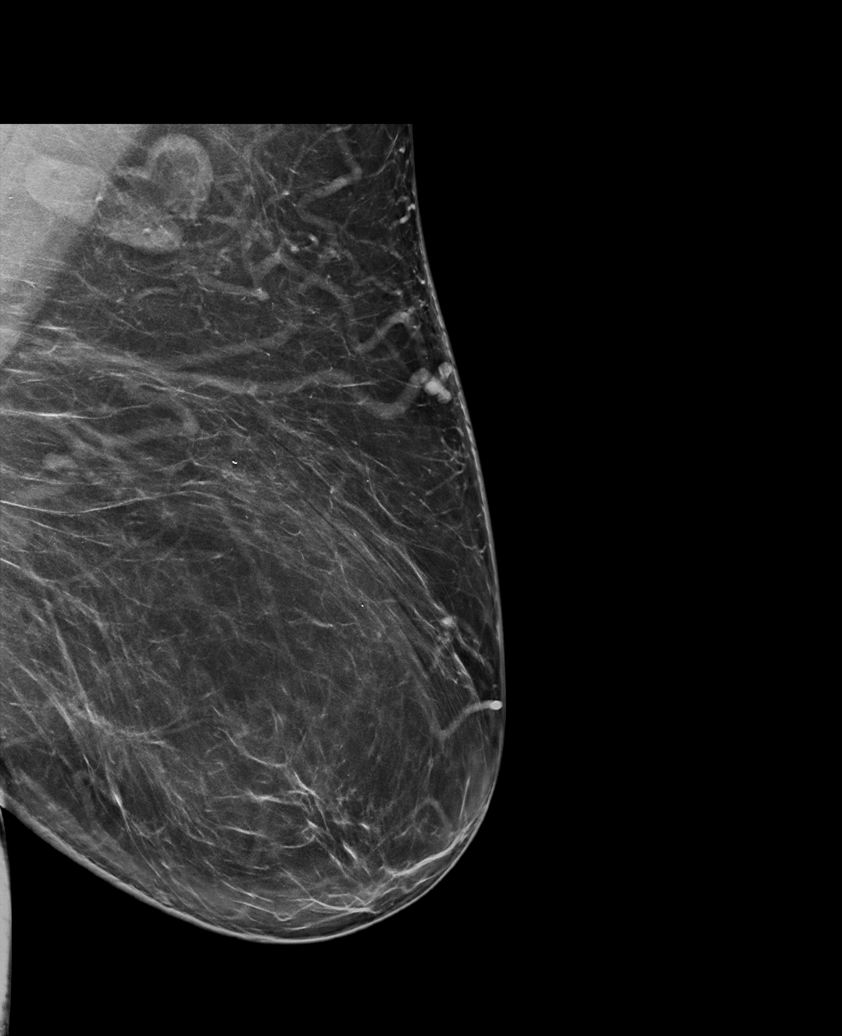

[R MLO synth-2D]
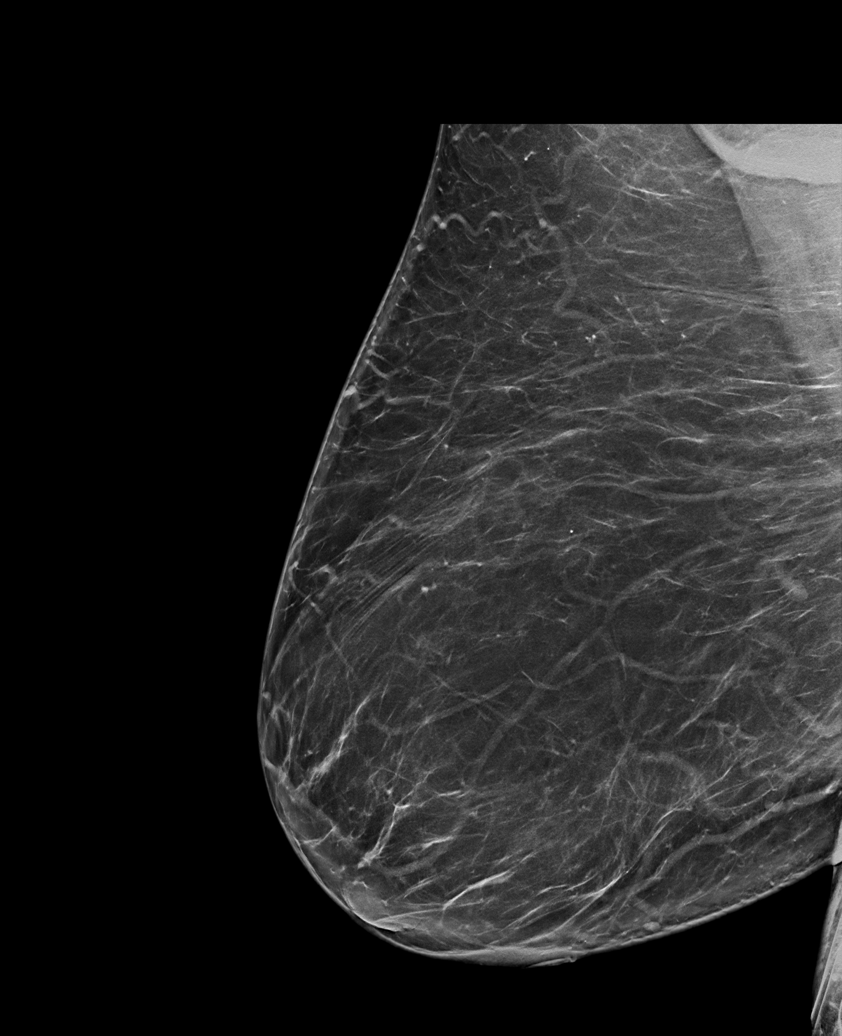

[L MLO synth-2D (2 of 2)]
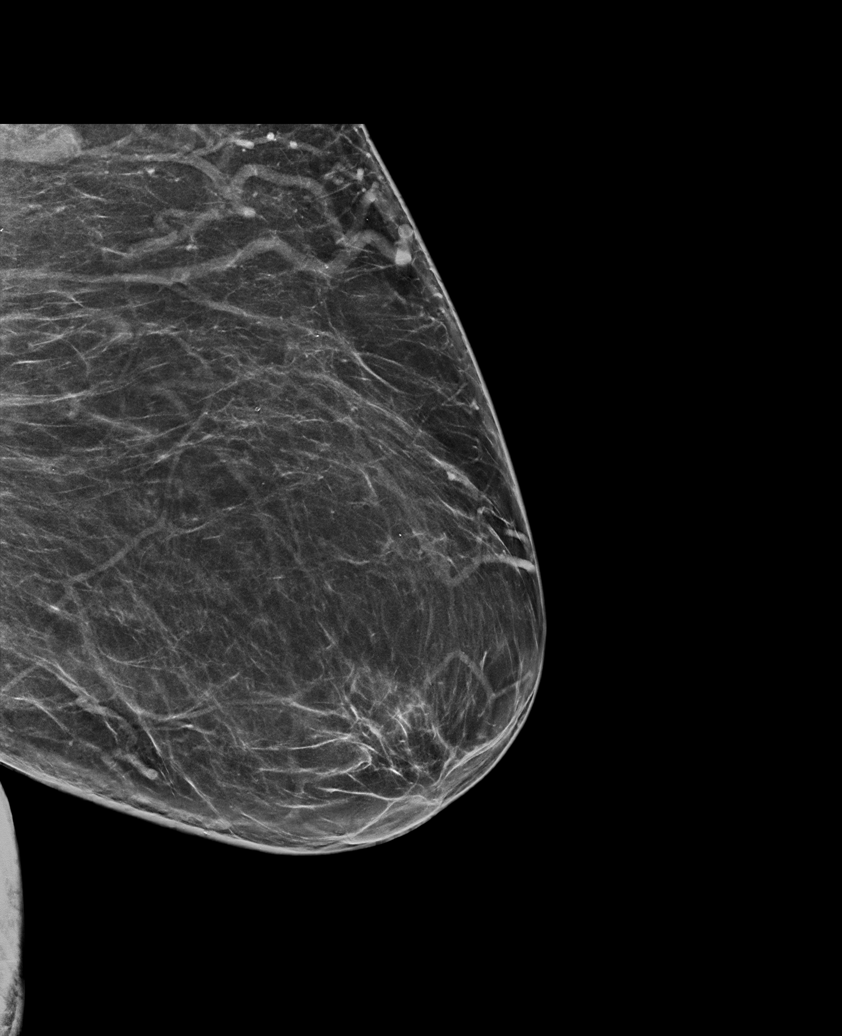

[R MLO tomo · tomo slice 47/94.0]
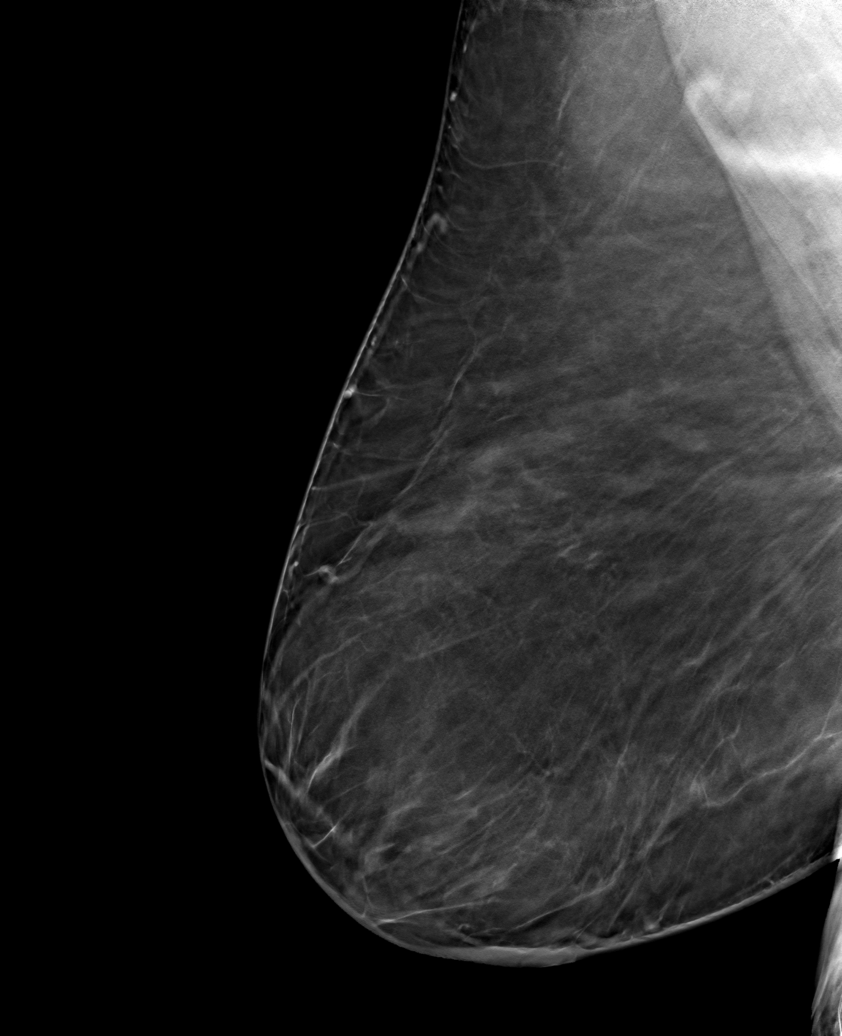

[6 of 30 positions shown; findings below may reference images not displayed]

ACR Breast Density Category b: There are scattered areas of
fibroglandular density.
FINDINGS: There are no findings suspicious for malignancy. Images were
processed with CAD.
IMPRESSION: No mammographic evidence of malignancy. A result letter of this
screening mammogram will be mailed directly to the patient.

RECOMMENDATION:
Screening mammogram in one year. (Code:[id number]-G-EJ6)

BI-RADS CATEGORY  1: Negative.

## 2021-07-03 ENCOUNTER — Encounter (HOSPITAL_COMMUNITY): Payer: Self-pay | Admitting: Emergency Medicine

## 2021-07-03 ENCOUNTER — Ambulatory Visit (HOSPITAL_BASED_OUTPATIENT_CLINIC_OR_DEPARTMENT_OTHER)
Admit: 2021-07-03 | Discharge: 2021-07-03 | Disposition: A | Discharge status: Home / Self Care | Payer: Medicare HMO | Attending: Family Medicine | Admitting: Family Medicine

## 2021-07-03 ENCOUNTER — Ambulatory Visit (HOSPITAL_COMMUNITY)
Admission: EM | Admit: 2021-07-03 | Discharge: 2021-07-03 | Disposition: A | Discharge status: Home / Self Care | Payer: Medicare HMO | Attending: Family Medicine | Admitting: Family Medicine

## 2021-07-03 DIAGNOSIS — R600 Localized edema: Secondary | ICD-10-CM | POA: Insufficient documentation

## 2021-07-03 DIAGNOSIS — M79605 Pain in left leg: Secondary | ICD-10-CM | POA: Diagnosis not present

## 2021-07-03 DIAGNOSIS — M79662 Pain in left lower leg: Secondary | ICD-10-CM
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
## 2021-07-03 NOTE — Progress Notes (Signed)
Left lower extremity venous duplex completed. Refer to "CV Proc" under chart review to view preliminary results.  07/03/2021 11:01 AM Kelby Aline., MHA, RVT, RDCS, RDMS

## 2021-07-03 NOTE — Discharge Instructions (Addendum)
You were seen today for left leg pain and swelling.  I have ordered an ultrasound to rule out a clot of the leg.  This appointment is at 1pm today.  If this is positive, we will treat you with medication.  If this is negative for clot, then you really should follow up with your primary care provider to discuss medication for swelling, and to discuss possible physical therapy for you leg and mobility.

## 2021-07-03 NOTE — ED Notes (Signed)
Pt appt for DVT rule out has been scheduled for 1pm today at [facility]

## 2021-09-09 DIAGNOSIS — G9341 Metabolic encephalopathy: Secondary | ICD-10-CM | POA: Diagnosis not present

## 2021-09-09 DIAGNOSIS — N3289 Other specified disorders of bladder: Secondary | ICD-10-CM | POA: Diagnosis not present

## 2021-09-09 DIAGNOSIS — N179 Acute kidney failure, unspecified: Secondary | ICD-10-CM | POA: Diagnosis not present

## 2021-09-09 DIAGNOSIS — G929 Unspecified toxic encephalopathy: Secondary | ICD-10-CM | POA: Diagnosis not present

## 2021-09-09 DIAGNOSIS — R9082 White matter disease, unspecified: Secondary | ICD-10-CM | POA: Diagnosis not present

## 2021-09-09 DIAGNOSIS — I517 Cardiomegaly: Secondary | ICD-10-CM | POA: Diagnosis not present

## 2021-09-09 DIAGNOSIS — Z6841 Body Mass Index (BMI) 40.0 and over, adult: Secondary | ICD-10-CM | POA: Diagnosis not present

## 2021-09-09 DIAGNOSIS — I639 Cerebral infarction, unspecified: Secondary | ICD-10-CM | POA: Diagnosis not present

## 2021-09-09 DIAGNOSIS — I161 Hypertensive emergency: Secondary | ICD-10-CM | POA: Diagnosis not present

## 2021-09-09 DIAGNOSIS — N3001 Acute cystitis with hematuria: Secondary | ICD-10-CM | POA: Diagnosis not present

## 2021-09-09 DIAGNOSIS — I6523 Occlusion and stenosis of bilateral carotid arteries: Secondary | ICD-10-CM | POA: Diagnosis not present

## 2021-09-09 DIAGNOSIS — I674 Hypertensive encephalopathy: Secondary | ICD-10-CM | POA: Diagnosis not present

## 2021-09-09 DIAGNOSIS — I6781 Acute cerebrovascular insufficiency: Secondary | ICD-10-CM | POA: Diagnosis not present

## 2021-09-09 DIAGNOSIS — J9811 Atelectasis: Secondary | ICD-10-CM | POA: Diagnosis not present

## 2021-09-09 DIAGNOSIS — I6782 Cerebral ischemia: Secondary | ICD-10-CM | POA: Diagnosis not present

## 2021-09-09 DIAGNOSIS — R531 Weakness: Secondary | ICD-10-CM | POA: Diagnosis not present

## 2021-09-09 DIAGNOSIS — N182 Chronic kidney disease, stage 2 (mild): Secondary | ICD-10-CM | POA: Diagnosis not present

## 2021-09-09 DIAGNOSIS — N390 Urinary tract infection, site not specified: Secondary | ICD-10-CM | POA: Diagnosis not present

## 2021-09-09 DIAGNOSIS — E1122 Type 2 diabetes mellitus with diabetic chronic kidney disease: Secondary | ICD-10-CM | POA: Diagnosis not present

## 2021-09-09 DIAGNOSIS — G319 Degenerative disease of nervous system, unspecified: Secondary | ICD-10-CM | POA: Diagnosis not present

## 2021-09-09 DIAGNOSIS — Y92410 Unspecified street and highway as the place of occurrence of the external cause: Secondary | ICD-10-CM | POA: Diagnosis not present

## 2021-09-09 DIAGNOSIS — R4182 Altered mental status, unspecified: Secondary | ICD-10-CM | POA: Diagnosis not present

## 2021-09-09 DIAGNOSIS — R0989 Other specified symptoms and signs involving the circulatory and respiratory systems: Secondary | ICD-10-CM | POA: Diagnosis not present

## 2021-09-09 DIAGNOSIS — R69 Illness, unspecified: Secondary | ICD-10-CM | POA: Diagnosis not present

## 2021-09-09 DIAGNOSIS — D638 Anemia in other chronic diseases classified elsewhere: Secondary | ICD-10-CM | POA: Diagnosis not present

## 2021-09-09 DIAGNOSIS — Z8673 Personal history of transient ischemic attack (TIA), and cerebral infarction without residual deficits: Secondary | ICD-10-CM | POA: Diagnosis not present

## 2021-09-10 DIAGNOSIS — E669 Obesity, unspecified: Secondary | ICD-10-CM | POA: Diagnosis not present

## 2021-09-10 DIAGNOSIS — I6781 Acute cerebrovascular insufficiency: Secondary | ICD-10-CM | POA: Diagnosis not present

## 2021-09-10 DIAGNOSIS — N3289 Other specified disorders of bladder: Secondary | ICD-10-CM | POA: Diagnosis not present

## 2021-09-10 DIAGNOSIS — R4182 Altered mental status, unspecified: Secondary | ICD-10-CM | POA: Diagnosis not present

## 2021-09-10 DIAGNOSIS — N182 Chronic kidney disease, stage 2 (mild): Secondary | ICD-10-CM | POA: Diagnosis not present

## 2021-09-10 DIAGNOSIS — J9811 Atelectasis: Secondary | ICD-10-CM | POA: Diagnosis not present

## 2021-09-10 DIAGNOSIS — N390 Urinary tract infection, site not specified: Secondary | ICD-10-CM | POA: Diagnosis not present

## 2021-09-10 DIAGNOSIS — R0989 Other specified symptoms and signs involving the circulatory and respiratory systems: Secondary | ICD-10-CM | POA: Diagnosis not present

## 2021-09-10 DIAGNOSIS — R69 Illness, unspecified: Secondary | ICD-10-CM | POA: Diagnosis not present

## 2021-09-10 DIAGNOSIS — I10 Essential (primary) hypertension: Secondary | ICD-10-CM | POA: Diagnosis not present

## 2021-09-10 DIAGNOSIS — I3489 Other nonrheumatic mitral valve disorders: Secondary | ICD-10-CM | POA: Diagnosis not present

## 2021-09-10 DIAGNOSIS — E1165 Type 2 diabetes mellitus with hyperglycemia: Secondary | ICD-10-CM | POA: Diagnosis not present

## 2021-09-10 DIAGNOSIS — G319 Degenerative disease of nervous system, unspecified: Secondary | ICD-10-CM | POA: Diagnosis not present

## 2021-09-10 DIAGNOSIS — G9340 Encephalopathy, unspecified: Secondary | ICD-10-CM | POA: Diagnosis not present

## 2021-09-10 DIAGNOSIS — I639 Cerebral infarction, unspecified: Secondary | ICD-10-CM | POA: Diagnosis not present

## 2021-09-10 DIAGNOSIS — Z8673 Personal history of transient ischemic attack (TIA), and cerebral infarction without residual deficits: Secondary | ICD-10-CM | POA: Diagnosis not present

## 2021-09-10 DIAGNOSIS — N179 Acute kidney failure, unspecified: Secondary | ICD-10-CM | POA: Diagnosis not present

## 2021-09-10 DIAGNOSIS — D649 Anemia, unspecified: Secondary | ICD-10-CM | POA: Diagnosis not present

## 2021-09-10 DIAGNOSIS — I517 Cardiomegaly: Secondary | ICD-10-CM | POA: Diagnosis not present

## 2021-09-10 DIAGNOSIS — I161 Hypertensive emergency: Secondary | ICD-10-CM | POA: Diagnosis not present

## 2021-09-11 DIAGNOSIS — I10 Essential (primary) hypertension: Secondary | ICD-10-CM | POA: Diagnosis not present

## 2021-09-11 DIAGNOSIS — E1165 Type 2 diabetes mellitus with hyperglycemia: Secondary | ICD-10-CM | POA: Diagnosis not present

## 2021-09-11 DIAGNOSIS — G9340 Encephalopathy, unspecified: Secondary | ICD-10-CM | POA: Diagnosis not present

## 2021-09-11 DIAGNOSIS — I639 Cerebral infarction, unspecified: Secondary | ICD-10-CM | POA: Diagnosis not present

## 2021-09-11 DIAGNOSIS — Z8673 Personal history of transient ischemic attack (TIA), and cerebral infarction without residual deficits: Secondary | ICD-10-CM | POA: Diagnosis not present

## 2021-09-11 DIAGNOSIS — R4182 Altered mental status, unspecified: Secondary | ICD-10-CM | POA: Diagnosis not present

## 2021-09-11 DIAGNOSIS — I6523 Occlusion and stenosis of bilateral carotid arteries: Secondary | ICD-10-CM | POA: Diagnosis not present

## 2021-09-11 DIAGNOSIS — R69 Illness, unspecified: Secondary | ICD-10-CM | POA: Diagnosis not present

## 2021-09-11 DIAGNOSIS — E669 Obesity, unspecified: Secondary | ICD-10-CM | POA: Diagnosis not present

## 2021-09-11 DIAGNOSIS — J9601 Acute respiratory failure with hypoxia: Secondary | ICD-10-CM | POA: Diagnosis not present

## 2021-09-11 DIAGNOSIS — I161 Hypertensive emergency: Secondary | ICD-10-CM | POA: Diagnosis not present

## 2021-09-11 DIAGNOSIS — D649 Anemia, unspecified: Secondary | ICD-10-CM | POA: Diagnosis not present

## 2021-09-11 DIAGNOSIS — N179 Acute kidney failure, unspecified: Secondary | ICD-10-CM | POA: Diagnosis not present

## 2021-09-11 DIAGNOSIS — N182 Chronic kidney disease, stage 2 (mild): Secondary | ICD-10-CM | POA: Diagnosis not present

## 2021-09-12 DIAGNOSIS — I161 Hypertensive emergency: Secondary | ICD-10-CM | POA: Diagnosis not present

## 2021-09-12 DIAGNOSIS — Z7409 Other reduced mobility: Secondary | ICD-10-CM | POA: Diagnosis not present

## 2021-09-12 DIAGNOSIS — Z8673 Personal history of transient ischemic attack (TIA), and cerebral infarction without residual deficits: Secondary | ICD-10-CM | POA: Diagnosis not present

## 2021-09-12 DIAGNOSIS — I509 Heart failure, unspecified: Secondary | ICD-10-CM | POA: Diagnosis not present

## 2021-09-12 DIAGNOSIS — Z6841 Body Mass Index (BMI) 40.0 and over, adult: Secondary | ICD-10-CM | POA: Diagnosis not present

## 2021-09-12 DIAGNOSIS — N179 Acute kidney failure, unspecified: Secondary | ICD-10-CM | POA: Diagnosis not present

## 2021-09-12 DIAGNOSIS — I10 Essential (primary) hypertension: Secondary | ICD-10-CM | POA: Diagnosis not present

## 2021-09-12 DIAGNOSIS — I6523 Occlusion and stenosis of bilateral carotid arteries: Secondary | ICD-10-CM | POA: Diagnosis not present

## 2021-09-12 DIAGNOSIS — F32A Depression, unspecified: Secondary | ICD-10-CM | POA: Diagnosis not present

## 2021-09-12 DIAGNOSIS — E1122 Type 2 diabetes mellitus with diabetic chronic kidney disease: Secondary | ICD-10-CM | POA: Diagnosis not present

## 2021-09-12 DIAGNOSIS — N182 Chronic kidney disease, stage 2 (mild): Secondary | ICD-10-CM | POA: Diagnosis not present

## 2021-09-12 DIAGNOSIS — R413 Other amnesia: Secondary | ICD-10-CM | POA: Diagnosis not present

## 2021-09-12 DIAGNOSIS — E669 Obesity, unspecified: Secondary | ICD-10-CM | POA: Diagnosis not present

## 2021-09-12 DIAGNOSIS — B9620 Unspecified Escherichia coli [E. coli] as the cause of diseases classified elsewhere: Secondary | ICD-10-CM | POA: Diagnosis not present

## 2021-09-12 DIAGNOSIS — J9601 Acute respiratory failure with hypoxia: Secondary | ICD-10-CM | POA: Diagnosis not present

## 2021-09-12 DIAGNOSIS — F419 Anxiety disorder, unspecified: Secondary | ICD-10-CM | POA: Diagnosis not present

## 2021-09-12 DIAGNOSIS — R4182 Altered mental status, unspecified: Secondary | ICD-10-CM | POA: Diagnosis not present

## 2021-09-12 DIAGNOSIS — G9341 Metabolic encephalopathy: Secondary | ICD-10-CM | POA: Diagnosis not present

## 2021-09-12 DIAGNOSIS — D638 Anemia in other chronic diseases classified elsewhere: Secondary | ICD-10-CM | POA: Diagnosis not present

## 2021-09-12 DIAGNOSIS — I130 Hypertensive heart and chronic kidney disease with heart failure and stage 1 through stage 4 chronic kidney disease, or unspecified chronic kidney disease: Secondary | ICD-10-CM | POA: Diagnosis not present

## 2021-09-12 DIAGNOSIS — G4733 Obstructive sleep apnea (adult) (pediatric): Secondary | ICD-10-CM | POA: Diagnosis not present

## 2021-09-12 DIAGNOSIS — E1165 Type 2 diabetes mellitus with hyperglycemia: Secondary | ICD-10-CM | POA: Diagnosis not present

## 2021-09-12 DIAGNOSIS — N3001 Acute cystitis with hematuria: Secondary | ICD-10-CM | POA: Diagnosis not present

## 2021-09-13 DIAGNOSIS — R4182 Altered mental status, unspecified: Secondary | ICD-10-CM | POA: Diagnosis not present

## 2021-09-13 DIAGNOSIS — I161 Hypertensive emergency: Secondary | ICD-10-CM | POA: Diagnosis not present

## 2021-09-13 DIAGNOSIS — N179 Acute kidney failure, unspecified: Secondary | ICD-10-CM | POA: Diagnosis not present

## 2021-09-13 DIAGNOSIS — J9601 Acute respiratory failure with hypoxia: Secondary | ICD-10-CM | POA: Diagnosis not present

## 2021-09-14 DIAGNOSIS — I10 Essential (primary) hypertension: Secondary | ICD-10-CM | POA: Diagnosis not present

## 2021-09-14 DIAGNOSIS — N182 Chronic kidney disease, stage 2 (mild): Secondary | ICD-10-CM | POA: Diagnosis not present

## 2021-09-14 DIAGNOSIS — N179 Acute kidney failure, unspecified: Secondary | ICD-10-CM | POA: Diagnosis not present

## 2021-09-14 DIAGNOSIS — I161 Hypertensive emergency: Secondary | ICD-10-CM | POA: Diagnosis not present

## 2021-09-14 DIAGNOSIS — E1165 Type 2 diabetes mellitus with hyperglycemia: Secondary | ICD-10-CM | POA: Diagnosis not present

## 2021-09-14 DIAGNOSIS — E669 Obesity, unspecified: Secondary | ICD-10-CM | POA: Diagnosis not present

## 2021-09-14 DIAGNOSIS — Z8673 Personal history of transient ischemic attack (TIA), and cerebral infarction without residual deficits: Secondary | ICD-10-CM | POA: Diagnosis not present

## 2021-09-15 DIAGNOSIS — N182 Chronic kidney disease, stage 2 (mild): Secondary | ICD-10-CM | POA: Diagnosis not present

## 2021-09-15 DIAGNOSIS — E669 Obesity, unspecified: Secondary | ICD-10-CM | POA: Diagnosis not present

## 2021-09-15 DIAGNOSIS — E1165 Type 2 diabetes mellitus with hyperglycemia: Secondary | ICD-10-CM | POA: Diagnosis not present

## 2021-09-15 DIAGNOSIS — I10 Essential (primary) hypertension: Secondary | ICD-10-CM | POA: Diagnosis not present

## 2021-09-15 DIAGNOSIS — Z8673 Personal history of transient ischemic attack (TIA), and cerebral infarction without residual deficits: Secondary | ICD-10-CM | POA: Diagnosis not present

## 2021-09-15 DIAGNOSIS — N179 Acute kidney failure, unspecified: Secondary | ICD-10-CM | POA: Diagnosis not present

## 2021-09-15 DIAGNOSIS — I161 Hypertensive emergency: Secondary | ICD-10-CM | POA: Diagnosis not present

## 2021-09-16 DIAGNOSIS — Z8673 Personal history of transient ischemic attack (TIA), and cerebral infarction without residual deficits: Secondary | ICD-10-CM | POA: Diagnosis not present

## 2021-09-17 DIAGNOSIS — Z6841 Body Mass Index (BMI) 40.0 and over, adult: Secondary | ICD-10-CM | POA: Diagnosis not present

## 2021-09-17 DIAGNOSIS — Z743 Need for continuous supervision: Secondary | ICD-10-CM | POA: Diagnosis not present

## 2021-09-17 DIAGNOSIS — Z7409 Other reduced mobility: Secondary | ICD-10-CM | POA: Diagnosis not present

## 2021-09-17 DIAGNOSIS — N183 Chronic kidney disease, stage 3 unspecified: Secondary | ICD-10-CM | POA: Diagnosis not present

## 2021-09-17 DIAGNOSIS — E1165 Type 2 diabetes mellitus with hyperglycemia: Secondary | ICD-10-CM | POA: Diagnosis not present

## 2021-09-17 DIAGNOSIS — I69318 Other symptoms and signs involving cognitive functions following cerebral infarction: Secondary | ICD-10-CM | POA: Diagnosis not present

## 2021-09-17 DIAGNOSIS — G459 Transient cerebral ischemic attack, unspecified: Secondary | ICD-10-CM | POA: Diagnosis not present

## 2021-09-17 DIAGNOSIS — R2689 Other abnormalities of gait and mobility: Secondary | ICD-10-CM | POA: Diagnosis not present

## 2021-09-17 DIAGNOSIS — I10 Essential (primary) hypertension: Secondary | ICD-10-CM | POA: Diagnosis not present

## 2021-09-17 DIAGNOSIS — Z8673 Personal history of transient ischemic attack (TIA), and cerebral infarction without residual deficits: Secondary | ICD-10-CM | POA: Diagnosis not present

## 2021-09-17 DIAGNOSIS — R5383 Other fatigue: Secondary | ICD-10-CM | POA: Diagnosis not present

## 2021-09-17 DIAGNOSIS — I161 Hypertensive emergency: Secondary | ICD-10-CM | POA: Diagnosis not present

## 2021-09-17 DIAGNOSIS — R531 Weakness: Secondary | ICD-10-CM | POA: Diagnosis not present

## 2021-09-17 DIAGNOSIS — I663 Occlusion and stenosis of cerebellar arteries: Secondary | ICD-10-CM | POA: Diagnosis not present

## 2021-09-17 DIAGNOSIS — D649 Anemia, unspecified: Secondary | ICD-10-CM | POA: Diagnosis not present

## 2021-09-17 DIAGNOSIS — I129 Hypertensive chronic kidney disease with stage 1 through stage 4 chronic kidney disease, or unspecified chronic kidney disease: Secondary | ICD-10-CM | POA: Diagnosis not present

## 2021-09-17 DIAGNOSIS — R41840 Attention and concentration deficit: Secondary | ICD-10-CM | POA: Diagnosis not present

## 2021-09-17 DIAGNOSIS — G9341 Metabolic encephalopathy: Secondary | ICD-10-CM | POA: Diagnosis not present

## 2021-09-17 DIAGNOSIS — N179 Acute kidney failure, unspecified: Secondary | ICD-10-CM | POA: Diagnosis not present

## 2021-09-17 DIAGNOSIS — R69 Illness, unspecified: Secondary | ICD-10-CM | POA: Diagnosis not present

## 2021-09-17 DIAGNOSIS — I69398 Other sequelae of cerebral infarction: Secondary | ICD-10-CM | POA: Diagnosis not present

## 2021-09-17 DIAGNOSIS — K5900 Constipation, unspecified: Secondary | ICD-10-CM | POA: Diagnosis not present

## 2021-09-17 DIAGNOSIS — I69393 Ataxia following cerebral infarction: Secondary | ICD-10-CM | POA: Diagnosis not present

## 2021-09-17 DIAGNOSIS — N184 Chronic kidney disease, stage 4 (severe): Secondary | ICD-10-CM | POA: Diagnosis not present

## 2021-09-18 DIAGNOSIS — Z7409 Other reduced mobility: Secondary | ICD-10-CM | POA: Diagnosis not present

## 2021-09-18 DIAGNOSIS — G9341 Metabolic encephalopathy: Secondary | ICD-10-CM | POA: Diagnosis not present

## 2021-09-18 DIAGNOSIS — I161 Hypertensive emergency: Secondary | ICD-10-CM | POA: Diagnosis not present

## 2021-09-18 DIAGNOSIS — I663 Occlusion and stenosis of cerebellar arteries: Secondary | ICD-10-CM | POA: Diagnosis not present

## 2021-09-18 DIAGNOSIS — R41840 Attention and concentration deficit: Secondary | ICD-10-CM | POA: Diagnosis not present

## 2021-09-18 DIAGNOSIS — N179 Acute kidney failure, unspecified: Secondary | ICD-10-CM | POA: Diagnosis not present

## 2021-09-18 DIAGNOSIS — N184 Chronic kidney disease, stage 4 (severe): Secondary | ICD-10-CM | POA: Diagnosis not present

## 2021-09-18 DIAGNOSIS — I10 Essential (primary) hypertension: Secondary | ICD-10-CM | POA: Diagnosis not present

## 2021-09-19 DIAGNOSIS — R41840 Attention and concentration deficit: Secondary | ICD-10-CM | POA: Diagnosis not present

## 2021-09-19 DIAGNOSIS — G9341 Metabolic encephalopathy: Secondary | ICD-10-CM | POA: Diagnosis not present

## 2021-09-19 DIAGNOSIS — I10 Essential (primary) hypertension: Secondary | ICD-10-CM | POA: Diagnosis not present

## 2021-09-19 DIAGNOSIS — Z7409 Other reduced mobility: Secondary | ICD-10-CM | POA: Diagnosis not present

## 2021-09-19 DIAGNOSIS — I663 Occlusion and stenosis of cerebellar arteries: Secondary | ICD-10-CM | POA: Diagnosis not present

## 2021-09-20 DIAGNOSIS — N184 Chronic kidney disease, stage 4 (severe): Secondary | ICD-10-CM | POA: Diagnosis not present

## 2021-09-20 DIAGNOSIS — I663 Occlusion and stenosis of cerebellar arteries: Secondary | ICD-10-CM | POA: Diagnosis not present

## 2021-09-20 DIAGNOSIS — R41840 Attention and concentration deficit: Secondary | ICD-10-CM | POA: Diagnosis not present

## 2021-09-20 DIAGNOSIS — Z8673 Personal history of transient ischemic attack (TIA), and cerebral infarction without residual deficits: Secondary | ICD-10-CM | POA: Diagnosis not present

## 2021-09-20 DIAGNOSIS — I161 Hypertensive emergency: Secondary | ICD-10-CM | POA: Diagnosis not present

## 2021-09-20 DIAGNOSIS — G9341 Metabolic encephalopathy: Secondary | ICD-10-CM | POA: Diagnosis not present

## 2021-09-20 DIAGNOSIS — I10 Essential (primary) hypertension: Secondary | ICD-10-CM | POA: Diagnosis not present

## 2021-09-20 DIAGNOSIS — Z7409 Other reduced mobility: Secondary | ICD-10-CM | POA: Diagnosis not present

## 2021-09-21 DIAGNOSIS — R41840 Attention and concentration deficit: Secondary | ICD-10-CM | POA: Diagnosis not present

## 2021-09-21 DIAGNOSIS — G9341 Metabolic encephalopathy: Secondary | ICD-10-CM | POA: Diagnosis not present

## 2021-09-21 DIAGNOSIS — Z7409 Other reduced mobility: Secondary | ICD-10-CM | POA: Diagnosis not present

## 2021-09-21 DIAGNOSIS — I663 Occlusion and stenosis of cerebellar arteries: Secondary | ICD-10-CM | POA: Diagnosis not present

## 2021-09-21 DIAGNOSIS — I10 Essential (primary) hypertension: Secondary | ICD-10-CM | POA: Diagnosis not present

## 2021-09-22 DIAGNOSIS — G9341 Metabolic encephalopathy: Secondary | ICD-10-CM | POA: Diagnosis not present

## 2021-09-22 DIAGNOSIS — N184 Chronic kidney disease, stage 4 (severe): Secondary | ICD-10-CM | POA: Diagnosis not present

## 2021-09-22 DIAGNOSIS — I663 Occlusion and stenosis of cerebellar arteries: Secondary | ICD-10-CM | POA: Diagnosis not present

## 2021-09-22 DIAGNOSIS — I10 Essential (primary) hypertension: Secondary | ICD-10-CM | POA: Diagnosis not present

## 2021-09-22 DIAGNOSIS — Z7409 Other reduced mobility: Secondary | ICD-10-CM | POA: Diagnosis not present

## 2021-09-22 DIAGNOSIS — Z8673 Personal history of transient ischemic attack (TIA), and cerebral infarction without residual deficits: Secondary | ICD-10-CM | POA: Diagnosis not present

## 2021-09-22 DIAGNOSIS — R41840 Attention and concentration deficit: Secondary | ICD-10-CM | POA: Diagnosis not present

## 2021-09-23 DIAGNOSIS — Z7409 Other reduced mobility: Secondary | ICD-10-CM | POA: Diagnosis not present

## 2021-09-23 DIAGNOSIS — I10 Essential (primary) hypertension: Secondary | ICD-10-CM | POA: Diagnosis not present

## 2021-09-23 DIAGNOSIS — G9341 Metabolic encephalopathy: Secondary | ICD-10-CM | POA: Diagnosis not present

## 2021-09-23 DIAGNOSIS — R41840 Attention and concentration deficit: Secondary | ICD-10-CM | POA: Diagnosis not present

## 2021-09-23 DIAGNOSIS — I663 Occlusion and stenosis of cerebellar arteries: Secondary | ICD-10-CM | POA: Diagnosis not present

## 2021-09-24 DIAGNOSIS — Z7409 Other reduced mobility: Secondary | ICD-10-CM | POA: Diagnosis not present

## 2021-09-24 DIAGNOSIS — G9341 Metabolic encephalopathy: Secondary | ICD-10-CM | POA: Diagnosis not present

## 2021-09-24 DIAGNOSIS — I10 Essential (primary) hypertension: Secondary | ICD-10-CM | POA: Diagnosis not present

## 2021-09-24 DIAGNOSIS — I663 Occlusion and stenosis of cerebellar arteries: Secondary | ICD-10-CM | POA: Diagnosis not present

## 2021-09-24 DIAGNOSIS — R41840 Attention and concentration deficit: Secondary | ICD-10-CM | POA: Diagnosis not present

## 2021-09-25 DIAGNOSIS — N184 Chronic kidney disease, stage 4 (severe): Secondary | ICD-10-CM | POA: Diagnosis not present

## 2021-09-25 DIAGNOSIS — R41840 Attention and concentration deficit: Secondary | ICD-10-CM | POA: Diagnosis not present

## 2021-09-25 DIAGNOSIS — Z7409 Other reduced mobility: Secondary | ICD-10-CM | POA: Diagnosis not present

## 2021-09-25 DIAGNOSIS — Z8673 Personal history of transient ischemic attack (TIA), and cerebral infarction without residual deficits: Secondary | ICD-10-CM | POA: Diagnosis not present

## 2021-09-25 DIAGNOSIS — I663 Occlusion and stenosis of cerebellar arteries: Secondary | ICD-10-CM | POA: Diagnosis not present

## 2021-09-25 DIAGNOSIS — K5900 Constipation, unspecified: Secondary | ICD-10-CM | POA: Diagnosis not present

## 2021-09-25 DIAGNOSIS — I10 Essential (primary) hypertension: Secondary | ICD-10-CM | POA: Diagnosis not present

## 2021-09-25 DIAGNOSIS — G9341 Metabolic encephalopathy: Secondary | ICD-10-CM | POA: Diagnosis not present

## 2021-09-26 DIAGNOSIS — R2689 Other abnormalities of gait and mobility: Secondary | ICD-10-CM | POA: Diagnosis not present

## 2021-09-26 DIAGNOSIS — G9341 Metabolic encephalopathy: Secondary | ICD-10-CM | POA: Diagnosis not present

## 2021-09-26 DIAGNOSIS — D649 Anemia, unspecified: Secondary | ICD-10-CM | POA: Diagnosis not present

## 2021-09-26 DIAGNOSIS — I69398 Other sequelae of cerebral infarction: Secondary | ICD-10-CM | POA: Diagnosis not present

## 2021-09-27 DIAGNOSIS — Z8673 Personal history of transient ischemic attack (TIA), and cerebral infarction without residual deficits: Secondary | ICD-10-CM | POA: Diagnosis not present

## 2021-09-27 DIAGNOSIS — G9341 Metabolic encephalopathy: Secondary | ICD-10-CM | POA: Diagnosis not present

## 2021-09-27 DIAGNOSIS — K5900 Constipation, unspecified: Secondary | ICD-10-CM | POA: Diagnosis not present

## 2021-09-27 DIAGNOSIS — D649 Anemia, unspecified: Secondary | ICD-10-CM | POA: Diagnosis not present

## 2021-09-27 DIAGNOSIS — I69398 Other sequelae of cerebral infarction: Secondary | ICD-10-CM | POA: Diagnosis not present

## 2021-09-27 DIAGNOSIS — N184 Chronic kidney disease, stage 4 (severe): Secondary | ICD-10-CM | POA: Diagnosis not present

## 2021-09-27 DIAGNOSIS — R2689 Other abnormalities of gait and mobility: Secondary | ICD-10-CM | POA: Diagnosis not present

## 2021-09-27 DIAGNOSIS — I10 Essential (primary) hypertension: Secondary | ICD-10-CM | POA: Diagnosis not present

## 2021-09-28 DIAGNOSIS — R41840 Attention and concentration deficit: Secondary | ICD-10-CM | POA: Diagnosis not present

## 2021-09-28 DIAGNOSIS — Z7409 Other reduced mobility: Secondary | ICD-10-CM | POA: Diagnosis not present

## 2021-09-28 DIAGNOSIS — I10 Essential (primary) hypertension: Secondary | ICD-10-CM | POA: Diagnosis not present

## 2021-09-28 DIAGNOSIS — I663 Occlusion and stenosis of cerebellar arteries: Secondary | ICD-10-CM | POA: Diagnosis not present

## 2021-09-28 DIAGNOSIS — G9341 Metabolic encephalopathy: Secondary | ICD-10-CM | POA: Diagnosis not present

## 2021-09-29 DIAGNOSIS — R41840 Attention and concentration deficit: Secondary | ICD-10-CM | POA: Diagnosis not present

## 2021-09-29 DIAGNOSIS — G9341 Metabolic encephalopathy: Secondary | ICD-10-CM | POA: Diagnosis not present

## 2021-09-29 DIAGNOSIS — Z7409 Other reduced mobility: Secondary | ICD-10-CM | POA: Diagnosis not present

## 2021-09-29 DIAGNOSIS — I10 Essential (primary) hypertension: Secondary | ICD-10-CM | POA: Diagnosis not present

## 2021-09-29 DIAGNOSIS — I663 Occlusion and stenosis of cerebellar arteries: Secondary | ICD-10-CM | POA: Diagnosis not present

## 2021-09-30 DIAGNOSIS — Z7409 Other reduced mobility: Secondary | ICD-10-CM | POA: Diagnosis not present

## 2021-09-30 DIAGNOSIS — I10 Essential (primary) hypertension: Secondary | ICD-10-CM | POA: Diagnosis not present

## 2021-09-30 DIAGNOSIS — R41840 Attention and concentration deficit: Secondary | ICD-10-CM | POA: Diagnosis not present

## 2021-09-30 DIAGNOSIS — G9341 Metabolic encephalopathy: Secondary | ICD-10-CM | POA: Diagnosis not present

## 2021-09-30 DIAGNOSIS — I663 Occlusion and stenosis of cerebellar arteries: Secondary | ICD-10-CM | POA: Diagnosis not present

## 2021-10-01 DIAGNOSIS — I69398 Other sequelae of cerebral infarction: Secondary | ICD-10-CM | POA: Diagnosis not present

## 2021-10-01 DIAGNOSIS — I10 Essential (primary) hypertension: Secondary | ICD-10-CM | POA: Diagnosis not present

## 2021-10-01 DIAGNOSIS — R41840 Attention and concentration deficit: Secondary | ICD-10-CM | POA: Diagnosis not present

## 2021-10-01 DIAGNOSIS — G9341 Metabolic encephalopathy: Secondary | ICD-10-CM | POA: Diagnosis not present

## 2021-10-01 DIAGNOSIS — Z7409 Other reduced mobility: Secondary | ICD-10-CM | POA: Diagnosis not present

## 2021-10-01 DIAGNOSIS — I663 Occlusion and stenosis of cerebellar arteries: Secondary | ICD-10-CM | POA: Diagnosis not present

## 2021-10-08 DIAGNOSIS — G9340 Encephalopathy, unspecified: Secondary | ICD-10-CM | POA: Diagnosis not present

## 2021-10-08 DIAGNOSIS — I10 Essential (primary) hypertension: Secondary | ICD-10-CM | POA: Diagnosis not present

## 2021-10-08 DIAGNOSIS — E785 Hyperlipidemia, unspecified: Secondary | ICD-10-CM | POA: Diagnosis not present

## 2021-10-08 DIAGNOSIS — E860 Dehydration: Secondary | ICD-10-CM | POA: Diagnosis not present

## 2021-10-08 DIAGNOSIS — E870 Hyperosmolality and hypernatremia: Secondary | ICD-10-CM | POA: Diagnosis not present

## 2021-10-08 DIAGNOSIS — Z6841 Body Mass Index (BMI) 40.0 and over, adult: Secondary | ICD-10-CM | POA: Diagnosis not present

## 2021-10-08 DIAGNOSIS — Z7982 Long term (current) use of aspirin: Secondary | ICD-10-CM | POA: Diagnosis not present

## 2021-10-08 DIAGNOSIS — R9082 White matter disease, unspecified: Secondary | ICD-10-CM | POA: Diagnosis not present

## 2021-10-08 DIAGNOSIS — G319 Degenerative disease of nervous system, unspecified: Secondary | ICD-10-CM | POA: Diagnosis not present

## 2021-10-08 DIAGNOSIS — I129 Hypertensive chronic kidney disease with stage 1 through stage 4 chronic kidney disease, or unspecified chronic kidney disease: Secondary | ICD-10-CM | POA: Diagnosis not present

## 2021-10-08 DIAGNOSIS — Z79899 Other long term (current) drug therapy: Secondary | ICD-10-CM | POA: Diagnosis not present

## 2021-10-08 DIAGNOSIS — I161 Hypertensive emergency: Secondary | ICD-10-CM | POA: Diagnosis not present

## 2021-10-08 DIAGNOSIS — R509 Fever, unspecified: Secondary | ICD-10-CM | POA: Diagnosis not present

## 2021-10-08 DIAGNOSIS — Z6839 Body mass index (BMI) 39.0-39.9, adult: Secondary | ICD-10-CM | POA: Diagnosis not present

## 2021-10-08 DIAGNOSIS — R000 Tachycardia, unspecified: Secondary | ICD-10-CM | POA: Diagnosis not present

## 2021-10-08 DIAGNOSIS — D631 Anemia in chronic kidney disease: Secondary | ICD-10-CM | POA: Diagnosis not present

## 2021-10-08 DIAGNOSIS — R410 Disorientation, unspecified: Secondary | ICD-10-CM | POA: Diagnosis not present

## 2021-10-08 DIAGNOSIS — Z13818 Encounter for screening for other digestive system disorders: Secondary | ICD-10-CM | POA: Diagnosis not present

## 2021-10-08 DIAGNOSIS — E669 Obesity, unspecified: Secondary | ICD-10-CM | POA: Diagnosis not present

## 2021-10-08 DIAGNOSIS — R69 Illness, unspecified: Secondary | ICD-10-CM | POA: Diagnosis not present

## 2021-10-08 DIAGNOSIS — Z8669 Personal history of other diseases of the nervous system and sense organs: Secondary | ICD-10-CM | POA: Diagnosis not present

## 2021-10-08 DIAGNOSIS — I674 Hypertensive encephalopathy: Secondary | ICD-10-CM | POA: Diagnosis not present

## 2021-10-08 DIAGNOSIS — Z8673 Personal history of transient ischemic attack (TIA), and cerebral infarction without residual deficits: Secondary | ICD-10-CM | POA: Diagnosis not present

## 2021-10-08 DIAGNOSIS — N179 Acute kidney failure, unspecified: Secondary | ICD-10-CM | POA: Diagnosis not present

## 2021-10-08 DIAGNOSIS — I6782 Cerebral ischemia: Secondary | ICD-10-CM | POA: Diagnosis not present

## 2021-10-08 DIAGNOSIS — E1165 Type 2 diabetes mellitus with hyperglycemia: Secondary | ICD-10-CM | POA: Diagnosis not present

## 2021-10-08 DIAGNOSIS — N184 Chronic kidney disease, stage 4 (severe): Secondary | ICD-10-CM | POA: Diagnosis not present

## 2021-10-08 DIAGNOSIS — E1122 Type 2 diabetes mellitus with diabetic chronic kidney disease: Secondary | ICD-10-CM | POA: Diagnosis not present

## 2021-10-08 DIAGNOSIS — I160 Hypertensive urgency: Secondary | ICD-10-CM | POA: Diagnosis not present

## 2021-10-08 DIAGNOSIS — R4182 Altered mental status, unspecified: Secondary | ICD-10-CM | POA: Diagnosis not present

## 2021-10-08 DIAGNOSIS — I517 Cardiomegaly: Secondary | ICD-10-CM | POA: Diagnosis not present

## 2021-10-12 DIAGNOSIS — G9340 Encephalopathy, unspecified: Secondary | ICD-10-CM | POA: Diagnosis not present

## 2021-10-15 DIAGNOSIS — D631 Anemia in chronic kidney disease: Secondary | ICD-10-CM | POA: Diagnosis not present

## 2021-10-15 DIAGNOSIS — I69391 Dysphagia following cerebral infarction: Secondary | ICD-10-CM | POA: Diagnosis not present

## 2021-10-15 DIAGNOSIS — E1165 Type 2 diabetes mellitus with hyperglycemia: Secondary | ICD-10-CM | POA: Diagnosis not present

## 2021-10-15 DIAGNOSIS — W0110XD Fall on same level from slipping, tripping and stumbling with subsequent striking against unspecified object, subsequent encounter: Secondary | ICD-10-CM | POA: Diagnosis not present

## 2021-10-15 DIAGNOSIS — R0689 Other abnormalities of breathing: Secondary | ICD-10-CM | POA: Diagnosis not present

## 2021-10-15 DIAGNOSIS — R2689 Other abnormalities of gait and mobility: Secondary | ICD-10-CM | POA: Diagnosis not present

## 2021-10-15 DIAGNOSIS — R54 Age-related physical debility: Secondary | ICD-10-CM | POA: Diagnosis not present

## 2021-10-15 DIAGNOSIS — N184 Chronic kidney disease, stage 4 (severe): Secondary | ICD-10-CM | POA: Diagnosis not present

## 2021-10-15 DIAGNOSIS — I63412 Cerebral infarction due to embolism of left middle cerebral artery: Secondary | ICD-10-CM | POA: Diagnosis not present

## 2021-10-15 DIAGNOSIS — Z743 Need for continuous supervision: Secondary | ICD-10-CM | POA: Diagnosis not present

## 2021-10-15 DIAGNOSIS — I2510 Atherosclerotic heart disease of native coronary artery without angina pectoris: Secondary | ICD-10-CM | POA: Diagnosis not present

## 2021-10-15 DIAGNOSIS — R809 Proteinuria, unspecified: Secondary | ICD-10-CM | POA: Diagnosis not present

## 2021-10-15 DIAGNOSIS — G9341 Metabolic encephalopathy: Secondary | ICD-10-CM | POA: Diagnosis not present

## 2021-10-15 DIAGNOSIS — I69320 Aphasia following cerebral infarction: Secondary | ICD-10-CM | POA: Diagnosis not present

## 2021-10-15 DIAGNOSIS — R260 Ataxic gait: Secondary | ICD-10-CM | POA: Diagnosis not present

## 2021-10-15 DIAGNOSIS — I69851 Hemiplegia and hemiparesis following other cerebrovascular disease affecting right dominant side: Secondary | ICD-10-CM | POA: Diagnosis not present

## 2021-10-15 DIAGNOSIS — F331 Major depressive disorder, recurrent, moderate: Secondary | ICD-10-CM | POA: Diagnosis not present

## 2021-10-15 DIAGNOSIS — I679 Cerebrovascular disease, unspecified: Secondary | ICD-10-CM | POA: Diagnosis not present

## 2021-10-15 DIAGNOSIS — E870 Hyperosmolality and hypernatremia: Secondary | ICD-10-CM | POA: Diagnosis not present

## 2021-10-15 DIAGNOSIS — R2681 Unsteadiness on feet: Secondary | ICD-10-CM | POA: Diagnosis not present

## 2021-10-15 DIAGNOSIS — M6281 Muscle weakness (generalized): Secondary | ICD-10-CM | POA: Diagnosis not present

## 2021-10-15 DIAGNOSIS — I10 Essential (primary) hypertension: Secondary | ICD-10-CM | POA: Diagnosis not present

## 2021-10-15 DIAGNOSIS — E119 Type 2 diabetes mellitus without complications: Secondary | ICD-10-CM | POA: Diagnosis not present

## 2021-10-15 DIAGNOSIS — E7849 Other hyperlipidemia: Secondary | ICD-10-CM | POA: Diagnosis not present

## 2021-10-15 DIAGNOSIS — N189 Chronic kidney disease, unspecified: Secondary | ICD-10-CM | POA: Diagnosis not present

## 2021-10-15 DIAGNOSIS — I6789 Other cerebrovascular disease: Secondary | ICD-10-CM | POA: Diagnosis not present

## 2021-10-15 DIAGNOSIS — I63411 Cerebral infarction due to embolism of right middle cerebral artery: Secondary | ICD-10-CM | POA: Diagnosis not present

## 2021-10-15 DIAGNOSIS — I160 Hypertensive urgency: Secondary | ICD-10-CM | POA: Diagnosis not present

## 2021-10-15 DIAGNOSIS — I6930 Unspecified sequelae of cerebral infarction: Secondary | ICD-10-CM | POA: Diagnosis not present

## 2021-10-16 DIAGNOSIS — E119 Type 2 diabetes mellitus without complications: Secondary | ICD-10-CM | POA: Diagnosis not present

## 2021-10-16 DIAGNOSIS — N184 Chronic kidney disease, stage 4 (severe): Secondary | ICD-10-CM | POA: Diagnosis not present

## 2021-10-16 DIAGNOSIS — I6789 Other cerebrovascular disease: Secondary | ICD-10-CM | POA: Diagnosis not present

## 2021-10-16 DIAGNOSIS — I10 Essential (primary) hypertension: Secondary | ICD-10-CM | POA: Diagnosis not present

## 2021-10-20 DIAGNOSIS — R260 Ataxic gait: Secondary | ICD-10-CM | POA: Diagnosis not present

## 2021-10-20 DIAGNOSIS — I10 Essential (primary) hypertension: Secondary | ICD-10-CM | POA: Diagnosis not present

## 2021-10-20 DIAGNOSIS — N189 Chronic kidney disease, unspecified: Secondary | ICD-10-CM | POA: Diagnosis not present

## 2021-10-20 DIAGNOSIS — I6789 Other cerebrovascular disease: Secondary | ICD-10-CM | POA: Diagnosis not present

## 2021-10-21 ENCOUNTER — Ambulatory Visit: Payer: Medicare HMO | Admitting: Physician Assistant

## 2021-10-21 NOTE — Progress Notes (Deleted)
Patient ID: Belinda Ramirez, female   DOB: May 11, 1975, 46 y.o.   MRN: [id number]    Active Hospital Problems  Diagnosis Date Noted POA   *Hypertensive crisis 09/09/2021 Yes   Hyperlipidemia 10/14/2021 Yes   CKD (chronic kidney disease) stage 4, GFR 15-29 ml/min (*) 09/09/2021 Yes   DM2 (diabetes mellitus, type 2) (*) 09/09/2021 Yes   Obesity 09/09/2021 Yes   History of CVA (cerebrovascular accident) 96/28/3662 Not Applicable   Resolved Hospital Problems  Diagnosis Date Noted Date Resolved POA   Essential hypertension 09/09/2021 10/12/2021 Yes   AKI (acute kidney injury) (*) 09/09/2021 10/12/2021 Yes   Acute encephalopathy 09/09/2021 10/12/2021 Yes   Current Discharge Medication List   CONTINUED medications  Details  amLODIPine besylate (NORVASC) 10 mg tablet Take one tablet (10 mg dose) by mouth daily.   aspirin 325 mg tablet Take one tablet (325 mg dose) by mouth daily.   atorvastatin (LIPITOR) 80 mg tablet Take one tablet (80 mg dose) by mouth daily.   cloNIDine (CATAPRES) 0.3 mg tablet Take one tablet (0.3 mg dose) by mouth 3 (three) times a day.   doxazosin mesylate (CARDURA) 1 mg tablet Take one tablet (1 mg dose) by mouth at bedtime.   escitalopram oxalate (LEXAPRO) 20 mg tablet Take one tablet (20 mg dose) by mouth daily.   hydrALAzine HCl (APRESOLINE) 100 mg tablet Take one tablet (100 mg dose) by mouth 3 (three) times a day.        Hospital Course   Hospital Course:  This is an addendum to to discharge summary done by Michel Bickers, NP, dated 10/14/2021. Please refer to the above-mentioned discharge summary for details.  Patient is clinically stable and unchanged.   She is ready to go back to his skilled nursing facility where she resides.  She could not go back to the skilled nursing facility on 10/14/2021 because transport could not be arranged.   I have checked with case management who have informed me that transport is available to pick the patient  up today so she will be leaving today.   Bedside Procedures   Critical Care  Electronically signed by: Ferne Reus, MD on 10/08/21 1523 Status: Completed  Ordering user: Ferne Reus, MD 10/08/21 1523 Ordering provider: Ferne Reus, MD    [facility]   Activity Instructions  Activity as tolerated  Driving Restrictions  Do not drive    Other Instructions  Ambulatory referral to Nephrology  Reason for referral: CKD follow up  Referral Type: Consultation  Specialty Services Required  Ambulatory referral to Neurology  Reason for referral: hospital follow up ?seizure  Referral Type: Consultation  Evaluate and Return  Follow-up with Primary Care Physician  Reason for referral: hospital follow up  Referral Type: Consultation  Evaluate and Return  Notify Physician and Call 911 if you experience any of the following: Sudden numbness or weakness, sudden trouble speaking, vision problems, trouble walking, sudden severe headache with no known cause.    Contact information for other follow-up providers  Follow-up with Primary Care Physician

## 2021-10-22 DIAGNOSIS — I6789 Other cerebrovascular disease: Secondary | ICD-10-CM | POA: Diagnosis not present

## 2021-10-22 DIAGNOSIS — R54 Age-related physical debility: Secondary | ICD-10-CM | POA: Diagnosis not present

## 2021-10-22 DIAGNOSIS — I10 Essential (primary) hypertension: Secondary | ICD-10-CM | POA: Diagnosis not present

## 2021-10-22 DIAGNOSIS — R260 Ataxic gait: Secondary | ICD-10-CM | POA: Diagnosis not present

## 2021-10-23 DIAGNOSIS — R54 Age-related physical debility: Secondary | ICD-10-CM | POA: Diagnosis not present

## 2021-10-23 DIAGNOSIS — I6789 Other cerebrovascular disease: Secondary | ICD-10-CM | POA: Diagnosis not present

## 2021-10-23 DIAGNOSIS — I10 Essential (primary) hypertension: Secondary | ICD-10-CM | POA: Diagnosis not present

## 2021-10-23 DIAGNOSIS — R260 Ataxic gait: Secondary | ICD-10-CM | POA: Diagnosis not present

## 2021-10-26 DIAGNOSIS — I10 Essential (primary) hypertension: Secondary | ICD-10-CM | POA: Diagnosis not present

## 2021-10-26 DIAGNOSIS — E119 Type 2 diabetes mellitus without complications: Secondary | ICD-10-CM | POA: Diagnosis not present

## 2021-10-26 DIAGNOSIS — I6789 Other cerebrovascular disease: Secondary | ICD-10-CM | POA: Diagnosis not present

## 2021-10-26 DIAGNOSIS — R260 Ataxic gait: Secondary | ICD-10-CM | POA: Diagnosis not present

## 2021-10-27 DIAGNOSIS — I10 Essential (primary) hypertension: Secondary | ICD-10-CM | POA: Diagnosis not present

## 2021-10-27 DIAGNOSIS — N189 Chronic kidney disease, unspecified: Secondary | ICD-10-CM | POA: Diagnosis not present

## 2021-10-29 DIAGNOSIS — E119 Type 2 diabetes mellitus without complications: Secondary | ICD-10-CM | POA: Diagnosis not present

## 2021-10-29 DIAGNOSIS — R260 Ataxic gait: Secondary | ICD-10-CM | POA: Diagnosis not present

## 2021-10-29 DIAGNOSIS — I6789 Other cerebrovascular disease: Secondary | ICD-10-CM | POA: Diagnosis not present

## 2021-10-29 DIAGNOSIS — I10 Essential (primary) hypertension: Secondary | ICD-10-CM | POA: Diagnosis not present

## 2021-11-10 DIAGNOSIS — I10 Essential (primary) hypertension: Secondary | ICD-10-CM | POA: Diagnosis not present

## 2021-11-10 DIAGNOSIS — I6789 Other cerebrovascular disease: Secondary | ICD-10-CM | POA: Diagnosis not present

## 2021-11-12 DIAGNOSIS — I10 Essential (primary) hypertension: Secondary | ICD-10-CM | POA: Diagnosis not present

## 2021-11-12 DIAGNOSIS — I679 Cerebrovascular disease, unspecified: Secondary | ICD-10-CM | POA: Diagnosis not present

## 2021-11-12 DIAGNOSIS — I6930 Unspecified sequelae of cerebral infarction: Secondary | ICD-10-CM | POA: Diagnosis not present

## 2021-11-19 DIAGNOSIS — R809 Proteinuria, unspecified: Secondary | ICD-10-CM | POA: Diagnosis not present

## 2021-11-24 DIAGNOSIS — E119 Type 2 diabetes mellitus without complications: Secondary | ICD-10-CM | POA: Diagnosis not present

## 2021-11-24 DIAGNOSIS — I2510 Atherosclerotic heart disease of native coronary artery without angina pectoris: Secondary | ICD-10-CM | POA: Diagnosis not present

## 2021-11-24 DIAGNOSIS — R260 Ataxic gait: Secondary | ICD-10-CM | POA: Diagnosis not present

## 2021-11-24 DIAGNOSIS — I6789 Other cerebrovascular disease: Secondary | ICD-10-CM | POA: Diagnosis not present

## 2021-11-25 DIAGNOSIS — K449 Diaphragmatic hernia without obstruction or gangrene: Secondary | ICD-10-CM | POA: Diagnosis not present

## 2021-11-25 DIAGNOSIS — I6783 Posterior reversible encephalopathy syndrome: Secondary | ICD-10-CM | POA: Diagnosis not present

## 2021-11-25 DIAGNOSIS — I7090 Unspecified atherosclerosis: Secondary | ICD-10-CM | POA: Diagnosis not present

## 2021-11-25 DIAGNOSIS — Z8673 Personal history of transient ischemic attack (TIA), and cerebral infarction without residual deficits: Secondary | ICD-10-CM | POA: Diagnosis not present

## 2021-11-25 DIAGNOSIS — Z9989 Dependence on other enabling machines and devices: Secondary | ICD-10-CM | POA: Diagnosis not present

## 2021-11-25 DIAGNOSIS — G9340 Encephalopathy, unspecified: Secondary | ICD-10-CM | POA: Diagnosis not present

## 2021-11-25 DIAGNOSIS — R569 Unspecified convulsions: Secondary | ICD-10-CM | POA: Diagnosis not present

## 2021-11-25 DIAGNOSIS — R69 Illness, unspecified: Secondary | ICD-10-CM | POA: Diagnosis not present

## 2021-11-25 DIAGNOSIS — H538 Other visual disturbances: Secondary | ICD-10-CM | POA: Diagnosis not present

## 2021-11-25 DIAGNOSIS — E1165 Type 2 diabetes mellitus with hyperglycemia: Secondary | ICD-10-CM | POA: Diagnosis not present

## 2021-11-25 DIAGNOSIS — I639 Cerebral infarction, unspecified: Secondary | ICD-10-CM | POA: Diagnosis not present

## 2021-11-25 DIAGNOSIS — Z7901 Long term (current) use of anticoagulants: Secondary | ICD-10-CM | POA: Diagnosis not present

## 2021-11-25 DIAGNOSIS — Z6837 Body mass index (BMI) 37.0-37.9, adult: Secondary | ICD-10-CM | POA: Diagnosis not present

## 2021-11-25 DIAGNOSIS — R9082 White matter disease, unspecified: Secondary | ICD-10-CM | POA: Diagnosis not present

## 2021-11-25 DIAGNOSIS — G319 Degenerative disease of nervous system, unspecified: Secondary | ICD-10-CM | POA: Diagnosis not present

## 2021-11-25 DIAGNOSIS — E871 Hypo-osmolality and hyponatremia: Secondary | ICD-10-CM | POA: Diagnosis not present

## 2021-11-25 DIAGNOSIS — Z7409 Other reduced mobility: Secondary | ICD-10-CM | POA: Diagnosis not present

## 2021-11-25 DIAGNOSIS — A419 Sepsis, unspecified organism: Secondary | ICD-10-CM | POA: Diagnosis not present

## 2021-11-25 DIAGNOSIS — E1122 Type 2 diabetes mellitus with diabetic chronic kidney disease: Secondary | ICD-10-CM | POA: Diagnosis not present

## 2021-11-25 DIAGNOSIS — R29818 Other symptoms and signs involving the nervous system: Secondary | ICD-10-CM | POA: Diagnosis not present

## 2021-11-25 DIAGNOSIS — Z79899 Other long term (current) drug therapy: Secondary | ICD-10-CM | POA: Diagnosis not present

## 2021-11-25 DIAGNOSIS — R531 Weakness: Secondary | ICD-10-CM | POA: Diagnosis not present

## 2021-11-25 DIAGNOSIS — J984 Other disorders of lung: Secondary | ICD-10-CM | POA: Diagnosis not present

## 2021-11-25 DIAGNOSIS — F39 Unspecified mood [affective] disorder: Secondary | ICD-10-CM | POA: Diagnosis not present

## 2021-11-25 DIAGNOSIS — E876 Hypokalemia: Secondary | ICD-10-CM | POA: Diagnosis not present

## 2021-11-25 DIAGNOSIS — N184 Chronic kidney disease, stage 4 (severe): Secondary | ICD-10-CM | POA: Diagnosis not present

## 2021-11-25 DIAGNOSIS — Z7982 Long term (current) use of aspirin: Secondary | ICD-10-CM | POA: Diagnosis not present

## 2021-11-25 DIAGNOSIS — I10 Essential (primary) hypertension: Secondary | ICD-10-CM | POA: Diagnosis not present

## 2021-11-25 DIAGNOSIS — Z9889 Other specified postprocedural states: Secondary | ICD-10-CM | POA: Diagnosis not present

## 2021-11-25 DIAGNOSIS — G9341 Metabolic encephalopathy: Secondary | ICD-10-CM | POA: Diagnosis not present

## 2021-11-25 DIAGNOSIS — I69351 Hemiplegia and hemiparesis following cerebral infarction affecting right dominant side: Secondary | ICD-10-CM | POA: Diagnosis not present

## 2021-11-25 DIAGNOSIS — R4182 Altered mental status, unspecified: Secondary | ICD-10-CM | POA: Diagnosis not present

## 2021-11-25 DIAGNOSIS — I169 Hypertensive crisis, unspecified: Secondary | ICD-10-CM | POA: Diagnosis not present

## 2021-11-25 DIAGNOSIS — Z6839 Body mass index (BMI) 39.0-39.9, adult: Secondary | ICD-10-CM | POA: Diagnosis not present

## 2021-11-25 DIAGNOSIS — G4733 Obstructive sleep apnea (adult) (pediatric): Secondary | ICD-10-CM | POA: Diagnosis not present

## 2021-11-25 DIAGNOSIS — I517 Cardiomegaly: Secondary | ICD-10-CM | POA: Diagnosis not present

## 2021-11-25 DIAGNOSIS — D649 Anemia, unspecified: Secondary | ICD-10-CM | POA: Diagnosis not present

## 2021-11-25 DIAGNOSIS — I161 Hypertensive emergency: Secondary | ICD-10-CM | POA: Diagnosis not present

## 2021-11-25 DIAGNOSIS — E785 Hyperlipidemia, unspecified: Secondary | ICD-10-CM | POA: Diagnosis not present

## 2021-11-25 DIAGNOSIS — D638 Anemia in other chronic diseases classified elsewhere: Secondary | ICD-10-CM | POA: Diagnosis not present

## 2021-11-25 DIAGNOSIS — I129 Hypertensive chronic kidney disease with stage 1 through stage 4 chronic kidney disease, or unspecified chronic kidney disease: Secondary | ICD-10-CM | POA: Diagnosis not present

## 2021-11-25 DIAGNOSIS — I3139 Other pericardial effusion (noninflammatory): Secondary | ICD-10-CM | POA: Diagnosis not present

## 2021-11-25 DIAGNOSIS — E669 Obesity, unspecified: Secondary | ICD-10-CM | POA: Diagnosis not present

## 2021-12-07 DIAGNOSIS — G4733 Obstructive sleep apnea (adult) (pediatric): Secondary | ICD-10-CM | POA: Diagnosis not present

## 2021-12-07 DIAGNOSIS — I639 Cerebral infarction, unspecified: Secondary | ICD-10-CM | POA: Diagnosis not present

## 2021-12-07 DIAGNOSIS — Z7982 Long term (current) use of aspirin: Secondary | ICD-10-CM | POA: Diagnosis not present

## 2021-12-07 DIAGNOSIS — E119 Type 2 diabetes mellitus without complications: Secondary | ICD-10-CM | POA: Diagnosis not present

## 2021-12-07 DIAGNOSIS — N189 Chronic kidney disease, unspecified: Secondary | ICD-10-CM | POA: Diagnosis not present

## 2021-12-07 DIAGNOSIS — G8191 Hemiplegia, unspecified affecting right dominant side: Secondary | ICD-10-CM | POA: Diagnosis not present

## 2021-12-07 DIAGNOSIS — R9431 Abnormal electrocardiogram [ECG] [EKG]: Secondary | ICD-10-CM | POA: Diagnosis not present

## 2021-12-07 DIAGNOSIS — N184 Chronic kidney disease, stage 4 (severe): Secondary | ICD-10-CM | POA: Diagnosis not present

## 2021-12-07 DIAGNOSIS — G9349 Other encephalopathy: Secondary | ICD-10-CM | POA: Diagnosis not present

## 2021-12-07 DIAGNOSIS — Z6838 Body mass index (BMI) 38.0-38.9, adult: Secondary | ICD-10-CM | POA: Diagnosis not present

## 2021-12-07 DIAGNOSIS — Z8673 Personal history of transient ischemic attack (TIA), and cerebral infarction without residual deficits: Secondary | ICD-10-CM | POA: Diagnosis not present

## 2021-12-07 DIAGNOSIS — I69318 Other symptoms and signs involving cognitive functions following cerebral infarction: Secondary | ICD-10-CM | POA: Diagnosis not present

## 2021-12-07 DIAGNOSIS — E785 Hyperlipidemia, unspecified: Secondary | ICD-10-CM | POA: Diagnosis not present

## 2021-12-07 DIAGNOSIS — E1165 Type 2 diabetes mellitus with hyperglycemia: Secondary | ICD-10-CM | POA: Diagnosis not present

## 2021-12-07 DIAGNOSIS — I69351 Hemiplegia and hemiparesis following cerebral infarction affecting right dominant side: Secondary | ICD-10-CM | POA: Diagnosis not present

## 2021-12-07 DIAGNOSIS — I129 Hypertensive chronic kidney disease with stage 1 through stage 4 chronic kidney disease, or unspecified chronic kidney disease: Secondary | ICD-10-CM | POA: Diagnosis not present

## 2021-12-07 DIAGNOSIS — I214 Non-ST elevation (NSTEMI) myocardial infarction: Secondary | ICD-10-CM | POA: Diagnosis not present

## 2021-12-07 DIAGNOSIS — N250 Renal osteodystrophy: Secondary | ICD-10-CM | POA: Diagnosis not present

## 2021-12-07 DIAGNOSIS — D649 Anemia, unspecified: Secondary | ICD-10-CM | POA: Diagnosis not present

## 2021-12-07 DIAGNOSIS — I169 Hypertensive crisis, unspecified: Secondary | ICD-10-CM | POA: Diagnosis not present

## 2021-12-07 DIAGNOSIS — R69 Illness, unspecified: Secondary | ICD-10-CM | POA: Diagnosis not present

## 2021-12-07 DIAGNOSIS — Z7409 Other reduced mobility: Secondary | ICD-10-CM | POA: Diagnosis not present

## 2021-12-07 DIAGNOSIS — D631 Anemia in chronic kidney disease: Secondary | ICD-10-CM | POA: Diagnosis not present

## 2021-12-07 DIAGNOSIS — I161 Hypertensive emergency: Secondary | ICD-10-CM | POA: Diagnosis not present

## 2021-12-07 DIAGNOSIS — Z7902 Long term (current) use of antithrombotics/antiplatelets: Secondary | ICD-10-CM | POA: Diagnosis not present

## 2021-12-07 DIAGNOSIS — I10 Essential (primary) hypertension: Secondary | ICD-10-CM | POA: Diagnosis not present

## 2021-12-07 DIAGNOSIS — E876 Hypokalemia: Secondary | ICD-10-CM | POA: Diagnosis not present

## 2021-12-07 DIAGNOSIS — N1832 Chronic kidney disease, stage 3b: Secondary | ICD-10-CM | POA: Diagnosis not present

## 2021-12-07 DIAGNOSIS — E1122 Type 2 diabetes mellitus with diabetic chronic kidney disease: Secondary | ICD-10-CM | POA: Diagnosis not present

## 2021-12-08 DIAGNOSIS — E119 Type 2 diabetes mellitus without complications: Secondary | ICD-10-CM | POA: Diagnosis not present

## 2021-12-08 DIAGNOSIS — G9349 Other encephalopathy: Secondary | ICD-10-CM | POA: Diagnosis not present

## 2021-12-08 DIAGNOSIS — D649 Anemia, unspecified: Secondary | ICD-10-CM | POA: Diagnosis not present

## 2021-12-08 DIAGNOSIS — E1165 Type 2 diabetes mellitus with hyperglycemia: Secondary | ICD-10-CM | POA: Diagnosis not present

## 2021-12-08 DIAGNOSIS — R69 Illness, unspecified: Secondary | ICD-10-CM | POA: Diagnosis not present

## 2021-12-08 DIAGNOSIS — N189 Chronic kidney disease, unspecified: Secondary | ICD-10-CM | POA: Diagnosis not present

## 2021-12-08 DIAGNOSIS — N250 Renal osteodystrophy: Secondary | ICD-10-CM | POA: Diagnosis not present

## 2021-12-08 DIAGNOSIS — N184 Chronic kidney disease, stage 4 (severe): Secondary | ICD-10-CM | POA: Diagnosis not present

## 2021-12-08 DIAGNOSIS — E876 Hypokalemia: Secondary | ICD-10-CM | POA: Diagnosis not present

## 2021-12-08 DIAGNOSIS — G4733 Obstructive sleep apnea (adult) (pediatric): Secondary | ICD-10-CM | POA: Diagnosis not present

## 2021-12-08 DIAGNOSIS — E785 Hyperlipidemia, unspecified: Secondary | ICD-10-CM | POA: Diagnosis not present

## 2021-12-08 DIAGNOSIS — Z7409 Other reduced mobility: Secondary | ICD-10-CM | POA: Diagnosis not present

## 2021-12-08 DIAGNOSIS — I10 Essential (primary) hypertension: Secondary | ICD-10-CM | POA: Diagnosis not present

## 2021-12-08 DIAGNOSIS — G8191 Hemiplegia, unspecified affecting right dominant side: Secondary | ICD-10-CM | POA: Diagnosis not present

## 2021-12-08 DIAGNOSIS — N1832 Chronic kidney disease, stage 3b: Secondary | ICD-10-CM | POA: Diagnosis not present

## 2021-12-08 DIAGNOSIS — I639 Cerebral infarction, unspecified: Secondary | ICD-10-CM | POA: Diagnosis not present

## 2021-12-08 DIAGNOSIS — D631 Anemia in chronic kidney disease: Secondary | ICD-10-CM | POA: Diagnosis not present

## 2021-12-09 DIAGNOSIS — Z7409 Other reduced mobility: Secondary | ICD-10-CM | POA: Diagnosis not present

## 2021-12-09 DIAGNOSIS — R69 Illness, unspecified: Secondary | ICD-10-CM | POA: Diagnosis not present

## 2021-12-09 DIAGNOSIS — I10 Essential (primary) hypertension: Secondary | ICD-10-CM | POA: Diagnosis not present

## 2021-12-09 DIAGNOSIS — D649 Anemia, unspecified: Secondary | ICD-10-CM | POA: Diagnosis not present

## 2021-12-09 DIAGNOSIS — N1832 Chronic kidney disease, stage 3b: Secondary | ICD-10-CM | POA: Diagnosis not present

## 2021-12-09 DIAGNOSIS — E785 Hyperlipidemia, unspecified: Secondary | ICD-10-CM | POA: Diagnosis not present

## 2021-12-09 DIAGNOSIS — I639 Cerebral infarction, unspecified: Secondary | ICD-10-CM | POA: Diagnosis not present

## 2021-12-09 DIAGNOSIS — E876 Hypokalemia: Secondary | ICD-10-CM | POA: Diagnosis not present

## 2021-12-09 DIAGNOSIS — N184 Chronic kidney disease, stage 4 (severe): Secondary | ICD-10-CM | POA: Diagnosis not present

## 2021-12-09 DIAGNOSIS — N250 Renal osteodystrophy: Secondary | ICD-10-CM | POA: Diagnosis not present

## 2021-12-09 DIAGNOSIS — G9349 Other encephalopathy: Secondary | ICD-10-CM | POA: Diagnosis not present

## 2021-12-09 DIAGNOSIS — E119 Type 2 diabetes mellitus without complications: Secondary | ICD-10-CM | POA: Diagnosis not present

## 2021-12-09 DIAGNOSIS — E1165 Type 2 diabetes mellitus with hyperglycemia: Secondary | ICD-10-CM | POA: Diagnosis not present

## 2021-12-09 DIAGNOSIS — G8191 Hemiplegia, unspecified affecting right dominant side: Secondary | ICD-10-CM | POA: Diagnosis not present

## 2021-12-09 DIAGNOSIS — G4733 Obstructive sleep apnea (adult) (pediatric): Secondary | ICD-10-CM | POA: Diagnosis not present

## 2021-12-09 DIAGNOSIS — D631 Anemia in chronic kidney disease: Secondary | ICD-10-CM | POA: Diagnosis not present

## 2021-12-09 DIAGNOSIS — N189 Chronic kidney disease, unspecified: Secondary | ICD-10-CM | POA: Diagnosis not present

## 2021-12-10 DIAGNOSIS — D649 Anemia, unspecified: Secondary | ICD-10-CM | POA: Diagnosis not present

## 2021-12-10 DIAGNOSIS — G4733 Obstructive sleep apnea (adult) (pediatric): Secondary | ICD-10-CM | POA: Diagnosis not present

## 2021-12-10 DIAGNOSIS — Z7409 Other reduced mobility: Secondary | ICD-10-CM | POA: Diagnosis not present

## 2021-12-10 DIAGNOSIS — D631 Anemia in chronic kidney disease: Secondary | ICD-10-CM | POA: Diagnosis not present

## 2021-12-10 DIAGNOSIS — I639 Cerebral infarction, unspecified: Secondary | ICD-10-CM | POA: Diagnosis not present

## 2021-12-10 DIAGNOSIS — I10 Essential (primary) hypertension: Secondary | ICD-10-CM | POA: Diagnosis not present

## 2021-12-10 DIAGNOSIS — E876 Hypokalemia: Secondary | ICD-10-CM | POA: Diagnosis not present

## 2021-12-10 DIAGNOSIS — E119 Type 2 diabetes mellitus without complications: Secondary | ICD-10-CM | POA: Diagnosis not present

## 2021-12-10 DIAGNOSIS — N189 Chronic kidney disease, unspecified: Secondary | ICD-10-CM | POA: Diagnosis not present

## 2021-12-10 DIAGNOSIS — G8191 Hemiplegia, unspecified affecting right dominant side: Secondary | ICD-10-CM | POA: Diagnosis not present

## 2021-12-10 DIAGNOSIS — R69 Illness, unspecified: Secondary | ICD-10-CM | POA: Diagnosis not present

## 2021-12-10 DIAGNOSIS — E1165 Type 2 diabetes mellitus with hyperglycemia: Secondary | ICD-10-CM | POA: Diagnosis not present

## 2021-12-10 DIAGNOSIS — N1832 Chronic kidney disease, stage 3b: Secondary | ICD-10-CM | POA: Diagnosis not present

## 2021-12-10 DIAGNOSIS — N250 Renal osteodystrophy: Secondary | ICD-10-CM | POA: Diagnosis not present

## 2021-12-10 DIAGNOSIS — G9349 Other encephalopathy: Secondary | ICD-10-CM | POA: Diagnosis not present

## 2021-12-11 DIAGNOSIS — E1165 Type 2 diabetes mellitus with hyperglycemia: Secondary | ICD-10-CM | POA: Diagnosis not present

## 2021-12-11 DIAGNOSIS — E876 Hypokalemia: Secondary | ICD-10-CM | POA: Diagnosis not present

## 2021-12-11 DIAGNOSIS — R69 Illness, unspecified: Secondary | ICD-10-CM | POA: Diagnosis not present

## 2021-12-11 DIAGNOSIS — N1832 Chronic kidney disease, stage 3b: Secondary | ICD-10-CM | POA: Diagnosis not present

## 2021-12-11 DIAGNOSIS — I639 Cerebral infarction, unspecified: Secondary | ICD-10-CM | POA: Diagnosis not present

## 2021-12-11 DIAGNOSIS — E119 Type 2 diabetes mellitus without complications: Secondary | ICD-10-CM | POA: Diagnosis not present

## 2021-12-11 DIAGNOSIS — D631 Anemia in chronic kidney disease: Secondary | ICD-10-CM | POA: Diagnosis not present

## 2021-12-11 DIAGNOSIS — G4733 Obstructive sleep apnea (adult) (pediatric): Secondary | ICD-10-CM | POA: Diagnosis not present

## 2021-12-11 DIAGNOSIS — G8191 Hemiplegia, unspecified affecting right dominant side: Secondary | ICD-10-CM | POA: Diagnosis not present

## 2021-12-11 DIAGNOSIS — D649 Anemia, unspecified: Secondary | ICD-10-CM | POA: Diagnosis not present

## 2021-12-11 DIAGNOSIS — N250 Renal osteodystrophy: Secondary | ICD-10-CM | POA: Diagnosis not present

## 2021-12-11 DIAGNOSIS — R9431 Abnormal electrocardiogram [ECG] [EKG]: Secondary | ICD-10-CM | POA: Diagnosis not present

## 2021-12-11 DIAGNOSIS — Z7409 Other reduced mobility: Secondary | ICD-10-CM | POA: Diagnosis not present

## 2021-12-11 DIAGNOSIS — G9349 Other encephalopathy: Secondary | ICD-10-CM | POA: Diagnosis not present

## 2021-12-11 DIAGNOSIS — N189 Chronic kidney disease, unspecified: Secondary | ICD-10-CM | POA: Diagnosis not present

## 2021-12-11 DIAGNOSIS — I10 Essential (primary) hypertension: Secondary | ICD-10-CM | POA: Diagnosis not present

## 2021-12-12 DIAGNOSIS — R69 Illness, unspecified: Secondary | ICD-10-CM | POA: Diagnosis not present

## 2021-12-12 DIAGNOSIS — G4733 Obstructive sleep apnea (adult) (pediatric): Secondary | ICD-10-CM | POA: Diagnosis not present

## 2021-12-12 DIAGNOSIS — I10 Essential (primary) hypertension: Secondary | ICD-10-CM | POA: Diagnosis not present

## 2021-12-12 DIAGNOSIS — G9349 Other encephalopathy: Secondary | ICD-10-CM | POA: Diagnosis not present

## 2021-12-12 DIAGNOSIS — G8191 Hemiplegia, unspecified affecting right dominant side: Secondary | ICD-10-CM | POA: Diagnosis not present

## 2021-12-12 DIAGNOSIS — Z7409 Other reduced mobility: Secondary | ICD-10-CM | POA: Diagnosis not present

## 2021-12-12 DIAGNOSIS — E1165 Type 2 diabetes mellitus with hyperglycemia: Secondary | ICD-10-CM | POA: Diagnosis not present

## 2021-12-12 DIAGNOSIS — N189 Chronic kidney disease, unspecified: Secondary | ICD-10-CM | POA: Diagnosis not present

## 2021-12-12 DIAGNOSIS — D649 Anemia, unspecified: Secondary | ICD-10-CM | POA: Diagnosis not present

## 2021-12-12 DIAGNOSIS — I639 Cerebral infarction, unspecified: Secondary | ICD-10-CM | POA: Diagnosis not present

## 2021-12-13 DIAGNOSIS — Z7409 Other reduced mobility: Secondary | ICD-10-CM | POA: Diagnosis not present

## 2021-12-13 DIAGNOSIS — G4733 Obstructive sleep apnea (adult) (pediatric): Secondary | ICD-10-CM | POA: Diagnosis not present

## 2021-12-13 DIAGNOSIS — N189 Chronic kidney disease, unspecified: Secondary | ICD-10-CM | POA: Diagnosis not present

## 2021-12-13 DIAGNOSIS — G8191 Hemiplegia, unspecified affecting right dominant side: Secondary | ICD-10-CM | POA: Diagnosis not present

## 2021-12-13 DIAGNOSIS — G9349 Other encephalopathy: Secondary | ICD-10-CM | POA: Diagnosis not present

## 2021-12-13 DIAGNOSIS — D649 Anemia, unspecified: Secondary | ICD-10-CM | POA: Diagnosis not present

## 2021-12-13 DIAGNOSIS — I10 Essential (primary) hypertension: Secondary | ICD-10-CM | POA: Diagnosis not present

## 2021-12-13 DIAGNOSIS — E1165 Type 2 diabetes mellitus with hyperglycemia: Secondary | ICD-10-CM | POA: Diagnosis not present

## 2021-12-13 DIAGNOSIS — I214 Non-ST elevation (NSTEMI) myocardial infarction: Secondary | ICD-10-CM | POA: Diagnosis not present

## 2021-12-13 DIAGNOSIS — R69 Illness, unspecified: Secondary | ICD-10-CM | POA: Diagnosis not present

## 2021-12-13 DIAGNOSIS — I639 Cerebral infarction, unspecified: Secondary | ICD-10-CM | POA: Diagnosis not present

## 2021-12-14 DIAGNOSIS — E1165 Type 2 diabetes mellitus with hyperglycemia: Secondary | ICD-10-CM | POA: Diagnosis not present

## 2021-12-14 DIAGNOSIS — E876 Hypokalemia: Secondary | ICD-10-CM | POA: Diagnosis not present

## 2021-12-14 DIAGNOSIS — N189 Chronic kidney disease, unspecified: Secondary | ICD-10-CM | POA: Diagnosis not present

## 2021-12-14 DIAGNOSIS — N250 Renal osteodystrophy: Secondary | ICD-10-CM | POA: Diagnosis not present

## 2021-12-14 DIAGNOSIS — D631 Anemia in chronic kidney disease: Secondary | ICD-10-CM | POA: Diagnosis not present

## 2021-12-14 DIAGNOSIS — E119 Type 2 diabetes mellitus without complications: Secondary | ICD-10-CM | POA: Diagnosis not present

## 2021-12-14 DIAGNOSIS — G4733 Obstructive sleep apnea (adult) (pediatric): Secondary | ICD-10-CM | POA: Diagnosis not present

## 2021-12-14 DIAGNOSIS — G8191 Hemiplegia, unspecified affecting right dominant side: Secondary | ICD-10-CM | POA: Diagnosis not present

## 2021-12-14 DIAGNOSIS — D649 Anemia, unspecified: Secondary | ICD-10-CM | POA: Diagnosis not present

## 2021-12-14 DIAGNOSIS — R69 Illness, unspecified: Secondary | ICD-10-CM | POA: Diagnosis not present

## 2021-12-14 DIAGNOSIS — R9431 Abnormal electrocardiogram [ECG] [EKG]: Secondary | ICD-10-CM | POA: Diagnosis not present

## 2021-12-14 DIAGNOSIS — N1832 Chronic kidney disease, stage 3b: Secondary | ICD-10-CM | POA: Diagnosis not present

## 2021-12-14 DIAGNOSIS — I639 Cerebral infarction, unspecified: Secondary | ICD-10-CM | POA: Diagnosis not present

## 2021-12-14 DIAGNOSIS — G9349 Other encephalopathy: Secondary | ICD-10-CM | POA: Diagnosis not present

## 2021-12-14 DIAGNOSIS — Z7409 Other reduced mobility: Secondary | ICD-10-CM | POA: Diagnosis not present

## 2021-12-14 DIAGNOSIS — I10 Essential (primary) hypertension: Secondary | ICD-10-CM | POA: Diagnosis not present

## 2021-12-15 DIAGNOSIS — N250 Renal osteodystrophy: Secondary | ICD-10-CM | POA: Diagnosis not present

## 2021-12-15 DIAGNOSIS — D649 Anemia, unspecified: Secondary | ICD-10-CM | POA: Diagnosis not present

## 2021-12-15 DIAGNOSIS — N1832 Chronic kidney disease, stage 3b: Secondary | ICD-10-CM | POA: Diagnosis not present

## 2021-12-15 DIAGNOSIS — D631 Anemia in chronic kidney disease: Secondary | ICD-10-CM | POA: Diagnosis not present

## 2021-12-15 DIAGNOSIS — R9431 Abnormal electrocardiogram [ECG] [EKG]: Secondary | ICD-10-CM | POA: Diagnosis not present

## 2021-12-15 DIAGNOSIS — R69 Illness, unspecified: Secondary | ICD-10-CM | POA: Diagnosis not present

## 2021-12-15 DIAGNOSIS — Z7409 Other reduced mobility: Secondary | ICD-10-CM | POA: Diagnosis not present

## 2021-12-15 DIAGNOSIS — E119 Type 2 diabetes mellitus without complications: Secondary | ICD-10-CM | POA: Diagnosis not present

## 2021-12-15 DIAGNOSIS — E1165 Type 2 diabetes mellitus with hyperglycemia: Secondary | ICD-10-CM | POA: Diagnosis not present

## 2021-12-15 DIAGNOSIS — G8191 Hemiplegia, unspecified affecting right dominant side: Secondary | ICD-10-CM | POA: Diagnosis not present

## 2021-12-15 DIAGNOSIS — I639 Cerebral infarction, unspecified: Secondary | ICD-10-CM | POA: Diagnosis not present

## 2021-12-15 DIAGNOSIS — G9349 Other encephalopathy: Secondary | ICD-10-CM | POA: Diagnosis not present

## 2021-12-15 DIAGNOSIS — N189 Chronic kidney disease, unspecified: Secondary | ICD-10-CM | POA: Diagnosis not present

## 2021-12-15 DIAGNOSIS — I10 Essential (primary) hypertension: Secondary | ICD-10-CM | POA: Diagnosis not present

## 2021-12-15 DIAGNOSIS — E876 Hypokalemia: Secondary | ICD-10-CM | POA: Diagnosis not present

## 2021-12-15 DIAGNOSIS — G4733 Obstructive sleep apnea (adult) (pediatric): Secondary | ICD-10-CM | POA: Diagnosis not present

## 2021-12-16 DIAGNOSIS — R9431 Abnormal electrocardiogram [ECG] [EKG]: Secondary | ICD-10-CM | POA: Diagnosis not present

## 2021-12-16 DIAGNOSIS — E1165 Type 2 diabetes mellitus with hyperglycemia: Secondary | ICD-10-CM | POA: Diagnosis not present

## 2021-12-16 DIAGNOSIS — N189 Chronic kidney disease, unspecified: Secondary | ICD-10-CM | POA: Diagnosis not present

## 2021-12-16 DIAGNOSIS — D649 Anemia, unspecified: Secondary | ICD-10-CM | POA: Diagnosis not present

## 2021-12-16 DIAGNOSIS — G4733 Obstructive sleep apnea (adult) (pediatric): Secondary | ICD-10-CM | POA: Diagnosis not present

## 2021-12-16 DIAGNOSIS — E876 Hypokalemia: Secondary | ICD-10-CM | POA: Diagnosis not present

## 2021-12-16 DIAGNOSIS — E119 Type 2 diabetes mellitus without complications: Secondary | ICD-10-CM | POA: Diagnosis not present

## 2021-12-16 DIAGNOSIS — G8191 Hemiplegia, unspecified affecting right dominant side: Secondary | ICD-10-CM | POA: Diagnosis not present

## 2021-12-16 DIAGNOSIS — I639 Cerebral infarction, unspecified: Secondary | ICD-10-CM | POA: Diagnosis not present

## 2021-12-16 DIAGNOSIS — R69 Illness, unspecified: Secondary | ICD-10-CM | POA: Diagnosis not present

## 2021-12-16 DIAGNOSIS — I10 Essential (primary) hypertension: Secondary | ICD-10-CM | POA: Diagnosis not present

## 2021-12-16 DIAGNOSIS — Z7409 Other reduced mobility: Secondary | ICD-10-CM | POA: Diagnosis not present

## 2021-12-16 DIAGNOSIS — N1832 Chronic kidney disease, stage 3b: Secondary | ICD-10-CM | POA: Diagnosis not present

## 2021-12-16 DIAGNOSIS — N250 Renal osteodystrophy: Secondary | ICD-10-CM | POA: Diagnosis not present

## 2021-12-16 DIAGNOSIS — G9349 Other encephalopathy: Secondary | ICD-10-CM | POA: Diagnosis not present

## 2021-12-16 DIAGNOSIS — D631 Anemia in chronic kidney disease: Secondary | ICD-10-CM | POA: Diagnosis not present

## 2021-12-17 DIAGNOSIS — R419 Unspecified symptoms and signs involving cognitive functions and awareness: Secondary | ICD-10-CM | POA: Diagnosis not present

## 2021-12-17 DIAGNOSIS — Z7982 Long term (current) use of aspirin: Secondary | ICD-10-CM | POA: Diagnosis not present

## 2021-12-17 DIAGNOSIS — Z7902 Long term (current) use of antithrombotics/antiplatelets: Secondary | ICD-10-CM | POA: Diagnosis not present

## 2021-12-17 DIAGNOSIS — D631 Anemia in chronic kidney disease: Secondary | ICD-10-CM | POA: Diagnosis not present

## 2021-12-17 DIAGNOSIS — G4733 Obstructive sleep apnea (adult) (pediatric): Secondary | ICD-10-CM | POA: Diagnosis not present

## 2021-12-17 DIAGNOSIS — N184 Chronic kidney disease, stage 4 (severe): Secondary | ICD-10-CM | POA: Diagnosis not present

## 2021-12-17 DIAGNOSIS — E785 Hyperlipidemia, unspecified: Secondary | ICD-10-CM | POA: Diagnosis not present

## 2021-12-17 DIAGNOSIS — Z9181 History of falling: Secondary | ICD-10-CM | POA: Diagnosis not present

## 2021-12-17 DIAGNOSIS — R69 Illness, unspecified: Secondary | ICD-10-CM | POA: Diagnosis not present

## 2021-12-17 DIAGNOSIS — I69351 Hemiplegia and hemiparesis following cerebral infarction affecting right dominant side: Secondary | ICD-10-CM | POA: Diagnosis not present

## 2021-12-17 DIAGNOSIS — E1165 Type 2 diabetes mellitus with hyperglycemia: Secondary | ICD-10-CM | POA: Diagnosis not present

## 2021-12-17 DIAGNOSIS — I129 Hypertensive chronic kidney disease with stage 1 through stage 4 chronic kidney disease, or unspecified chronic kidney disease: Secondary | ICD-10-CM | POA: Diagnosis not present

## 2021-12-17 DIAGNOSIS — E1122 Type 2 diabetes mellitus with diabetic chronic kidney disease: Secondary | ICD-10-CM | POA: Diagnosis not present

## 2021-12-22 DIAGNOSIS — D631 Anemia in chronic kidney disease: Secondary | ICD-10-CM | POA: Diagnosis not present

## 2021-12-22 DIAGNOSIS — Z7902 Long term (current) use of antithrombotics/antiplatelets: Secondary | ICD-10-CM | POA: Diagnosis not present

## 2021-12-22 DIAGNOSIS — E1122 Type 2 diabetes mellitus with diabetic chronic kidney disease: Secondary | ICD-10-CM | POA: Diagnosis not present

## 2021-12-22 DIAGNOSIS — I69351 Hemiplegia and hemiparesis following cerebral infarction affecting right dominant side: Secondary | ICD-10-CM | POA: Diagnosis not present

## 2021-12-22 DIAGNOSIS — Z7982 Long term (current) use of aspirin: Secondary | ICD-10-CM | POA: Diagnosis not present

## 2021-12-22 DIAGNOSIS — G4733 Obstructive sleep apnea (adult) (pediatric): Secondary | ICD-10-CM | POA: Diagnosis not present

## 2021-12-22 DIAGNOSIS — E1165 Type 2 diabetes mellitus with hyperglycemia: Secondary | ICD-10-CM | POA: Diagnosis not present

## 2021-12-22 DIAGNOSIS — N184 Chronic kidney disease, stage 4 (severe): Secondary | ICD-10-CM | POA: Diagnosis not present

## 2021-12-22 DIAGNOSIS — I129 Hypertensive chronic kidney disease with stage 1 through stage 4 chronic kidney disease, or unspecified chronic kidney disease: Secondary | ICD-10-CM | POA: Diagnosis not present

## 2021-12-22 DIAGNOSIS — R69 Illness, unspecified: Secondary | ICD-10-CM | POA: Diagnosis not present

## 2021-12-22 DIAGNOSIS — Z9181 History of falling: Secondary | ICD-10-CM | POA: Diagnosis not present

## 2021-12-22 DIAGNOSIS — R419 Unspecified symptoms and signs involving cognitive functions and awareness: Secondary | ICD-10-CM | POA: Diagnosis not present

## 2021-12-22 DIAGNOSIS — E785 Hyperlipidemia, unspecified: Secondary | ICD-10-CM | POA: Diagnosis not present

## 2021-12-24 DIAGNOSIS — Z7982 Long term (current) use of aspirin: Secondary | ICD-10-CM | POA: Diagnosis not present

## 2021-12-24 DIAGNOSIS — N184 Chronic kidney disease, stage 4 (severe): Secondary | ICD-10-CM | POA: Diagnosis not present

## 2021-12-24 DIAGNOSIS — G4733 Obstructive sleep apnea (adult) (pediatric): Secondary | ICD-10-CM | POA: Diagnosis not present

## 2021-12-24 DIAGNOSIS — R69 Illness, unspecified: Secondary | ICD-10-CM | POA: Diagnosis not present

## 2021-12-24 DIAGNOSIS — Z9181 History of falling: Secondary | ICD-10-CM | POA: Diagnosis not present

## 2021-12-24 DIAGNOSIS — E1122 Type 2 diabetes mellitus with diabetic chronic kidney disease: Secondary | ICD-10-CM | POA: Diagnosis not present

## 2021-12-24 DIAGNOSIS — I69351 Hemiplegia and hemiparesis following cerebral infarction affecting right dominant side: Secondary | ICD-10-CM | POA: Diagnosis not present

## 2021-12-24 DIAGNOSIS — Z7902 Long term (current) use of antithrombotics/antiplatelets: Secondary | ICD-10-CM | POA: Diagnosis not present

## 2021-12-24 DIAGNOSIS — E1165 Type 2 diabetes mellitus with hyperglycemia: Secondary | ICD-10-CM | POA: Diagnosis not present

## 2021-12-24 DIAGNOSIS — I129 Hypertensive chronic kidney disease with stage 1 through stage 4 chronic kidney disease, or unspecified chronic kidney disease: Secondary | ICD-10-CM | POA: Diagnosis not present

## 2021-12-24 DIAGNOSIS — R419 Unspecified symptoms and signs involving cognitive functions and awareness: Secondary | ICD-10-CM | POA: Diagnosis not present

## 2021-12-24 DIAGNOSIS — D631 Anemia in chronic kidney disease: Secondary | ICD-10-CM | POA: Diagnosis not present

## 2021-12-24 DIAGNOSIS — E785 Hyperlipidemia, unspecified: Secondary | ICD-10-CM | POA: Diagnosis not present

## 2021-12-28 DIAGNOSIS — G40909 Epilepsy, unspecified, not intractable, without status epilepticus: Secondary | ICD-10-CM | POA: Diagnosis not present

## 2021-12-28 DIAGNOSIS — I639 Cerebral infarction, unspecified: Secondary | ICD-10-CM | POA: Diagnosis not present

## 2021-12-28 DIAGNOSIS — R4189 Other symptoms and signs involving cognitive functions and awareness: Secondary | ICD-10-CM | POA: Diagnosis not present

## 2021-12-28 DIAGNOSIS — I10 Essential (primary) hypertension: Secondary | ICD-10-CM | POA: Diagnosis not present

## 2021-12-29 DIAGNOSIS — R69 Illness, unspecified: Secondary | ICD-10-CM | POA: Diagnosis not present

## 2021-12-29 DIAGNOSIS — I69351 Hemiplegia and hemiparesis following cerebral infarction affecting right dominant side: Secondary | ICD-10-CM | POA: Diagnosis not present

## 2021-12-29 DIAGNOSIS — Z7982 Long term (current) use of aspirin: Secondary | ICD-10-CM | POA: Diagnosis not present

## 2021-12-29 DIAGNOSIS — I129 Hypertensive chronic kidney disease with stage 1 through stage 4 chronic kidney disease, or unspecified chronic kidney disease: Secondary | ICD-10-CM | POA: Diagnosis not present

## 2021-12-29 DIAGNOSIS — D631 Anemia in chronic kidney disease: Secondary | ICD-10-CM | POA: Diagnosis not present

## 2021-12-29 DIAGNOSIS — Z9181 History of falling: Secondary | ICD-10-CM | POA: Diagnosis not present

## 2021-12-29 DIAGNOSIS — N184 Chronic kidney disease, stage 4 (severe): Secondary | ICD-10-CM | POA: Diagnosis not present

## 2021-12-29 DIAGNOSIS — Z7902 Long term (current) use of antithrombotics/antiplatelets: Secondary | ICD-10-CM | POA: Diagnosis not present

## 2021-12-29 DIAGNOSIS — G4733 Obstructive sleep apnea (adult) (pediatric): Secondary | ICD-10-CM | POA: Diagnosis not present

## 2021-12-29 DIAGNOSIS — R419 Unspecified symptoms and signs involving cognitive functions and awareness: Secondary | ICD-10-CM | POA: Diagnosis not present

## 2021-12-29 DIAGNOSIS — E1122 Type 2 diabetes mellitus with diabetic chronic kidney disease: Secondary | ICD-10-CM | POA: Diagnosis not present

## 2021-12-29 DIAGNOSIS — E785 Hyperlipidemia, unspecified: Secondary | ICD-10-CM | POA: Diagnosis not present

## 2021-12-29 DIAGNOSIS — E1165 Type 2 diabetes mellitus with hyperglycemia: Secondary | ICD-10-CM | POA: Diagnosis not present

## 2021-12-30 DIAGNOSIS — N184 Chronic kidney disease, stage 4 (severe): Secondary | ICD-10-CM | POA: Diagnosis not present

## 2021-12-30 DIAGNOSIS — Z7902 Long term (current) use of antithrombotics/antiplatelets: Secondary | ICD-10-CM | POA: Diagnosis not present

## 2021-12-30 DIAGNOSIS — I69351 Hemiplegia and hemiparesis following cerebral infarction affecting right dominant side: Secondary | ICD-10-CM | POA: Diagnosis not present

## 2021-12-30 DIAGNOSIS — E1165 Type 2 diabetes mellitus with hyperglycemia: Secondary | ICD-10-CM | POA: Diagnosis not present

## 2021-12-30 DIAGNOSIS — Z9181 History of falling: Secondary | ICD-10-CM | POA: Diagnosis not present

## 2021-12-30 DIAGNOSIS — R69 Illness, unspecified: Secondary | ICD-10-CM | POA: Diagnosis not present

## 2021-12-30 DIAGNOSIS — I129 Hypertensive chronic kidney disease with stage 1 through stage 4 chronic kidney disease, or unspecified chronic kidney disease: Secondary | ICD-10-CM | POA: Diagnosis not present

## 2021-12-30 DIAGNOSIS — Z7982 Long term (current) use of aspirin: Secondary | ICD-10-CM | POA: Diagnosis not present

## 2021-12-30 DIAGNOSIS — R419 Unspecified symptoms and signs involving cognitive functions and awareness: Secondary | ICD-10-CM | POA: Diagnosis not present

## 2021-12-30 DIAGNOSIS — E1122 Type 2 diabetes mellitus with diabetic chronic kidney disease: Secondary | ICD-10-CM | POA: Diagnosis not present

## 2021-12-30 DIAGNOSIS — E785 Hyperlipidemia, unspecified: Secondary | ICD-10-CM | POA: Diagnosis not present

## 2021-12-30 DIAGNOSIS — G4733 Obstructive sleep apnea (adult) (pediatric): Secondary | ICD-10-CM | POA: Diagnosis not present

## 2021-12-30 DIAGNOSIS — D631 Anemia in chronic kidney disease: Secondary | ICD-10-CM | POA: Diagnosis not present

## 2022-01-01 DIAGNOSIS — R69 Illness, unspecified: Secondary | ICD-10-CM | POA: Diagnosis not present

## 2022-01-01 DIAGNOSIS — I639 Cerebral infarction, unspecified: Secondary | ICD-10-CM | POA: Diagnosis not present

## 2022-01-01 DIAGNOSIS — I69351 Hemiplegia and hemiparesis following cerebral infarction affecting right dominant side: Secondary | ICD-10-CM | POA: Diagnosis not present

## 2022-01-01 DIAGNOSIS — G4733 Obstructive sleep apnea (adult) (pediatric): Secondary | ICD-10-CM | POA: Diagnosis not present

## 2022-01-01 DIAGNOSIS — Z7902 Long term (current) use of antithrombotics/antiplatelets: Secondary | ICD-10-CM | POA: Diagnosis not present

## 2022-01-01 DIAGNOSIS — D631 Anemia in chronic kidney disease: Secondary | ICD-10-CM | POA: Diagnosis not present

## 2022-01-01 DIAGNOSIS — E1165 Type 2 diabetes mellitus with hyperglycemia: Secondary | ICD-10-CM | POA: Diagnosis not present

## 2022-01-01 DIAGNOSIS — E1122 Type 2 diabetes mellitus with diabetic chronic kidney disease: Secondary | ICD-10-CM | POA: Diagnosis not present

## 2022-01-01 DIAGNOSIS — R41841 Cognitive communication deficit: Secondary | ICD-10-CM | POA: Diagnosis not present

## 2022-01-01 DIAGNOSIS — N184 Chronic kidney disease, stage 4 (severe): Secondary | ICD-10-CM | POA: Diagnosis not present

## 2022-01-01 DIAGNOSIS — Z9181 History of falling: Secondary | ICD-10-CM | POA: Diagnosis not present

## 2022-01-01 DIAGNOSIS — I129 Hypertensive chronic kidney disease with stage 1 through stage 4 chronic kidney disease, or unspecified chronic kidney disease: Secondary | ICD-10-CM | POA: Diagnosis not present

## 2022-01-01 DIAGNOSIS — E785 Hyperlipidemia, unspecified: Secondary | ICD-10-CM | POA: Diagnosis not present

## 2022-01-01 DIAGNOSIS — R419 Unspecified symptoms and signs involving cognitive functions and awareness: Secondary | ICD-10-CM | POA: Diagnosis not present

## 2022-01-01 DIAGNOSIS — Z7982 Long term (current) use of aspirin: Secondary | ICD-10-CM | POA: Diagnosis not present

## 2022-01-04 DIAGNOSIS — Z0001 Encounter for general adult medical examination with abnormal findings: Secondary | ICD-10-CM | POA: Diagnosis not present

## 2022-01-05 DIAGNOSIS — I129 Hypertensive chronic kidney disease with stage 1 through stage 4 chronic kidney disease, or unspecified chronic kidney disease: Secondary | ICD-10-CM | POA: Diagnosis not present

## 2022-01-05 DIAGNOSIS — R419 Unspecified symptoms and signs involving cognitive functions and awareness: Secondary | ICD-10-CM | POA: Diagnosis not present

## 2022-01-05 DIAGNOSIS — E1122 Type 2 diabetes mellitus with diabetic chronic kidney disease: Secondary | ICD-10-CM | POA: Diagnosis not present

## 2022-01-05 DIAGNOSIS — I69351 Hemiplegia and hemiparesis following cerebral infarction affecting right dominant side: Secondary | ICD-10-CM | POA: Diagnosis not present

## 2022-01-05 DIAGNOSIS — R69 Illness, unspecified: Secondary | ICD-10-CM | POA: Diagnosis not present

## 2022-01-05 DIAGNOSIS — Z7982 Long term (current) use of aspirin: Secondary | ICD-10-CM | POA: Diagnosis not present

## 2022-01-05 DIAGNOSIS — N184 Chronic kidney disease, stage 4 (severe): Secondary | ICD-10-CM | POA: Diagnosis not present

## 2022-01-05 DIAGNOSIS — E785 Hyperlipidemia, unspecified: Secondary | ICD-10-CM | POA: Diagnosis not present

## 2022-01-05 DIAGNOSIS — Z9181 History of falling: Secondary | ICD-10-CM | POA: Diagnosis not present

## 2022-01-05 DIAGNOSIS — D631 Anemia in chronic kidney disease: Secondary | ICD-10-CM | POA: Diagnosis not present

## 2022-01-05 DIAGNOSIS — G4733 Obstructive sleep apnea (adult) (pediatric): Secondary | ICD-10-CM | POA: Diagnosis not present

## 2022-01-05 DIAGNOSIS — Z7902 Long term (current) use of antithrombotics/antiplatelets: Secondary | ICD-10-CM | POA: Diagnosis not present

## 2022-01-05 DIAGNOSIS — E1165 Type 2 diabetes mellitus with hyperglycemia: Secondary | ICD-10-CM | POA: Diagnosis not present

## 2022-01-06 DIAGNOSIS — E1165 Type 2 diabetes mellitus with hyperglycemia: Secondary | ICD-10-CM | POA: Diagnosis not present

## 2022-01-06 DIAGNOSIS — I69351 Hemiplegia and hemiparesis following cerebral infarction affecting right dominant side: Secondary | ICD-10-CM | POA: Diagnosis not present

## 2022-01-06 DIAGNOSIS — Z7982 Long term (current) use of aspirin: Secondary | ICD-10-CM | POA: Diagnosis not present

## 2022-01-06 DIAGNOSIS — E785 Hyperlipidemia, unspecified: Secondary | ICD-10-CM | POA: Diagnosis not present

## 2022-01-06 DIAGNOSIS — R69 Illness, unspecified: Secondary | ICD-10-CM | POA: Diagnosis not present

## 2022-01-06 DIAGNOSIS — Z9181 History of falling: Secondary | ICD-10-CM | POA: Diagnosis not present

## 2022-01-06 DIAGNOSIS — I129 Hypertensive chronic kidney disease with stage 1 through stage 4 chronic kidney disease, or unspecified chronic kidney disease: Secondary | ICD-10-CM | POA: Diagnosis not present

## 2022-01-06 DIAGNOSIS — D631 Anemia in chronic kidney disease: Secondary | ICD-10-CM | POA: Diagnosis not present

## 2022-01-06 DIAGNOSIS — R419 Unspecified symptoms and signs involving cognitive functions and awareness: Secondary | ICD-10-CM | POA: Diagnosis not present

## 2022-01-06 DIAGNOSIS — G4733 Obstructive sleep apnea (adult) (pediatric): Secondary | ICD-10-CM | POA: Diagnosis not present

## 2022-01-06 DIAGNOSIS — N184 Chronic kidney disease, stage 4 (severe): Secondary | ICD-10-CM | POA: Diagnosis not present

## 2022-01-06 DIAGNOSIS — Z7902 Long term (current) use of antithrombotics/antiplatelets: Secondary | ICD-10-CM | POA: Diagnosis not present

## 2022-01-06 DIAGNOSIS — E1122 Type 2 diabetes mellitus with diabetic chronic kidney disease: Secondary | ICD-10-CM | POA: Diagnosis not present

## 2022-01-08 DIAGNOSIS — Z9181 History of falling: Secondary | ICD-10-CM | POA: Diagnosis not present

## 2022-01-08 DIAGNOSIS — G4733 Obstructive sleep apnea (adult) (pediatric): Secondary | ICD-10-CM | POA: Diagnosis not present

## 2022-01-08 DIAGNOSIS — I129 Hypertensive chronic kidney disease with stage 1 through stage 4 chronic kidney disease, or unspecified chronic kidney disease: Secondary | ICD-10-CM | POA: Diagnosis not present

## 2022-01-08 DIAGNOSIS — R419 Unspecified symptoms and signs involving cognitive functions and awareness: Secondary | ICD-10-CM | POA: Diagnosis not present

## 2022-01-08 DIAGNOSIS — Z7902 Long term (current) use of antithrombotics/antiplatelets: Secondary | ICD-10-CM | POA: Diagnosis not present

## 2022-01-08 DIAGNOSIS — D631 Anemia in chronic kidney disease: Secondary | ICD-10-CM | POA: Diagnosis not present

## 2022-01-08 DIAGNOSIS — Z7982 Long term (current) use of aspirin: Secondary | ICD-10-CM | POA: Diagnosis not present

## 2022-01-08 DIAGNOSIS — E1165 Type 2 diabetes mellitus with hyperglycemia: Secondary | ICD-10-CM | POA: Diagnosis not present

## 2022-01-08 DIAGNOSIS — E785 Hyperlipidemia, unspecified: Secondary | ICD-10-CM | POA: Diagnosis not present

## 2022-01-08 DIAGNOSIS — E1122 Type 2 diabetes mellitus with diabetic chronic kidney disease: Secondary | ICD-10-CM | POA: Diagnosis not present

## 2022-01-08 DIAGNOSIS — I69351 Hemiplegia and hemiparesis following cerebral infarction affecting right dominant side: Secondary | ICD-10-CM | POA: Diagnosis not present

## 2022-01-08 DIAGNOSIS — N184 Chronic kidney disease, stage 4 (severe): Secondary | ICD-10-CM | POA: Diagnosis not present

## 2022-01-08 DIAGNOSIS — R69 Illness, unspecified: Secondary | ICD-10-CM | POA: Diagnosis not present

## 2022-01-13 DIAGNOSIS — G4733 Obstructive sleep apnea (adult) (pediatric): Secondary | ICD-10-CM | POA: Diagnosis not present

## 2022-01-13 DIAGNOSIS — R419 Unspecified symptoms and signs involving cognitive functions and awareness: Secondary | ICD-10-CM | POA: Diagnosis not present

## 2022-01-13 DIAGNOSIS — Z7902 Long term (current) use of antithrombotics/antiplatelets: Secondary | ICD-10-CM | POA: Diagnosis not present

## 2022-01-13 DIAGNOSIS — R69 Illness, unspecified: Secondary | ICD-10-CM | POA: Diagnosis not present

## 2022-01-13 DIAGNOSIS — E1122 Type 2 diabetes mellitus with diabetic chronic kidney disease: Secondary | ICD-10-CM | POA: Diagnosis not present

## 2022-01-13 DIAGNOSIS — E1165 Type 2 diabetes mellitus with hyperglycemia: Secondary | ICD-10-CM | POA: Diagnosis not present

## 2022-01-13 DIAGNOSIS — I129 Hypertensive chronic kidney disease with stage 1 through stage 4 chronic kidney disease, or unspecified chronic kidney disease: Secondary | ICD-10-CM | POA: Diagnosis not present

## 2022-01-13 DIAGNOSIS — Z7982 Long term (current) use of aspirin: Secondary | ICD-10-CM | POA: Diagnosis not present

## 2022-01-13 DIAGNOSIS — D631 Anemia in chronic kidney disease: Secondary | ICD-10-CM | POA: Diagnosis not present

## 2022-01-13 DIAGNOSIS — Z9181 History of falling: Secondary | ICD-10-CM | POA: Diagnosis not present

## 2022-01-13 DIAGNOSIS — I69351 Hemiplegia and hemiparesis following cerebral infarction affecting right dominant side: Secondary | ICD-10-CM | POA: Diagnosis not present

## 2022-01-13 DIAGNOSIS — E785 Hyperlipidemia, unspecified: Secondary | ICD-10-CM | POA: Diagnosis not present

## 2022-01-13 DIAGNOSIS — N184 Chronic kidney disease, stage 4 (severe): Secondary | ICD-10-CM | POA: Diagnosis not present

## 2022-01-14 DIAGNOSIS — G4733 Obstructive sleep apnea (adult) (pediatric): Secondary | ICD-10-CM | POA: Diagnosis not present

## 2022-01-14 DIAGNOSIS — E1165 Type 2 diabetes mellitus with hyperglycemia: Secondary | ICD-10-CM | POA: Diagnosis not present

## 2022-01-14 DIAGNOSIS — R419 Unspecified symptoms and signs involving cognitive functions and awareness: Secondary | ICD-10-CM | POA: Diagnosis not present

## 2022-01-14 DIAGNOSIS — N184 Chronic kidney disease, stage 4 (severe): Secondary | ICD-10-CM | POA: Diagnosis not present

## 2022-01-14 DIAGNOSIS — Z7902 Long term (current) use of antithrombotics/antiplatelets: Secondary | ICD-10-CM | POA: Diagnosis not present

## 2022-01-14 DIAGNOSIS — I129 Hypertensive chronic kidney disease with stage 1 through stage 4 chronic kidney disease, or unspecified chronic kidney disease: Secondary | ICD-10-CM | POA: Diagnosis not present

## 2022-01-14 DIAGNOSIS — E785 Hyperlipidemia, unspecified: Secondary | ICD-10-CM | POA: Diagnosis not present

## 2022-01-14 DIAGNOSIS — I69351 Hemiplegia and hemiparesis following cerebral infarction affecting right dominant side: Secondary | ICD-10-CM | POA: Diagnosis not present

## 2022-01-14 DIAGNOSIS — Z7982 Long term (current) use of aspirin: Secondary | ICD-10-CM | POA: Diagnosis not present

## 2022-01-14 DIAGNOSIS — E1122 Type 2 diabetes mellitus with diabetic chronic kidney disease: Secondary | ICD-10-CM | POA: Diagnosis not present

## 2022-01-14 DIAGNOSIS — D631 Anemia in chronic kidney disease: Secondary | ICD-10-CM | POA: Diagnosis not present

## 2022-01-14 DIAGNOSIS — R69 Illness, unspecified: Secondary | ICD-10-CM | POA: Diagnosis not present

## 2022-01-14 DIAGNOSIS — Z9181 History of falling: Secondary | ICD-10-CM | POA: Diagnosis not present

## 2022-01-19 DIAGNOSIS — N184 Chronic kidney disease, stage 4 (severe): Secondary | ICD-10-CM | POA: Diagnosis not present

## 2022-01-19 DIAGNOSIS — E1165 Type 2 diabetes mellitus with hyperglycemia: Secondary | ICD-10-CM | POA: Diagnosis not present

## 2022-01-19 DIAGNOSIS — Z7982 Long term (current) use of aspirin: Secondary | ICD-10-CM | POA: Diagnosis not present

## 2022-01-19 DIAGNOSIS — I129 Hypertensive chronic kidney disease with stage 1 through stage 4 chronic kidney disease, or unspecified chronic kidney disease: Secondary | ICD-10-CM | POA: Diagnosis not present

## 2022-01-19 DIAGNOSIS — G4733 Obstructive sleep apnea (adult) (pediatric): Secondary | ICD-10-CM | POA: Diagnosis not present

## 2022-01-19 DIAGNOSIS — R69 Illness, unspecified: Secondary | ICD-10-CM | POA: Diagnosis not present

## 2022-01-19 DIAGNOSIS — Z7902 Long term (current) use of antithrombotics/antiplatelets: Secondary | ICD-10-CM | POA: Diagnosis not present

## 2022-01-19 DIAGNOSIS — I69351 Hemiplegia and hemiparesis following cerebral infarction affecting right dominant side: Secondary | ICD-10-CM | POA: Diagnosis not present

## 2022-01-19 DIAGNOSIS — E1122 Type 2 diabetes mellitus with diabetic chronic kidney disease: Secondary | ICD-10-CM | POA: Diagnosis not present

## 2022-01-19 DIAGNOSIS — R419 Unspecified symptoms and signs involving cognitive functions and awareness: Secondary | ICD-10-CM | POA: Diagnosis not present

## 2022-01-19 DIAGNOSIS — E785 Hyperlipidemia, unspecified: Secondary | ICD-10-CM | POA: Diagnosis not present

## 2022-01-19 DIAGNOSIS — Z9181 History of falling: Secondary | ICD-10-CM | POA: Diagnosis not present

## 2022-01-19 DIAGNOSIS — D631 Anemia in chronic kidney disease: Secondary | ICD-10-CM | POA: Diagnosis not present

## 2022-01-20 DIAGNOSIS — D631 Anemia in chronic kidney disease: Secondary | ICD-10-CM | POA: Diagnosis not present

## 2022-01-20 DIAGNOSIS — E1122 Type 2 diabetes mellitus with diabetic chronic kidney disease: Secondary | ICD-10-CM | POA: Diagnosis not present

## 2022-01-20 DIAGNOSIS — G4733 Obstructive sleep apnea (adult) (pediatric): Secondary | ICD-10-CM | POA: Diagnosis not present

## 2022-01-20 DIAGNOSIS — R419 Unspecified symptoms and signs involving cognitive functions and awareness: Secondary | ICD-10-CM | POA: Diagnosis not present

## 2022-01-20 DIAGNOSIS — E1165 Type 2 diabetes mellitus with hyperglycemia: Secondary | ICD-10-CM | POA: Diagnosis not present

## 2022-01-20 DIAGNOSIS — N184 Chronic kidney disease, stage 4 (severe): Secondary | ICD-10-CM | POA: Diagnosis not present

## 2022-01-20 DIAGNOSIS — Z7982 Long term (current) use of aspirin: Secondary | ICD-10-CM | POA: Diagnosis not present

## 2022-01-20 DIAGNOSIS — R69 Illness, unspecified: Secondary | ICD-10-CM | POA: Diagnosis not present

## 2022-01-20 DIAGNOSIS — E785 Hyperlipidemia, unspecified: Secondary | ICD-10-CM | POA: Diagnosis not present

## 2022-01-20 DIAGNOSIS — I129 Hypertensive chronic kidney disease with stage 1 through stage 4 chronic kidney disease, or unspecified chronic kidney disease: Secondary | ICD-10-CM | POA: Diagnosis not present

## 2022-01-20 DIAGNOSIS — I69351 Hemiplegia and hemiparesis following cerebral infarction affecting right dominant side: Secondary | ICD-10-CM | POA: Diagnosis not present

## 2022-01-20 DIAGNOSIS — Z7902 Long term (current) use of antithrombotics/antiplatelets: Secondary | ICD-10-CM | POA: Diagnosis not present

## 2022-01-20 DIAGNOSIS — Z9181 History of falling: Secondary | ICD-10-CM | POA: Diagnosis not present

## 2022-01-21 DIAGNOSIS — Z7982 Long term (current) use of aspirin: Secondary | ICD-10-CM | POA: Diagnosis not present

## 2022-01-21 DIAGNOSIS — R419 Unspecified symptoms and signs involving cognitive functions and awareness: Secondary | ICD-10-CM | POA: Diagnosis not present

## 2022-01-21 DIAGNOSIS — R69 Illness, unspecified: Secondary | ICD-10-CM | POA: Diagnosis not present

## 2022-01-21 DIAGNOSIS — E785 Hyperlipidemia, unspecified: Secondary | ICD-10-CM | POA: Diagnosis not present

## 2022-01-21 DIAGNOSIS — N184 Chronic kidney disease, stage 4 (severe): Secondary | ICD-10-CM | POA: Diagnosis not present

## 2022-01-21 DIAGNOSIS — Z9181 History of falling: Secondary | ICD-10-CM | POA: Diagnosis not present

## 2022-01-21 DIAGNOSIS — I69351 Hemiplegia and hemiparesis following cerebral infarction affecting right dominant side: Secondary | ICD-10-CM | POA: Diagnosis not present

## 2022-01-21 DIAGNOSIS — E1165 Type 2 diabetes mellitus with hyperglycemia: Secondary | ICD-10-CM | POA: Diagnosis not present

## 2022-01-21 DIAGNOSIS — I129 Hypertensive chronic kidney disease with stage 1 through stage 4 chronic kidney disease, or unspecified chronic kidney disease: Secondary | ICD-10-CM | POA: Diagnosis not present

## 2022-01-21 DIAGNOSIS — E1122 Type 2 diabetes mellitus with diabetic chronic kidney disease: Secondary | ICD-10-CM | POA: Diagnosis not present

## 2022-01-21 DIAGNOSIS — Z7902 Long term (current) use of antithrombotics/antiplatelets: Secondary | ICD-10-CM | POA: Diagnosis not present

## 2022-01-21 DIAGNOSIS — G4733 Obstructive sleep apnea (adult) (pediatric): Secondary | ICD-10-CM | POA: Diagnosis not present

## 2022-01-21 DIAGNOSIS — D631 Anemia in chronic kidney disease: Secondary | ICD-10-CM | POA: Diagnosis not present

## 2022-01-22 DIAGNOSIS — R419 Unspecified symptoms and signs involving cognitive functions and awareness: Secondary | ICD-10-CM | POA: Diagnosis not present

## 2022-01-22 DIAGNOSIS — R69 Illness, unspecified: Secondary | ICD-10-CM | POA: Diagnosis not present

## 2022-01-22 DIAGNOSIS — Z7982 Long term (current) use of aspirin: Secondary | ICD-10-CM | POA: Diagnosis not present

## 2022-01-22 DIAGNOSIS — I129 Hypertensive chronic kidney disease with stage 1 through stage 4 chronic kidney disease, or unspecified chronic kidney disease: Secondary | ICD-10-CM | POA: Diagnosis not present

## 2022-01-22 DIAGNOSIS — E1165 Type 2 diabetes mellitus with hyperglycemia: Secondary | ICD-10-CM | POA: Diagnosis not present

## 2022-01-22 DIAGNOSIS — I69351 Hemiplegia and hemiparesis following cerebral infarction affecting right dominant side: Secondary | ICD-10-CM | POA: Diagnosis not present

## 2022-01-22 DIAGNOSIS — E1122 Type 2 diabetes mellitus with diabetic chronic kidney disease: Secondary | ICD-10-CM | POA: Diagnosis not present

## 2022-01-22 DIAGNOSIS — N184 Chronic kidney disease, stage 4 (severe): Secondary | ICD-10-CM | POA: Diagnosis not present

## 2022-01-22 DIAGNOSIS — Z7902 Long term (current) use of antithrombotics/antiplatelets: Secondary | ICD-10-CM | POA: Diagnosis not present

## 2022-01-22 DIAGNOSIS — D631 Anemia in chronic kidney disease: Secondary | ICD-10-CM | POA: Diagnosis not present

## 2022-01-22 DIAGNOSIS — E785 Hyperlipidemia, unspecified: Secondary | ICD-10-CM | POA: Diagnosis not present

## 2022-01-22 DIAGNOSIS — Z9181 History of falling: Secondary | ICD-10-CM | POA: Diagnosis not present

## 2022-01-22 DIAGNOSIS — G4733 Obstructive sleep apnea (adult) (pediatric): Secondary | ICD-10-CM | POA: Diagnosis not present

## 2022-01-27 DIAGNOSIS — G4733 Obstructive sleep apnea (adult) (pediatric): Secondary | ICD-10-CM | POA: Diagnosis not present

## 2022-01-27 DIAGNOSIS — R69 Illness, unspecified: Secondary | ICD-10-CM | POA: Diagnosis not present

## 2022-01-27 DIAGNOSIS — E785 Hyperlipidemia, unspecified: Secondary | ICD-10-CM | POA: Diagnosis not present

## 2022-01-27 DIAGNOSIS — R419 Unspecified symptoms and signs involving cognitive functions and awareness: Secondary | ICD-10-CM | POA: Diagnosis not present

## 2022-01-27 DIAGNOSIS — Z7982 Long term (current) use of aspirin: Secondary | ICD-10-CM | POA: Diagnosis not present

## 2022-01-27 DIAGNOSIS — E1165 Type 2 diabetes mellitus with hyperglycemia: Secondary | ICD-10-CM | POA: Diagnosis not present

## 2022-01-27 DIAGNOSIS — N184 Chronic kidney disease, stage 4 (severe): Secondary | ICD-10-CM | POA: Diagnosis not present

## 2022-01-27 DIAGNOSIS — E1122 Type 2 diabetes mellitus with diabetic chronic kidney disease: Secondary | ICD-10-CM | POA: Diagnosis not present

## 2022-01-27 DIAGNOSIS — I69351 Hemiplegia and hemiparesis following cerebral infarction affecting right dominant side: Secondary | ICD-10-CM | POA: Diagnosis not present

## 2022-01-27 DIAGNOSIS — Z9181 History of falling: Secondary | ICD-10-CM | POA: Diagnosis not present

## 2022-01-27 DIAGNOSIS — I129 Hypertensive chronic kidney disease with stage 1 through stage 4 chronic kidney disease, or unspecified chronic kidney disease: Secondary | ICD-10-CM | POA: Diagnosis not present

## 2022-01-27 DIAGNOSIS — D631 Anemia in chronic kidney disease: Secondary | ICD-10-CM | POA: Diagnosis not present

## 2022-01-27 DIAGNOSIS — Z7902 Long term (current) use of antithrombotics/antiplatelets: Secondary | ICD-10-CM | POA: Diagnosis not present

## 2022-01-29 DIAGNOSIS — E1165 Type 2 diabetes mellitus with hyperglycemia: Secondary | ICD-10-CM | POA: Diagnosis not present

## 2022-01-29 DIAGNOSIS — R69 Illness, unspecified: Secondary | ICD-10-CM | POA: Diagnosis not present

## 2022-01-29 DIAGNOSIS — D631 Anemia in chronic kidney disease: Secondary | ICD-10-CM | POA: Diagnosis not present

## 2022-01-29 DIAGNOSIS — Z7902 Long term (current) use of antithrombotics/antiplatelets: Secondary | ICD-10-CM | POA: Diagnosis not present

## 2022-01-29 DIAGNOSIS — E1122 Type 2 diabetes mellitus with diabetic chronic kidney disease: Secondary | ICD-10-CM | POA: Diagnosis not present

## 2022-01-29 DIAGNOSIS — I129 Hypertensive chronic kidney disease with stage 1 through stage 4 chronic kidney disease, or unspecified chronic kidney disease: Secondary | ICD-10-CM | POA: Diagnosis not present

## 2022-01-29 DIAGNOSIS — Z7982 Long term (current) use of aspirin: Secondary | ICD-10-CM | POA: Diagnosis not present

## 2022-01-29 DIAGNOSIS — R419 Unspecified symptoms and signs involving cognitive functions and awareness: Secondary | ICD-10-CM | POA: Diagnosis not present

## 2022-01-29 DIAGNOSIS — N184 Chronic kidney disease, stage 4 (severe): Secondary | ICD-10-CM | POA: Diagnosis not present

## 2022-01-29 DIAGNOSIS — I69351 Hemiplegia and hemiparesis following cerebral infarction affecting right dominant side: Secondary | ICD-10-CM | POA: Diagnosis not present

## 2022-01-29 DIAGNOSIS — Z9181 History of falling: Secondary | ICD-10-CM | POA: Diagnosis not present

## 2022-01-29 DIAGNOSIS — E785 Hyperlipidemia, unspecified: Secondary | ICD-10-CM | POA: Diagnosis not present

## 2022-01-29 DIAGNOSIS — G4733 Obstructive sleep apnea (adult) (pediatric): Secondary | ICD-10-CM | POA: Diagnosis not present

## 2022-02-03 DIAGNOSIS — E1122 Type 2 diabetes mellitus with diabetic chronic kidney disease: Secondary | ICD-10-CM | POA: Diagnosis not present

## 2022-02-03 DIAGNOSIS — R419 Unspecified symptoms and signs involving cognitive functions and awareness: Secondary | ICD-10-CM | POA: Diagnosis not present

## 2022-02-03 DIAGNOSIS — N184 Chronic kidney disease, stage 4 (severe): Secondary | ICD-10-CM | POA: Diagnosis not present

## 2022-02-03 DIAGNOSIS — E785 Hyperlipidemia, unspecified: Secondary | ICD-10-CM | POA: Diagnosis not present

## 2022-02-03 DIAGNOSIS — Z7982 Long term (current) use of aspirin: Secondary | ICD-10-CM | POA: Diagnosis not present

## 2022-02-03 DIAGNOSIS — I129 Hypertensive chronic kidney disease with stage 1 through stage 4 chronic kidney disease, or unspecified chronic kidney disease: Secondary | ICD-10-CM | POA: Diagnosis not present

## 2022-02-03 DIAGNOSIS — Z7902 Long term (current) use of antithrombotics/antiplatelets: Secondary | ICD-10-CM | POA: Diagnosis not present

## 2022-02-03 DIAGNOSIS — G4733 Obstructive sleep apnea (adult) (pediatric): Secondary | ICD-10-CM | POA: Diagnosis not present

## 2022-02-03 DIAGNOSIS — Z9181 History of falling: Secondary | ICD-10-CM | POA: Diagnosis not present

## 2022-02-03 DIAGNOSIS — Z111 Encounter for screening for respiratory tuberculosis: Secondary | ICD-10-CM | POA: Diagnosis not present

## 2022-02-03 DIAGNOSIS — E1165 Type 2 diabetes mellitus with hyperglycemia: Secondary | ICD-10-CM | POA: Diagnosis not present

## 2022-02-03 DIAGNOSIS — R69 Illness, unspecified: Secondary | ICD-10-CM | POA: Diagnosis not present

## 2022-02-03 DIAGNOSIS — D631 Anemia in chronic kidney disease: Secondary | ICD-10-CM | POA: Diagnosis not present

## 2022-02-03 DIAGNOSIS — I69351 Hemiplegia and hemiparesis following cerebral infarction affecting right dominant side: Secondary | ICD-10-CM | POA: Diagnosis not present

## 2022-02-11 DIAGNOSIS — E1122 Type 2 diabetes mellitus with diabetic chronic kidney disease: Secondary | ICD-10-CM | POA: Diagnosis not present

## 2022-02-11 DIAGNOSIS — Z7902 Long term (current) use of antithrombotics/antiplatelets: Secondary | ICD-10-CM | POA: Diagnosis not present

## 2022-02-11 DIAGNOSIS — I69351 Hemiplegia and hemiparesis following cerebral infarction affecting right dominant side: Secondary | ICD-10-CM | POA: Diagnosis not present

## 2022-02-11 DIAGNOSIS — E1165 Type 2 diabetes mellitus with hyperglycemia: Secondary | ICD-10-CM | POA: Diagnosis not present

## 2022-02-11 DIAGNOSIS — Z7982 Long term (current) use of aspirin: Secondary | ICD-10-CM | POA: Diagnosis not present

## 2022-02-11 DIAGNOSIS — N184 Chronic kidney disease, stage 4 (severe): Secondary | ICD-10-CM | POA: Diagnosis not present

## 2022-02-11 DIAGNOSIS — G4733 Obstructive sleep apnea (adult) (pediatric): Secondary | ICD-10-CM | POA: Diagnosis not present

## 2022-02-11 DIAGNOSIS — R69 Illness, unspecified: Secondary | ICD-10-CM | POA: Diagnosis not present

## 2022-02-11 DIAGNOSIS — Z9181 History of falling: Secondary | ICD-10-CM | POA: Diagnosis not present

## 2022-02-11 DIAGNOSIS — R419 Unspecified symptoms and signs involving cognitive functions and awareness: Secondary | ICD-10-CM | POA: Diagnosis not present

## 2022-02-11 DIAGNOSIS — E785 Hyperlipidemia, unspecified: Secondary | ICD-10-CM | POA: Diagnosis not present

## 2022-02-11 DIAGNOSIS — I129 Hypertensive chronic kidney disease with stage 1 through stage 4 chronic kidney disease, or unspecified chronic kidney disease: Secondary | ICD-10-CM | POA: Diagnosis not present

## 2022-02-11 DIAGNOSIS — D631 Anemia in chronic kidney disease: Secondary | ICD-10-CM | POA: Diagnosis not present

## 2022-03-05 DIAGNOSIS — R69 Illness, unspecified: Secondary | ICD-10-CM | POA: Diagnosis not present

## 2022-03-05 DIAGNOSIS — I10 Essential (primary) hypertension: Secondary | ICD-10-CM | POA: Diagnosis not present

## 2022-03-05 DIAGNOSIS — I639 Cerebral infarction, unspecified: Secondary | ICD-10-CM | POA: Diagnosis not present

## 2022-03-05 DIAGNOSIS — R569 Unspecified convulsions: Secondary | ICD-10-CM | POA: Diagnosis not present

## 2022-03-08 DIAGNOSIS — H538 Other visual disturbances: Secondary | ICD-10-CM | POA: Diagnosis not present

## 2022-03-08 DIAGNOSIS — R2232 Localized swelling, mass and lump, left upper limb: Secondary | ICD-10-CM | POA: Diagnosis not present

## 2022-03-08 DIAGNOSIS — R569 Unspecified convulsions: Secondary | ICD-10-CM | POA: Diagnosis not present

## 2022-03-08 DIAGNOSIS — I10 Essential (primary) hypertension: Secondary | ICD-10-CM | POA: Diagnosis not present

## 2022-03-10 DIAGNOSIS — I10 Essential (primary) hypertension: Secondary | ICD-10-CM | POA: Diagnosis not present

## 2022-03-10 DIAGNOSIS — R569 Unspecified convulsions: Secondary | ICD-10-CM | POA: Diagnosis not present

## 2022-03-10 DIAGNOSIS — R229 Localized swelling, mass and lump, unspecified: Secondary | ICD-10-CM | POA: Diagnosis not present

## 2022-03-11 DIAGNOSIS — R059 Cough, unspecified: Secondary | ICD-10-CM | POA: Diagnosis not present

## 2022-03-11 DIAGNOSIS — R350 Frequency of micturition: Secondary | ICD-10-CM | POA: Diagnosis not present

## 2022-03-11 DIAGNOSIS — R3915 Urgency of urination: Secondary | ICD-10-CM | POA: Diagnosis not present

## 2022-03-11 DIAGNOSIS — R3911 Hesitancy of micturition: Secondary | ICD-10-CM | POA: Diagnosis not present

## 2022-03-12 DIAGNOSIS — N6332 Unspecified lump in axillary tail of the left breast: Secondary | ICD-10-CM | POA: Diagnosis not present

## 2022-03-12 DIAGNOSIS — E119 Type 2 diabetes mellitus without complications: Secondary | ICD-10-CM | POA: Diagnosis not present

## 2022-03-12 DIAGNOSIS — Z833 Family history of diabetes mellitus: Secondary | ICD-10-CM | POA: Diagnosis not present

## 2022-03-12 DIAGNOSIS — R3911 Hesitancy of micturition: Secondary | ICD-10-CM | POA: Diagnosis not present

## 2022-03-12 DIAGNOSIS — I10 Essential (primary) hypertension: Secondary | ICD-10-CM | POA: Diagnosis not present

## 2022-03-13 DIAGNOSIS — N3281 Overactive bladder: Secondary | ICD-10-CM | POA: Diagnosis not present

## 2022-03-13 DIAGNOSIS — G4089 Other seizures: Secondary | ICD-10-CM | POA: Diagnosis not present

## 2022-03-13 DIAGNOSIS — I10 Essential (primary) hypertension: Secondary | ICD-10-CM | POA: Diagnosis not present

## 2022-03-15 DIAGNOSIS — I10 Essential (primary) hypertension: Secondary | ICD-10-CM | POA: Diagnosis not present

## 2022-03-15 DIAGNOSIS — R569 Unspecified convulsions: Secondary | ICD-10-CM | POA: Diagnosis not present

## 2022-03-15 DIAGNOSIS — R059 Cough, unspecified: Secondary | ICD-10-CM | POA: Diagnosis not present

## 2022-03-16 DIAGNOSIS — I10 Essential (primary) hypertension: Secondary | ICD-10-CM | POA: Diagnosis not present

## 2022-03-16 DIAGNOSIS — R059 Cough, unspecified: Secondary | ICD-10-CM | POA: Diagnosis not present

## 2022-03-16 DIAGNOSIS — N3944 Nocturnal enuresis: Secondary | ICD-10-CM | POA: Diagnosis not present

## 2022-03-18 DIAGNOSIS — N390 Urinary tract infection, site not specified: Secondary | ICD-10-CM | POA: Diagnosis not present

## 2022-03-18 DIAGNOSIS — R350 Frequency of micturition: Secondary | ICD-10-CM | POA: Diagnosis not present

## 2022-03-23 DIAGNOSIS — N3281 Overactive bladder: Secondary | ICD-10-CM | POA: Diagnosis not present

## 2022-03-23 DIAGNOSIS — I10 Essential (primary) hypertension: Secondary | ICD-10-CM | POA: Diagnosis not present

## 2022-03-23 DIAGNOSIS — N6332 Unspecified lump in axillary tail of the left breast: Secondary | ICD-10-CM | POA: Diagnosis not present

## 2022-03-25 DIAGNOSIS — I10 Essential (primary) hypertension: Secondary | ICD-10-CM | POA: Diagnosis not present

## 2022-03-25 DIAGNOSIS — R569 Unspecified convulsions: Secondary | ICD-10-CM | POA: Diagnosis not present

## 2022-03-25 DIAGNOSIS — N3289 Other specified disorders of bladder: Secondary | ICD-10-CM | POA: Diagnosis not present

## 2022-04-06 DIAGNOSIS — N6332 Unspecified lump in axillary tail of the left breast: Secondary | ICD-10-CM | POA: Diagnosis not present

## 2022-04-06 DIAGNOSIS — E119 Type 2 diabetes mellitus without complications: Secondary | ICD-10-CM | POA: Diagnosis not present

## 2022-04-06 DIAGNOSIS — I10 Essential (primary) hypertension: Secondary | ICD-10-CM | POA: Diagnosis not present

## 2022-04-16 DIAGNOSIS — R040 Epistaxis: Secondary | ICD-10-CM | POA: Diagnosis not present

## 2022-04-16 DIAGNOSIS — I639 Cerebral infarction, unspecified: Secondary | ICD-10-CM | POA: Diagnosis not present

## 2022-04-16 DIAGNOSIS — I10 Essential (primary) hypertension: Secondary | ICD-10-CM | POA: Diagnosis not present

## 2022-04-19 DIAGNOSIS — D649 Anemia, unspecified: Secondary | ICD-10-CM | POA: Diagnosis not present

## 2022-04-30 DIAGNOSIS — I10 Essential (primary) hypertension: Secondary | ICD-10-CM | POA: Diagnosis not present

## 2022-04-30 DIAGNOSIS — R569 Unspecified convulsions: Secondary | ICD-10-CM | POA: Diagnosis not present

## 2022-04-30 DIAGNOSIS — R69 Illness, unspecified: Secondary | ICD-10-CM | POA: Diagnosis not present

## 2022-06-03 DIAGNOSIS — R569 Unspecified convulsions: Secondary | ICD-10-CM | POA: Diagnosis not present

## 2022-06-03 DIAGNOSIS — F339 Major depressive disorder, recurrent, unspecified: Secondary | ICD-10-CM | POA: Diagnosis not present

## 2022-06-03 DIAGNOSIS — I10 Essential (primary) hypertension: Secondary | ICD-10-CM | POA: Diagnosis not present

## 2022-06-11 DIAGNOSIS — R569 Unspecified convulsions: Secondary | ICD-10-CM | POA: Diagnosis not present

## 2022-06-11 DIAGNOSIS — N3289 Other specified disorders of bladder: Secondary | ICD-10-CM | POA: Diagnosis not present

## 2022-06-11 DIAGNOSIS — I10 Essential (primary) hypertension: Secondary | ICD-10-CM | POA: Diagnosis not present

## 2022-06-24 DIAGNOSIS — I10 Essential (primary) hypertension: Secondary | ICD-10-CM | POA: Diagnosis not present

## 2022-06-24 DIAGNOSIS — R569 Unspecified convulsions: Secondary | ICD-10-CM | POA: Diagnosis not present

## 2022-06-24 DIAGNOSIS — N3289 Other specified disorders of bladder: Secondary | ICD-10-CM | POA: Diagnosis not present

## 2022-07-09 DIAGNOSIS — I10 Essential (primary) hypertension: Secondary | ICD-10-CM | POA: Diagnosis not present

## 2022-07-09 DIAGNOSIS — R569 Unspecified convulsions: Secondary | ICD-10-CM | POA: Diagnosis not present

## 2022-07-13 DIAGNOSIS — I10 Essential (primary) hypertension: Secondary | ICD-10-CM | POA: Diagnosis not present

## 2022-07-13 DIAGNOSIS — F339 Major depressive disorder, recurrent, unspecified: Secondary | ICD-10-CM | POA: Diagnosis not present

## 2022-07-13 DIAGNOSIS — G4089 Other seizures: Secondary | ICD-10-CM | POA: Diagnosis not present

## 2022-07-29 DIAGNOSIS — R609 Edema, unspecified: Secondary | ICD-10-CM | POA: Diagnosis not present

## 2022-07-29 DIAGNOSIS — I10 Essential (primary) hypertension: Secondary | ICD-10-CM | POA: Diagnosis not present

## 2022-07-29 DIAGNOSIS — I639 Cerebral infarction, unspecified: Secondary | ICD-10-CM | POA: Diagnosis not present

## 2022-07-30 DIAGNOSIS — R609 Edema, unspecified: Secondary | ICD-10-CM | POA: Diagnosis not present

## 2022-07-31 DIAGNOSIS — R600 Localized edema: Secondary | ICD-10-CM | POA: Diagnosis not present

## 2022-07-31 DIAGNOSIS — R609 Edema, unspecified: Secondary | ICD-10-CM | POA: Diagnosis not present

## 2022-07-31 DIAGNOSIS — I10 Essential (primary) hypertension: Secondary | ICD-10-CM | POA: Diagnosis not present

## 2022-07-31 DIAGNOSIS — R569 Unspecified convulsions: Secondary | ICD-10-CM | POA: Diagnosis not present

## 2022-08-05 DIAGNOSIS — R569 Unspecified convulsions: Secondary | ICD-10-CM | POA: Diagnosis not present

## 2022-08-05 DIAGNOSIS — H2513 Age-related nuclear cataract, bilateral: Secondary | ICD-10-CM | POA: Diagnosis not present

## 2022-08-05 DIAGNOSIS — I10 Essential (primary) hypertension: Secondary | ICD-10-CM | POA: Diagnosis not present

## 2022-08-05 DIAGNOSIS — H18413 Arcus senilis, bilateral: Secondary | ICD-10-CM | POA: Diagnosis not present

## 2022-08-05 DIAGNOSIS — E1139 Type 2 diabetes mellitus with other diabetic ophthalmic complication: Secondary | ICD-10-CM | POA: Diagnosis not present

## 2022-08-05 DIAGNOSIS — H43813 Vitreous degeneration, bilateral: Secondary | ICD-10-CM | POA: Diagnosis not present

## 2022-08-09 DIAGNOSIS — I10 Essential (primary) hypertension: Secondary | ICD-10-CM | POA: Diagnosis not present

## 2022-08-09 DIAGNOSIS — N3281 Overactive bladder: Secondary | ICD-10-CM | POA: Diagnosis not present

## 2022-08-09 DIAGNOSIS — R609 Edema, unspecified: Secondary | ICD-10-CM | POA: Diagnosis not present

## 2022-09-10 DIAGNOSIS — R569 Unspecified convulsions: Secondary | ICD-10-CM | POA: Diagnosis not present

## 2022-09-10 DIAGNOSIS — I639 Cerebral infarction, unspecified: Secondary | ICD-10-CM | POA: Diagnosis not present

## 2022-09-10 DIAGNOSIS — I10 Essential (primary) hypertension: Secondary | ICD-10-CM | POA: Diagnosis not present

## 2022-10-04 DIAGNOSIS — I639 Cerebral infarction, unspecified: Secondary | ICD-10-CM | POA: Diagnosis not present

## 2022-10-04 DIAGNOSIS — M545 Low back pain, unspecified: Secondary | ICD-10-CM | POA: Diagnosis not present

## 2022-10-04 DIAGNOSIS — I10 Essential (primary) hypertension: Secondary | ICD-10-CM | POA: Diagnosis not present

## 2022-10-05 DIAGNOSIS — M533 Sacrococcygeal disorders, not elsewhere classified: Secondary | ICD-10-CM | POA: Diagnosis not present

## 2022-10-05 DIAGNOSIS — M47816 Spondylosis without myelopathy or radiculopathy, lumbar region: Secondary | ICD-10-CM | POA: Diagnosis not present

## 2022-10-08 DIAGNOSIS — N3281 Overactive bladder: Secondary | ICD-10-CM | POA: Diagnosis not present

## 2022-10-08 DIAGNOSIS — R569 Unspecified convulsions: Secondary | ICD-10-CM | POA: Diagnosis not present

## 2022-10-08 DIAGNOSIS — I10 Essential (primary) hypertension: Secondary | ICD-10-CM | POA: Diagnosis not present

## 2022-10-10 DIAGNOSIS — I10 Essential (primary) hypertension: Secondary | ICD-10-CM | POA: Diagnosis not present

## 2022-10-10 DIAGNOSIS — R1312 Dysphagia, oropharyngeal phase: Secondary | ICD-10-CM | POA: Diagnosis not present

## 2022-10-10 DIAGNOSIS — K047 Periapical abscess without sinus: Secondary | ICD-10-CM | POA: Diagnosis not present

## 2022-10-12 DIAGNOSIS — F339 Major depressive disorder, recurrent, unspecified: Secondary | ICD-10-CM | POA: Diagnosis not present

## 2022-10-12 DIAGNOSIS — K047 Periapical abscess without sinus: Secondary | ICD-10-CM | POA: Diagnosis not present

## 2022-10-12 DIAGNOSIS — I10 Essential (primary) hypertension: Secondary | ICD-10-CM | POA: Diagnosis not present

## 2022-11-04 DIAGNOSIS — I10 Essential (primary) hypertension: Secondary | ICD-10-CM | POA: Diagnosis not present

## 2022-11-04 DIAGNOSIS — R2232 Localized swelling, mass and lump, left upper limb: Secondary | ICD-10-CM | POA: Diagnosis not present

## 2022-11-09 ENCOUNTER — Ambulatory Visit: Payer: Medicare HMO | Admitting: Nurse Practitioner

## 2022-11-09 DIAGNOSIS — N6332 Unspecified lump in axillary tail of the left breast: Secondary | ICD-10-CM | POA: Diagnosis not present

## 2022-11-09 DIAGNOSIS — F32A Depression, unspecified: Secondary | ICD-10-CM | POA: Diagnosis not present

## 2022-11-09 DIAGNOSIS — N3281 Overactive bladder: Secondary | ICD-10-CM | POA: Diagnosis not present

## 2022-11-09 DIAGNOSIS — E785 Hyperlipidemia, unspecified: Secondary | ICD-10-CM | POA: Diagnosis not present

## 2022-11-09 DIAGNOSIS — Z8673 Personal history of transient ischemic attack (TIA), and cerebral infarction without residual deficits: Secondary | ICD-10-CM | POA: Diagnosis not present

## 2022-11-09 DIAGNOSIS — Z7982 Long term (current) use of aspirin: Secondary | ICD-10-CM | POA: Diagnosis not present

## 2022-11-09 DIAGNOSIS — Z7902 Long term (current) use of antithrombotics/antiplatelets: Secondary | ICD-10-CM | POA: Diagnosis not present

## 2022-11-09 DIAGNOSIS — I10 Essential (primary) hypertension: Secondary | ICD-10-CM | POA: Diagnosis not present

## 2022-11-09 DIAGNOSIS — Z885 Allergy status to narcotic agent status: Secondary | ICD-10-CM | POA: Diagnosis not present

## 2022-11-09 DIAGNOSIS — Z79899 Other long term (current) drug therapy: Secondary | ICD-10-CM | POA: Diagnosis not present

## 2022-11-09 DIAGNOSIS — G4089 Other seizures: Secondary | ICD-10-CM | POA: Diagnosis not present

## 2022-11-09 DIAGNOSIS — E119 Type 2 diabetes mellitus without complications: Secondary | ICD-10-CM | POA: Diagnosis not present

## 2022-11-09 DIAGNOSIS — L728 Other follicular cysts of the skin and subcutaneous tissue: Secondary | ICD-10-CM | POA: Diagnosis not present

## 2022-11-17 ENCOUNTER — Other Ambulatory Visit: Payer: Self-pay

## 2022-11-23 DIAGNOSIS — S45211A Laceration of axillary or brachial vein, right side, initial encounter: Secondary | ICD-10-CM | POA: Diagnosis not present

## 2022-11-23 DIAGNOSIS — R569 Unspecified convulsions: Secondary | ICD-10-CM | POA: Diagnosis not present

## 2022-11-23 DIAGNOSIS — I10 Essential (primary) hypertension: Secondary | ICD-10-CM | POA: Diagnosis not present

## 2022-11-29 DIAGNOSIS — I10 Essential (primary) hypertension: Secondary | ICD-10-CM | POA: Diagnosis not present

## 2022-11-29 DIAGNOSIS — D649 Anemia, unspecified: Secondary | ICD-10-CM | POA: Diagnosis not present

## 2022-12-02 DIAGNOSIS — I10 Essential (primary) hypertension: Secondary | ICD-10-CM | POA: Diagnosis not present

## 2022-12-02 DIAGNOSIS — D649 Anemia, unspecified: Secondary | ICD-10-CM | POA: Diagnosis not present

## 2022-12-02 DIAGNOSIS — N183 Chronic kidney disease, stage 3 unspecified: Secondary | ICD-10-CM | POA: Diagnosis not present

## 2022-12-06 DIAGNOSIS — I10 Essential (primary) hypertension: Secondary | ICD-10-CM | POA: Diagnosis not present

## 2022-12-06 DIAGNOSIS — R569 Unspecified convulsions: Secondary | ICD-10-CM | POA: Diagnosis not present

## 2022-12-30 DIAGNOSIS — I10 Essential (primary) hypertension: Secondary | ICD-10-CM | POA: Diagnosis not present

## 2022-12-30 DIAGNOSIS — R569 Unspecified convulsions: Secondary | ICD-10-CM | POA: Diagnosis not present

## 2023-01-17 DIAGNOSIS — I10 Essential (primary) hypertension: Secondary | ICD-10-CM | POA: Diagnosis not present

## 2023-01-17 DIAGNOSIS — R569 Unspecified convulsions: Secondary | ICD-10-CM | POA: Diagnosis not present

## 2023-01-18 DIAGNOSIS — I10 Essential (primary) hypertension: Secondary | ICD-10-CM | POA: Diagnosis not present

## 2023-01-18 DIAGNOSIS — R569 Unspecified convulsions: Secondary | ICD-10-CM | POA: Diagnosis not present

## 2023-02-01 DIAGNOSIS — K922 Gastrointestinal hemorrhage, unspecified: Secondary | ICD-10-CM | POA: Diagnosis not present

## 2023-02-01 DIAGNOSIS — R531 Weakness: Secondary | ICD-10-CM | POA: Diagnosis not present

## 2023-02-01 DIAGNOSIS — Z743 Need for continuous supervision: Secondary | ICD-10-CM | POA: Diagnosis not present

## 2023-02-01 DIAGNOSIS — I10 Essential (primary) hypertension: Secondary | ICD-10-CM | POA: Diagnosis not present

## 2023-02-01 DIAGNOSIS — N189 Chronic kidney disease, unspecified: Secondary | ICD-10-CM | POA: Diagnosis not present

## 2023-02-01 DIAGNOSIS — I639 Cerebral infarction, unspecified: Secondary | ICD-10-CM | POA: Diagnosis not present

## 2023-02-02 DIAGNOSIS — D125 Benign neoplasm of sigmoid colon: Secondary | ICD-10-CM | POA: Diagnosis not present

## 2023-02-02 DIAGNOSIS — K625 Hemorrhage of anus and rectum: Secondary | ICD-10-CM | POA: Diagnosis not present

## 2023-02-02 DIAGNOSIS — N189 Chronic kidney disease, unspecified: Secondary | ICD-10-CM | POA: Diagnosis not present

## 2023-02-02 DIAGNOSIS — I639 Cerebral infarction, unspecified: Secondary | ICD-10-CM | POA: Diagnosis not present

## 2023-02-02 DIAGNOSIS — I169 Hypertensive crisis, unspecified: Secondary | ICD-10-CM | POA: Diagnosis not present

## 2023-02-02 DIAGNOSIS — I10 Essential (primary) hypertension: Secondary | ICD-10-CM | POA: Diagnosis not present

## 2023-02-03 DIAGNOSIS — K625 Hemorrhage of anus and rectum: Secondary | ICD-10-CM | POA: Diagnosis not present

## 2023-02-03 DIAGNOSIS — N189 Chronic kidney disease, unspecified: Secondary | ICD-10-CM | POA: Diagnosis not present

## 2023-02-03 DIAGNOSIS — I639 Cerebral infarction, unspecified: Secondary | ICD-10-CM | POA: Diagnosis not present

## 2023-02-03 DIAGNOSIS — I10 Essential (primary) hypertension: Secondary | ICD-10-CM | POA: Diagnosis not present

## 2023-02-03 DIAGNOSIS — I169 Hypertensive crisis, unspecified: Secondary | ICD-10-CM | POA: Diagnosis not present

## 2023-02-03 DIAGNOSIS — D125 Benign neoplasm of sigmoid colon: Secondary | ICD-10-CM | POA: Diagnosis not present

## 2023-02-03 DIAGNOSIS — K922 Gastrointestinal hemorrhage, unspecified: Secondary | ICD-10-CM | POA: Diagnosis not present

## 2023-02-03 DIAGNOSIS — C187 Malignant neoplasm of sigmoid colon: Secondary | ICD-10-CM | POA: Diagnosis not present

## 2023-02-04 DIAGNOSIS — N189 Chronic kidney disease, unspecified: Secondary | ICD-10-CM | POA: Diagnosis not present

## 2023-02-04 DIAGNOSIS — D649 Anemia, unspecified: Secondary | ICD-10-CM | POA: Diagnosis not present

## 2023-02-04 DIAGNOSIS — I10 Essential (primary) hypertension: Secondary | ICD-10-CM | POA: Diagnosis not present

## 2023-02-10 DIAGNOSIS — Z743 Need for continuous supervision: Secondary | ICD-10-CM | POA: Diagnosis not present

## 2023-02-10 DIAGNOSIS — Z7902 Long term (current) use of antithrombotics/antiplatelets: Secondary | ICD-10-CM | POA: Diagnosis not present

## 2023-02-10 DIAGNOSIS — K639 Disease of intestine, unspecified: Secondary | ICD-10-CM | POA: Diagnosis not present

## 2023-02-10 DIAGNOSIS — E66813 Obesity, class 3: Secondary | ICD-10-CM | POA: Diagnosis not present

## 2023-02-10 DIAGNOSIS — Z79899 Other long term (current) drug therapy: Secondary | ICD-10-CM | POA: Diagnosis not present

## 2023-02-10 DIAGNOSIS — D649 Anemia, unspecified: Secondary | ICD-10-CM | POA: Diagnosis not present

## 2023-02-10 DIAGNOSIS — N189 Chronic kidney disease, unspecified: Secondary | ICD-10-CM | POA: Diagnosis not present

## 2023-02-10 DIAGNOSIS — K91840 Postprocedural hemorrhage and hematoma of a digestive system organ or structure following a digestive system procedure: Secondary | ICD-10-CM | POA: Diagnosis not present

## 2023-02-10 DIAGNOSIS — I129 Hypertensive chronic kidney disease with stage 1 through stage 4 chronic kidney disease, or unspecified chronic kidney disease: Secondary | ICD-10-CM | POA: Diagnosis not present

## 2023-02-10 DIAGNOSIS — K922 Gastrointestinal hemorrhage, unspecified: Secondary | ICD-10-CM | POA: Diagnosis not present

## 2023-02-10 DIAGNOSIS — D62 Acute posthemorrhagic anemia: Secondary | ICD-10-CM | POA: Diagnosis not present

## 2023-02-10 DIAGNOSIS — I517 Cardiomegaly: Secondary | ICD-10-CM | POA: Diagnosis not present

## 2023-02-11 DIAGNOSIS — N179 Acute kidney failure, unspecified: Secondary | ICD-10-CM | POA: Diagnosis not present

## 2023-02-11 DIAGNOSIS — D509 Iron deficiency anemia, unspecified: Secondary | ICD-10-CM | POA: Diagnosis not present

## 2023-02-11 DIAGNOSIS — K639 Disease of intestine, unspecified: Secondary | ICD-10-CM | POA: Diagnosis not present

## 2023-02-11 DIAGNOSIS — K922 Gastrointestinal hemorrhage, unspecified: Secondary | ICD-10-CM | POA: Diagnosis not present

## 2023-02-11 DIAGNOSIS — E785 Hyperlipidemia, unspecified: Secondary | ICD-10-CM | POA: Diagnosis not present

## 2023-02-11 DIAGNOSIS — I129 Hypertensive chronic kidney disease with stage 1 through stage 4 chronic kidney disease, or unspecified chronic kidney disease: Secondary | ICD-10-CM | POA: Diagnosis not present

## 2023-02-11 DIAGNOSIS — C187 Malignant neoplasm of sigmoid colon: Secondary | ICD-10-CM | POA: Diagnosis not present

## 2023-02-11 DIAGNOSIS — N184 Chronic kidney disease, stage 4 (severe): Secondary | ICD-10-CM | POA: Diagnosis not present

## 2023-02-11 DIAGNOSIS — D649 Anemia, unspecified: Secondary | ICD-10-CM | POA: Diagnosis not present

## 2023-02-12 DIAGNOSIS — I129 Hypertensive chronic kidney disease with stage 1 through stage 4 chronic kidney disease, or unspecified chronic kidney disease: Secondary | ICD-10-CM | POA: Diagnosis not present

## 2023-02-12 DIAGNOSIS — E785 Hyperlipidemia, unspecified: Secondary | ICD-10-CM | POA: Diagnosis not present

## 2023-02-12 DIAGNOSIS — N184 Chronic kidney disease, stage 4 (severe): Secondary | ICD-10-CM | POA: Diagnosis not present

## 2023-02-12 DIAGNOSIS — N179 Acute kidney failure, unspecified: Secondary | ICD-10-CM | POA: Diagnosis not present

## 2023-02-12 DIAGNOSIS — D509 Iron deficiency anemia, unspecified: Secondary | ICD-10-CM | POA: Diagnosis not present

## 2023-02-12 DIAGNOSIS — C187 Malignant neoplasm of sigmoid colon: Secondary | ICD-10-CM | POA: Diagnosis not present

## 2023-02-13 DIAGNOSIS — D509 Iron deficiency anemia, unspecified: Secondary | ICD-10-CM | POA: Diagnosis not present

## 2023-02-13 DIAGNOSIS — E785 Hyperlipidemia, unspecified: Secondary | ICD-10-CM | POA: Diagnosis not present

## 2023-02-13 DIAGNOSIS — C187 Malignant neoplasm of sigmoid colon: Secondary | ICD-10-CM | POA: Diagnosis not present

## 2023-02-13 DIAGNOSIS — N179 Acute kidney failure, unspecified: Secondary | ICD-10-CM | POA: Diagnosis not present

## 2023-02-13 DIAGNOSIS — D649 Anemia, unspecified: Secondary | ICD-10-CM | POA: Diagnosis not present

## 2023-02-13 DIAGNOSIS — N184 Chronic kidney disease, stage 4 (severe): Secondary | ICD-10-CM | POA: Diagnosis not present

## 2023-02-13 DIAGNOSIS — I129 Hypertensive chronic kidney disease with stage 1 through stage 4 chronic kidney disease, or unspecified chronic kidney disease: Secondary | ICD-10-CM | POA: Diagnosis not present

## 2023-02-14 DIAGNOSIS — C187 Malignant neoplasm of sigmoid colon: Secondary | ICD-10-CM | POA: Diagnosis not present

## 2023-02-14 DIAGNOSIS — Z9889 Other specified postprocedural states: Secondary | ICD-10-CM | POA: Diagnosis not present

## 2023-02-14 DIAGNOSIS — D631 Anemia in chronic kidney disease: Secondary | ICD-10-CM | POA: Diagnosis not present

## 2023-02-14 DIAGNOSIS — N189 Chronic kidney disease, unspecified: Secondary | ICD-10-CM | POA: Diagnosis not present

## 2023-02-14 DIAGNOSIS — E785 Hyperlipidemia, unspecified: Secondary | ICD-10-CM | POA: Diagnosis not present

## 2023-02-14 DIAGNOSIS — D509 Iron deficiency anemia, unspecified: Secondary | ICD-10-CM | POA: Diagnosis not present

## 2023-02-14 DIAGNOSIS — I639 Cerebral infarction, unspecified: Secondary | ICD-10-CM | POA: Diagnosis not present

## 2023-02-14 DIAGNOSIS — K633 Ulcer of intestine: Secondary | ICD-10-CM | POA: Diagnosis not present

## 2023-02-14 DIAGNOSIS — N179 Acute kidney failure, unspecified: Secondary | ICD-10-CM | POA: Diagnosis not present

## 2023-02-14 DIAGNOSIS — N184 Chronic kidney disease, stage 4 (severe): Secondary | ICD-10-CM | POA: Diagnosis not present

## 2023-02-14 DIAGNOSIS — I129 Hypertensive chronic kidney disease with stage 1 through stage 4 chronic kidney disease, or unspecified chronic kidney disease: Secondary | ICD-10-CM | POA: Diagnosis not present

## 2023-02-15 DIAGNOSIS — N184 Chronic kidney disease, stage 4 (severe): Secondary | ICD-10-CM | POA: Diagnosis not present

## 2023-02-15 DIAGNOSIS — E785 Hyperlipidemia, unspecified: Secondary | ICD-10-CM | POA: Diagnosis not present

## 2023-02-15 DIAGNOSIS — I129 Hypertensive chronic kidney disease with stage 1 through stage 4 chronic kidney disease, or unspecified chronic kidney disease: Secondary | ICD-10-CM | POA: Diagnosis not present

## 2023-02-15 DIAGNOSIS — C187 Malignant neoplasm of sigmoid colon: Secondary | ICD-10-CM | POA: Diagnosis not present

## 2023-02-15 DIAGNOSIS — N179 Acute kidney failure, unspecified: Secondary | ICD-10-CM | POA: Diagnosis not present

## 2023-02-15 DIAGNOSIS — D509 Iron deficiency anemia, unspecified: Secondary | ICD-10-CM | POA: Diagnosis not present

## 2023-02-16 DIAGNOSIS — N179 Acute kidney failure, unspecified: Secondary | ICD-10-CM | POA: Diagnosis not present

## 2023-02-16 DIAGNOSIS — C187 Malignant neoplasm of sigmoid colon: Secondary | ICD-10-CM | POA: Diagnosis not present

## 2023-02-16 DIAGNOSIS — I129 Hypertensive chronic kidney disease with stage 1 through stage 4 chronic kidney disease, or unspecified chronic kidney disease: Secondary | ICD-10-CM | POA: Diagnosis not present

## 2023-02-16 DIAGNOSIS — N184 Chronic kidney disease, stage 4 (severe): Secondary | ICD-10-CM | POA: Diagnosis not present

## 2023-02-16 DIAGNOSIS — D649 Anemia, unspecified: Secondary | ICD-10-CM | POA: Diagnosis not present

## 2023-02-16 DIAGNOSIS — E785 Hyperlipidemia, unspecified: Secondary | ICD-10-CM | POA: Diagnosis not present

## 2023-02-16 DIAGNOSIS — D509 Iron deficiency anemia, unspecified: Secondary | ICD-10-CM | POA: Diagnosis not present

## 2023-02-16 DIAGNOSIS — K921 Melena: Secondary | ICD-10-CM | POA: Diagnosis not present

## 2023-02-17 DIAGNOSIS — C187 Malignant neoplasm of sigmoid colon: Secondary | ICD-10-CM | POA: Diagnosis not present

## 2023-02-17 DIAGNOSIS — D509 Iron deficiency anemia, unspecified: Secondary | ICD-10-CM | POA: Diagnosis not present

## 2023-02-17 DIAGNOSIS — N184 Chronic kidney disease, stage 4 (severe): Secondary | ICD-10-CM | POA: Diagnosis not present

## 2023-02-17 DIAGNOSIS — I129 Hypertensive chronic kidney disease with stage 1 through stage 4 chronic kidney disease, or unspecified chronic kidney disease: Secondary | ICD-10-CM | POA: Diagnosis not present

## 2023-02-17 DIAGNOSIS — E785 Hyperlipidemia, unspecified: Secondary | ICD-10-CM | POA: Diagnosis not present

## 2023-02-17 DIAGNOSIS — N179 Acute kidney failure, unspecified: Secondary | ICD-10-CM | POA: Diagnosis not present

## 2023-02-18 DIAGNOSIS — N184 Chronic kidney disease, stage 4 (severe): Secondary | ICD-10-CM | POA: Diagnosis not present

## 2023-02-18 DIAGNOSIS — D509 Iron deficiency anemia, unspecified: Secondary | ICD-10-CM | POA: Diagnosis not present

## 2023-02-18 DIAGNOSIS — E785 Hyperlipidemia, unspecified: Secondary | ICD-10-CM | POA: Diagnosis not present

## 2023-02-18 DIAGNOSIS — J811 Chronic pulmonary edema: Secondary | ICD-10-CM | POA: Diagnosis not present

## 2023-02-18 DIAGNOSIS — N179 Acute kidney failure, unspecified: Secondary | ICD-10-CM | POA: Diagnosis not present

## 2023-02-18 DIAGNOSIS — R918 Other nonspecific abnormal finding of lung field: Secondary | ICD-10-CM | POA: Diagnosis not present

## 2023-02-18 DIAGNOSIS — I129 Hypertensive chronic kidney disease with stage 1 through stage 4 chronic kidney disease, or unspecified chronic kidney disease: Secondary | ICD-10-CM | POA: Diagnosis not present

## 2023-02-18 DIAGNOSIS — K922 Gastrointestinal hemorrhage, unspecified: Secondary | ICD-10-CM | POA: Diagnosis not present

## 2023-02-18 DIAGNOSIS — C187 Malignant neoplasm of sigmoid colon: Secondary | ICD-10-CM | POA: Diagnosis not present

## 2023-02-19 DIAGNOSIS — D125 Benign neoplasm of sigmoid colon: Secondary | ICD-10-CM | POA: Diagnosis not present

## 2023-02-19 DIAGNOSIS — M6281 Muscle weakness (generalized): Secondary | ICD-10-CM | POA: Diagnosis not present

## 2023-02-19 DIAGNOSIS — R262 Difficulty in walking, not elsewhere classified: Secondary | ICD-10-CM | POA: Diagnosis not present

## 2023-02-19 DIAGNOSIS — Z6841 Body Mass Index (BMI) 40.0 and over, adult: Secondary | ICD-10-CM | POA: Diagnosis not present

## 2023-02-19 DIAGNOSIS — I639 Cerebral infarction, unspecified: Secondary | ICD-10-CM | POA: Diagnosis not present

## 2023-02-19 DIAGNOSIS — I6930 Unspecified sequelae of cerebral infarction: Secondary | ICD-10-CM | POA: Diagnosis not present

## 2023-02-19 DIAGNOSIS — C187 Malignant neoplasm of sigmoid colon: Secondary | ICD-10-CM | POA: Diagnosis not present

## 2023-02-19 DIAGNOSIS — I10 Essential (primary) hypertension: Secondary | ICD-10-CM | POA: Diagnosis not present

## 2023-02-19 DIAGNOSIS — D649 Anemia, unspecified: Secondary | ICD-10-CM | POA: Diagnosis not present

## 2023-02-19 DIAGNOSIS — F79 Unspecified intellectual disabilities: Secondary | ICD-10-CM | POA: Diagnosis not present

## 2023-02-19 DIAGNOSIS — N179 Acute kidney failure, unspecified: Secondary | ICD-10-CM | POA: Diagnosis not present

## 2023-02-19 DIAGNOSIS — K625 Hemorrhage of anus and rectum: Secondary | ICD-10-CM | POA: Diagnosis not present

## 2023-02-19 DIAGNOSIS — Z713 Dietary counseling and surveillance: Secondary | ICD-10-CM | POA: Diagnosis not present

## 2023-02-19 DIAGNOSIS — K922 Gastrointestinal hemorrhage, unspecified: Secondary | ICD-10-CM | POA: Diagnosis not present

## 2023-02-19 DIAGNOSIS — N3281 Overactive bladder: Secondary | ICD-10-CM | POA: Diagnosis not present

## 2023-02-19 DIAGNOSIS — R41841 Cognitive communication deficit: Secondary | ICD-10-CM | POA: Diagnosis not present

## 2023-02-19 DIAGNOSIS — I69818 Other symptoms and signs involving cognitive functions following other cerebrovascular disease: Secondary | ICD-10-CM | POA: Diagnosis not present

## 2023-02-21 DIAGNOSIS — K922 Gastrointestinal hemorrhage, unspecified: Secondary | ICD-10-CM | POA: Diagnosis not present

## 2023-02-21 DIAGNOSIS — C187 Malignant neoplasm of sigmoid colon: Secondary | ICD-10-CM | POA: Diagnosis not present

## 2023-02-22 DIAGNOSIS — N179 Acute kidney failure, unspecified: Secondary | ICD-10-CM | POA: Diagnosis not present

## 2023-02-22 DIAGNOSIS — D649 Anemia, unspecified: Secondary | ICD-10-CM | POA: Diagnosis not present

## 2023-03-01 DIAGNOSIS — K625 Hemorrhage of anus and rectum: Secondary | ICD-10-CM | POA: Diagnosis not present

## 2023-03-01 DIAGNOSIS — C187 Malignant neoplasm of sigmoid colon: Secondary | ICD-10-CM | POA: Diagnosis not present

## 2023-03-01 DIAGNOSIS — D125 Benign neoplasm of sigmoid colon: Secondary | ICD-10-CM | POA: Diagnosis not present

## 2023-03-01 DIAGNOSIS — Z713 Dietary counseling and surveillance: Secondary | ICD-10-CM | POA: Diagnosis not present

## 2023-03-02 DIAGNOSIS — C187 Malignant neoplasm of sigmoid colon: Secondary | ICD-10-CM | POA: Diagnosis not present

## 2023-03-02 DIAGNOSIS — I10 Essential (primary) hypertension: Secondary | ICD-10-CM | POA: Diagnosis not present

## 2023-03-07 DIAGNOSIS — I10 Essential (primary) hypertension: Secondary | ICD-10-CM | POA: Diagnosis not present

## 2023-03-07 DIAGNOSIS — D631 Anemia in chronic kidney disease: Secondary | ICD-10-CM | POA: Diagnosis not present

## 2023-03-10 DIAGNOSIS — I10 Essential (primary) hypertension: Secondary | ICD-10-CM | POA: Diagnosis not present

## 2023-03-10 DIAGNOSIS — R040 Epistaxis: Secondary | ICD-10-CM | POA: Diagnosis not present

## 2023-03-14 DIAGNOSIS — D649 Anemia, unspecified: Secondary | ICD-10-CM | POA: Diagnosis not present

## 2023-03-14 DIAGNOSIS — I10 Essential (primary) hypertension: Secondary | ICD-10-CM | POA: Diagnosis not present

## 2023-03-15 DIAGNOSIS — I10 Essential (primary) hypertension: Secondary | ICD-10-CM | POA: Diagnosis not present

## 2023-03-15 DIAGNOSIS — D649 Anemia, unspecified: Secondary | ICD-10-CM | POA: Diagnosis not present

## 2023-03-16 DIAGNOSIS — I10 Essential (primary) hypertension: Secondary | ICD-10-CM | POA: Diagnosis not present

## 2023-03-16 DIAGNOSIS — C187 Malignant neoplasm of sigmoid colon: Secondary | ICD-10-CM | POA: Diagnosis not present

## 2023-03-16 DIAGNOSIS — Z7901 Long term (current) use of anticoagulants: Secondary | ICD-10-CM | POA: Diagnosis not present

## 2023-03-16 DIAGNOSIS — D649 Anemia, unspecified: Secondary | ICD-10-CM | POA: Diagnosis not present

## 2023-03-21 DIAGNOSIS — I517 Cardiomegaly: Secondary | ICD-10-CM | POA: Diagnosis not present

## 2023-03-21 DIAGNOSIS — C189 Malignant neoplasm of colon, unspecified: Secondary | ICD-10-CM | POA: Diagnosis not present

## 2023-03-22 DIAGNOSIS — I10 Essential (primary) hypertension: Secondary | ICD-10-CM | POA: Diagnosis not present

## 2023-03-22 DIAGNOSIS — Z8673 Personal history of transient ischemic attack (TIA), and cerebral infarction without residual deficits: Secondary | ICD-10-CM | POA: Diagnosis not present

## 2023-03-22 DIAGNOSIS — I129 Hypertensive chronic kidney disease with stage 1 through stage 4 chronic kidney disease, or unspecified chronic kidney disease: Secondary | ICD-10-CM | POA: Diagnosis not present

## 2023-03-22 DIAGNOSIS — Z01810 Encounter for preprocedural cardiovascular examination: Secondary | ICD-10-CM | POA: Diagnosis not present

## 2023-03-22 DIAGNOSIS — C187 Malignant neoplasm of sigmoid colon: Secondary | ICD-10-CM | POA: Diagnosis not present

## 2023-03-22 DIAGNOSIS — Z01818 Encounter for other preprocedural examination: Secondary | ICD-10-CM | POA: Diagnosis not present

## 2023-03-22 DIAGNOSIS — N184 Chronic kidney disease, stage 4 (severe): Secondary | ICD-10-CM | POA: Diagnosis not present

## 2023-03-24 DIAGNOSIS — C187 Malignant neoplasm of sigmoid colon: Secondary | ICD-10-CM | POA: Diagnosis not present

## 2023-04-27 DIAGNOSIS — C187 Malignant neoplasm of sigmoid colon: Secondary | ICD-10-CM | POA: Diagnosis not present

## 2023-05-18 DIAGNOSIS — R059 Cough, unspecified: Secondary | ICD-10-CM | POA: Diagnosis not present

## 2023-05-18 DIAGNOSIS — J209 Acute bronchitis, unspecified: Secondary | ICD-10-CM | POA: Diagnosis not present

## 2023-05-18 DIAGNOSIS — R0989 Other specified symptoms and signs involving the circulatory and respiratory systems: Secondary | ICD-10-CM | POA: Diagnosis not present

## 2023-05-19 DIAGNOSIS — R059 Cough, unspecified: Secondary | ICD-10-CM | POA: Diagnosis not present

## 2023-05-19 DIAGNOSIS — R0989 Other specified symptoms and signs involving the circulatory and respiratory systems: Secondary | ICD-10-CM | POA: Diagnosis not present

## 2023-05-19 DIAGNOSIS — R0602 Shortness of breath: Secondary | ICD-10-CM | POA: Diagnosis not present

## 2023-05-24 DIAGNOSIS — Z6841 Body Mass Index (BMI) 40.0 and over, adult: Secondary | ICD-10-CM | POA: Diagnosis not present

## 2023-05-24 DIAGNOSIS — C187 Malignant neoplasm of sigmoid colon: Secondary | ICD-10-CM | POA: Diagnosis not present

## 2023-05-24 DIAGNOSIS — K922 Gastrointestinal hemorrhage, unspecified: Secondary | ICD-10-CM | POA: Diagnosis not present

## 2023-06-07 DIAGNOSIS — Z20828 Contact with and (suspected) exposure to other viral communicable diseases: Secondary | ICD-10-CM | POA: Diagnosis not present

## 2023-06-08 DIAGNOSIS — R609 Edema, unspecified: Secondary | ICD-10-CM | POA: Diagnosis not present

## 2023-06-08 DIAGNOSIS — R569 Unspecified convulsions: Secondary | ICD-10-CM | POA: Diagnosis not present

## 2023-06-08 DIAGNOSIS — I10 Essential (primary) hypertension: Secondary | ICD-10-CM | POA: Diagnosis not present

## 2023-06-17 DIAGNOSIS — R609 Edema, unspecified: Secondary | ICD-10-CM | POA: Diagnosis not present

## 2023-06-17 DIAGNOSIS — I10 Essential (primary) hypertension: Secondary | ICD-10-CM | POA: Diagnosis not present

## 2023-06-23 DIAGNOSIS — R569 Unspecified convulsions: Secondary | ICD-10-CM | POA: Diagnosis not present

## 2023-06-23 DIAGNOSIS — I10 Essential (primary) hypertension: Secondary | ICD-10-CM | POA: Diagnosis not present

## 2023-06-23 DIAGNOSIS — C187 Malignant neoplasm of sigmoid colon: Secondary | ICD-10-CM | POA: Diagnosis not present

## 2023-06-23 DIAGNOSIS — M6281 Muscle weakness (generalized): Secondary | ICD-10-CM | POA: Diagnosis not present

## 2023-06-23 DIAGNOSIS — R262 Difficulty in walking, not elsewhere classified: Secondary | ICD-10-CM | POA: Diagnosis not present

## 2023-06-27 DIAGNOSIS — M6281 Muscle weakness (generalized): Secondary | ICD-10-CM | POA: Diagnosis not present

## 2023-06-27 DIAGNOSIS — C187 Malignant neoplasm of sigmoid colon: Secondary | ICD-10-CM | POA: Diagnosis not present

## 2023-06-27 DIAGNOSIS — R262 Difficulty in walking, not elsewhere classified: Secondary | ICD-10-CM | POA: Diagnosis not present

## 2023-06-28 DIAGNOSIS — R262 Difficulty in walking, not elsewhere classified: Secondary | ICD-10-CM | POA: Diagnosis not present

## 2023-06-28 DIAGNOSIS — M6281 Muscle weakness (generalized): Secondary | ICD-10-CM | POA: Diagnosis not present

## 2023-06-28 DIAGNOSIS — C187 Malignant neoplasm of sigmoid colon: Secondary | ICD-10-CM | POA: Diagnosis not present

## 2023-06-29 DIAGNOSIS — M6281 Muscle weakness (generalized): Secondary | ICD-10-CM | POA: Diagnosis not present

## 2023-06-29 DIAGNOSIS — R262 Difficulty in walking, not elsewhere classified: Secondary | ICD-10-CM | POA: Diagnosis not present

## 2023-06-29 DIAGNOSIS — C187 Malignant neoplasm of sigmoid colon: Secondary | ICD-10-CM | POA: Diagnosis not present

## 2023-06-30 DIAGNOSIS — M6281 Muscle weakness (generalized): Secondary | ICD-10-CM | POA: Diagnosis not present

## 2023-06-30 DIAGNOSIS — R262 Difficulty in walking, not elsewhere classified: Secondary | ICD-10-CM | POA: Diagnosis not present

## 2023-06-30 DIAGNOSIS — C187 Malignant neoplasm of sigmoid colon: Secondary | ICD-10-CM | POA: Diagnosis not present

## 2023-07-01 DIAGNOSIS — C187 Malignant neoplasm of sigmoid colon: Secondary | ICD-10-CM | POA: Diagnosis not present

## 2023-07-01 DIAGNOSIS — M6281 Muscle weakness (generalized): Secondary | ICD-10-CM | POA: Diagnosis not present

## 2023-07-01 DIAGNOSIS — R262 Difficulty in walking, not elsewhere classified: Secondary | ICD-10-CM | POA: Diagnosis not present

## 2023-07-03 DIAGNOSIS — C187 Malignant neoplasm of sigmoid colon: Secondary | ICD-10-CM | POA: Diagnosis not present

## 2023-07-03 DIAGNOSIS — M6281 Muscle weakness (generalized): Secondary | ICD-10-CM | POA: Diagnosis not present

## 2023-07-03 DIAGNOSIS — R262 Difficulty in walking, not elsewhere classified: Secondary | ICD-10-CM | POA: Diagnosis not present

## 2023-07-04 DIAGNOSIS — M6281 Muscle weakness (generalized): Secondary | ICD-10-CM | POA: Diagnosis not present

## 2023-07-04 DIAGNOSIS — C187 Malignant neoplasm of sigmoid colon: Secondary | ICD-10-CM | POA: Diagnosis not present

## 2023-07-04 DIAGNOSIS — R262 Difficulty in walking, not elsewhere classified: Secondary | ICD-10-CM | POA: Diagnosis not present

## 2023-07-05 DIAGNOSIS — C187 Malignant neoplasm of sigmoid colon: Secondary | ICD-10-CM | POA: Diagnosis not present

## 2023-07-05 DIAGNOSIS — M6281 Muscle weakness (generalized): Secondary | ICD-10-CM | POA: Diagnosis not present

## 2023-07-05 DIAGNOSIS — R262 Difficulty in walking, not elsewhere classified: Secondary | ICD-10-CM | POA: Diagnosis not present

## 2023-07-07 DIAGNOSIS — C187 Malignant neoplasm of sigmoid colon: Secondary | ICD-10-CM | POA: Diagnosis not present

## 2023-07-07 DIAGNOSIS — R262 Difficulty in walking, not elsewhere classified: Secondary | ICD-10-CM | POA: Diagnosis not present

## 2023-07-07 DIAGNOSIS — M6281 Muscle weakness (generalized): Secondary | ICD-10-CM | POA: Diagnosis not present

## 2023-07-08 DIAGNOSIS — R262 Difficulty in walking, not elsewhere classified: Secondary | ICD-10-CM | POA: Diagnosis not present

## 2023-07-08 DIAGNOSIS — M6281 Muscle weakness (generalized): Secondary | ICD-10-CM | POA: Diagnosis not present

## 2023-07-08 DIAGNOSIS — C187 Malignant neoplasm of sigmoid colon: Secondary | ICD-10-CM | POA: Diagnosis not present

## 2023-07-11 DIAGNOSIS — R262 Difficulty in walking, not elsewhere classified: Secondary | ICD-10-CM | POA: Diagnosis not present

## 2023-07-11 DIAGNOSIS — M6281 Muscle weakness (generalized): Secondary | ICD-10-CM | POA: Diagnosis not present

## 2023-07-11 DIAGNOSIS — C187 Malignant neoplasm of sigmoid colon: Secondary | ICD-10-CM | POA: Diagnosis not present

## 2023-07-12 DIAGNOSIS — C187 Malignant neoplasm of sigmoid colon: Secondary | ICD-10-CM | POA: Diagnosis not present

## 2023-07-12 DIAGNOSIS — M6281 Muscle weakness (generalized): Secondary | ICD-10-CM | POA: Diagnosis not present

## 2023-07-12 DIAGNOSIS — R262 Difficulty in walking, not elsewhere classified: Secondary | ICD-10-CM | POA: Diagnosis not present

## 2023-07-14 DIAGNOSIS — C187 Malignant neoplasm of sigmoid colon: Secondary | ICD-10-CM | POA: Diagnosis not present

## 2023-07-14 DIAGNOSIS — R262 Difficulty in walking, not elsewhere classified: Secondary | ICD-10-CM | POA: Diagnosis not present

## 2023-07-14 DIAGNOSIS — M6281 Muscle weakness (generalized): Secondary | ICD-10-CM | POA: Diagnosis not present

## 2023-07-15 DIAGNOSIS — C187 Malignant neoplasm of sigmoid colon: Secondary | ICD-10-CM | POA: Diagnosis not present

## 2023-07-15 DIAGNOSIS — M6281 Muscle weakness (generalized): Secondary | ICD-10-CM | POA: Diagnosis not present

## 2023-07-15 DIAGNOSIS — R262 Difficulty in walking, not elsewhere classified: Secondary | ICD-10-CM | POA: Diagnosis not present
